# Patient Record
Sex: Female | Born: 1955
Health system: Southern US, Community
[De-identification: ages and names within clinical notes are randomized; demographics above are authoritative.]

## PROBLEM LIST (undated history)

## (undated) DIAGNOSIS — J45909 Unspecified asthma, uncomplicated: Secondary | ICD-10-CM

## (undated) DIAGNOSIS — F419 Anxiety disorder, unspecified: Secondary | ICD-10-CM

## (undated) DIAGNOSIS — T7840XA Allergy, unspecified, initial encounter: Secondary | ICD-10-CM

## (undated) DIAGNOSIS — Z6740 Type O blood, Rh positive: Secondary | ICD-10-CM

## (undated) DIAGNOSIS — I10 Essential (primary) hypertension: Secondary | ICD-10-CM

## (undated) DIAGNOSIS — U071 COVID-19: Secondary | ICD-10-CM

## (undated) DIAGNOSIS — Z8619 Personal history of other infectious and parasitic diseases: Secondary | ICD-10-CM

## (undated) DIAGNOSIS — K449 Diaphragmatic hernia without obstruction or gangrene: Secondary | ICD-10-CM

## (undated) DIAGNOSIS — Z8719 Personal history of other diseases of the digestive system: Secondary | ICD-10-CM

## (undated) DIAGNOSIS — D62 Acute posthemorrhagic anemia: Secondary | ICD-10-CM

## (undated) DIAGNOSIS — K219 Gastro-esophageal reflux disease without esophagitis: Secondary | ICD-10-CM

## (undated) DIAGNOSIS — E785 Hyperlipidemia, unspecified: Secondary | ICD-10-CM

## (undated) DIAGNOSIS — D689 Coagulation defect, unspecified: Secondary | ICD-10-CM

## (undated) DIAGNOSIS — M1990 Unspecified osteoarthritis, unspecified site: Secondary | ICD-10-CM

## (undated) DIAGNOSIS — Z9289 Personal history of other medical treatment: Secondary | ICD-10-CM

## (undated) DIAGNOSIS — K5731 Diverticulosis of large intestine without perforation or abscess with bleeding: Secondary | ICD-10-CM

## (undated) DIAGNOSIS — K5792 Diverticulitis of intestine, part unspecified, without perforation or abscess without bleeding: Secondary | ICD-10-CM

## (undated) DIAGNOSIS — D72829 Elevated white blood cell count, unspecified: Secondary | ICD-10-CM

## (undated) DIAGNOSIS — K922 Gastrointestinal hemorrhage, unspecified: Secondary | ICD-10-CM

## (undated) HISTORY — PX: BREAST BIOPSY: SHX20

## (undated) HISTORY — DX: Unspecified asthma, uncomplicated: J45.909

## (undated) HISTORY — DX: Allergy, unspecified, initial encounter: T78.40XA

## (undated) HISTORY — DX: COVID-19: U07.1

## (undated) HISTORY — DX: Diverticulitis of intestine, part unspecified, without perforation or abscess without bleeding: K57.92

## (undated) HISTORY — DX: Diverticulosis of large intestine without perforation or abscess with bleeding: K57.31

## (undated) HISTORY — DX: Anxiety disorder, unspecified: F41.9

## (undated) HISTORY — DX: Gastro-esophageal reflux disease without esophagitis: K21.9

## (undated) HISTORY — DX: Personal history of other diseases of the digestive system: Z87.19

## (undated) HISTORY — DX: Hyperlipidemia, unspecified: E78.5

## (undated) HISTORY — DX: Diaphragmatic hernia without obstruction or gangrene: K44.9

## (undated) HISTORY — PX: BREAST CYST ASPIRATION: SHX578

## (undated) HISTORY — DX: Essential (primary) hypertension: I10

## (undated) HISTORY — DX: Type O blood, Rh positive: Z67.40

## (undated) HISTORY — DX: Unspecified osteoarthritis, unspecified site: M19.90

## (undated) HISTORY — DX: Coagulation defect, unspecified: D68.9

## (undated) HISTORY — PX: BREAST SURGERY: SHX581

## (undated) HISTORY — DX: Personal history of other medical treatment: Z92.89

## (undated) HISTORY — DX: Elevated white blood cell count, unspecified: D72.829

## (undated) HISTORY — DX: Personal history of other infectious and parasitic diseases: Z86.19

## (undated) HISTORY — DX: Gastrointestinal hemorrhage, unspecified: K92.2

## (undated) HISTORY — DX: Acute posthemorrhagic anemia: D62

## (undated) HISTORY — PX: ORIF ACETABULAR FRACTURE: SHX5029

## (undated) HISTORY — PX: COLONOSCOPY: SHX174

---

## 2014-05-11 DIAGNOSIS — J301 Allergic rhinitis due to pollen: Secondary | ICD-10-CM | POA: Insufficient documentation

## 2018-02-01 ENCOUNTER — Encounter: Payer: Self-pay | Admitting: Internal Medicine

## 2018-07-07 ENCOUNTER — Ambulatory Visit: Payer: Self-pay | Admitting: Internal Medicine

## 2018-07-07 ENCOUNTER — Encounter

## 2018-08-18 ENCOUNTER — Encounter: Payer: Self-pay | Admitting: Internal Medicine

## 2018-08-18 ENCOUNTER — Ambulatory Visit (INDEPENDENT_AMBULATORY_CARE_PROVIDER_SITE_OTHER): Payer: 59 | Admitting: Internal Medicine

## 2018-08-18 VITALS — BP 128/80 | HR 98 | Temp 98.1°F | Ht 64.0 in | Wt 229.8 lb

## 2018-08-18 DIAGNOSIS — E559 Vitamin D deficiency, unspecified: Secondary | ICD-10-CM

## 2018-08-18 DIAGNOSIS — Z1329 Encounter for screening for other suspected endocrine disorder: Secondary | ICD-10-CM

## 2018-08-18 DIAGNOSIS — I10 Essential (primary) hypertension: Secondary | ICD-10-CM | POA: Insufficient documentation

## 2018-08-18 DIAGNOSIS — R739 Hyperglycemia, unspecified: Secondary | ICD-10-CM | POA: Diagnosis not present

## 2018-08-18 DIAGNOSIS — K219 Gastro-esophageal reflux disease without esophagitis: Secondary | ICD-10-CM | POA: Insufficient documentation

## 2018-08-18 DIAGNOSIS — E785 Hyperlipidemia, unspecified: Secondary | ICD-10-CM | POA: Diagnosis not present

## 2018-08-18 DIAGNOSIS — Z0184 Encounter for antibody response examination: Secondary | ICD-10-CM

## 2018-08-18 DIAGNOSIS — J4520 Mild intermittent asthma, uncomplicated: Secondary | ICD-10-CM | POA: Diagnosis not present

## 2018-08-18 DIAGNOSIS — E611 Iron deficiency: Secondary | ICD-10-CM

## 2018-08-18 DIAGNOSIS — R6889 Other general symptoms and signs: Secondary | ICD-10-CM

## 2018-08-18 DIAGNOSIS — R600 Localized edema: Secondary | ICD-10-CM

## 2018-08-18 DIAGNOSIS — Z13818 Encounter for screening for other digestive system disorders: Secondary | ICD-10-CM

## 2018-08-18 DIAGNOSIS — B370 Candidal stomatitis: Secondary | ICD-10-CM

## 2018-08-18 DIAGNOSIS — Z1231 Encounter for screening mammogram for malignant neoplasm of breast: Secondary | ICD-10-CM | POA: Diagnosis not present

## 2018-08-18 DIAGNOSIS — J029 Acute pharyngitis, unspecified: Secondary | ICD-10-CM | POA: Diagnosis not present

## 2018-08-18 LAB — POC INFLUENZA A&B (BINAX/QUICKVUE)
Influenza A, POC: NEGATIVE
Influenza B, POC: NEGATIVE

## 2018-08-18 MED ORDER — DILTIAZEM HCL ER COATED BEADS 120 MG PO CP24: 120.0000 mg | ORAL_CAPSULE | Freq: Every day | ORAL | 3 refills | Status: DC

## 2018-08-18 MED ORDER — WIXELA INHUB 100-50 MCG/DOSE IN AEPB: 1.0000 | INHALATION_SPRAY | Freq: Two times a day (BID) | RESPIRATORY_TRACT | 12 refills | Status: DC

## 2018-08-18 MED ORDER — FUROSEMIDE 20 MG PO TABS: 10.0000 mg | ORAL_TABLET | Freq: Every day | ORAL | 1 refills | Status: DC | PRN

## 2018-08-18 MED ORDER — NYSTATIN 100000 UNIT/ML MT SUSP: 5.0000 mL | Freq: Four times a day (QID) | OROMUCOSAL | 0 refills | Status: DC

## 2018-08-18 MED ORDER — OMEPRAZOLE 40 MG PO CPDR: 40.0000 mg | DELAYED_RELEASE_CAPSULE | Freq: Every day | ORAL | 3 refills | Status: DC

## 2018-08-18 MED ORDER — FLUTICASONE-SALMETEROL 100-50 MCG/DOSE IN AEPB: 1.0000 | INHALATION_SPRAY | Freq: Two times a day (BID) | RESPIRATORY_TRACT | 12 refills | Status: DC

## 2018-08-18 MED ORDER — MONTELUKAST SODIUM 10 MG PO TABS: 10.0000 mg | ORAL_TABLET | Freq: Every day | ORAL | 3 refills | Status: DC

## 2018-08-18 MED ORDER — AZITHROMYCIN 250 MG PO TABS: ORAL_TABLET | ORAL | 0 refills | Status: DC

## 2018-08-18 MED ORDER — ALBUTEROL SULFATE HFA 108 (90 BASE) MCG/ACT IN AERS: 1.0000 | INHALATION_SPRAY | Freq: Four times a day (QID) | RESPIRATORY_TRACT | 11 refills | Status: DC | PRN

## 2018-08-18 NOTE — Progress Notes (Addendum)
Chief Complaint  Patient presents with  . Establish Care   New pt with husband 1. Sore throat, ear pain since Tuesday called out of work Weds feels achy, chills no fever, dry cough coworker sick at work tried [employer] blue label otc  2. Thrush painful itching tongue forgot to rinse mouth after steroid inhalers  3. HTN/HLD on Dilt 120 mg qd controlled today. C/w leg swelling in legs at times  4. GERD/hiatal hernia, h/o GIB on ppi needs refill  5. Asthma controlled needs refill of inhaler    Review of Systems  Constitutional: Positive for chills. Negative for fever and weight loss.  HENT: Positive for ear pain and sore throat.   Eyes: Negative for blurred vision.  Respiratory: Positive for cough. Negative for sputum production.   Cardiovascular: Negative for chest pain.  Gastrointestinal: Negative for abdominal pain.  Musculoskeletal: Negative for falls.  Skin: Negative for rash.  Neurological: Negative for headaches.  Psychiatric/Behavioral: Negative for depression.   Past Medical History:  Diagnosis Date  . Arthritis   . Asthma   . Diverticulitis   . GERD (gastroesophageal reflux disease)   . History of blood transfusion   . History of chicken pox   . History of GI bleed    2013/14  . Hyperlipidemia   . Hypertension    Past Surgical History:  Procedure Laterality Date  . BREAST SURGERY     bx dense breast nl per pt   . CESAREAN SECTION     04/27/84   Family History  Problem Relation Age of Onset  . Hypertension Maternal Aunt   . Diabetes Maternal Aunt    Social History   Socioeconomic History  . Marital status: Married    Spouse name: Not on file  . Number of children: Not on file  . Years of education: Not on file  . Highest education level: Not on file  Occupational History  . Not on file  Social Needs  . Financial resource strain: Not on file  . Food insecurity:    Worry: Not on file    Inability: Not on file  . Transportation needs:    Medical: Not  on file    Non-medical: Not on file  Tobacco Use  . Smoking status: Never Smoker  . Smokeless tobacco: Never Used  Substance and Sexual Activity  . Alcohol use: Not Currently  . Drug use: Not Currently  . Sexual activity: Yes    Comment: husband   Lifestyle  . Physical activity:    Days per week: Not on file    Minutes per session: Not on file  . Stress: Not on file  Relationships  . Social connections:    Talks on phone: Not on file    Gets together: Not on file    Attends religious service: Not on file    Active member of club or organization: Not on file    Attends meetings of clubs or organizations: Not on file    Relationship status: Not on file  . Intimate partner violence:    Fear of current or ex partner: Not on file    Emotionally abused: Not on file    Physically abused: Not on file    Forced sexual activity: Not on file  Other Topics Concern  . 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 albuterol (PROVENTIL HFA;VENTOLIN HFA) 108 (90 Base) MCG/ACT inhaler Inhale 1-2 puffs into the lungs every 6 (six) hours as needed.  . diltiazem (CARDIZEM CD) 120 MG 24 hr capsule Take 1 capsule (120 mg total) by mouth daily.  . Fluticasone-Salmeterol (ADVAIR) 100-50 MCG/DOSE AEPB Inhale 1 puff into the lungs 2 (two) times daily. Rinse mouth  . montelukast (SINGULAIR) 10 MG tablet Take 1 tablet (10 mg total) by mouth at bedtime.  Marland Kitchen. omeprazole (PRILOSEC) 40 MG capsule Take 1 capsule (40 mg total) by mouth daily. 30 min before food  . WIXELA INHUB 100-50 MCG/DOSE AEPB Inhale 1 puff into the lungs 2 (two) times daily. Rinse mouth  . [DISCONTINUED] albuterol (PROVENTIL HFA;VENTOLIN HFA) 108 (90 Base) MCG/ACT inhaler 2 puffs as needed.   . [DISCONTINUED] diltiazem (CARDIZEM CD) 120 MG 24 hr capsule daily.   . [DISCONTINUED]  Fluticasone-Salmeterol (ADVAIR) 100-50 MCG/DOSE AEPB Inhale 1 puff into the lungs 2 (two) times daily.  . [DISCONTINUED] montelukast (SINGULAIR) 10 MG tablet Take 10 mg by mouth at bedtime.   . [DISCONTINUED] omeprazole (PRILOSEC) 40 MG capsule   . [DISCONTINUED] WIXELA INHUB 100-50 MCG/DOSE AEPB Inhale 1 puff into the lungs 2 (two) times daily.    Allergies  Allergen Reactions  . Oxycodone Other (See Comments)   Recent Results (from the past 2160 hour(s))  POC Influenza A&B(BINAX/QUICKVUE)     Status: None   Collection Time: 08/18/18 11:27 AM  Result Value Ref Range   Influenza A, POC Negative Negative   Influenza B, POC Negative Negative   Objective  Body mass index is 39.45 kg/m. Wt Readings from Last 3 Encounters:  08/18/18 229 lb 12.8 oz (104.2 kg)   Temp Readings from Last 3 Encounters:  08/18/18 98.1 F (36.7 C) (Oral)   BP Readings from Last 3 Encounters:  08/18/18 128/80   Pulse Readings from Last 3 Encounters:  08/18/18 98    Physical Exam Vitals signs and nursing note reviewed.  Constitutional:      Appearance: Normal appearance. She is well-developed and well-groomed.  HENT:     Head: Normocephalic and atraumatic.     Nose: Nose normal.     Mouth/Throat:     Mouth: Mucous membranes are moist.     Pharynx: Oropharynx is clear.  Eyes:     Conjunctiva/sclera: Conjunctivae normal.     Pupils: Pupils are equal, round, and reactive to light.  Cardiovascular:     Rate and Rhythm: Normal rate and regular rhythm.     Heart sounds: Normal heart sounds.  Pulmonary:     Effort: Pulmonary effort is normal.     Breath sounds: Normal breath sounds.  Skin:    General: Skin is warm and dry.  Neurological:     General: No focal deficit present.     Mental Status: She is alert and oriented to person, place, and time. Mental status is at baseline.     Gait: Gait normal.  Psychiatric:        Attention and Perception: Attention and perception normal.        Mood and  Affect: Mood and affect normal.        Speech: Speech normal.        Behavior: Behavior normal. Behavior is cooperative.        Thought Content: Thought content normal.        Cognition and Memory: Cognition  and memory normal.        Judgment: Judgment normal.     Assessment   1. Sore throat, ear pain ? Viral vs bacterial flu negative today  2. Thrush with steroid inhaler use  3. HTN/HLD  4. GERD/hiatal hernia, h/o GIB 5. HM 6. Asthma controlled  Plan   1. Supportive care  Trial of Zpack  Warm salt gargles  rec claritin for pnd otc, mucinex dm  Note for work x 3 days.  2. Nystatin mouthwash  3. Cont meds  Add lasix 10 mg qd prn trace edema legs  4. Refilled meds  Get copy of GI records  5.  Flu shot had 2019 [place] in [place] Dr. Nedra HaiLee  [place]  [place]  Consider shingrix in future   [place] [facility] [place] but ordered mammo 02/2019  Colonoscopy get [place] obtained:  colonoscopy 10/15/06 mod to severe diverticulosis [facility] f/u in 5 years  EGD 11/29/07 candida, mild gastritis  EGD 01/03/13/colonoscopy diverticulosis ext and int hemorrhoids TI with ulceration neg H pylori UGD 02/01/18 gastritis   Pap get records [place] DEXA consider age 63  6. Refilled meds rec add claritin qhs prn   [facility] H/o TI ulceration on colonoscopy  H/o gastritis/duodenitis  GERD Diverticulosis  CAD-she was on lipitor 10 mg at 1 time HTN  Anxiety was on xanax 0.25 qid prn  H/o gallstones  Fibroids  Asthma H/o D&C, vaginal myomectomy  46 Endometrial bx, bening 05/21/2009 Knee tib/fib srews 1999  C section 04/25/1984  H/o left breat bx aspiration of cyst   Provider: Dr. French Anaracy [name]

## 2018-08-18 NOTE — Progress Notes (Signed)
Pre visit review using our clinic review tool, if applicable. No additional management support is needed unless otherwise documented below in the visit note. 

## 2018-08-18 NOTE — Patient Instructions (Signed)
Sore Throat  A sore throat is pain, burning, irritation, or scratchiness in the throat. When you have a sore throat, you may feel pain or tenderness in your throat when you swallow or talk.  Many things can cause a sore throat, including:   An infection.   Seasonal allergies.   Dryness in the air.   Irritants, such as smoke or pollution.   Radiation treatment to the area.   Gastroesophageal reflux disease (GERD).   A tumor.  A sore throat is often the first sign of another sickness. It may happen with other symptoms, such as coughing, sneezing, fever, and swollen neck glands. Most sore throats go away without medical treatment.  Follow these instructions at home:          Take over-the-counter medicines only as told by your health care provider.  ? If your child has a sore throat, do not give your child aspirin because of the association with Reye syndrome.   Drink enough fluids to keep your urine pale yellow.   Rest as needed.   To help with pain, try:  ? Sipping warm liquids, such as broth, herbal tea, or warm water.  ? Eating or drinking cold or frozen liquids, such as frozen ice pops.  ? Gargling with a salt-water mixture 3-4 times a day or as needed. To make a salt-water mixture, completely dissolve -1 tsp (3-6 g) of salt in 1 cup (237 mL) of warm water.  ? Sucking on hard candy or throat lozenges.  ? Putting a cool-mist humidifier in your bedroom at night to moisten the air.  ? Sitting in the bathroom with the door closed for 5-10 minutes while you run hot water in the shower.   Do not use any products that contain nicotine or tobacco, such as cigarettes, e-cigarettes, and chewing tobacco. If you need help quitting, ask your health care provider.   Wash your hands well and often with soap and water. If soap and water are not available, use hand sanitizer.  Contact a health care provider if:   You have a fever for more than 2-3 days.   You have symptoms that last (are persistent) for more than  2-3 days.   Your throat does not get better within 7 days.   You have a fever and your symptoms suddenly get worse.   Your child who is 3 months to 3 years old has a temperature of 102.2F (39C) or higher.  Get help right away if:   You have difficulty breathing.   You cannot swallow fluids, soft foods, or your saliva.   You have increased swelling in your throat or neck.   You have persistent nausea and vomiting.  Summary   A sore throat is pain, burning, irritation, or scratchiness in the throat. Many things can cause a sore throat.   Take over-the-counter medicines only as told by your health care provider. Do not give your child aspirin.   Drink plenty of fluids, and rest as needed.   Contact a health care provider if your symptoms worsen or your sore throat does not get better within 7 days.  This information is not intended to replace advice given to you by your health care provider. Make sure you discuss any questions you have with your health care provider.  Document Released: 08/13/2004 Document Revised: 12/06/2017 Document Reviewed: 12/06/2017  Elsevier Interactive Patient Education  2019 Elsevier Inc.

## 2018-08-23 ENCOUNTER — Encounter: Payer: Self-pay | Admitting: Internal Medicine

## 2018-08-29 ENCOUNTER — Other Ambulatory Visit (INDEPENDENT_AMBULATORY_CARE_PROVIDER_SITE_OTHER): Payer: 59

## 2018-08-29 DIAGNOSIS — R739 Hyperglycemia, unspecified: Secondary | ICD-10-CM

## 2018-08-29 DIAGNOSIS — E559 Vitamin D deficiency, unspecified: Secondary | ICD-10-CM

## 2018-08-29 DIAGNOSIS — Z1329 Encounter for screening for other suspected endocrine disorder: Secondary | ICD-10-CM | POA: Diagnosis not present

## 2018-08-29 DIAGNOSIS — E785 Hyperlipidemia, unspecified: Secondary | ICD-10-CM

## 2018-08-29 DIAGNOSIS — Z13818 Encounter for screening for other digestive system disorders: Secondary | ICD-10-CM

## 2018-08-29 DIAGNOSIS — I10 Essential (primary) hypertension: Secondary | ICD-10-CM

## 2018-08-29 DIAGNOSIS — E611 Iron deficiency: Secondary | ICD-10-CM

## 2018-08-29 DIAGNOSIS — Z0184 Encounter for antibody response examination: Secondary | ICD-10-CM

## 2018-08-29 LAB — COMPREHENSIVE METABOLIC PANEL WITH GFR
ALT: 13 U/L (ref 0–35)
AST: 13 U/L (ref 0–37)
Albumin: 4.1 g/dL (ref 3.5–5.2)
Alkaline Phosphatase: 63 U/L (ref 39–117)
BUN: 12 mg/dL (ref 6–23)
CO2: 27 meq/L (ref 19–32)
Calcium: 9.5 mg/dL (ref 8.4–10.5)
Chloride: 105 meq/L (ref 96–112)
Creatinine, Ser: 0.85 mg/dL (ref 0.40–1.20)
GFR: 67.31 mL/min
Glucose, Bld: 90 mg/dL (ref 70–99)
Potassium: 3.6 meq/L (ref 3.5–5.1)
Sodium: 140 meq/L (ref 135–145)
Total Bilirubin: 0.4 mg/dL (ref 0.2–1.2)
Total Protein: 8 g/dL (ref 6.0–8.3)

## 2018-08-29 LAB — CBC WITH DIFFERENTIAL/PLATELET
Basophils Absolute: 0.1 K/uL (ref 0.0–0.1)
Basophils Relative: 0.6 % (ref 0.0–3.0)
Eosinophils Absolute: 0.3 K/uL (ref 0.0–0.7)
Eosinophils Relative: 2.6 % (ref 0.0–5.0)
HCT: 41.4 % (ref 36.0–46.0)
Hemoglobin: 13.8 g/dL (ref 12.0–15.0)
Lymphocytes Relative: 40.7 % (ref 12.0–46.0)
Lymphs Abs: 4 K/uL (ref 0.7–4.0)
MCHC: 33.3 g/dL (ref 30.0–36.0)
MCV: 78.8 fl (ref 78.0–100.0)
Monocytes Absolute: 0.7 K/uL (ref 0.1–1.0)
Monocytes Relative: 7.3 % (ref 3.0–12.0)
Neutro Abs: 4.8 K/uL (ref 1.4–7.7)
Neutrophils Relative %: 48.8 % (ref 43.0–77.0)
Platelets: 252 K/uL (ref 150.0–400.0)
RBC: 5.26 Mil/uL — ABNORMAL HIGH (ref 3.87–5.11)
RDW: 16.8 % — ABNORMAL HIGH (ref 11.5–15.5)
WBC: 9.8 K/uL (ref 4.0–10.5)

## 2018-08-29 LAB — LIPID PANEL
Cholesterol: 207 mg/dL — ABNORMAL HIGH (ref 0–200)
HDL: 55.8 mg/dL
LDL Cholesterol: 134 mg/dL — ABNORMAL HIGH (ref 0–99)
NonHDL: 150.76
Total CHOL/HDL Ratio: 4
Triglycerides: 82 mg/dL (ref 0.0–149.0)
VLDL: 16.4 mg/dL (ref 0.0–40.0)

## 2018-08-29 LAB — VITAMIN D 25 HYDROXY (VIT D DEFICIENCY, FRACTURES): VITD: 29.96 ng/mL — ABNORMAL LOW (ref 30.00–100.00)

## 2018-08-29 LAB — HEMOGLOBIN A1C: Hgb A1c MFr Bld: 5.7 % (ref 4.6–6.5)

## 2018-08-29 LAB — TSH: TSH: 1.92 u[IU]/mL (ref 0.35–4.50)

## 2018-08-29 NOTE — Addendum Note (Signed)
Addended by: Penne Lash on: 08/29/2018 08:48 AM   Modules accepted: Orders

## 2018-08-30 ENCOUNTER — Other Ambulatory Visit: Payer: 59

## 2018-08-30 LAB — URINALYSIS, ROUTINE W REFLEX MICROSCOPIC
Bilirubin, UA: NEGATIVE
Glucose, UA: NEGATIVE
Ketones, UA: NEGATIVE
Leukocytes, UA: NEGATIVE
Nitrite, UA: NEGATIVE
RBC, UA: NEGATIVE
Specific Gravity, UA: 1.015 (ref 1.005–1.030)
Urobilinogen, Ur: 0.2 mg/dL (ref 0.2–1.0)
pH, UA: 6.5 (ref 5.0–7.5)

## 2018-08-30 LAB — MEASLES/MUMPS/RUBELLA IMMUNITY
Mumps IgG: 59.4 [AU]/ml
Rubella: 10 {index}
Rubeola IgG: >300 [AU]/ml

## 2018-08-30 LAB — MICROSCOPIC EXAMINATION
Bacteria, UA: NONE SEEN
Casts: NONE SEEN /LPF

## 2018-08-30 LAB — HEPATITIS C ANTIBODY
Hepatitis C Ab: NONREACTIVE
SIGNAL TO CUT-OFF: 0.07

## 2018-08-30 LAB — HEPATITIS B SURFACE ANTIBODY, QUANTITATIVE: Hep B S AB Quant (Post): >1000 m[IU]/mL

## 2018-08-30 LAB — IRON,TIBC AND FERRITIN PANEL
%SAT: 17 % (ref 16–45)
Ferritin: 66 ng/mL (ref 16–288)
Iron: 56 ug/dL (ref 45–160)
TIBC: 330 ug/dL (ref 250–450)

## 2018-08-31 ENCOUNTER — Other Ambulatory Visit: Payer: 59

## 2018-09-26 ENCOUNTER — Ambulatory Visit: Payer: 59 | Admitting: Family Medicine

## 2018-10-06 ENCOUNTER — Encounter: Payer: Self-pay | Admitting: Internal Medicine

## 2018-10-06 ENCOUNTER — Ambulatory Visit (INDEPENDENT_AMBULATORY_CARE_PROVIDER_SITE_OTHER): Payer: 59 | Admitting: Internal Medicine

## 2018-10-06 ENCOUNTER — Ambulatory Visit (INDEPENDENT_AMBULATORY_CARE_PROVIDER_SITE_OTHER): Payer: 59

## 2018-10-06 ENCOUNTER — Other Ambulatory Visit: Payer: Self-pay

## 2018-10-06 VITALS — BP 134/66 | HR 95 | Temp 98.4°F | Ht 64.0 in | Wt 230.2 lb

## 2018-10-06 DIAGNOSIS — R062 Wheezing: Secondary | ICD-10-CM | POA: Diagnosis not present

## 2018-10-06 DIAGNOSIS — E559 Vitamin D deficiency, unspecified: Secondary | ICD-10-CM | POA: Diagnosis not present

## 2018-10-06 DIAGNOSIS — R05 Cough: Secondary | ICD-10-CM

## 2018-10-06 DIAGNOSIS — E785 Hyperlipidemia, unspecified: Secondary | ICD-10-CM

## 2018-10-06 DIAGNOSIS — R7303 Prediabetes: Secondary | ICD-10-CM | POA: Diagnosis not present

## 2018-10-06 DIAGNOSIS — R059 Cough, unspecified: Secondary | ICD-10-CM

## 2018-10-06 DIAGNOSIS — J45998 Other asthma: Secondary | ICD-10-CM | POA: Diagnosis not present

## 2018-10-06 NOTE — Patient Instructions (Addendum)
Mucinex DM green label  Sugar free cough drops  Try Breo and for now Stop Wixela 100/50  If above does not help we can try to increase Advair to 250/50 and add spiriva to control your symptoms   Asthma, Adult  Asthma is a long-term (chronic) condition that causes recurrent episodes in which the airways become tight and narrow. The airways are the passages that lead from the nose and mouth down into the lungs. Asthma episodes, also called asthma attacks, can cause coughing, wheezing, shortness of breath, and chest pain. The airways can also fill with mucus. During an attack, it can be difficult to breathe. Asthma attacks can range from minor to life threatening. Asthma cannot be cured, but medicines and lifestyle changes can help control it and treat acute attacks. What are the causes? This condition is believed to be caused by inherited (genetic) and environmental factors, but its exact cause is not known. There are many things that can bring on an asthma attack or make asthma symptoms worse (triggers). Asthma triggers are different for each person. Common triggers include:  Mold.  Dust.  Cigarette smoke.  Cockroaches.  Things that can cause allergy symptoms (allergens), such as animal dander or pollen from trees or grass.  Air pollutants such as household cleaners, wood smoke, smog, or Therapist, occupational.  Cold air, weather changes, and winds (which increase molds and pollen in the air).  Strong emotional expressions such as crying or laughing hard.  Stress.  Certain medicines (such as aspirin) or types of medicines (such as beta-blockers).  Sulfites in foods and drinks. Foods and drinks that may contain sulfites include dried fruit, potato chips, and sparkling grape juice.  Infections or inflammatory conditions such as the flu, a cold, or inflammation of the nasal membranes (rhinitis).  Gastroesophageal reflux disease (GERD).  Exercise or strenuous activity. What are the signs  or symptoms? Symptoms of this condition may occur right after asthma is triggered or many hours later. Symptoms include:  Wheezing. This can sound like whistling when you breathe.  Excessive nighttime or early morning coughing.  Frequent or severe coughing with a common cold.  Chest tightness.  Shortness of breath.  Tiredness (fatigue) with minimal activity. How is this diagnosed? This condition is diagnosed based on:  Your medical history.  A physical exam.  Tests, which may include: ? Lung function studies and pulmonary studies (spirometry). These tests can evaluate the flow of air in your lungs. ? Allergy tests. ? Imaging tests, such as X-rays. How is this treated? There is no cure for this condition, but treatment can help control your symptoms. Treatment for asthma usually involves:  Identifying and avoiding your asthma triggers.  Using medicines to control your symptoms. Generally, two types of medicines are used to treat asthma: ? Controller medicines. These help prevent asthma symptoms from occurring. They are usually taken every day. ? Fast-acting reliever or rescue medicines. These quickly relieve asthma symptoms by widening the narrow and tight airways. They are used as needed and provide short-term relief.  Using supplemental oxygen. This may be needed during a severe episode.  Using other medicines, such as: ? Allergy medicines, such as antihistamines, if your asthma attacks are triggered by allergens. ? Immune medicines (immunomodulators). These are medicines that help control the immune system.  Creating an asthma action plan. An asthma action plan is a written plan for managing and treating your asthma attacks. This plan includes: ? A list of your asthma triggers and how to  avoid them. ? Information about when medicines should be taken and when their dosage should be changed. ? Instructions about using a device called a peak flow meter. A peak flow meter  measures how well the lungs are working and the severity of your asthma. It helps you monitor your condition. Follow these instructions at home: Controlling your home environment Control your home environment in the following ways to help avoid triggers and prevent asthma attacks:  Change your heating and air conditioning filter regularly.  Limit your use of fireplaces and wood stoves.  Get rid of pests (such as roaches and mice) and their droppings.  Throw away plants if you see mold on them.  Clean floors and dust surfaces regularly. Use unscented cleaning products.  Try to have someone else vacuum for you regularly. Stay out of rooms while they are being vacuumed and for a short while afterward. If you vacuum, use a dust mask from a hardware store, a double-layered or microfilter vacuum cleaner bag, or a vacuum cleaner with a HEPA filter.  Replace carpet with wood, tile, or vinyl flooring. Carpet can trap dander and dust.  Use allergy-proof pillows, mattress covers, and box spring covers.  Keep your bedroom a trigger-free room.  Avoid pets and keep windows closed when allergens are in the air.  Wash beddings every week in hot water and dry them in a dryer.  Use blankets that are made of polyester or cotton.  Clean bathrooms and kitchens with bleach. If possible, have someone repaint the walls in these rooms with mold-resistant paint. Stay out of the rooms that are being cleaned and painted.  Wash your hands often with soap and water. If soap and water are not available, use hand sanitizer.  Do not allow anyone to smoke in your home. General instructions  Take over-the-counter and prescription medicines only as told by your health care provider. ? Speak with your health care provider if you have questions about how or when to take the medicines. ? Make note if you are requiring more frequent dosages.  Do not use any products that contain nicotine or tobacco, such as  cigarettes and e-cigarettes. If you need help quitting, ask your health care provider. Also, avoid being exposed to secondhand smoke.  Use a peak flow meter as told by your health care provider. Record and keep track of the readings.  Understand and use the asthma action plan to help minimize, or stop an asthma attack, without needing to seek medical care.  Make sure you stay up to date on your yearly vaccinations as told by your health care provider. This may include vaccines for the flu and pneumonia.  Avoid outdoor activities when allergen counts are high and when air quality is low.  Wear a ski mask that covers your nose and mouth during outdoor winter activities. Exercise indoors on cold days if you can.  Warm up before exercising, and take time for a cool-down period after exercise.  Keep all follow-up visits as told by your health care provider. This is important. Where to find more information  For information about asthma, turn to the Centers for Disease Control and Prevention at http://www.mills-berg.com/.htm  For air quality information, turn to [employer] at [employer] Contact a health care provider if:  You have wheezing, shortness of breath, or a cough even while you are taking medicine to prevent attacks.  The mucus you cough up (sputum) is thicker than usual.  Your sputum changes from clear or white  to yellow, green, gray, or bloody.  Your medicines are causing side effects, such as a rash, itching, swelling, or trouble breathing.  You need to use a reliever medicine more than 2-3 times a week.  Your peak flow reading is still at 50-79% of your personal best after following your action plan for 1 hour.  You have a fever. Get help right away if:  You are getting worse and do not respond to treatment during an asthma attack.  You are short of breath when at rest or when doing very little physical activity.  You have difficulty eating, drinking, or  talking.  You have chest pain or tightness.  You develop a fast heartbeat or palpitations.  You have a bluish color to your lips or fingernails.  You are light-headed or dizzy, or you faint.  Your peak flow reading is less than 50% of your personal best.  You feel too tired to breathe normally. Summary  Asthma is a long-term (chronic) condition that causes recurrent episodes in which the airways become tight and narrow. These episodes can cause coughing, wheezing, shortness of breath, and chest pain.  Asthma cannot be cured, but medicines and lifestyle changes can help control it and treat acute attacks.  Make sure you understand how to avoid triggers and how and when to use your medicines.  Asthma attacks can range from minor to life threatening. Get help right away if you have an asthma attack and do not respond to treatment with your usual rescue medicines. This information is not intended to replace advice given to you by your health care provider. Make sure you discuss any questions you have with your health care provider. Document Released: 07/06/2005 Document Revised: 08/10/2016 Document Reviewed: 08/10/2016 Elsevier Interactive Patient Education  2019 Elsevier Inc.   Cough, Adult  Coughing is a reflex that clears your throat and your airways. Coughing helps to heal and protect your lungs. It is normal to cough occasionally, but a cough that happens with other symptoms or lasts a long time may be a sign of a condition that needs treatment. A cough may last only 2-3 weeks (acute), or it may last longer than 8 weeks (chronic). What are the causes? Coughing is commonly caused by:  Breathing in substances that irritate your lungs.  A viral or bacterial respiratory infection.  Allergies.  Asthma.  Postnasal drip.  Smoking.  Acid backing up from the stomach into the esophagus (gastroesophageal reflux).  Certain medicines.  Chronic lung problems, including COPD (or  rarely, lung cancer).  Other medical conditions such as heart failure. Follow these instructions at home: Pay attention to any changes in your symptoms. Take these actions to help with your discomfort:  Take medicines only as told by your health care provider. ? If you were prescribed an antibiotic medicine, take it as told by your health care provider. Do not stop taking the antibiotic even if you start to feel better. ? Talk with your health care provider before you take a cough suppressant medicine.  Drink enough fluid to keep your urine clear or pale yellow.  If the air is dry, use a cold steam vaporizer or humidifier in your bedroom or your home to help loosen secretions.  Avoid anything that causes you to cough at work or at home.  If your cough is worse at night, try sleeping in a semi-upright position.  Avoid cigarette smoke. If you smoke, quit smoking. If you need help quitting, ask your health care  provider.  Avoid caffeine.  Avoid alcohol.  Rest as needed. Contact a health care provider if:  You have new symptoms.  You cough up pus.  Your cough does not get better after 2-3 weeks, or your cough gets worse.  You cannot control your cough with suppressant medicines and you are losing sleep.  You develop pain that is getting worse or pain that is not controlled with pain medicines.  You have a fever.  You have unexplained weight loss.  You have night sweats. Get help right away if:  You cough up blood.  You have difficulty breathing.  Your heartbeat is very fast. This information is not intended to replace advice given to you by your health care provider. Make sure you discuss any questions you have with your health care provider. Document Released: 01/02/2011 Document Revised: 12/12/2015 Document Reviewed: 09/12/2014 Elsevier Interactive Patient Education  2019 [employer].

## 2018-10-06 NOTE — Progress Notes (Signed)
Pre visit review using our clinic review tool, if applicable. No additional management support is needed unless otherwise documented below in the visit note. 

## 2018-10-06 NOTE — Progress Notes (Signed)
No chief complaint on file.  Presents for f/u with husband today   1. C/o cough with phelgm clear thick phelgm productive at times c/o chest pressure. Cough is worse at night and wheezing at night and cough makes wheezing go away. She did have aches and saw [facility] walk in on 09/26/2018 flu was negative though she was given Tamiflu body aches better and no fever or sob. She is not on Advair 100/50 she is on Wixela due to insurance will not cover this for asthma and prn Albuterol inhaler. Also on singulair and c/o PND   2. Reviewed labs vitamin D def, HLD, prediabetes A1c 5.7     Review of Systems  Constitutional: Negative for weight loss.  HENT: Negative for hearing loss.   Eyes: Negative for blurred vision.  Respiratory: Negative for shortness of breath.   Cardiovascular: Negative for chest pain.  Gastrointestinal: Negative for abdominal pain.  Skin: Negative for rash.  Neurological: Negative for headaches.   Past Medical History:  Diagnosis Date  . Arthritis   . Asthma   . Diverticulitis   . GERD (gastroesophageal reflux disease)   . Hiatal hernia   . History of blood transfusion   . History of chicken pox   . History of GI bleed    2013/14  . Hyperlipidemia   . Hypertension    Past Surgical History:  Procedure Laterality Date  . BREAST SURGERY     bx dense breast nl per pt   . CESAREAN SECTION     04/27/84   Family History  Problem Relation Age of Onset  . Hypertension Maternal Aunt   . Diabetes Maternal Aunt    Social History   Socioeconomic History  . Marital status: Married    Spouse name: Not on file  . Number of children: Not on file  . Years of education: Not on file  . Highest education level: Not on file  Occupational History  . Not on file  Social Needs  . Financial resource strain: Not on file  . Food insecurity:    Worry: Not on file    Inability: Not on file  . Transportation needs:    Medical: Not on file    Non-medical: Not on file  Tobacco Use   . Smoking status: Never Smoker  . Smokeless tobacco: Never Used  Substance and Sexual Activity  . Alcohol use: Not Currently  . Drug use: Not Currently  . Sexual activity: Yes    Comment: husband   Lifestyle  . Physical activity:    Days per week: Not on file    Minutes per session: Not on file  . Stress: Not on file  Relationships  . Social connections:    Talks on phone: Not on file    Gets together: Not on file    Attends religious service: Not on file    Active member of club or organization: Not on file    Attends meetings of clubs or organizations: Not on file    Relationship status: Not on file  . Intimate partner violence:    Fear of current or ex partner: Not on file    Emotionally abused: Not on file    Physically abused: Not on file    Forced sexual activity: Not on file  Other Topics Concern  . Not on file  Social History Narrative   Lived in [place] from [place]    Married    2 sons  RN   No guns, wears seat belt, safe in relationship    No outpatient medications have been marked as taking for the 10/06/18 encounter (Appointment) with [name], [name], MD.   Allergies  Allergen Reactions  . Oxycodone Other (See Comments)   Recent Results (from the past 2160 hour(s))  POC Influenza A&B(BINAX/QUICKVUE)     Status: None   Collection Time: 08/18/18 11:27 AM  Result Value Ref Range   Influenza A, POC Negative Negative   Influenza B, POC Negative Negative  Urinalysis, Routine w reflex microscopic     Status: Abnormal   Collection Time: 08/29/18  8:48 AM  Result Value Ref Range   Specific Gravity, UA 1.015 1.005 - 1.030   pH, UA 6.5 5.0 - 7.5   Color, UA Yellow Yellow   Appearance Ur Clear Clear   Leukocytes, UA Negative Negative   Protein, UA 1+ (A) Negative/Trace   Glucose, UA Negative Negative   Ketones, UA Negative Negative   RBC, UA Negative Negative   Bilirubin, UA Negative Negative   Urobilinogen, Ur 0.2 0.2 - 1.0 mg/dL   Nitrite,  UA Negative Negative   Microscopic Examination See below:     Comment: Microscopic was indicated and was performed.  Iron, TIBC and Ferritin Panel     Status: None   Collection Time: 08/29/18  8:48 AM  Result Value Ref Range   Iron 56 45 - 160 mcg/dL   TIBC 168 372 - 902 mcg/dL (calc)   %SAT 17 16 - 45 % (calc)   Ferritin 66 16 - 288 ng/mL  Measles/Mumps/Rubella Immunity     Status: None   Collection Time: 08/29/18  8:48 AM  Result Value Ref Range   Rubeola IgG >300.00 AU/mL    Comment: AU/mL            Interpretation -----            -------------- <13.50           Negative 13.50-16.49      Equivocal >16.49           Positive . A positive result indicates that the patient has antibody to measles virus. It does not differentiate  between an active or past infection. The clinical  diagnosis must be interpreted in conjunction with  clinical signs and symptoms of the patient.    Mumps IgG 59.40 AU/mL    Comment:  AU/mL           Interpretation -------         ---------------- <9.00             Negative 9.00-10.99        Equivocal >10.99            Positive A positive result indicates that the patient has  antibody to mumps virus. It does not differentiate between an  active or past infection. The clinical diagnosis must be interpreted in conjunction with clinical signs and symptoms of the patient. .    Rubella 10.00 index    Comment:     Index            Interpretation     -----            --------------       <0.90            Not consistent with Immunity     0.90-0.99        Equivocal     > or =  1.00      Consistent with Immunity  . The presence of rubella IgG antibody suggests  immunization or past or current infection with rubella virus.   Hepatitis B surface antibody,quantitative     Status: None   Collection Time: 08/29/18  8:48 AM  Result Value Ref Range   Hepatitis B-Post >1,000 > OR = 10 mIU/mL    Comment: . Patient has immunity to hepatitis B  virus. . For additional information, please refer to http://education.[facility].com/faq/FAQ105 (This link is being provided for informational/ educational purposes only).   Hepatitis C antibody     Status: None   Collection Time: 08/29/18  8:48 AM  Result Value Ref Range   Hepatitis C Ab NON-REACTIVE NON-REACTI   SIGNAL TO CUT-OFF 0.07 <1.00    Comment: . HCV antibody was non-reactive. There is no laboratory  evidence of HCV infection. . In most cases, no further action is required. However, if recent HCV exposure is suspected, a test for HCV RNA (test code 00762) is suggested. . For additional information please refer to http://education.[facility].com/faq/FAQ22v1 (This link is being provided for informational/ educational purposes only.) .   Vitamin D (25 hydroxy)     Status: Abnormal   Collection Time: 08/29/18  8:48 AM  Result Value Ref Range   VITD 29.96 (L) 30.00 - 100.00 ng/mL  Hemoglobin A1c     Status: None   Collection Time: 08/29/18  8:48 AM  Result Value Ref Range   Hgb A1c MFr Bld 5.7 4.6 - 6.5 %    Comment: Glycemic Control Guidelines for People with Diabetes:Non Diabetic:  <6%Goal of Therapy: <7%Additional Action Suggested:  >8%   TSH     Status: None   Collection Time: 08/29/18  8:48 AM  Result Value Ref Range   TSH 1.92 0.35 - 4.50 uIU/mL  Lipid panel     Status: Abnormal   Collection Time: 08/29/18  8:48 AM  Result Value Ref Range   Cholesterol 207 (H) 0 - 200 mg/dL    Comment: ATP III Classification       Desirable:  < 200 mg/dL               Borderline High:  200 - 239 mg/dL          High:  > = 263 mg/dL   Triglycerides 33.5 0.0 - 149.0 mg/dL    Comment: Normal:  <456 mg/dLBorderline High:  150 - 199 mg/dL   HDL 25.63 >89.37 mg/dL   VLDL 34.2 0.0 - 87.6 mg/dL   LDL Cholesterol 811 (H) 0 - 99 mg/dL   Total CHOL/HDL Ratio 4     Comment:                Men          Women1/2 Average Risk     3.4          3.3Average Risk          5.0           4.42X Average Risk          9.6          7.13X Average Risk          15.0          11.0                       NonHDL 150.76     Comment: NOTE:  Non-HDL goal should be 30  mg/dL higher than patient's LDL goal (i.e. LDL goal of < 70 mg/dL, would have non-HDL goal of < 100 mg/dL)  CBC with Differential/Platelet     Status: Abnormal   Collection Time: 08/29/18  8:48 AM  Result Value Ref Range   WBC 9.8 4.0 - 10.5 K/uL   RBC 5.26 (H) 3.87 - 5.11 Mil/uL   Hemoglobin 13.8 12.0 - 15.0 g/dL   HCT 16.141.4 09.636.0 - 04.546.0 %   MCV 78.8 78.0 - 100.0 fl   MCHC 33.3 30.0 - 36.0 g/dL   RDW 40.916.8 (H) 81.111.5 - 91.415.5 %   Platelets 252.0 150.0 - 400.0 K/uL   Neutrophils Relative % 48.8 43.0 - 77.0 %   Lymphocytes Relative 40.7 12.0 - 46.0 %   Monocytes Relative 7.3 3.0 - 12.0 %   Eosinophils Relative 2.6 0.0 - 5.0 %   Basophils Relative 0.6 0.0 - 3.0 %   Neutro Abs 4.8 1.4 - 7.7 K/uL   Lymphs Abs 4.0 0.7 - 4.0 K/uL   Monocytes Absolute 0.7 0.1 - 1.0 K/uL   Eosinophils Absolute 0.3 0.0 - 0.7 K/uL   Basophils Absolute 0.1 0.0 - 0.1 K/uL  Comprehensive metabolic panel     Status: None   Collection Time: 08/29/18  8:48 AM  Result Value Ref Range   Sodium 140 135 - 145 mEq/L   Potassium 3.6 3.5 - 5.1 mEq/L   Chloride 105 96 - 112 mEq/L   CO2 27 19 - 32 mEq/L   Glucose, Bld 90 70 - 99 mg/dL   BUN 12 6 - 23 mg/dL   Creatinine, Ser 7.820.85 0.40 - 1.20 mg/dL   Total Bilirubin 0.4 0.2 - 1.2 mg/dL   Alkaline Phosphatase 63 39 - 117 U/L   AST 13 0 - 37 U/L   ALT 13 0 - 35 U/L   Total Protein 8.0 6.0 - 8.3 g/dL   Albumin 4.1 3.5 - 5.2 g/dL   Calcium 9.5 8.4 - 95.610.5 mg/dL   GFR 21.3067.31 >86.57>60.00 mL/min  Microscopic Examination     Status: None   Collection Time: 08/29/18  8:48 AM  Result Value Ref Range   WBC, UA 0-5 0 - 5 /hpf   RBC, UA 0-2 0 - 2 /hpf   Epithelial Cells (non renal) 0-10 0 - 10 /hpf   Casts None seen None seen /lpf   Mucus, UA Present Not Estab.   Bacteria, UA None seen None seen/Few   Objective   There is no height or weight on file to calculate BMI. Wt Readings from Last 3 Encounters:  08/18/18 229 lb 12.8 oz (104.2 kg)   Temp Readings from Last 3 Encounters:  08/18/18 98.1 F (36.7 C) (Oral)   BP Readings from Last 3 Encounters:  08/18/18 128/80   Pulse Readings from Last 3 Encounters:  08/18/18 98    Physical Exam Vitals signs and nursing note reviewed.  Constitutional:      Appearance: Normal appearance. She is well-developed and well-groomed.  HENT:     Head: Normocephalic and atraumatic.     Nose: Nose normal.     Mouth/Throat:     Mouth: Mucous membranes are moist.     Pharynx: Oropharynx is clear.  Eyes:     Conjunctiva/sclera: Conjunctivae normal.     Pupils: Pupils are equal, round, and reactive to light.  Cardiovascular:     Rate and Rhythm: Normal rate and regular rhythm.     Heart sounds: Normal heart sounds. No murmur.  Pulmonary:     Effort: Pulmonary effort is normal.     Breath sounds: Normal breath sounds. No wheezing.  Skin:    General: Skin is warm and dry.  Neurological:     General: No focal deficit present.     Mental Status: She is alert and oriented to person, place, and time. Mental status is at baseline.     Gait: Gait normal.  Psychiatric:        Attention and Perception: Attention and perception normal.        Mood and Affect: Mood and affect normal.        Speech: Speech normal.        Behavior: Behavior normal. Behavior is cooperative.        Thought Content: Thought content normal.        Cognition and Memory: Cognition and memory normal.        Judgment: Judgment normal.     Assessment   1. Asthma with PND with persistent sx's cough and wheezing worse at night  2. HLD 3. Vitamin D def 4. prediabetes  5. HM Plan   1. CXR today  Stop Wixela  Given sample of breo 100/25  -if does not help consider increase advair to 250/50 and add spiriva  On claritin  Consider astepro nasal spray as well for allergies  2. Given  cholesterol handout  3. On otc D3 1000-2000 IU qd  4. rec healthy diet and exercise  5.  Flu shot had 2019 PCP in [place] Dr. Nedra Hai  [place]  [place]  Consider shingrix in future   Get records [place] but ordered mammo 02/2019  Colonoscopy get records [place] obtained:  colonoscopy 10/15/06 mod to severe diverticulosis [facility] f/u in 5 years  EGD 11/29/07 candida, mild gastritis  EGD 01/03/13/colonoscopy diverticulosis ext and int hemorrhoids TI with ulceration neg H pylori UGD 02/01/18 gastritis   Pap get records [place] DEXA consider age 48 Provider: Dr. French Ana [name]-Internal Medicine

## 2018-10-07 ENCOUNTER — Other Ambulatory Visit: Payer: Self-pay | Admitting: Internal Medicine

## 2018-10-07 DIAGNOSIS — J4530 Mild persistent asthma, uncomplicated: Secondary | ICD-10-CM

## 2018-10-07 MED ORDER — FLUTICASONE FUROATE-VILANTEROL 100-25 MCG/INH IN AEPB: 1.0000 | INHALATION_SPRAY | Freq: Every day | RESPIRATORY_TRACT | 12 refills | Status: DC

## 2018-10-14 ENCOUNTER — Telehealth: Payer: Self-pay | Admitting: Internal Medicine

## 2018-10-14 NOTE — Telephone Encounter (Signed)
Samples are limited for now  Sent the Rx bre[facility] 10/07/2018  Thanks TMS

## 2018-10-14 NOTE — Telephone Encounter (Signed)
Copied from CRM [id number]. Topic: General - Other >> Oct 14, 2018  9:38 AM Leafy Ro wrote: Reason for CRM: pt was given a sample of breo 100/25 and the med works good. Pt only has 2 day worth of med. Pt would like to come pick another sample  up or md can sent new rx to [facility] [place] on [address]

## 2018-10-19 ENCOUNTER — Other Ambulatory Visit: Payer: Self-pay

## 2018-10-19 ENCOUNTER — Ambulatory Visit: Payer: 59 | Admitting: Internal Medicine

## 2018-10-19 ENCOUNTER — Ambulatory Visit (INDEPENDENT_AMBULATORY_CARE_PROVIDER_SITE_OTHER): Payer: 59 | Admitting: Internal Medicine

## 2018-10-19 DIAGNOSIS — R002 Palpitations: Secondary | ICD-10-CM | POA: Diagnosis not present

## 2018-10-19 DIAGNOSIS — J4520 Mild intermittent asthma, uncomplicated: Secondary | ICD-10-CM | POA: Diagnosis not present

## 2018-10-19 NOTE — Progress Notes (Signed)
Virtual Visit via Video Note  I connected with Cheryl Coleman on 10/19/18 at  8:46-9:02 AM EDT by a video enabled telemedicine application and verified that I am speaking with the correct person using two identifiers.  Location patient: home Location provider:work  Persons participating in the virtual visit: patient, provider  I discussed the limitations of evaluation and management by telemedicine and the availability of in person appointments. The patient expressed understanding and agreed to proceed.   HPI: F/u  Asthma controlled breo 100/25 1 puff 1x per day helping more than Wixela. She is now able to run up the stairs and she is not out of breath where before walking up the steps made her feel sob.   Palpitations cardiologist was in [place] on Diltiazem she feels a times her heart races and does not feel anxious but notices with caffeine   ROS: See pertinent positives and negatives per HPI.  Past Medical History:  Diagnosis Date  . Arthritis   . Asthma   . Diverticulitis   . GERD (gastroesophageal reflux disease)   . Hiatal hernia   . History of blood transfusion   . History of chicken pox   . History of GI bleed    2013/14  . Hyperlipidemia   . Hypertension     Past Surgical History:  Procedure Laterality Date  . BREAST SURGERY     bx dense breast nl per pt   . CESAREAN SECTION     04/27/84    Family History  Problem Relation Age of Onset  . Hypertension Maternal Aunt   . Diabetes Maternal Aunt     SOCIAL HX: married with kids    Current Outpatient Medications:  .  albuterol (PROVENTIL HFA;VENTOLIN HFA) 108 (90 Base) MCG/ACT inhaler, Inhale 1-2 puffs into the lungs every 6 (six) hours as needed., Disp: 1 Inhaler, Rfl: 11 .  azithromycin (ZITHROMAX) 250 MG tablet, 2 pills day 1 and 1 pill day 2-5, Disp: 6 tablet, Rfl: 0 .  diltiazem (CARDIZEM CD) 120 MG 24 hr capsule, Take 1 capsule (120 mg total) by mouth daily., Disp: 90 capsule, Rfl: 3 .  fluticasone  furoate-vilanterol (BREO ELLIPTA) 100-25 MCG/INH AEPB, Inhale 1 puff into the lungs daily. Rinse mouth, Disp: 60 each, Rfl: 12 .  furosemide (LASIX) 20 MG tablet, Take 0.5 tablets (10 mg total) by mouth daily as needed., Disp: 30 tablet, Rfl: 1 .  montelukast (SINGULAIR) 10 MG tablet, Take 1 tablet (10 mg total) by mouth at bedtime., Disp: 90 tablet, Rfl: 3 .  nystatin (MYCOSTATIN) 100000 UNIT/ML suspension, Take 5 mLs (500,000 Units total) by mouth 4 (four) times daily. X 7-10 days, Disp: 120 mL, Rfl: 0 .  omeprazole (PRILOSEC) 40 MG capsule, Take 1 capsule (40 mg total) by mouth daily. 30 min before food, Disp: 90 capsule, Rfl: 3  EXAM:  VITALS per patient if applicable:  GENERAL: alert, oriented, appears well and in no acute distress  HEENT: atraumatic, conjunttiva clear, no obvious abnormalities on inspection of external nose and ears  NECK: normal movements of the head and neck  LUNGS: on inspection no signs of respiratory distress, breathing rate appears normal, no obvious gross SOB, gasping or wheezing  CV: no obvious cyanosis  MS: moves all visible extremities without noticeable abnormality  PSYCH/NEURO: pleasant and cooperative, no obvious depression or anxiety, speech and thought processing grossly intact  ASSESSMENT AND PLAN:  Discussed the following assessment and plan:  Mild intermittent asthma, unspecified whether complicated-cont Breo 100/25 1  puff 1x per day   Palpitations - Plan: Ambulatory referral to Cardiology, continue Diltiazem for now 120 24 hrs qd     I discussed the assessment and treatment plan with the patient. The patient was provided an opportunity to ask questions and all were answered. The patient agreed with the plan and demonstrated an understanding of the instructions.   The patient was advised to call back or seek an in-person evaluation if the symptoms worsen or if the condition fails to improve as anticipated.  I provided 15 minutes of  non-face-to-face time during this encounter.   [name] [name], MD

## 2018-10-26 ENCOUNTER — Telehealth: Payer: Self-pay | Admitting: Internal Medicine

## 2018-10-26 NOTE — Telephone Encounter (Signed)
sch f/u 10 or 05/2019  Thanks TMS 

## 2018-11-03 ENCOUNTER — Encounter: Payer: Self-pay | Admitting: Internal Medicine

## 2018-11-03 ENCOUNTER — Ambulatory Visit (INDEPENDENT_AMBULATORY_CARE_PROVIDER_SITE_OTHER): Payer: 59 | Admitting: Internal Medicine

## 2018-11-03 DIAGNOSIS — B351 Tinea unguium: Secondary | ICD-10-CM

## 2018-11-03 DIAGNOSIS — L03031 Cellulitis of right toe: Secondary | ICD-10-CM

## 2018-11-03 MED ORDER — CICLOPIROX 8 % EX SOLN: Freq: Every day | CUTANEOUS | 5 refills | Status: DC

## 2018-11-03 MED ORDER — MUPIROCIN 2 % EX OINT: 1.0000 "application " | TOPICAL_OINTMENT | Freq: Two times a day (BID) | CUTANEOUS | 0 refills | Status: DC

## 2018-11-03 NOTE — Progress Notes (Signed)
Telephone Note  I connected with Cheryl Coleman  on 11/03/18 at  8:13 AM EDT by telephone and verified that I am speaking with the correct person using two identifiers.  Location patient: home Location provider:work  Persons participating in the virtual visit: patient, provider  I discussed the limitations of evaluation and management by telemedicine and the availability of in person appointments. The patient expressed understanding and agreed to proceed.   HPI: C/o right great toe dark, fragile nail ws a black streak now more spread to the toenail and grey color she has had this since getting a pedicure a while ago tried otc antifungals w/o help surrounding skin itchy and toe was painful and sore to touch. She does not see an ingrown nail or drainage or redness or warmth    ROS: See pertinent positives and negatives per HPI.  Past Medical History:  Diagnosis Date  . Arthritis   . Asthma   . Diverticulitis   . GERD (gastroesophageal reflux disease)   . Hiatal hernia   . History of blood transfusion   . History of chicken pox   . History of GI bleed    2013/14  . Hyperlipidemia   . Hypertension     Past Surgical History:  Procedure Laterality Date  . BREAST SURGERY     bx dense breast nl per pt   . CESAREAN SECTION     04/27/84    Family History  Problem Relation Age of Onset  . Hypertension Maternal Aunt   . Diabetes Maternal Aunt     SOCIAL HX: married    Current Outpatient Medications:  .  albuterol (PROVENTIL HFA;VENTOLIN HFA) 108 (90 Base) MCG/ACT inhaler, Inhale 1-2 puffs into the lungs every 6 (six) hours as needed., Disp: 1 Inhaler, Rfl: 11 .  azithromycin (ZITHROMAX) 250 MG tablet, 2 pills day 1 and 1 pill day 2-5, Disp: 6 tablet, Rfl: 0 .  ciclopirox (PENLAC) 8 % solution, Apply topically at bedtime. Apply over nail and surrounding skin. Apply daily over previous coat. After seven (7) days, may remove with alcohol and continue cycle use up to 48 weeks, Disp:  6.6 mL, Rfl: 5 .  diltiazem (CARDIZEM CD) 120 MG 24 hr capsule, Take 1 capsule (120 mg total) by mouth daily., Disp: 90 capsule, Rfl: 3 .  fluticasone furoate-vilanterol (BREO ELLIPTA) 100-25 MCG/INH AEPB, Inhale 1 puff into the lungs daily. Rinse mouth, Disp: 60 each, Rfl: 12 .  furosemide (LASIX) 20 MG tablet, Take 0.5 tablets (10 mg total) by mouth daily as needed., Disp: 30 tablet, Rfl: 1 .  montelukast (SINGULAIR) 10 MG tablet, Take 1 tablet (10 mg total) by mouth at bedtime., Disp: 90 tablet, Rfl: 3 .  mupirocin ointment (BACTROBAN) 2 %, Place 1 application into the nose 2 (two) times daily. Skin around toe, Disp: 30 g, Rfl: 0 .  nystatin (MYCOSTATIN) 100000 UNIT/ML suspension, Take 5 mLs (500,000 Units total) by mouth 4 (four) times daily. X 7-10 days, Disp: 120 mL, Rfl: 0 .  omeprazole (PRILOSEC) 40 MG capsule, Take 1 capsule (40 mg total) by mouth daily. 30 min before food, Disp: 90 capsule, Rfl: 3  EXAM:  VITALS per patient if applicable:  GENERAL: alert, oriented, appears well and in no acute distress  PSYCH/NEURO: pleasant and cooperative, no obvious depression or anxiety, speech and thought processing grossly intact  SKIN: per pt right great toenail discolored   ASSESSMENT AND PLAN:  Discussed the following assessment and plan:  Paronychia of great  toe, right - Plan: mupirocin ointment (BACTROBAN) 2 %, ciclopirox (PENLAC) 8 % solution  Onychomycosis - Plan: mupirocin ointment (BACTROBAN) 2 %, ciclopirox (PENLAC) 8 % solution q7 days x 48 weeks  Reviewed oral lamisil but she declines  Call back in 1 month to see how doing  Warm soaks foot with antibacterial soap  My chart photo of toe since camera not working    I discussed the assessment and treatment plan with the patient. The patient was provided an opportunity to ask questions and all were answered. The patient agreed with the plan and demonstrated an understanding of the instructions.   The patient was advised to  call back or seek an in-person evaluation if the symptoms worsen or if the condition fails to improve as anticipated.  Time spent 12 minutes  Bevelyn Bucklesracy N [name], MD

## 2018-11-03 NOTE — Patient Instructions (Signed)
Fungal Nail Infection A fungal nail infection is a common infection of the toenails or fingernails. This condition affects toenails more often than fingernails. It often affects the great, or big, toes. More than one nail may be infected. The condition can be passed from person to person (is contagious). What are the causes? This condition is caused by a fungus. Several types of fungi can cause the infection. These fungi are common in moist and warm areas. If your hands or feet come into contact with the fungus, it may get into a crack in your fingernail or toenail and cause the infection. What increases the risk? The following factors may make you more likely to develop this condition:  Being female.  Being of older age.  Living with someone who has the fungus.  Walking barefoot in areas where the fungus thrives, such as showers or locker rooms.  Wearing shoes and socks that cause your feet to sweat.  Having a nail injury or a recent nail surgery.  Having certain medical conditions, such as: ? Athlete's foot. ? Diabetes. ? Psoriasis. ? Poor circulation. ? A weak body defense system (immune system). What are the signs or symptoms? Symptoms of this condition include:  A pale spot on the nail.  Thickening of the nail.  A nail that becomes yellow or brown.  A brittle or ragged nail edge.  A crumbling nail.  A nail that has lifted away from the nail bed. How is this diagnosed? This condition is diagnosed with a physical exam. Your health care provider may take a scraping or clipping from your nail to test for the fungus. How is this treated? Treatment is not needed for mild infections. If you have significant nail changes, treatment may include:  Antifungal medicines taken by mouth (orally). You may need to take the medicine for several weeks or several months, and you may not see the results for a long time. These medicines can cause side effects. Ask your health care provider  what problems to watch for.  Antifungal nail polish or nail cream. These may be used along with oral antifungal medicines.  Laser treatment of the nail.  Surgery to remove the nail. This may be needed for the most severe infections. It can take a long time, usually up to a year, for the infection to go away. The infection may also come back. Follow these instructions at home: Medicines  Take or apply over-the-counter and prescription medicines only as told by your health care provider.  Ask your health care provider about using over-the-counter mentholated ointment on your nails. Nail care  Trim your nails often.  Wash and dry your hands and feet every day.  Keep your feet dry: ? Wear absorbent socks, and change your socks frequently. ? Wear shoes that allow air to circulate, such as sandals or canvas tennis shoes. Throw out old shoes.  Do not use artificial nails.  If you go to a nail salon, make sure you choose one that uses clean instruments.  Use antifungal foot powder on your feet and in your shoes. General instructions  Do not share personal items, such as towels or nail clippers.  Do not walk barefoot in shower rooms or locker rooms.  Wear rubber gloves if you are working with your hands in wet areas.  Keep all follow-up visits as told by your health care provider. This is important. Contact a health care provider if: Your infection is not getting better or it is getting worse   after several months. Summary  A fungal nail infection is a common infection of the toenails or fingernails.  Treatment is not needed for mild infections. If you have significant nail changes, treatment may include taking medicine orally and applying medicine to your nails.  It can take a long time, usually up to a year, for the infection to go away. The infection may also come back.  Take or apply over-the-counter and prescription medicines only as told by your health care provider.   Follow instructions for taking care of your nails to help prevent infection from coming back or spreading. This information is not intended to replace advice given to you by your health care provider. Make sure you discuss any questions you have with your health care provider. Document Released: 07/03/2000 Document Revised: 12/10/2017 Document Reviewed: 12/10/2017 Elsevier Interactive Patient Education  2019 [employer]  Paronychia Paronychia is an infection of the skin that surrounds a nail. It usually affects the skin around a fingernail, but it may also occur near a toenail. It often causes pain and swelling around the nail. In some cases, a collection of pus (abscess) can form near or under the nail.  This condition may develop suddenly, or it may develop gradually over a longer period. In most cases, paronychia is not serious, and it will clear up with treatment. What are the causes? This condition may be caused by bacteria or a fungus. These germs can enter the body through an opening in the skin, such as a cut or a hangnail. What increases the risk? This condition is more likely to develop in people who:  Get their hands wet often, such as those who work as Fish farm manager, bartenders, or nurses.  Bite their fingernails or suck their thumbs.  Trim their nails very short.  Have hangnails or injured fingertips.  Get manicures.  Have diabetes. What are the signs or symptoms? Symptoms of this condition include:  Redness and swelling of the skin near the nail.  Tenderness around the nail when you touch the area.  Pus-filled bumps under the skin at the base and sides of the nail (cuticle).  Fluid or pus under the nail.  Throbbing pain in the area. How is this diagnosed? This condition is diagnosed with a physical exam. In some cases, a sample of pus may be tested to determine what type of bacteria or fungus is causing the condition. How is this treated? Treatment depends on the  cause and severity of your condition. If your condition is mild, it may clear up on its own in a few days or after soaking in warm water. If needed, treatment may include:  Antibiotic medicine, if your infection is caused by bacteria.  Antifungal medicine, if your infection is caused by a fungus.  A procedure to drain pus from an abscess.  Anti-inflammatory medicine (corticosteroids). Follow these instructions at home: Wound care  Keep the affected area clean.  Soak the affected area in warm water, if told to do so by your health care provider. You may be told to do this for 20 minutes, 2-3 times a day.  Keep the area dry when you are not soaking it.  Do not try to drain an abscess yourself.  Follow instructions from your health care provider about how to take care of the affected area. Make sure you: ? Wash your hands with soap and water before you change your bandage (dressing). If soap and water are not available, use hand sanitizer. ? Change  your dressing as told by your health care provider.  If you had an abscess drained, check the area every day for signs of infection. Check for: ? Redness, swelling, or pain. ? Fluid or blood. ? Warmth. ? Pus or a bad smell. Medicines   Take over-the-counter and prescription medicines only as told by your health care provider.  If you were prescribed an antibiotic medicine, take it as told by your health care provider. Do not stop taking the antibiotic even if you start to feel better. General instructions  Avoid contact with harsh chemicals.  Do not pick at the affected area. Prevention  To prevent this condition from happening again: ? Wear rubber gloves when washing dishes or doing other tasks that require your hands to get wet. ? Wear gloves if your hands might come in contact with cleaners or other chemicals. ? Avoid injuring your nails or fingertips. ? Do not bite your nails or tear hangnails. ? Do not cut your nails very  short. ? Do not cut your cuticles. ? Use clean nail clippers or scissors when trimming nails. Contact a health care provider if:  Your symptoms get worse or do not improve with treatment.  You have continued or increased fluid, blood, or pus coming from the affected area.  Your finger or knuckle becomes swollen or difficult to move. Get help right away if you have:  A fever or chills.  Redness spreading away from the affected area.  Joint or muscle pain. Summary  Paronychia is an infection of the skin that surrounds a nail. It often causes pain and swelling around the nail. In some cases, a collection of pus (abscess) can form near or under the nail.  This condition may be caused by bacteria or a fungus. These germs can enter the body through an opening in the skin, such as a cut or a hangnail.  If your condition is mild, it may clear up on its own in a few days. If needed, treatment may include medicine or a procedure to drain pus from an abscess.  To prevent this condition from happening again, wear gloves if doing tasks that require your hands to get wet or to come in contact with chemicals. Also avoid injuring your nails or fingertips. This information is not intended to replace advice given to you by your health care provider. Make sure you discuss any questions you have with your health care provider. Document Released: 12/30/2000 Document Revised: 07/19/2017 Document Reviewed: 07/19/2017 Elsevier Interactive Patient Education  2019 [employer]

## 2018-11-08 ENCOUNTER — Other Ambulatory Visit: Payer: Self-pay | Admitting: Internal Medicine

## 2018-11-08 DIAGNOSIS — I10 Essential (primary) hypertension: Secondary | ICD-10-CM

## 2018-11-08 DIAGNOSIS — R600 Localized edema: Secondary | ICD-10-CM

## 2018-11-08 MED ORDER — FUROSEMIDE 20 MG PO TABS: 10.0000 mg | ORAL_TABLET | Freq: Every day | ORAL | 5 refills | Status: DC | PRN

## 2018-11-25 ENCOUNTER — Encounter: Payer: Self-pay | Admitting: Internal Medicine

## 2018-11-25 ENCOUNTER — Ambulatory Visit (INDEPENDENT_AMBULATORY_CARE_PROVIDER_SITE_OTHER): Payer: 59 | Admitting: Internal Medicine

## 2018-11-25 ENCOUNTER — Other Ambulatory Visit: Payer: Self-pay

## 2018-11-25 DIAGNOSIS — F419 Anxiety disorder, unspecified: Secondary | ICD-10-CM | POA: Diagnosis not present

## 2018-11-25 DIAGNOSIS — F439 Reaction to severe stress, unspecified: Secondary | ICD-10-CM

## 2018-11-25 DIAGNOSIS — J4520 Mild intermittent asthma, uncomplicated: Secondary | ICD-10-CM | POA: Diagnosis not present

## 2018-11-25 DIAGNOSIS — G4700 Insomnia, unspecified: Secondary | ICD-10-CM | POA: Diagnosis not present

## 2018-11-25 DIAGNOSIS — J029 Acute pharyngitis, unspecified: Secondary | ICD-10-CM

## 2018-11-25 MED ORDER — ALPRAZOLAM 0.25 MG PO TABS: 0.1250 mg | ORAL_TABLET | Freq: Every evening | ORAL | 1 refills | Status: DC | PRN

## 2018-11-25 NOTE — Progress Notes (Signed)
Telephone Note  I connected with Cheryl Coleman  on 11/25/18 at  2:40 PM EDT by telephone and verified that I am speaking with the correct person using two identifiers.  Location patient: home Location provider:work  Persons participating in the virtual visit: patient, provider  I discussed the limitations of evaluation and management by telemedicine and the availability of in person appointments. The patient expressed understanding and agreed to proceed.   HPI: Anxiety/stress also due to work/insomnia - due to social issues lives near mother in law and husband and she have tension due to disagreements with her and mother in law   Sore throat see below after breo use so stopped nothing tried   Mild intermittent asthma,controlled triggers cold, smoke, chimney, exercise  -stopped breo due to h/a, dizziness, palpitations on 11/22/2018 only having to use albuterol 1x for now if asthma not controlled in future consider incruse/spiriva   Toenail doing well with penlac     ROS: See pertinent positives and negatives per HPI.  Past Medical History:  Diagnosis Date  . Arthritis   . Asthma   . Diverticulitis   . GERD (gastroesophageal reflux disease)   . Hiatal hernia   . History of blood transfusion   . History of chicken pox   . History of GI bleed    2013/14  . Hyperlipidemia   . Hypertension     Past Surgical History:  Procedure Laterality Date  . BREAST SURGERY     bx dense breast nl per pt   . CESAREAN SECTION     04/27/84    Family History  Problem Relation Age of Onset  . Hypertension Maternal Aunt   . Diabetes Maternal Aunt     SOCIAL HX: married lives husband    Current Outpatient Medications:  .  albuterol (PROVENTIL HFA;VENTOLIN HFA) 108 (90 Base) MCG/ACT inhaler, Inhale 1-2 puffs into the lungs every 6 (six) hours as needed., Disp: 1 Inhaler, Rfl: 11 .  ALPRAZolam (XANAX) 0.25 MG tablet, Take 0.5-1 tablets (0.125-0.25 mg total) by mouth at bedtime as needed for  anxiety., Disp: 30 tablet, Rfl: 1 .  ciclopirox (PENLAC) 8 % solution, Apply topically at bedtime. Apply over nail and surrounding skin. Apply daily over previous coat. After seven (7) days, may remove with alcohol and continue cycle use up to 48 weeks, Disp: 6.6 mL, Rfl: 5 .  diltiazem (CARDIZEM CD) 120 MG 24 hr capsule, Take 1 capsule (120 mg total) by mouth daily., Disp: 90 capsule, Rfl: 3 .  furosemide (LASIX) 20 MG tablet, Take 0.5 tablets (10 mg total) by mouth daily as needed., Disp: 30 tablet, Rfl: 5 .  montelukast (SINGULAIR) 10 MG tablet, Take 1 tablet (10 mg total) by mouth at bedtime., Disp: 90 tablet, Rfl: 3 .  mupirocin ointment (BACTROBAN) 2 %, Place 1 application into the nose 2 (two) times daily. Skin around toe, Disp: 30 g, Rfl: 0 .  nystatin (MYCOSTATIN) 100000 UNIT/ML suspension, Take 5 mLs (500,000 Units total) by mouth 4 (four) times daily. X 7-10 days, Disp: 120 mL, Rfl: 0 .  omeprazole (PRILOSEC) 40 MG capsule, Take 1 capsule (40 mg total) by mouth daily. 30 min before food, Disp: 90 capsule, Rfl: 3  EXAM:  VITALS per patient if applicable:  GENERAL: alert, oriented, appears well and in no acute distress  PSYCH/NEURO: pleasant and cooperative, no obvious depression or anxiety, speech and thought processing grossly intact  ASSESSMENT AND PLAN:  Discussed the following assessment and plan:  Anxiety/stress/insomnia -  Plan: ALPRAZolam (XANAX) 0.25 MG tablet 1/2-1 pill prn qhs   Sore throat c/w thrust rec use nystatin and swallow mouthwash  Mild intermittent asthma, unspecified whether complicated trigger cold, smoke, chimney, exercise  -stopped breo due to h/a, dizziness, palpitations on 11/22/2018 only having to use albuterol 1x for now if asthma not controlled in future consider incruse/spiriva   Toenail fungus continue penlac  HM Flu shot [place] Ned[place]  [place]  [place]  Consider shingrix in future   [facility] [place]  [place]  [place]  colonoscopy 10/15/06 mod to severe diverticulosis IH f/u in 5 years  EGD 11/29/07 candida, mild gastritis  EGD 01/03/13/colonoscopy diverticulosis ext and int hemorrhoids TI with ulceration neg H pylori UGD 02/01/18 gastritis  [place]ap [place] DEXA consider age 63     I discussed the assessment and treatment plan with the patient. The patient was provided an opportunity to ask questions and all were answered. The patient agreed with the plan and demonstrated an understanding of the instructions.   The patient was advised to call back or seek an in-person evaluation if the symptoms worsen or if the condition fails to improve as anticipated.  Time spent 15 minutes  Bevelyn Bucklesracy N [name], MD

## 2018-12-08 ENCOUNTER — Telehealth: Payer: Self-pay

## 2018-12-08 ENCOUNTER — Other Ambulatory Visit: Payer: Self-pay | Admitting: Internal Medicine

## 2018-12-08 DIAGNOSIS — J4520 Mild intermittent asthma, uncomplicated: Secondary | ICD-10-CM

## 2018-12-08 MED ORDER — FLUTICASONE-SALMETEROL 100-50 MCG/DOSE IN AEPB: 1.0000 | INHALATION_SPRAY | Freq: Two times a day (BID) | RESPIRATORY_TRACT | 12 refills | Status: DC

## 2018-12-08 NOTE — Telephone Encounter (Signed)
Copied from CRM [id number]. Topic: General - Other >> Dec 08, 2018  1:33 PM Jaquita Rector A wrote: Reason for CRM: Patient called to ask if Dr French Ana nurse can give her a call she would like to discuss a possible change of medication from [facility] to something else. Please call patient at Ph# [phone]

## 2018-12-23 ENCOUNTER — Ambulatory Visit: Payer: 59 | Admitting: Internal Medicine

## 2019-01-10 ENCOUNTER — Telehealth: Payer: Self-pay

## 2019-01-10 ENCOUNTER — Ambulatory Visit: Payer: 59

## 2019-01-10 ENCOUNTER — Other Ambulatory Visit: Payer: 59

## 2019-01-10 ENCOUNTER — Telehealth: Payer: Self-pay | Admitting: *Deleted

## 2019-01-10 DIAGNOSIS — Z20822 Contact with and (suspected) exposure to covid-19: Secondary | ICD-10-CM

## 2019-01-10 NOTE — Telephone Encounter (Signed)
-----   Message from Delorise Jackson, MD sent at 01/10/2019 12:40 PM EDT ----- Primary Coverage  Payer Plan Sponsor Code Group Number Group Name Albina Billet [facility]  [id number] [address] Primary Subscriber  ID Name [facility] Address [id number]    [employer], [place] [address]  08/19/1955   COVID testing

## 2019-01-10 NOTE — Telephone Encounter (Signed)
Copied from [place] [id number]. Topic: General - Other >> Jan 10, 2019  7:52 AM Leward Quan A wrote: Reason for CRM: Patient called to say  that she was having some abdominal discomfort, scratchy throat and sore throat, cough and would like to be tested for the coronavirus symptoms started on 01/09/2019. Ph# [phone]

## 2019-01-10 NOTE — Telephone Encounter (Signed)
Called and spoke to patient.  Patient said that on yesterday she started getting chills, sore and scratchy throat, cough, congestion, a little shortness of breath, some muscle aches.  No fever, no vomiting, no diarrhea but had several bowel movements (soft stools).  Attributed to possibly something she ate on Sunday.  She said that she went to a funeral on Sunday but maintained social distance and said attendees had masks on.    No exposure to anyone that has Corona Virus that she knows. Patient said she traveled to [place],  [place] last Thursday and stayed for 2 days. 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  Has been using Mucinex for congestion last night.  Patient said she is concerned because she is asthmatic.  Patient wants to be tested for COVID.  Patient will wait to be advised by PCP.    Recommended that patient and household member self-quarantine given symptoms.  Advised patient to go to ED if symptoms worsen especially shortness of breath and high fever.

## 2019-01-10 NOTE — Telephone Encounter (Signed)
Pt called and scheduled for COVID-19 testing at [facility] location on 01/10/19 at 3:45 pm. Pt advised to wear a mask and remain in car at the time of appt. Pt verbalized understanding.

## 2019-01-13 LAB — NOVEL CORONAVIRUS, NAA: SARS-CoV-2, NAA: NOT DETECTED

## 2019-01-27 ENCOUNTER — Other Ambulatory Visit: Payer: Self-pay | Admitting: Internal Medicine

## 2019-01-27 DIAGNOSIS — B370 Candidal stomatitis: Secondary | ICD-10-CM

## 2019-01-27 MED ORDER — NYSTATIN 100000 UNIT/ML MT SUSP: 5.0000 mL | Freq: Four times a day (QID) | OROMUCOSAL | 0 refills | Status: DC

## 2019-03-09 ENCOUNTER — Telehealth: Payer: Self-pay | Admitting: Internal Medicine

## 2019-03-09 DIAGNOSIS — J4520 Mild intermittent asthma, uncomplicated: Secondary | ICD-10-CM

## 2019-03-09 MED ORDER — MONTELUKAST SODIUM 10 MG PO TABS: 10.0000 mg | ORAL_TABLET | Freq: Every day | ORAL | 3 refills | Status: DC

## 2019-03-09 NOTE — Telephone Encounter (Signed)
Refill sent.

## 2019-03-09 NOTE — Telephone Encounter (Signed)
montelukast (SINGULAIR) 10 MG tablet [[id number]]     Called pharmacy and pharmacy states that they never received RX in January. Can we please resend.     [id number] [address], [place] [phone] (Phone) [phone] (Fax)

## 2019-03-22 ENCOUNTER — Ambulatory Visit
Admission: RE | Admit: 2019-03-22 | Discharge: 2019-03-22 | Disposition: A | Discharge status: Home / Self Care | Payer: 59 | Source: Ambulatory Visit | Attending: Internal Medicine | Admitting: Internal Medicine

## 2019-03-22 ENCOUNTER — Encounter: Payer: Self-pay | Admitting: Radiology

## 2019-03-22 DIAGNOSIS — Z1231 Encounter for screening mammogram for malignant neoplasm of breast: Secondary | ICD-10-CM

## 2019-05-25 ENCOUNTER — Ambulatory Visit: Payer: 59 | Admitting: Internal Medicine

## 2019-05-31 ENCOUNTER — Other Ambulatory Visit: Payer: Self-pay

## 2019-06-01 ENCOUNTER — Ambulatory Visit: Payer: 59 | Admitting: Internal Medicine

## 2019-06-01 ENCOUNTER — Telehealth: Payer: Self-pay | Admitting: Internal Medicine

## 2019-06-01 NOTE — Telephone Encounter (Signed)
PT was late for her 11/12, 8am appt. She would like to reschedule, Dr. Aundra Dubin only has sameday appts. Where should we schedule her?

## 2019-06-01 NOTE — Telephone Encounter (Signed)
Pt has been rescheduled to 06/06/2019 @ 1pm Doxy.

## 2019-06-01 NOTE — Telephone Encounter (Signed)
Soonest available

## 2019-06-06 ENCOUNTER — Other Ambulatory Visit: Payer: Self-pay

## 2019-06-06 ENCOUNTER — Ambulatory Visit (INDEPENDENT_AMBULATORY_CARE_PROVIDER_SITE_OTHER): Payer: 59 | Admitting: Internal Medicine

## 2019-06-06 VITALS — Ht 64.0 in | Wt 210.0 lb

## 2019-06-06 DIAGNOSIS — E559 Vitamin D deficiency, unspecified: Secondary | ICD-10-CM | POA: Diagnosis not present

## 2019-06-06 DIAGNOSIS — E785 Hyperlipidemia, unspecified: Secondary | ICD-10-CM | POA: Diagnosis not present

## 2019-06-06 DIAGNOSIS — K2970 Gastritis, unspecified, without bleeding: Secondary | ICD-10-CM | POA: Diagnosis not present

## 2019-06-06 DIAGNOSIS — K219 Gastro-esophageal reflux disease without esophagitis: Secondary | ICD-10-CM

## 2019-06-06 DIAGNOSIS — J029 Acute pharyngitis, unspecified: Secondary | ICD-10-CM

## 2019-06-06 DIAGNOSIS — R5383 Other fatigue: Secondary | ICD-10-CM

## 2019-06-06 DIAGNOSIS — R7303 Prediabetes: Secondary | ICD-10-CM | POA: Diagnosis not present

## 2019-06-06 DIAGNOSIS — I10 Essential (primary) hypertension: Secondary | ICD-10-CM

## 2019-06-06 DIAGNOSIS — K449 Diaphragmatic hernia without obstruction or gangrene: Secondary | ICD-10-CM | POA: Diagnosis not present

## 2019-06-06 DIAGNOSIS — J4520 Mild intermittent asthma, uncomplicated: Secondary | ICD-10-CM

## 2019-06-06 DIAGNOSIS — R519 Headache, unspecified: Secondary | ICD-10-CM | POA: Diagnosis not present

## 2019-06-06 NOTE — Progress Notes (Signed)
telephone Note  I connected with Cheryl Coleman   on 06/06/19 at  1:10 PM EST by a telephone no video only sound and verified that I am speaking with the correct person using two identifiers.  Location patient: home Location provider:work or home office Persons participating in the virtual visit: patient, provider  I discussed the limitations of evaluation and management by telemedicine and the availability of in person appointments. The patient expressed understanding and agreed to proceed.   HPI: 1. C/o fatigue 1+ week ago and immune system low  2. HTN on dilt 120 mg qd  3/ c/o sore throat esp with wearing a mask after wearing mask x 2 days afterward with h/o gastritis, candidate esophagus sore throat goes down to upper chest with h/o hiatal hernia wants tos see GI and ENT. She tried to gargle with nystatin and salt water which helped. She only notices this with wearing surgical mask and has h/o asthma   4. H/o last week not feeling well with fatigue. Sleeping 5-6 hrs 1 cup of decaff coffee and at times reg coffee [employer] and ocassional green tea appt w/ eye md 06/10/19 wears distance glasses and readers   ROS: See pertinent positives and negatives per HPI.  Past Medical History:  Diagnosis Date  . Arthritis   . Asthma   . Diverticulitis   . GERD (gastroesophageal reflux disease)   . Hiatal hernia   . History of blood transfusion   . History of chicken pox   . History of GI bleed    2013/14  . Hyperlipidemia   . Hypertension     Past Surgical History:  Procedure Laterality Date  . BREAST SURGERY     bx dense breast nl per pt   . CESAREAN SECTION     04/27/84    Family History  Problem Relation Age of Onset  . Hypertension Maternal Aunt   . Diabetes Maternal Aunt   . Breast cancer Neg Hx     SOCIAL HX: married    Current Outpatient Medications:  .  albuterol (PROVENTIL HFA;VENTOLIN HFA) 108 (90 Base) MCG/ACT inhaler, Inhale 1-2 puffs into the lungs every 6 (six)  hours as needed., Disp: 1 Inhaler, Rfl: 11 .  ALPRAZolam (XANAX) 0.25 MG tablet, Take 0.5-1 tablets (0.125-0.25 mg total) by mouth at bedtime as needed for anxiety., Disp: 30 tablet, Rfl: 1 .  ciclopirox (PENLAC) 8 % solution, Apply topically at bedtime. Apply over nail and surrounding skin. Apply daily over previous coat. After seven (7) days, may remove with alcohol and continue cycle use up to 48 weeks, Disp: 6.6 mL, Rfl: 5 .  diltiazem (CARDIZEM CD) 120 MG 24 hr capsule, Take 1 capsule (120 mg total) by mouth daily., Disp: 90 capsule, Rfl: 3 .  Fluticasone-Salmeterol (ADVAIR DISKUS) 100-50 MCG/DOSE AEPB, Inhale 1 puff into the lungs 2 (two) times a day. RINSE MOUTH.NOT GENERIC, Disp: 60 each, Rfl: 12 .  furosemide (LASIX) 20 MG tablet, Take 0.5 tablets (10 mg total) by mouth daily as needed., Disp: 30 tablet, Rfl: 5 .  montelukast (SINGULAIR) 10 MG tablet, Take 1 tablet (10 mg total) by mouth at bedtime., Disp: 90 tablet, Rfl: 3 .  mupirocin ointment (BACTROBAN) 2 %, Place 1 application into the nose 2 (two) times daily. Skin around toe, Disp: 30 g, Rfl: 0 .  nystatin (MYCOSTATIN) 100000 UNIT/ML suspension, Take 5 mLs (500,000 Units total) by mouth 4 (four) times daily. X 7-10 days, Disp: 473 mL, Rfl: 0 .  omeprazole (PRILOSEC) 40 MG capsule, Take 1 capsule (40 mg total) by mouth daily. 30 min before food, Disp: 90 capsule, Rfl: 3  EXAM:  VITALS per patient if applicable:  GENERAL: alert, oriented, appears well and in no acute distress  PSYCH/NEURO: pleasant and cooperative, no obvious depression or anxiety, speech and thought processing grossly intact  ASSESSMENT AND PLAN:  Discussed the following assessment and plan:  Fatigue, unspecified type - Plan: Comprehensive metabolic panel, CBC with Differential/Platelet, TSH  Hyperlipidemia, unspecified hyperlipidemia type - Plan: Lipid panel  Prediabetes - Plan: HgB A1c  Vitamin D deficiency - Plan: Vitamin D (25 hydroxy)  Sore throat -  Plan: Ambulatory referral to Gastroenterology, Ambulatory referral to ENT further w/u if negative consider allergy with h/o asthma  Hiatal hernia - Plan: Ambulatory referral to Gastroenterology  Gastritis without bleeding, unspecified chronicity, unspecified gastritis type - Plan: Ambulatory referral to Gastroenterology  Essential hypertension -cont meds rech buy BP cuff   Mild intermittent asthma, unspecified whether complicated -cont inhalers for now and consider allergy vs pulm in future   H/a -rec prn Tylenol increase rest  Reduce caffeine  Eye exam 06/10/19   HM Flu shot utd  [place]  [place]  Consider shingrix in future   03/22/19 mammo normal  [place]  colonoscopy 10/15/06 mod to severe diverticulosis IH f/u in 5 years  EGD 11/29/07 candida, mild gastritis  EGD 01/03/13/colonoscopy diverticulosis ext and int hemorrhoids TI with ulceration neg H pylori UGD 02/01/18 gastritis? If had colonoscopy   [place]ap get [place]-will do at f/u  DEXA consider age 21  -we discussed possible serious and likely etiologies, options for evaluation and workup, limitations of telemedicine visit vs in person visit, treatment, treatment risks and precautions. Pt prefers to treat via telemedicine empirically rather then risking or undertaking an in person visit at this moment. Patient agrees to seek prompt in person care if worsening, new symptoms arise, or if is not improving with treatment.   I discussed the assessment and treatment plan with the patient. The patient was provided an opportunity to ask questions and all were answered. The patient agreed with the plan and demonstrated an understanding of the instructions.   The patient was advised to call back or seek an in-person evaluation if the symptoms worsen or if the condition fails to improve as anticipated.  Time spent 20 minutes  Bevelyn Buckles, MD

## 2019-06-13 ENCOUNTER — Other Ambulatory Visit: Payer: Self-pay

## 2019-06-13 ENCOUNTER — Other Ambulatory Visit (INDEPENDENT_AMBULATORY_CARE_PROVIDER_SITE_OTHER): Payer: 59

## 2019-06-13 ENCOUNTER — Other Ambulatory Visit: Payer: Self-pay | Admitting: Internal Medicine

## 2019-06-13 DIAGNOSIS — R5383 Other fatigue: Secondary | ICD-10-CM

## 2019-06-13 DIAGNOSIS — E559 Vitamin D deficiency, unspecified: Secondary | ICD-10-CM | POA: Diagnosis not present

## 2019-06-13 DIAGNOSIS — E785 Hyperlipidemia, unspecified: Secondary | ICD-10-CM

## 2019-06-13 DIAGNOSIS — B370 Candidal stomatitis: Secondary | ICD-10-CM

## 2019-06-13 DIAGNOSIS — R7303 Prediabetes: Secondary | ICD-10-CM | POA: Diagnosis not present

## 2019-06-13 LAB — COMPREHENSIVE METABOLIC PANEL WITH GFR
ALT: 12 U/L (ref 0–35)
AST: 14 U/L (ref 0–37)
Albumin: 4.1 g/dL (ref 3.5–5.2)
Alkaline Phosphatase: 65 U/L (ref 39–117)
BUN: 10 mg/dL (ref 6–23)
CO2: 30 meq/L (ref 19–32)
Calcium: 9.5 mg/dL (ref 8.4–10.5)
Chloride: 103 meq/L (ref 96–112)
Creatinine, Ser: 0.89 mg/dL (ref 0.40–1.20)
GFR: 63.68 mL/min
Glucose, Bld: 97 mg/dL (ref 70–99)
Potassium: 3.9 meq/L (ref 3.5–5.1)
Sodium: 139 meq/L (ref 135–145)
Total Bilirubin: 0.5 mg/dL (ref 0.2–1.2)
Total Protein: 8.3 g/dL (ref 6.0–8.3)

## 2019-06-13 LAB — LIPID PANEL
Cholesterol: 224 mg/dL — ABNORMAL HIGH (ref 0–200)
HDL: 57.2 mg/dL
LDL Cholesterol: 149 mg/dL — ABNORMAL HIGH (ref 0–99)
NonHDL: 166.88
Total CHOL/HDL Ratio: 4
Triglycerides: 90 mg/dL (ref 0.0–149.0)
VLDL: 18 mg/dL (ref 0.0–40.0)

## 2019-06-13 LAB — CBC WITH DIFFERENTIAL/PLATELET
Basophils Absolute: 0.1 K/uL (ref 0.0–0.1)
Basophils Relative: 0.8 % (ref 0.0–3.0)
Eosinophils Absolute: 0.2 K/uL (ref 0.0–0.7)
Eosinophils Relative: 2.3 % (ref 0.0–5.0)
HCT: 39.8 % (ref 36.0–46.0)
Hemoglobin: 13.6 g/dL (ref 12.0–15.0)
Lymphocytes Relative: 39.1 % (ref 12.0–46.0)
Lymphs Abs: 4.1 K/uL — ABNORMAL HIGH (ref 0.7–4.0)
MCHC: 34.2 g/dL (ref 30.0–36.0)
MCV: 78.3 fl (ref 78.0–100.0)
Monocytes Absolute: 0.8 K/uL (ref 0.1–1.0)
Monocytes Relative: 7.5 % (ref 3.0–12.0)
Neutro Abs: 5.2 K/uL (ref 1.4–7.7)
Neutrophils Relative %: 50.3 % (ref 43.0–77.0)
Platelets: 261 K/uL (ref 150.0–400.0)
RBC: 5.09 Mil/uL (ref 3.87–5.11)
RDW: 16.6 % — ABNORMAL HIGH (ref 11.5–15.5)
WBC: 10.4 K/uL (ref 4.0–10.5)

## 2019-06-13 LAB — TSH: TSH: 2.08 u[IU]/mL (ref 0.35–4.50)

## 2019-06-13 LAB — VITAMIN D 25 HYDROXY (VIT D DEFICIENCY, FRACTURES): VITD: 30.61 ng/mL (ref 30.00–100.00)

## 2019-06-13 LAB — HEMOGLOBIN A1C: Hgb A1c MFr Bld: 5.6 % (ref 4.6–6.5)

## 2019-06-13 MED ORDER — NYSTATIN 100000 UNIT/ML MT SUSP: 5.0000 mL | Freq: Four times a day (QID) | OROMUCOSAL | 0 refills | Status: DC

## 2019-06-27 ENCOUNTER — Telehealth: Payer: Self-pay | Admitting: *Deleted

## 2019-06-27 ENCOUNTER — Other Ambulatory Visit: Payer: Self-pay | Admitting: Internal Medicine

## 2019-06-27 DIAGNOSIS — E785 Hyperlipidemia, unspecified: Secondary | ICD-10-CM

## 2019-06-27 MED ORDER — ATORVASTATIN CALCIUM 10 MG PO TABS: 10.0000 mg | ORAL_TABLET | Freq: Every day | ORAL | 3 refills | Status: DC

## 2019-06-27 NOTE — Telephone Encounter (Signed)
Sent lipitor 10 mg daily at night

## 2019-06-27 NOTE — Telephone Encounter (Signed)
Sent lipitor 10 mg daily at night 

## 2019-06-27 NOTE — Telephone Encounter (Signed)
Copied from [place] [id number]. Topic: General - Inquiry >> Jun 27, 2019  8:45 AM Richardo Priest, NT wrote: Reason for CRM: Pt called in in regards to lab results. Pt read on letter that they recommend medication for her labs and pt is agreeable to start the medication. Please advise and call back.

## 2019-06-27 NOTE — Telephone Encounter (Signed)
Patient has been informed.

## 2019-06-30 ENCOUNTER — Telehealth: Payer: Self-pay | Admitting: Internal Medicine

## 2019-06-30 NOTE — Telephone Encounter (Signed)
Pt called in stated she is congested, itchy throat, and overall just don't feel good. She said she took Tylenol so she thinks this is why she don't have a fever. She wanted a virtual appointment today but there are no openings.

## 2019-06-30 NOTE — Telephone Encounter (Signed)
I called and poke with patient & advised we had no appointments today in our office. She stated that yesterday she was out & was not dressed warmly enough for the weather & now she has a scratchy throat. She said she had hx of bronchitis & wanted to possibly be COVID tested just to make sure. I advised that COVID testing was now done by appointment only & she stated that she may go to [facility] to be tested. I gave her locations of UC & she said that she may got to [facility] walk-in because she was more familiar with that clinic.

## 2019-07-11 ENCOUNTER — Other Ambulatory Visit: Payer: Self-pay | Admitting: Internal Medicine

## 2019-07-11 DIAGNOSIS — R600 Localized edema: Secondary | ICD-10-CM

## 2019-07-11 DIAGNOSIS — I10 Essential (primary) hypertension: Secondary | ICD-10-CM

## 2019-07-11 MED ORDER — FUROSEMIDE 20 MG PO TABS: 10.0000 mg | ORAL_TABLET | Freq: Every day | ORAL | 5 refills | Status: DC | PRN

## 2019-08-17 ENCOUNTER — Other Ambulatory Visit: Payer: Self-pay

## 2019-08-24 ENCOUNTER — Encounter: Payer: 59 | Admitting: Internal Medicine

## 2019-08-28 ENCOUNTER — Ambulatory Visit: Payer: 59 | Admitting: Gastroenterology

## 2019-08-29 ENCOUNTER — Other Ambulatory Visit: Payer: Self-pay

## 2019-08-29 ENCOUNTER — Ambulatory Visit (INDEPENDENT_AMBULATORY_CARE_PROVIDER_SITE_OTHER): Payer: 59 | Admitting: Gastroenterology

## 2019-08-29 ENCOUNTER — Encounter: Payer: Self-pay | Admitting: Gastroenterology

## 2019-08-29 VITALS — BP 128/76 | HR 93 | Temp 97.8°F | Wt 236.0 lb

## 2019-08-29 DIAGNOSIS — J45909 Unspecified asthma, uncomplicated: Secondary | ICD-10-CM | POA: Insufficient documentation

## 2019-08-29 DIAGNOSIS — R1013 Epigastric pain: Secondary | ICD-10-CM

## 2019-08-29 DIAGNOSIS — K2100 Gastro-esophageal reflux disease with esophagitis, without bleeding: Secondary | ICD-10-CM | POA: Diagnosis not present

## 2019-08-29 DIAGNOSIS — R000 Tachycardia, unspecified: Secondary | ICD-10-CM | POA: Insufficient documentation

## 2019-08-29 DIAGNOSIS — K5900 Constipation, unspecified: Secondary | ICD-10-CM

## 2019-08-29 MED ORDER — OMEPRAZOLE 40 MG PO CPDR: 40.0000 mg | DELAYED_RELEASE_CAPSULE | Freq: Two times a day (BID) | ORAL | 3 refills | Status: DC

## 2019-08-29 NOTE — Progress Notes (Signed)
Cheryl Repress, MD [address]  [address]  [place], [place] [address]  Main: [phone]  Fax: [phone]    Gastroenterology Consultation  Referring Provider:     [name], French Ana * Primary Care Physician:  [name], Cheryl Spillers, MD Primary Gastroenterologist:  Dr. Arlyss Coleman Reason for Consultation: GERD, chronic constipation        HPI:   Cheryl Coleman is a 64 y.o. female referred by Dr. Judie Coleman, Cheryl Spillers, MD  for consultation & management of GERD, chronic constipation  GERD: Patient has history of chronic GERD, underwent several EGDs in the past which did not reveal erosive esophagitis, showed intraepithelial lymphocytes on biopsies. Patient reports having heartburn associated with regurgitation for several months, including nocturnal symptoms.  Currently taking omeprazole 40 mg once a day which provides partial relief only.  She also reports localized pain in her epigastric area.  Patient has gained about 25 pounds within last 1 year and patient does not feel her normal self because she was not this heavy before.  She is planning to go on a [employer] diet. She consumes rice and steak regularly She moved in later part of 2019 from [place] to [place].  Since then with the onset of pandemic, she has not been able to go out nor visit her children. Has been working from home.  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  She has been going back and forth between [place] and [place] due to some personal issues.  She found her life to be very stressful staying home, and discontinue place within last 1 year.  Patient is originally from [place]  Chronic constipation: This has also started within last 1 year, has gotten worse due to decreased physical activity and lack of fiber in her diet.  She takes MiraLAX as needed only. She underwent colonoscopy in 2014 which revealed superficial  ulceration in the terminal ileum, biopsies revealed chronic inflammation.  Patient reported that she had a repeat colonoscopy in 2019, report is not available with me today.  Patient denies abdominal pain, rectal bleeding, nausea or vomiting  She does not smoke or drink alcohol She denies family history of GI malignancy  NSAIDs: None  Antiplts/Anticoagulants/Anti thrombotics: None  GI Procedures:   EGD 02/01/2018 at [facility]    EGD and colonoscopy 01/03/2013 at [facility]      EGD 11/29/2007 at [facility]  No evidence of H. Pylori  Past Medical History:  Diagnosis Date  . Arthritis   . Asthma   . Diverticulitis   . GERD (gastroesophageal reflux disease)   . Hiatal hernia   . History of blood transfusion   . History of chicken pox   . History of GI bleed    2013/14  . Hyperlipidemia   . Hypertension     Past Surgical History:  Procedure Laterality Date  . BREAST SURGERY     bx dense breast nl per pt   . CESAREAN SECTION     04/27/84    Current Outpatient Medications:  .  albuterol (PROVENTIL HFA;VENTOLIN HFA) 108 (90 Base) MCG/ACT inhaler, Inhale 1-2 puffs into the lungs every 6 (six) hours as needed., Disp: 1 Inhaler, Rfl: 11 .  ALPRAZolam (XANAX) 0.25 MG tablet, Take 0.5-1 tablets (0.125-0.25 mg total) by mouth at bedtime as needed for anxiety., Disp: 30 tablet, Rfl: 1 .  atorvastatin (LIPITOR) 10 MG tablet,  Take 1 tablet (10 mg total) by mouth daily at 6 PM., Disp: 90 tablet, Rfl: 3 .  ciclopirox (PENLAC) 8 % solution, Apply topically at bedtime. Apply over nail and surrounding skin. Apply daily over previous coat. After seven (7) days, may remove with alcohol and continue cycle use up to 48 weeks, Disp: 6.6 mL, Rfl: 5 .  diltiazem (CARDIZEM CD) 120 MG 24 hr capsule, Take 1 capsule (120 mg total) by mouth daily., Disp: 90 capsule, Rfl: 3 .  diltiazem (TIAZAC) 120 MG 24 hr capsule, Take by mouth., Disp: , Rfl:  .   Fluticasone-Salmeterol (ADVAIR DISKUS) 100-50 MCG/DOSE AEPB, Inhale 1 puff into the lungs 2 (two) times a day. RINSE MOUTH.NOT GENERIC, Disp: 60 each, Rfl: 12 .  furosemide (LASIX) 20 MG tablet, Take 0.5 tablets (10 mg total) by mouth daily as needed., Disp: 30 tablet, Rfl: 5 .  montelukast (SINGULAIR) 10 MG tablet, Take 1 tablet (10 mg total) by mouth at bedtime., Disp: 90 tablet, Rfl: 3 .  mupirocin ointment (BACTROBAN) 2 %, Place 1 application into the nose 2 (two) times daily. Skin around toe, Disp: 30 g, Rfl: 0 .  nystatin (MYCOSTATIN) 100000 UNIT/ML suspension, Take 5 mLs (500,000 Units total) by mouth 4 (four) times daily. X 7-10 days, Disp: 473 mL, Rfl: 0 .  omeprazole (PRILOSEC) 40 MG capsule, Take 1 capsule (40 mg total) by mouth 2 (two) times daily., Disp: 60 capsule, Rfl: 3   Family History  Problem Relation Age of Onset  . Hypertension Maternal Aunt   . Diabetes Maternal Aunt   . Breast cancer Neg Hx      Social History   Tobacco Use  . Smoking status: Never Smoker  . Smokeless tobacco: Never Used  Substance Use Topics  . Alcohol use: Not Currently  . Drug use: Not Currently    Allergies as of 08/29/2019 - Review Complete 08/29/2019  Allergen Reaction Noted  . Oxycodone Other (See Comments) 08/18/2018    Review of Systems:    All systems reviewed and negative except where noted in HPI.   Physical Exam:  BP 128/76 (BP Location: Left Arm, Patient Position: Sitting, Cuff Size: Normal)   Pulse 93   Temp 97.8 F (36.6 C) (Oral)   Wt 236 lb (107 kg)   BMI 40.51 kg/m  No LMP recorded. Patient is postmenopausal.  General:   Alert,  Well-developed, well-nourished, pleasant and cooperative in NAD Head:  Normocephalic and atraumatic. Eyes:  Sclera clear, no icterus.   Conjunctiva pink. Ears:  Normal auditory acuity. Nose:  No deformity, discharge, or lesions. Mouth:  No deformity or lesions,oropharynx pink & moist. Neck:  Supple; no masses or thyromegaly. Lungs:   Respirations even and unlabored.  Clear throughout to auscultation.   No wheezes, crackles, or rhonchi. No acute distress. Heart:  Regular rate and rhythm; no murmurs, clicks, rubs, or gallops. Abdomen:  Normal bowel sounds. Soft, non-tender and non-distended without masses, hepatosplenomegaly or hernias noted.  No guarding or rebound tenderness.   Rectal: Not performed Msk:  Symmetrical without gross deformities. Good, equal movement & strength bilaterally. Pulses:  Normal pulses noted. Extremities:  No clubbing or edema.  No cyanosis. Neurologic:  Alert and oriented x3;  grossly normal neurologically. Skin:  Intact without significant lesions or rashes. No jaundice. Lymph Nodes:  No significant cervical adenopathy. Psych:  Alert and cooperative. Normal mood and affect.  Imaging Studies: No abdominal imaging  Assessment and Plan:   Kynadee Dam is a 64  y.o. female with morbid obesity, hypertension, hyperlipidemia, chronic GERD is seen in consultation for chronic GERD and chronic constipation  Chronic GERD Reiterated on healthy lifestyle Antireflux lifestyle, information provided Increase omeprazole to 40 mg 2 times a day before meals Recommend EGD for further evaluation, particularly to rule out erosive esophagitis which can be stress-induced  Chronic constipation High-fiber diet and fiber supplements, information provided Samples for fiber supplements given Discussed with patient about chronic inflammation in the TI from colonoscopy in 2014.  She says she will obtain the results of most recent colonoscopy in 2019 before proceeding with repeat colonoscopy for further evaluation   Follow up in 3 months   Cheryl Repress, MD

## 2019-08-29 NOTE — Patient Instructions (Signed)
Gastroesophageal Reflux Disease, Adult Gastroesophageal reflux (GER) happens when acid from the stomach flows up into the tube that connects the mouth and the stomach (esophagus). Normally, food travels down the esophagus and stays in the stomach to be digested. With GER, food and stomach acid sometimes move back up into the esophagus. You may have a disease called gastroesophageal reflux disease (GERD) if the reflux:  Happens often.  Causes frequent or very bad symptoms.  Causes problems such as damage to the esophagus. When this happens, the esophagus becomes sore and swollen (inflamed). Over time, GERD can make small holes (ulcers) in the lining of the esophagus. What are the causes? This condition is caused by a problem with the muscle between the esophagus and the stomach. When this muscle is weak or not normal, it does not close properly to keep food and acid from coming back up from the stomach. The muscle can be weak because of:  Tobacco use.  Pregnancy.  Having a certain type of hernia (hiatal hernia).  Alcohol use.  Certain foods and drinks, such as coffee, chocolate, onions, and peppermint. What increases the risk? You are more likely to develop this condition if you:  Are overweight.  Have a disease that affects your connective tissue.  Use NSAID medicines. What are the signs or symptoms? Symptoms of this condition include:  Heartburn.  Difficult or painful swallowing.  The feeling of having a lump in the throat.  A bitter taste in the mouth.  Bad breath.  Having a lot of saliva.  Having an upset or bloated stomach.  Belching.  Chest pain. Different conditions can cause chest pain. Make sure you see your doctor if you have chest pain.  Shortness of breath or noisy breathing (wheezing).  Ongoing (chronic) cough or a cough at night.  Wearing away of the surface of teeth (tooth enamel).  Weight loss. How is this treated? Treatment will depend on how  bad your symptoms are. Your doctor may suggest:  Changes to your diet.  Medicine.  Surgery. Follow these instructions at home: Eating and drinking   Follow a diet as told by your doctor. You may need to avoid foods and drinks such as: ? Coffee and tea (with or without caffeine). ? Drinks that contain alcohol. ? Energy drinks and sports drinks. ? Bubbly (carbonated) drinks or sodas. ? Chocolate and cocoa. ? Peppermint and mint flavorings. ? Garlic and onions. ? Horseradish. ? Spicy and acidic foods. These include peppers, chili powder, curry powder, vinegar, hot sauces, and BBQ sauce. ? Citrus fruit juices and citrus fruits, such as oranges, lemons, and limes. ? Tomato-based foods. These include red sauce, chili, salsa, and pizza with red sauce. ? Fried and fatty foods. These include donuts, french fries, potato chips, and high-fat dressings. ? High-fat meats. These include hot dogs, rib eye steak, sausage, ham, and bacon. ? High-fat dairy items, such as whole milk, butter, and cream cheese.  Eat small meals often. Avoid eating large meals.  Avoid drinking large amounts of liquid with your meals.  Avoid eating meals during the 2-3 hours before bedtime.  Avoid lying down right after you eat.  Do not exercise right after you eat. Lifestyle   Do not use any products that contain nicotine or tobacco. These include cigarettes, e-cigarettes, and chewing tobacco. If you need help quitting, ask your doctor.  Try to lower your stress. If you need help doing this, ask your doctor.  If you are overweight, lose an amount   of weight that is healthy for you. Ask your doctor about a safe weight loss goal. General instructions  Pay attention to any changes in your symptoms.  Take over-the-counter and prescription medicines only as told by your doctor. Do not take aspirin, ibuprofen, or other NSAIDs unless your doctor says it is okay.  Wear loose clothes. Do not wear anything tight  around your waist.  Raise (elevate) the head of your bed about 6 inches (15 cm).  Avoid bending over if this makes your symptoms worse.  Keep all follow-up visits as told by your doctor. This is important. Contact a doctor if:  You have new symptoms.  You lose weight and you do not know why.  You have trouble swallowing or it hurts to swallow.  You have wheezing or a cough that keeps happening.  Your symptoms do not get better with treatment.  You have a hoarse voice. Get help right away if:  You have pain in your arms, neck, jaw, teeth, or back.  You feel sweaty, dizzy, or light-headed.  You have chest pain or shortness of breath.  You throw up (vomit) and your throw-up looks like blood or coffee grounds.  You pass out (faint).  Your poop (stool) is bloody or black.  You cannot swallow, drink, or eat. Summary  If a person has gastroesophageal reflux disease (GERD), food and stomach acid move back up into the esophagus and cause symptoms or problems such as damage to the esophagus.  Treatment will depend on how bad your symptoms are.  Follow a diet as told by your doctor.  Take all medicines only as told by your doctor. This information is not intended to replace advice given to you by your health care provider. Make sure you discuss any questions you have with your health care provider. Document Revised: 01/12/2018 Document Reviewed: 01/12/2018 Elsevier Patient Education  [place]. High-Fiber Diet Fiber, also called dietary fiber, is a type of carbohydrate that is found in fruits, vegetables, whole grains, and beans. A high-fiber diet can have many health benefits. Your health care provider may recommend a high-fiber diet to help:  Prevent constipation. Fiber can make your bowel movements more regular.  Lower your cholesterol.  Relieve the following conditions: ? Swelling of veins in the anus (hemorrhoids). ? Swelling and irritation (inflammation) of  specific areas of the digestive tract (uncomplicated diverticulosis). ? A problem of the large intestine (colon) that sometimes causes pain and diarrhea (irritable bowel syndrome, IBS).  Prevent overeating as part of a weight-loss plan.  Prevent heart disease, type 2 diabetes, and certain cancers. What is my plan? The recommended daily fiber intake in grams (g) includes:  38 g for men age 50 or younger.  30 g for men over age 66.  46 g for women age 79 or younger.  21 g for women over age 38. You can get the recommended daily intake of dietary fiber by:  Eating a variety of fruits, vegetables, grains, and beans.  Taking a fiber supplement, if it is not possible to get enough fiber through your diet. What do I need to know about a high-fiber diet?  It is better to get fiber through food sources rather than from fiber supplements. There is not a lot of research about how effective supplements are.  Always check the fiber content on the nutrition facts label of any prepackaged food. Look for foods that contain 5 g of fiber or more per serving.  Talk with  a diet and nutrition specialist (dietitian) if you have questions about specific foods that are recommended or not recommended for your medical condition, especially if those foods are not listed below.  Gradually increase how much fiber you consume. If you increase your intake of dietary fiber too quickly, you may have bloating, cramping, or gas.  Drink plenty of water. Water helps you to digest fiber. What are tips for following this plan?  Eat a wide variety of high-fiber foods.  Make sure that half of the grains that you eat each day are whole grains.  Eat breads and cereals that are made with whole-grain flour instead of refined flour or white flour.  Eat brown rice, bulgur wheat, or millet instead of white rice.  Start the day with a breakfast that is high in fiber, such as a cereal that contains 5 g of fiber or more per  serving.  Use beans in place of meat in soups, salads, and pasta dishes.  Eat high-fiber snacks, such as berries, raw vegetables, nuts, and popcorn.  Choose whole fruits and vegetables instead of processed forms like juice or sauce. What foods can I eat?  Fruits Berries. Pears. Apples. Oranges. Avocado. Prunes and raisins. Dried figs. Vegetables Sweet potatoes. Spinach. Kale. Artichokes. Cabbage. Broccoli. Cauliflower. Green peas. Carrots. Squash. Grains Whole-grain breads. Multigrain cereal. Oats and oatmeal. Brown rice. Barley. Bulgur wheat. Millet. Quinoa. Bran muffins. Popcorn. Rye wafer crackers. Meats and other proteins Navy, kidney, and pinto beans. Soybeans. Split peas. Lentils. Nuts and seeds. Dairy Fiber-fortified yogurt. Beverages Fiber-fortified soy milk. Fiber-fortified orange juice. Other foods Fiber bars. The items listed above may not be a complete list of recommended foods and beverages. Contact a dietitian for more options. What foods are not recommended? Fruits Fruit juice. Cooked, strained fruit. Vegetables Fried potatoes. Canned vegetables. Well-cooked vegetables. Grains White bread. Pasta made with refined flour. White rice. Meats and other proteins Fatty cuts of meat. Fried chicken or fried fish. Dairy Milk. Yogurt. Cream cheese. Sour cream. Fats and oils Butters. Beverages Soft drinks. Other foods Cakes and pastries. The items listed above may not be a complete list of foods and beverages to avoid. Contact a dietitian for more information. Summary  Fiber is a type of carbohydrate. It is found in fruits, vegetables, whole grains, and beans.  There are many health benefits of eating a high-fiber diet, such as preventing constipation, lowering blood cholesterol, helping with weight loss, and reducing your risk of heart disease, diabetes, and certain cancers.  Gradually increase your intake of fiber. Increasing too fast can result in cramping,  bloating, and gas. Drink plenty of water while you increase your fiber.  The best sources of fiber include whole fruits and vegetables, whole grains, nuts, seeds, and beans. This information is not intended to replace advice given to you by your health care provider. Make sure you discuss any questions you have with your health care provider. Document Revised: 05/10/2017 Document Reviewed: 05/10/2017 Elsevier Patient Education  2020 [employer].

## 2019-09-14 ENCOUNTER — Ambulatory Visit: Payer: 59 | Admitting: Internal Medicine

## 2019-10-04 ENCOUNTER — Other Ambulatory Visit: Payer: Self-pay | Admitting: Internal Medicine

## 2019-10-04 ENCOUNTER — Telehealth: Payer: Self-pay | Admitting: Internal Medicine

## 2019-10-04 DIAGNOSIS — F419 Anxiety disorder, unspecified: Secondary | ICD-10-CM

## 2019-10-04 DIAGNOSIS — J4520 Mild intermittent asthma, uncomplicated: Secondary | ICD-10-CM

## 2019-10-04 MED ORDER — ALBUTEROL SULFATE HFA 108 (90 BASE) MCG/ACT IN AERS: 1.0000 | INHALATION_SPRAY | Freq: Four times a day (QID) | RESPIRATORY_TRACT | 11 refills | Status: DC | PRN

## 2019-10-04 MED ORDER — ALPRAZOLAM 0.25 MG PO TABS: 0.1250 mg | ORAL_TABLET | Freq: Every evening | ORAL | 1 refills | Status: DC | PRN

## 2019-10-04 NOTE — Telephone Encounter (Signed)
Okay for refills?   Spoke with patient about GI concern. Patient thought she had a colonoscopy in 2019 and wanted those records sent to her GI provider. Patient had a colonoscopy in 2014 and an endoscopy in 2019. Her Gi specialist already have these records. Nothing further needed on this issue.

## 2019-10-04 NOTE — Telephone Encounter (Signed)
Pt needs a refill on albuterol (PROVENTIL HFA;VENTOLIN HFA) 108 (90 Base) MCG/ACT inhaler  And ALPRAZolam (XANAX) 0.25 MG tablet. Pt also has a question about GI

## 2019-10-04 NOTE — Telephone Encounter (Signed)
Pt can discuss this with GI and have GI get the records from [place] again if there is a ? About records or when she had the procedure done   She can call clinic in [place] again and have all records GI sent to new GI doctor   TMS

## 2019-10-04 NOTE — Telephone Encounter (Signed)
Left message informing the patient that medication has been sent in.

## 2019-10-05 ENCOUNTER — Encounter: Payer: 59 | Admitting: Internal Medicine

## 2019-10-10 ENCOUNTER — Other Ambulatory Visit: Payer: Self-pay

## 2019-10-12 ENCOUNTER — Telehealth: Payer: Self-pay | Admitting: Gastroenterology

## 2019-10-12 ENCOUNTER — Encounter: Payer: 59 | Admitting: Internal Medicine

## 2019-10-12 ENCOUNTER — Encounter: Payer: Self-pay | Admitting: Gastroenterology

## 2019-10-12 ENCOUNTER — Other Ambulatory Visit: Payer: Self-pay

## 2019-10-12 NOTE — Telephone Encounter (Signed)
Called patient and patient would like to be moved to 11/02/2019 in [place]. Called Kim and got patient moved

## 2019-10-12 NOTE — Telephone Encounter (Signed)
Pt left vm she is not feeling well and would like to r/s her procedure for the 30th

## 2019-10-12 NOTE — Telephone Encounter (Signed)
pATIENT CALLED & NEEDS TO R/S HER EGD FROM 10-17-19 SHE IS NOT FEELING WELL.

## 2019-10-13 ENCOUNTER — Other Ambulatory Visit: Admission: RE | Admit: 2019-10-13 | Payer: 59 | Source: Ambulatory Visit

## 2019-10-13 ENCOUNTER — Telehealth: Payer: Self-pay

## 2019-10-13 NOTE — Telephone Encounter (Signed)
Returned patients call.  LVM for her to call office back if she would still like to reschedule her procedure.  Thanks,  [place], [place]

## 2019-10-19 ENCOUNTER — Encounter: Payer: Self-pay | Admitting: Anesthesiology

## 2019-10-23 ENCOUNTER — Ambulatory Visit: Payer: Self-pay

## 2019-10-27 ENCOUNTER — Ambulatory Visit: Payer: 59

## 2019-10-30 ENCOUNTER — Telehealth: Payer: Self-pay

## 2019-10-30 NOTE — Telephone Encounter (Signed)
Jeanna in ENDO states Just spoke to this patient. She said her symptoms have improved with the meds Dr Allegra Lai gave her and her dietary changes. She is going out of town until September. She wants to cancel for now. She will call when she returns in September. ( I gave her the [facility] phone number.)  They will canceled appointment

## 2019-10-31 ENCOUNTER — Other Ambulatory Visit: Admission: RE | Admit: 2019-10-31 | Payer: 59 | Source: Ambulatory Visit

## 2019-11-02 ENCOUNTER — Ambulatory Visit: Admission: RE | Admit: 2019-11-02 | Payer: 59 | Source: Home / Self Care | Admitting: Gastroenterology

## 2019-11-02 SURGERY — ESOPHAGOGASTRODUODENOSCOPY (EGD) WITH PROPOFOL
Anesthesia: Choice

## 2019-11-03 ENCOUNTER — Encounter: Payer: Self-pay | Admitting: Internal Medicine

## 2019-11-03 ENCOUNTER — Other Ambulatory Visit (HOSPITAL_COMMUNITY)
Admission: RE | Admit: 2019-11-03 | Discharge: 2019-11-03 | Disposition: A | Discharge status: Home / Self Care | Payer: 59 | Source: Ambulatory Visit | Attending: Internal Medicine | Admitting: Internal Medicine

## 2019-11-03 ENCOUNTER — Ambulatory Visit (INDEPENDENT_AMBULATORY_CARE_PROVIDER_SITE_OTHER): Payer: No Typology Code available for payment source | Admitting: Internal Medicine

## 2019-11-03 ENCOUNTER — Other Ambulatory Visit: Payer: Self-pay

## 2019-11-03 VITALS — BP 118/82 | HR 91 | Temp 96.8°F | Ht 64.0 in | Wt 233.2 lb

## 2019-11-03 DIAGNOSIS — B351 Tinea unguium: Secondary | ICD-10-CM

## 2019-11-03 DIAGNOSIS — Z0000 Encounter for general adult medical examination without abnormal findings: Secondary | ICD-10-CM | POA: Diagnosis not present

## 2019-11-03 DIAGNOSIS — J4520 Mild intermittent asthma, uncomplicated: Secondary | ICD-10-CM

## 2019-11-03 DIAGNOSIS — R7303 Prediabetes: Secondary | ICD-10-CM | POA: Diagnosis not present

## 2019-11-03 DIAGNOSIS — I10 Essential (primary) hypertension: Secondary | ICD-10-CM | POA: Diagnosis not present

## 2019-11-03 DIAGNOSIS — D72829 Elevated white blood cell count, unspecified: Secondary | ICD-10-CM

## 2019-11-03 DIAGNOSIS — Z124 Encounter for screening for malignant neoplasm of cervix: Secondary | ICD-10-CM | POA: Insufficient documentation

## 2019-11-03 DIAGNOSIS — E559 Vitamin D deficiency, unspecified: Secondary | ICD-10-CM | POA: Diagnosis not present

## 2019-11-03 DIAGNOSIS — F419 Anxiety disorder, unspecified: Secondary | ICD-10-CM

## 2019-11-03 DIAGNOSIS — K219 Gastro-esophageal reflux disease without esophagitis: Secondary | ICD-10-CM

## 2019-11-03 DIAGNOSIS — Z1231 Encounter for screening mammogram for malignant neoplasm of breast: Secondary | ICD-10-CM

## 2019-11-03 DIAGNOSIS — J309 Allergic rhinitis, unspecified: Secondary | ICD-10-CM

## 2019-11-03 DIAGNOSIS — E2839 Other primary ovarian failure: Secondary | ICD-10-CM

## 2019-11-03 DIAGNOSIS — R600 Localized edema: Secondary | ICD-10-CM

## 2019-11-03 DIAGNOSIS — Z1389 Encounter for screening for other disorder: Secondary | ICD-10-CM

## 2019-11-03 DIAGNOSIS — J329 Chronic sinusitis, unspecified: Secondary | ICD-10-CM

## 2019-11-03 HISTORY — DX: Tinea unguium: B35.1

## 2019-11-03 LAB — CBC WITH DIFFERENTIAL/PLATELET
Basophils Absolute: 0.1 K/uL (ref 0.0–0.1)
Basophils Relative: 0.6 % (ref 0.0–3.0)
Eosinophils Absolute: 0.2 K/uL (ref 0.0–0.7)
Eosinophils Relative: 1.7 % (ref 0.0–5.0)
HCT: 40.3 % (ref 36.0–46.0)
Hemoglobin: 13.7 g/dL (ref 12.0–15.0)
Lymphocytes Relative: 42.2 % (ref 12.0–46.0)
Lymphs Abs: 4.8 K/uL — ABNORMAL HIGH (ref 0.7–4.0)
MCHC: 34 g/dL (ref 30.0–36.0)
MCV: 79.2 fl (ref 78.0–100.0)
Monocytes Absolute: 0.7 K/uL (ref 0.1–1.0)
Monocytes Relative: 6.4 % (ref 3.0–12.0)
Neutro Abs: 5.6 K/uL (ref 1.4–7.7)
Neutrophils Relative %: 49.1 % (ref 43.0–77.0)
Platelets: 261 K/uL (ref 150.0–400.0)
RBC: 5.08 Mil/uL (ref 3.87–5.11)
RDW: 16.3 % — ABNORMAL HIGH (ref 11.5–15.5)
WBC: 11.4 K/uL — ABNORMAL HIGH (ref 4.0–10.5)

## 2019-11-03 LAB — COMPREHENSIVE METABOLIC PANEL WITH GFR
ALT: 17 U/L (ref 0–35)
AST: 18 U/L (ref 0–37)
Albumin: 4.6 g/dL (ref 3.5–5.2)
Alkaline Phosphatase: 64 U/L (ref 39–117)
BUN: 11 mg/dL (ref 6–23)
CO2: 26 meq/L (ref 19–32)
Calcium: 9.7 mg/dL (ref 8.4–10.5)
Chloride: 102 meq/L (ref 96–112)
Creatinine, Ser: 0.88 mg/dL (ref 0.40–1.20)
GFR: 64.43 mL/min
Glucose, Bld: 103 mg/dL — ABNORMAL HIGH (ref 70–99)
Potassium: 3.5 meq/L (ref 3.5–5.1)
Sodium: 139 meq/L (ref 135–145)
Total Bilirubin: 0.4 mg/dL (ref 0.2–1.2)
Total Protein: 8.6 g/dL — ABNORMAL HIGH (ref 6.0–8.3)

## 2019-11-03 LAB — URINALYSIS, ROUTINE W REFLEX MICROSCOPIC
Bacteria, UA: NONE SEEN /HPF
Bilirubin Urine: NEGATIVE
Glucose, UA: NEGATIVE
Hgb urine dipstick: NEGATIVE
Hyaline Cast: NONE SEEN /LPF
Ketones, ur: NEGATIVE
Leukocytes,Ua: NEGATIVE
Nitrite: NEGATIVE
Protein, ur: NEGATIVE
RBC / HPF: NONE SEEN /HPF (ref 0–2)
Specific Gravity, Urine: 1.01 (ref 1.001–1.03)
Squamous Epithelial / HPF: NONE SEEN /HPF
WBC, UA: NONE SEEN /HPF (ref 0–5)
pH: 7 (ref 5.0–8.0)

## 2019-11-03 LAB — LIPID PANEL
Cholesterol: 174 mg/dL (ref 0–200)
HDL: 65.6 mg/dL
LDL Cholesterol: 92 mg/dL (ref 0–99)
NonHDL: 108.39
Total CHOL/HDL Ratio: 3
Triglycerides: 84 mg/dL (ref 0.0–149.0)
VLDL: 16.8 mg/dL (ref 0.0–40.0)

## 2019-11-03 LAB — VITAMIN D 25 HYDROXY (VIT D DEFICIENCY, FRACTURES): VITD: 51.64 ng/mL (ref 30.00–100.00)

## 2019-11-03 LAB — HEMOGLOBIN A1C: Hgb A1c MFr Bld: 5.6 % (ref 4.6–6.5)

## 2019-11-03 MED ORDER — DILTIAZEM HCL ER COATED BEADS 120 MG PO CP24: 120.0000 mg | ORAL_CAPSULE | Freq: Every day | ORAL | 3 refills | Status: DC

## 2019-11-03 MED ORDER — ALPRAZOLAM 0.25 MG PO TABS: 0.1250 mg | ORAL_TABLET | Freq: Every evening | ORAL | 2 refills | Status: DC | PRN

## 2019-11-03 MED ORDER — SALINE SPRAY 0.65 % NA SOLN: 1.0000 | NASAL | 11 refills | Status: DC | PRN

## 2019-11-03 MED ORDER — FUROSEMIDE 20 MG PO TABS: 10.0000 mg | ORAL_TABLET | Freq: Every day | ORAL | 5 refills | Status: DC | PRN

## 2019-11-03 MED ORDER — FLUTICASONE-SALMETEROL 100-50 MCG/DOSE IN AEPB: 1.0000 | INHALATION_SPRAY | Freq: Two times a day (BID) | RESPIRATORY_TRACT | 12 refills | Status: DC

## 2019-11-03 NOTE — Progress Notes (Signed)
Chief Complaint  Patient presents with  . Annual Exam  . Gynecologic Exam   Annual doing well  1. Pap today  2. HTN controlled on dilt 120 lasix 20 mg  3. gerd on prilosec 40 mg qd 4. Sinus issues saw ENT flonase helped and zpak helped and could not tolerate prednisone    Review of Systems  Constitutional: Negative for weight loss.  HENT: Negative for hearing loss.   Eyes: Negative for blurred vision.  Respiratory: Negative for shortness of breath.   Cardiovascular: Negative for chest pain.  Gastrointestinal: Negative for abdominal pain.  Musculoskeletal: Negative for falls.  Skin: Negative for rash.  Neurological: Negative for headaches.  Psychiatric/Behavioral: Negative for depression.   Past Medical History:  Diagnosis Date  . Arthritis   . Asthma   . Diverticulitis   . GERD (gastroesophageal reflux disease)   . Hiatal hernia   . History of blood transfusion   . History of chicken pox   . History of GI bleed    2013/14  . Hyperlipidemia   . Hypertension    Past Surgical History:  Procedure Laterality Date  . BREAST SURGERY     bx dense breast nl per pt   . CESAREAN SECTION     04/27/84   Family History  Problem Relation Age of Onset  . Hypertension Maternal Aunt   . Diabetes Maternal Aunt   . Breast cancer Neg Hx    Social History   Socioeconomic History  . Marital status: Married    Spouse name: Not on file  . Number of children: Not on file  . Years of education: Not on file  . Highest education level: Not on file  Occupational History  . Not on file  Tobacco Use  . Smoking status: Never Smoker  . Smokeless tobacco: Never Used  Substance and Sexual Activity  . Alcohol use: Not Currently  . Drug use: Not Currently  . Sexual activity: Yes    Comment: husband   Other Topics Concern  . Not on file  Social History Narrative   Lived in [place] from [place]    Married 1st husband died in late 84s/2000s   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, wears seat  belt, safe in relationship    Social Determinants of Health   Financial Resource Strain:   . Difficulty of Paying Living Expenses:   Food Insecurity:   . Worried About Charity fundraiser in the Last Year:   . Arboriculturist in the Last Year:   Transportation Needs:   . Film/video editor (Medical):   Marland Kitchen Lack of Transportation (Non-Medical):   Physical Activity:   . Days of Exercise per Week:   . Minutes of Exercise per Session:   Stress:   . Feeling of Stress :   Social Connections:   . Frequency of Communication with Friends and Family:   . Frequency of Social Gatherings with Friends and Family:   . Attends Religious Services:   . Active Member of Clubs or Organizations:   . Attends Archivist Meetings:   Marland Kitchen Marital Status:   Intimate Partner Violence:   . Fear of Current or Ex-Partner:   . Emotionally Abused:   Marland Kitchen Physically Abused:   . Sexually Abused:    Current Meds  Medication Sig  . albuterol (VENTOLIN HFA) 108 (90 Base) MCG/ACT inhaler Inhale 1-2 puffs into the lungs every 6 (six) hours  as needed.  . ALPRAZolam (XANAX) 0.25 MG tablet Take 0.5-1 tablets (0.125-0.25 mg total) by mouth at bedtime as needed for anxiety.  Marland Kitchen atorvastatin (LIPITOR) 10 MG tablet Take 1 tablet (10 mg total) by mouth daily at 6 PM.  . ciclopirox (PENLAC) 8 % solution Apply topically at bedtime. Apply over nail and surrounding skin. Apply daily over previous coat. After seven (7) days, may remove with alcohol and continue cycle use up to 48 weeks  . diltiazem (CARDIZEM CD) 120 MG 24 hr capsule Take 1 capsule (120 mg total) by mouth daily.  . fluticasone (FLONASE) 50 MCG/ACT nasal spray Place into both nostrils daily.  . Fluticasone-Salmeterol (ADVAIR DISKUS) 100-50 MCG/DOSE AEPB Inhale 1 puff into the lungs in the morning and at bedtime. RINSE MOUTH.NOT GENERIC  . furosemide (LASIX) 20 MG tablet Take 0.5 tablets (10 mg total) by mouth daily as needed.  . montelukast (SINGULAIR) 10 MG  tablet Take 1 tablet (10 mg total) by mouth at bedtime.  Marland Kitchen nystatin (MYCOSTATIN) 100000 UNIT/ML suspension Take 5 mLs (500,000 Units total) by mouth 4 (four) times daily. X 7-10 days  . omeprazole (PRILOSEC) 40 MG capsule Take 1 capsule (40 mg total) by mouth 2 (two) times daily.  Marland Kitchen Specialty Vitamins Products (WOMENS VITA PAK PO) Take by mouth daily.   Allergies  Allergen Reactions  . Oxycodone Other (See Comments)    Feels bad   No results found for this or any previous visit (from the past 2160 hour(s)). Objective  Body mass index is 40.03 kg/m. Wt Readings from Last 3 Encounters:  11/03/19 233 lb 3.2 oz (105.8 kg)  08/29/19 236 lb (107 kg)  06/06/19 210 lb (95.3 kg)   Temp Readings from Last 3 Encounters:  11/03/19 (!) 96.8 F (36 C) (Temporal)  08/29/19 97.8 F (36.6 C) (Oral)  10/06/18 98.4 F (36.9 C) (Oral)   BP Readings from Last 3 Encounters:  11/03/19 118/82  08/29/19 128/76  10/06/18 134/66   Pulse Readings from Last 3 Encounters:  11/03/19 91  08/29/19 93  10/06/18 95    Physical Exam Vitals and nursing note reviewed. Exam conducted with a chaperone present.  Constitutional:      Appearance: Normal appearance. She is well-developed and well-groomed. She is morbidly obese.  HENT:     Head: Normocephalic and atraumatic.  Eyes:     Conjunctiva/sclera: Conjunctivae normal.     Pupils: Pupils are equal, round, and reactive to light.  Cardiovascular:     Rate and Rhythm: Normal rate and regular rhythm.     Heart sounds: Normal heart sounds.  Pulmonary:     Effort: Pulmonary effort is normal.     Breath sounds: Normal breath sounds.  Chest:     Breasts: Breasts are symmetrical.        Right: Normal.        Left: Normal.  Abdominal:     General: Abdomen is flat. Bowel sounds are normal.     Tenderness: There is no abdominal tenderness.  Genitourinary:    Exam position: Supine.     Pubic Area: No rash.      Labia:        Right: No rash.         Left: No rash.      Vagina: Normal.     Cervix: Normal.     Uterus: Normal.      Adnexa: Right adnexa normal and left adnexa normal.  Lymphadenopathy:     Upper  Body:     Right upper body: No axillary adenopathy.     Left upper body: No axillary adenopathy.  Skin:    General: Skin is warm and dry.  Neurological:     General: No focal deficit present.     Mental Status: She is alert and oriented to person, place, and time. Mental status is at baseline.     Gait: Gait normal.  Psychiatric:        Attention and Perception: Attention and perception normal.        Mood and Affect: Mood and affect normal.        Speech: Speech normal.        Behavior: Behavior normal. Behavior is cooperative.        Thought Content: Thought content normal.        Cognition and Memory: Cognition and memory normal.        Judgment: Judgment normal.     Assessment  Plan  Annual physical exam Flu shot utd  Tdap check records ny  pna had 2019 ny  Consider shingrix in future  Considering covid 2/2   03/22/19 mammo normal   [place]  colonoscopy 10/15/06 mod to severe diverticulosis [facility] f/u in 5 years  EGD 11/29/07 candida, mild gastritis  EGD 01/03/13/colonoscopy diverticulosis ext and int hemorrhoids TI with ulceration neg H pylori UGD 02/01/18 gastritis? If had colonoscopy  GI established [facility]   Pap today   DEXA consider age 15 referred   Essential hypertension - Plan: Comprehensive metabolic panel, CBC w/Diff, Lipid panel, diltiazem (CARDIZEM CD) 120 MG 24 hr capsule, furosemide (LASIX) 20 MG tablet  Prediabetes - Plan: Hemoglobin A1c  Vitamin D deficiency - Plan: Vitamin D (25 hydroxy)  Gastroesophageal reflux disease without esophagitis  Anxiety - Plan: ALPRAZolam (XANAX) 0.25 MG tablet  Mild intermittent asthma without complication - Plan: Fluticasone-Salmeterol (ADVAIR DISKUS) 100-50 MCG/DOSE AEPB  Leg edema - Plan: furosemide (LASIX) 20 MG  tablet  Allergic rhinitis, unspecified seasonality, unspecified trigger - Plan: sodium chloride (OCEAN) 0.65 % SOLN nasal spray  Sinusitis, unspecified chronicity, unspecified location   Onychomycosis  Call back when ready for lamisil orally in 02/2020 will send in    Provider: Dr. French Ana [name]

## 2019-11-03 NOTE — Patient Instructions (Addendum)
COVID-19 Vaccine Information can be found at: [email or web address] For questions related to vaccine distribution or appointments, please email [email or web address] or call [phone].    Call [facility] 03/21/20 to schedule mammogram and bone density  Take care

## 2019-11-06 NOTE — Addendum Note (Signed)
Addended by: Quentin Ore on: 11/06/2019 05:30 PM   Modules accepted: Orders

## 2019-11-07 LAB — CYTOLOGY - PAP
Comment: NEGATIVE
Diagnosis: NEGATIVE
High risk HPV: NEGATIVE

## 2019-11-08 ENCOUNTER — Telehealth: Payer: Self-pay | Admitting: Internal Medicine

## 2019-11-08 NOTE — Telephone Encounter (Signed)
Please advise 

## 2019-11-08 NOTE — Telephone Encounter (Signed)
We can Rx antibiotic if she is agreeable to a telephone visit/charge for now?   She can try warm salt water gargles with dilute hydrogen peroxide  Will need to schedule with dental as I cant help otherwise  Can cancel appt if agreeable telephone charge  TMS

## 2019-11-08 NOTE — Telephone Encounter (Signed)
Left detailed message of the below and that pt does not need that appointment. Will cancel appointment and await return call on if patient is agreeable to telephone consult fee.   Appointment canceled.

## 2019-11-08 NOTE — Telephone Encounter (Signed)
Pt called in and said she saw Dr. French Ana on 11/03/19 and when she woke up Saturday her gums seem infected and hurting. She tried to get in with her dentist but they don't have any availability until next week. I went ahead and scheduled her an appt for tomorrow 11/09/19 @ 3:30. She still wants someone to call her today b/c she possibly might not need an appt just medication. I let her know she probably would still need an appointment but I will have someone call her back.

## 2019-11-09 ENCOUNTER — Telehealth (INDEPENDENT_AMBULATORY_CARE_PROVIDER_SITE_OTHER): Payer: No Typology Code available for payment source | Admitting: Internal Medicine

## 2019-11-09 ENCOUNTER — Other Ambulatory Visit: Payer: Self-pay | Admitting: Internal Medicine

## 2019-11-09 ENCOUNTER — Ambulatory Visit: Payer: 59 | Admitting: Internal Medicine

## 2019-11-09 DIAGNOSIS — K047 Periapical abscess without sinus: Secondary | ICD-10-CM

## 2019-11-09 DIAGNOSIS — B3731 Acute candidiasis of vulva and vagina: Secondary | ICD-10-CM

## 2019-11-09 DIAGNOSIS — B373 Candidiasis of vulva and vagina: Secondary | ICD-10-CM

## 2019-11-09 MED ORDER — FLUCONAZOLE 150 MG PO TABS: 150.0000 mg | ORAL_TABLET | Freq: Once | ORAL | 0 refills | Status: AC

## 2019-11-09 MED ORDER — AMOXICILLIN-POT CLAVULANATE 875-125 MG PO TABS: 1.0000 | ORAL_TABLET | Freq: Two times a day (BID) | ORAL | 0 refills | Status: DC

## 2019-11-09 NOTE — Telephone Encounter (Signed)
Pt is agreeable to telephone consult fee and would like antibiotic sent in to [facility] in [place]. She is not allergic to amoxicillin.

## 2019-11-09 NOTE — Telephone Encounter (Signed)
Pt is agreeable to telephone consult fee and would like antibiotic sent in to [facility] in [place]. She is not allergic to amoxicillin.       Documentation    Allie Bossier L routed conversation to Tilford Pillar, CMA 2 hours ago (3:31 PM)  Allie Bossier L 2 hours ago (3:31 PM)  MH   Please call pt back asap      Documentation   [place], [place] [phone]  Allie Bossier L 2 hours ago (3:30 PM)  Tilford Pillar, CMA Yesterday (4:57 PM)     Left detailed message of the below and that pt does not need that appointment. Will cancel appointment and await return call on if patient is agreeable to telephone consult fee.   Appointment canceled.       Documentation    You  Tilford Pillar, CMA Yesterday (4:49 PM)  TM   We can Rx antibiotic if she is agreeable to a telephone visit/charge for now?   She can try warm salt water gargles with dilute hydrogen peroxide  Will need to schedule with dental as I cant help otherwise  Can cancel appt if agreeable telephone charge  TMS      Documentation    Tilford Pillar, CMA  You Yesterday (4:36 PM)     Please advise        Documentation    Ninfa Meeker H routed conversation to Tilford Pillar, CMA Yesterday (9:51 AM)  Horris Latino Yesterday (9:51 AM)  BP   Pt called in and said she saw Dr. French Ana on 11/03/19 and when she woke up Saturday her gums seem infected and hurting. She tried to get in with her dentist but they don't have any availability until next week. I went ahead and scheduled her an appt for tomorrow 11/09/19 @ 3:30. She still wants someone to call her today b/c she possibly might not need an appt just medication. I let her know she probably would still need an appointment but I will have someone call her back.       A/p Dental abscess  Rx Augmentin bid x 7 days  See above rec f/u dental  Diflucan after abx complete   Agreeable to telephone consult  TMS Time 5-10 min

## 2019-11-09 NOTE — Telephone Encounter (Signed)
Please call pt back asap

## 2019-11-23 ENCOUNTER — Other Ambulatory Visit: Payer: Self-pay | Admitting: Gastroenterology

## 2019-12-07 ENCOUNTER — Other Ambulatory Visit: Payer: Self-pay | Admitting: Gastroenterology

## 2019-12-14 ENCOUNTER — Other Ambulatory Visit: Payer: Self-pay | Admitting: Internal Medicine

## 2019-12-14 DIAGNOSIS — J4520 Mild intermittent asthma, uncomplicated: Secondary | ICD-10-CM

## 2019-12-14 MED ORDER — MONTELUKAST SODIUM 10 MG PO TABS: 10.0000 mg | ORAL_TABLET | Freq: Every day | ORAL | 0 refills | Status: DC

## 2020-01-10 ENCOUNTER — Encounter: Payer: Self-pay | Admitting: Internal Medicine

## 2020-03-08 ENCOUNTER — Other Ambulatory Visit: Payer: Self-pay | Admitting: Internal Medicine

## 2020-03-08 DIAGNOSIS — E785 Hyperlipidemia, unspecified: Secondary | ICD-10-CM

## 2020-03-08 MED ORDER — ATORVASTATIN CALCIUM 10 MG PO TABS: 10.0000 mg | ORAL_TABLET | Freq: Every day | ORAL | 3 refills | Status: DC

## 2020-03-13 ENCOUNTER — Other Ambulatory Visit: Payer: Self-pay | Admitting: Gastroenterology

## 2020-03-13 NOTE — Telephone Encounter (Signed)
Last office visit 08/29/2019 GERD  Last refill 12/07/2019 1 refills  No appointment is scheduled

## 2020-03-15 ENCOUNTER — Other Ambulatory Visit: Payer: Self-pay | Admitting: Internal Medicine

## 2020-03-15 DIAGNOSIS — J4520 Mild intermittent asthma, uncomplicated: Secondary | ICD-10-CM

## 2020-03-15 MED ORDER — MONTELUKAST SODIUM 10 MG PO TABS: 10.0000 mg | ORAL_TABLET | Freq: Every day | ORAL | 1 refills | Status: DC

## 2020-04-02 ENCOUNTER — Ambulatory Visit
Admission: RE | Admit: 2020-04-02 | Discharge: 2020-04-02 | Disposition: A | Discharge status: Home / Self Care | Payer: No Typology Code available for payment source | Source: Ambulatory Visit | Attending: Internal Medicine | Admitting: Internal Medicine

## 2020-04-02 ENCOUNTER — Other Ambulatory Visit: Payer: Self-pay

## 2020-04-02 ENCOUNTER — Other Ambulatory Visit: Payer: Self-pay | Admitting: Gastroenterology

## 2020-04-02 DIAGNOSIS — Z1231 Encounter for screening mammogram for malignant neoplasm of breast: Secondary | ICD-10-CM

## 2020-04-02 DIAGNOSIS — E2839 Other primary ovarian failure: Secondary | ICD-10-CM | POA: Insufficient documentation

## 2020-04-24 ENCOUNTER — Other Ambulatory Visit: Payer: Self-pay

## 2020-04-24 DIAGNOSIS — I10 Essential (primary) hypertension: Secondary | ICD-10-CM

## 2020-04-24 MED ORDER — DILTIAZEM HCL ER COATED BEADS 120 MG PO CP24: 120.0000 mg | ORAL_CAPSULE | Freq: Every day | ORAL | 3 refills | Status: DC

## 2020-05-09 ENCOUNTER — Ambulatory Visit (INDEPENDENT_AMBULATORY_CARE_PROVIDER_SITE_OTHER): Payer: No Typology Code available for payment source | Admitting: Internal Medicine

## 2020-05-09 ENCOUNTER — Encounter: Payer: Self-pay | Admitting: Internal Medicine

## 2020-05-09 ENCOUNTER — Other Ambulatory Visit: Payer: Self-pay

## 2020-05-09 VITALS — BP 108/64 | HR 94 | Temp 98.3°F | Ht 64.0 in | Wt 230.2 lb

## 2020-05-09 DIAGNOSIS — R600 Localized edema: Secondary | ICD-10-CM

## 2020-05-09 DIAGNOSIS — J4520 Mild intermittent asthma, uncomplicated: Secondary | ICD-10-CM

## 2020-05-09 DIAGNOSIS — F419 Anxiety disorder, unspecified: Secondary | ICD-10-CM

## 2020-05-09 DIAGNOSIS — R300 Dysuria: Secondary | ICD-10-CM | POA: Diagnosis not present

## 2020-05-09 DIAGNOSIS — M8580 Other specified disorders of bone density and structure, unspecified site: Secondary | ICD-10-CM | POA: Diagnosis not present

## 2020-05-09 DIAGNOSIS — L304 Erythema intertrigo: Secondary | ICD-10-CM

## 2020-05-09 DIAGNOSIS — I10 Essential (primary) hypertension: Secondary | ICD-10-CM | POA: Diagnosis not present

## 2020-05-09 LAB — COMPREHENSIVE METABOLIC PANEL WITH GFR
ALT: 14 U/L (ref 0–35)
AST: 16 U/L (ref 0–37)
Albumin: 4.4 g/dL (ref 3.5–5.2)
Alkaline Phosphatase: 61 U/L (ref 39–117)
BUN: 8 mg/dL (ref 6–23)
CO2: 31 meq/L (ref 19–32)
Calcium: 9.6 mg/dL (ref 8.4–10.5)
Chloride: 103 meq/L (ref 96–112)
Creatinine, Ser: 0.88 mg/dL (ref 0.40–1.20)
GFR: 68.79 mL/min
Glucose, Bld: 93 mg/dL (ref 70–99)
Potassium: 4.1 meq/L (ref 3.5–5.1)
Sodium: 142 meq/L (ref 135–145)
Total Bilirubin: 0.5 mg/dL (ref 0.2–1.2)
Total Protein: 7.9 g/dL (ref 6.0–8.3)

## 2020-05-09 LAB — LIPID PANEL
Cholesterol: 157 mg/dL (ref 0–200)
HDL: 56.5 mg/dL
LDL Cholesterol: 82 mg/dL (ref 0–99)
NonHDL: 100.39
Total CHOL/HDL Ratio: 3
Triglycerides: 91 mg/dL (ref 0.0–149.0)
VLDL: 18.2 mg/dL (ref 0.0–40.0)

## 2020-05-09 LAB — CBC WITH DIFFERENTIAL/PLATELET
Basophils Absolute: 0.1 K/uL (ref 0.0–0.1)
Basophils Relative: 0.6 % (ref 0.0–3.0)
Eosinophils Absolute: 0.2 K/uL (ref 0.0–0.7)
Eosinophils Relative: 2 % (ref 0.0–5.0)
HCT: 39.6 % (ref 36.0–46.0)
Hemoglobin: 13.5 g/dL (ref 12.0–15.0)
Lymphocytes Relative: 43.9 % (ref 12.0–46.0)
Lymphs Abs: 4.9 K/uL — ABNORMAL HIGH (ref 0.7–4.0)
MCHC: 34.1 g/dL (ref 30.0–36.0)
MCV: 78.5 fl (ref 78.0–100.0)
Monocytes Absolute: 0.8 K/uL (ref 0.1–1.0)
Monocytes Relative: 7.4 % (ref 3.0–12.0)
Neutro Abs: 5.1 K/uL (ref 1.4–7.7)
Neutrophils Relative %: 46.1 % (ref 43.0–77.0)
Platelets: 246 K/uL (ref 150.0–400.0)
RBC: 5.04 Mil/uL (ref 3.87–5.11)
RDW: 15.4 % (ref 11.5–15.5)
WBC: 11.2 K/uL — ABNORMAL HIGH (ref 4.0–10.5)

## 2020-05-09 MED ORDER — MONTELUKAST SODIUM 10 MG PO TABS: 10.0000 mg | ORAL_TABLET | Freq: Every day | ORAL | 3 refills | Status: DC

## 2020-05-09 MED ORDER — ALPRAZOLAM 0.25 MG PO TABS: 0.1250 mg | ORAL_TABLET | Freq: Every evening | ORAL | 2 refills | Status: DC | PRN

## 2020-05-09 MED ORDER — FUROSEMIDE 20 MG PO TABS: 10.0000 mg | ORAL_TABLET | Freq: Every day | ORAL | 5 refills | Status: DC | PRN

## 2020-05-09 NOTE — Patient Instructions (Addendum)
Calcium 600 mg 1-2 x per day  clotrimazole 2x per day with hydrocortisone 1% 2x per day  Gold bond talc free powder antifungal powder   Intertrigo Intertrigo is skin irritation or inflammation (dermatitis) that occurs when folds of skin rub together. The irritation can cause a rash and make skin raw and itchy. This condition most commonly occurs in the skin folds of these areas:  Toes.  Armpits.  Groin.  Under the belly.  Under the breasts.  Buttocks. Intertrigo is not passed from person to person (is not contagious). What are the causes? This condition is caused by heat, moisture, rubbing (friction), and not enough air circulation. The condition can be made worse by:  Sweat.  Bacteria.  A fungus, such as yeast. What increases the risk? This condition is more likely to occur if you have moisture in your skin folds. You are more likely to develop this condition if you:  Have diabetes.  Are overweight.  Are not able to move around or are not active.  Live in a warm and moist climate.  Wear splints, braces, or other medical devices.  Are not able to control your bowels or bladder (have incontinence). What are the signs or symptoms? Symptoms of this condition include:  A pink or red skin rash in the skin fold or near the skin fold.  Raw or scaly skin.  Itchiness.  A burning feeling.  Bleeding.  Leaking fluid.  A bad smell. How is this diagnosed? This condition is diagnosed with a medical history and physical exam. You may also have a skin swab to test for bacteria or a fungus. How is this treated? This condition may be treated by:  Cleaning and drying your skin.  Taking an antibiotic medicine or using an antibiotic skin cream for a bacterial infection.  Using an antifungal cream on your skin or taking pills for an infection that was caused by a fungus, such as yeast.  Using a steroid ointment to relieve itchiness and irritation.  Separating the skin  fold with a clean cotton cloth to absorb moisture and allow air to flow into the area. Follow these instructions at home:  Keep the affected area clean and dry.  Do not scratch your skin.  Stay in a cool environment as much as possible. Use an air conditioner or fan, if available.  Apply over-the-counter and prescription medicines only as told by your health care provider.  If you were prescribed an antibiotic medicine, use it as told by your health care provider. Do not stop using the antibiotic even if your condition improves.  Keep all follow-up visits as told by your health care provider. This is important. How is this prevented?   Maintain a healthy weight.  Take care of your feet, especially if you have diabetes. Foot care includes: ? Wearing shoes that fit well. ? Keeping your feet dry. ? Wearing clean, breathable socks.  Protect the skin around your groin and buttocks, especially if you have incontinence. Skin protection includes: ? Following a regular cleaning routine. ? Using skin protectant creams, powders, or ointments. ? Changing protection pads frequently.  Do not wear tight clothes. Wear clothes that are loose, absorbent, and made of cotton.  Wear a bra that gives good support, if needed.  Shower and dry yourself well after activity or exercise. Use a hair dryer on a cool setting to dry between skin folds, especially after you bathe.  If you have diabetes, keep your blood sugar under control.  Contact a health care provider if:  Your symptoms do not improve with treatment.  Your symptoms get worse or they spread.  You notice increased redness and warmth.  You have a fever. Summary  Intertrigo is skin irritation or inflammation (dermatitis) that occurs when folds of skin rub together.  This condition is caused by heat, moisture, rubbing (friction), and not enough air circulation.  This condition may be treated by cleaning and drying your skin and with  medicines.  Apply over-the-counter and prescription medicines only as told by your health care provider.  Keep all follow-up visits as told by your health care provider. This is important. This information is not intended to replace advice given to you by your health care provider. Make sure you discuss any questions you have with your health care provider. Document Revised: 12/06/2017 Document Reviewed: 12/06/2017 Elsevier Patient Education  2020 [employer].

## 2020-05-09 NOTE — Progress Notes (Signed)
Chief Complaint  Patient presents with  . Follow-up  . Immunizations   F/u  1. HTN dilt 120 mg qd lasix 20 mg qd  2. Declines flu shot will get at the pharmacy  3. Intertrigo left breast itching tried otc af cream but did not help but not using consistently  4. Trouble with urination and felt like uti wants urine checked   Review of Systems  Constitutional: Positive for weight loss.  HENT: Negative for hearing loss.   Eyes: Negative for blurred vision.  Respiratory: Negative for shortness of breath.   Cardiovascular: Negative for chest pain.  Gastrointestinal: Negative for abdominal pain.  Musculoskeletal: Negative for falls and joint pain.  Skin: Positive for itching and rash.  Neurological: Negative for headaches.  Psychiatric/Behavioral: Negative for depression and memory loss.   Past Medical History:  Diagnosis Date  . Arthritis   . Asthma   . Diverticulitis   . GERD (gastroesophageal reflux disease)   . Hiatal hernia   . History of blood transfusion   . History of chicken pox   . History of GI bleed    2013/14  . Hyperlipidemia   . Hypertension    Past Surgical History:  Procedure Laterality Date  . BREAST BIOPSY    . BREAST SURGERY     bx dense breast nl per pt   . CESAREAN SECTION     04/27/84   Family History  Problem Relation Age of Onset  . Hypertension Maternal Aunt   . Diabetes Maternal Aunt   . Breast cancer Neg Hx    Social History   Socioeconomic History  . Marital status: Married    Spouse name: Not on file  . Number of children: Not on file  . Years of education: Not on file  . Highest education level: Not on file  Occupational History  . Not on file  Tobacco Use  . Smoking status: Never Smoker  . Smokeless tobacco: Never Used  Vaping Use  . Vaping Use: Never used  Substance and Sexual Activity  . Alcohol use: Not Currently  . Drug use: Not Currently  . Sexual activity: Yes    Comment: husband   Other Topics Concern  . 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 Difficulty of Paying Living Expenses: Not on file  Food Insecurity:   . Worried About Programme researcher, broadcasting/film/video in the Last Year: Not on file  . Ran Out of Food in the Last Year: Not on file  Transportation Needs:   . Lack of Transportation (Medical): Not on file  . Lack of Transportation (Non-Medical): Not on file  Physical Activity:   . Days of Exercise per Week: Not on file  . Minutes of Exercise per Session: Not on file  Stress:   . Feeling of Stress : Not on file  Social Connections:   . Frequency of Communication with Friends and Family: Not on file  . Frequency of Social Gatherings with Friends and Family: Not on file  . Attends Religious Services: Not on file  . Active Member of Clubs or Organizations: Not on file  . Attends Banker Meetings: Not on file  .  Marital Status: Not on file  Intimate Partner Violence:   . Fear of Current or Ex-Partner: Not on file  . Emotionally Abused: Not on file  . Physically Abused: Not on file  . Sexually Abused: Not on file   Current Meds  Medication Sig  . albuterol (VENTOLIN HFA) 108 (90 Base) MCG/ACT inhaler Inhale 1-2 puffs into the lungs every 6 (six) hours as needed.  . ALPRAZolam (XANAX) 0.25 MG tablet Take 0.5-1 tablets (0.125-0.25 mg total) by mouth at bedtime as needed for anxiety.  Marland Kitchen atorvastatin (LIPITOR) 10 MG tablet Take 1 tablet (10 mg total) by mouth daily at 6 PM.  . CALCIUM PO Take by mouth.  . ciclopirox (PENLAC) 8 % solution Apply topically at bedtime. Apply over nail and surrounding skin. Apply daily over previous coat. After seven (7) days, may remove with alcohol and continue cycle use up to 48 weeks  . diltiazem (CARDIZEM CD) 120 MG 24 hr capsule Take 1 capsule  (120 mg total) by mouth daily.  . fluticasone (FLONASE) 50 MCG/ACT nasal spray Place into both nostrils daily.  . Fluticasone-Salmeterol (ADVAIR DISKUS) 100-50 MCG/DOSE AEPB Inhale 1 puff into the lungs in the morning and at bedtime. RINSE MOUTH.NOT GENERIC  . furosemide (LASIX) 20 MG tablet Take 0.5 tablets (10 mg total) by mouth daily as needed.  . montelukast (SINGULAIR) 10 MG tablet Take 1 tablet (10 mg total) by mouth at bedtime.  Marland Kitchen nystatin (MYCOSTATIN) 100000 UNIT/ML suspension Take 5 mLs (500,000 Units total) by mouth 4 (four) times daily. X 7-10 days  . omeprazole (PRILOSEC) 40 MG capsule TAKE 1 CAPSULE BY MOUTH TWICE A DAY  . sodium chloride (OCEAN) 0.65 % SOLN nasal spray Place 1 spray into both nostrils as needed for congestion.  Marland Kitchen Specialty Vitamins Products (WOMENS VITA PAK PO) Take by mouth daily.  Marland Kitchen VITAMIN D PO Take by mouth.  . [DISCONTINUED] ALPRAZolam (XANAX) 0.25 MG tablet Take 0.5-1 tablets (0.125-0.25 mg total) by mouth at bedtime as needed for anxiety.  . [DISCONTINUED] furosemide (LASIX) 20 MG tablet Take 0.5 tablets (10 mg total) by mouth daily as needed.  . [DISCONTINUED] montelukast (SINGULAIR) 10 MG tablet Take 1 tablet (10 mg total) by mouth at bedtime.   Allergies  Allergen Reactions  . Oxycodone Other (See Comments)    Feels bad   No results found for this or any previous visit (from the past 2160 hour(s)). Objective  Body mass index is 39.51 kg/m. Wt Readings from Last 3 Encounters:  05/09/20 230 lb 3.2 oz (104.4 kg)  11/03/19 233 lb 3.2 oz (105.8 kg)  08/29/19 236 lb (107 kg)   Temp Readings from Last 3 Encounters:  05/09/20 98.3 F (36.8 C) (Oral)  11/03/19 (!) 96.8 F (36 C) (Temporal)  08/29/19 97.8 F (36.6 C) (Oral)   BP Readings from Last 3 Encounters:  05/09/20 108/64  11/03/19 118/82  08/29/19 128/76   Pulse Readings from Last 3 Encounters:  05/09/20 94  11/03/19 91  08/29/19 93    Physical Exam Vitals and nursing note  reviewed.  Constitutional:      Appearance: Normal appearance. She is well-developed and well-groomed. She is obese.  HENT:     Head: Normocephalic and atraumatic.  Eyes:     Conjunctiva/sclera: Conjunctivae normal.     Pupils: Pupils are equal, round, and reactive to light.  Cardiovascular:     Rate and Rhythm: Normal rate and regular rhythm.     Heart sounds: Normal heart sounds.  No murmur heard.   Pulmonary:     Effort: Pulmonary effort is normal.     Breath sounds: Normal breath sounds.  Chest:    Abdominal:     General: Abdomen is flat. Bowel sounds are normal.  Skin:    General: Skin is warm and dry.  Neurological:     General: No focal deficit present.     Mental Status: She is alert and oriented to person, place, and time. Mental status is at baseline.     Gait: Gait normal.  Psychiatric:        Attention and Perception: Attention and perception normal.        Mood and Affect: Mood and affect normal.        Speech: Speech normal.        Behavior: Behavior normal. Behavior is cooperative.        Thought Content: Thought content normal.        Cognition and Memory: Cognition and memory normal.        Judgment: Judgment normal.     Assessment  Plan  Hypertension, unspecified type - Plan: Comprehensive metabolic panel, Lipid panel, CBC with Differential/Platelet Cont dilt 120 m qd, lasix 20 mg prn  BP controlled   Dysuria - Plan: Urinalysis, Routine w reflex microscopic, Urine Culture  Mild intermittent asthma, unspecified whether complicated - Plan: montelukast (SINGULAIR) 10 MG tablet  Anxiety - Plan: ALPRAZolam (XANAX) 0.25 MG tablet  Intertrigo  Clotrimazole, hc otc and gold bond talc free powder   HM Flu shotwill wait  [place]ap [place]  pna had 2019 [place]  -consider prevnar and pna 23  Consider shingrix in future  Pfizer covid 2/2 booster 06/2020  04/02/20 mammo normal  [place]  [place]oscopy 10/15/06  mod to severe diverticulosis I[facility] f/u in 5 years  EGD 11/29/07 candida, mild gastritis  EGD 01/03/13/colonoscopy diverticulosis ext and int hemorrhoids TI with ulceration neg H pylori UGD 02/01/18 gastritis? If had colonoscopy GI established [facility]   Pap 10/2019 normal neg neg hpv  DEXAage 64 osteopenia calcium + vit D repeat 3-5 years had 04/02/20  Provider: Dr. French Ana [name]

## 2020-05-10 LAB — URINALYSIS, ROUTINE W REFLEX MICROSCOPIC
Bilirubin Urine: NEGATIVE
Glucose, UA: NEGATIVE
Hgb urine dipstick: NEGATIVE
Ketones, ur: NEGATIVE
Leukocytes,Ua: NEGATIVE
Nitrite: NEGATIVE
Protein, ur: NEGATIVE
Specific Gravity, Urine: 1.011 (ref 1.001–1.03)
pH: >=8.5 — AB (ref 5.0–8.0)

## 2020-05-10 LAB — URINE CULTURE
MICRO NUMBER:: 11101432
SPECIMEN QUALITY:: ADEQUATE

## 2020-05-31 ENCOUNTER — Other Ambulatory Visit: Payer: Self-pay

## 2020-05-31 ENCOUNTER — Telehealth (INDEPENDENT_AMBULATORY_CARE_PROVIDER_SITE_OTHER): Payer: No Typology Code available for payment source | Admitting: Nurse Practitioner

## 2020-05-31 ENCOUNTER — Encounter: Payer: Self-pay | Admitting: Nurse Practitioner

## 2020-05-31 VITALS — Temp 98.0°F | Ht 64.0 in | Wt 230.0 lb

## 2020-05-31 DIAGNOSIS — Z20822 Contact with and (suspected) exposure to covid-19: Secondary | ICD-10-CM | POA: Diagnosis not present

## 2020-05-31 DIAGNOSIS — J019 Acute sinusitis, unspecified: Secondary | ICD-10-CM | POA: Insufficient documentation

## 2020-05-31 DIAGNOSIS — J069 Acute upper respiratory infection, unspecified: Secondary | ICD-10-CM | POA: Diagnosis not present

## 2020-05-31 MED ORDER — SACCHAROMYCES BOULARDII 250 MG PO CAPS: 250.0000 mg | ORAL_CAPSULE | Freq: Two times a day (BID) | ORAL | 0 refills | Status: AC

## 2020-05-31 MED ORDER — GUAIFENESIN ER 600 MG PO TB12: 1200.0000 mg | ORAL_TABLET | Freq: Two times a day (BID) | ORAL | 0 refills | Status: AC | PRN

## 2020-05-31 MED ORDER — AMOXICILLIN-POT CLAVULANATE 875-125 MG PO TABS: 1.0000 | ORAL_TABLET | Freq: Two times a day (BID) | ORAL | 0 refills | Status: AC

## 2020-05-31 NOTE — Progress Notes (Addendum)
Virtual Visit via telephone Note  This visit type was conducted due to national recommendations for restrictions regarding the COVID-19 pandemic (e.g. social distancing).  This format is felt to be most appropriate for this patient at this time.  All issues noted in this document were discussed and addressed.  No physical exam was performed (except for noted visual exam findings with Video Visits).   I connected with@ on 05/31/20 at  4:00 PM EST by a video enabled telemedicine application or telephone and verified that I am speaking with the correct person using two identifiers. Location patient: home Location provider: work or home office Persons participating in the virtual visit: patient, provider  I discussed the limitations, risks, security and privacy concerns of performing an evaluation and management service by telephone and the availability of in person appointments. I also discussed with the patient that there may be a patient responsible charge related to this service. The patient expressed understanding and agreed to proceed.  Interactive audio and video telecommunications were attempted between this provider and patient, however failed, due to patient did not have access to video capability.  We continued and completed visit with audio only.   Reason for visit: Patient reports that she has nasal congestion, sore throat starting last week, she has been using Flonase, Mucinex Tylenol, no improvement.     HPI: This 64 year old patient with history of mild intermittent asthma, hypertension, GERD, reports she gets seasonal sinus infections.  She had onset 1 week ago of nasal congestion, intermittent headache, sore throat, and facial pressure.  She started using her Flonase, Mucinex for couple days, and still notes cough in the morning with postnasal drainage coming up.  She is coughing up a greenish-yellow phlegm.  She feels congested.  There is no chest pain or shortness of breath, tightness  or wheezing.  She does wake up coughing in the middle of the night, but that may be related to GERD.  She routinely uses Advair, and started using albuterol every 6 hours which helps.  Patient believes she has a sinus infection is requesting antibiotic.  She had Pfizer vaccination in May.  She has not had Covid testing.  She denies any ill contacts.   ROS: See pertinent positives and negatives per HPI.  Past Medical History:  Diagnosis Date  . Arthritis   . Asthma   . Diverticulitis   . GERD (gastroesophageal reflux disease)   . Hiatal hernia   . History of blood transfusion   . History of chicken pox   . History of GI bleed    2013/14  . Hyperlipidemia   . Hypertension     Past Surgical History:  Procedure Laterality Date  . BREAST BIOPSY    . BREAST SURGERY     bx dense breast nl per pt   . CESAREAN SECTION     04/27/84    Family History  Problem Relation Age of Onset  . Hypertension Maternal Aunt   . Diabetes Maternal Aunt   . Breast cancer Neg Hx     SOCIAL HX: None tobacco user.   Current Outpatient Medications:  .  albuterol (VENTOLIN HFA) 108 (90 Base) MCG/ACT inhaler, Inhale 1-2 puffs into the lungs every 6 (six) hours as needed., Disp: 18 g, Rfl: 11 .  ALPRAZolam (XANAX) 0.25 MG tablet, Take 0.5-1 tablets (0.125-0.25 mg total) by mouth at bedtime as needed for anxiety., Disp: 30 tablet, Rfl: 2 .  atorvastatin (LIPITOR) 10 MG tablet, Take 1 tablet (10 mg  total) by mouth daily at 6 PM., Disp: 90 tablet, Rfl: 3 .  CALCIUM PO, Take by mouth., Disp: , Rfl:  .  diltiazem (CARDIZEM CD) 120 MG 24 hr capsule, Take 1 capsule (120 mg total) by mouth daily., Disp: 90 capsule, Rfl: 3 .  fluticasone (FLONASE) 50 MCG/ACT nasal spray, Place into both nostrils daily., Disp: , Rfl:  .  Fluticasone-Salmeterol (ADVAIR DISKUS) 100-50 MCG/DOSE AEPB, Inhale 1 puff into the lungs in the morning and at bedtime. RINSE MOUTH.NOT GENERIC, Disp: 60 each, Rfl: 12 .  furosemide (LASIX) 20 MG  tablet, Take 0.5 tablets (10 mg total) by mouth daily as needed., Disp: 30 tablet, Rfl: 5 .  montelukast (SINGULAIR) 10 MG tablet, Take 1 tablet (10 mg total) by mouth at bedtime., Disp: 90 tablet, Rfl: 3 .  omeprazole (PRILOSEC) 40 MG capsule, TAKE 1 CAPSULE BY MOUTH TWICE A DAY, Disp: 180 capsule, Rfl: 0 .  Specialty Vitamins Products (WOMENS VITA PAK PO), Take by mouth daily., Disp: , Rfl:  .  VITAMIN D PO, Take by mouth., Disp: , Rfl:  .  amoxicillin-clavulanate (AUGMENTIN) 875-125 MG tablet, Take 1 tablet by mouth 2 (two) times daily for 7 days., Disp: 14 tablet, Rfl: 0 .  guaiFENesin (MUCINEX) 600 MG 12 hr tablet, Take 2 tablets (1,200 mg total) by mouth 2 (two) times daily as needed for up to 7 days for cough or to loosen phlegm., Disp: 28 tablet, Rfl: 0 .  saccharomyces boulardii (FLORASTOR) 250 MG capsule, Take 1 capsule (250 mg total) by mouth 2 (two) times daily for 14 days., Disp: 28 capsule, Rfl: 0 .  sodium chloride (OCEAN) 0.65 % SOLN nasal spray, Place 1 spray into both nostrils as needed for congestion. (Patient not taking: Reported on 05/31/2020), Disp: 30 mL, Rfl: 11  EXAM:  VITALS per patient if applicable: Weight is 230 pounds.  Temp 98.0  GENERAL: Sounds alert, oriented, and in no acute distress, mild nasal congested tone to voice.   LUNGS: No sounds of respiratory distress, breathing rate appears normal, no obvious gross SOB, gasping, or wheezing.    PSYCH/NEURO: pleasant and cooperative, no obvious depression or anxiety, speech and thought processing grossly intact  ASSESSMENT AND PLAN:  Discussed the following assessment and plan:  URI with cough and congestion  Suspected COVID-19 virus infection  Acute non-recurrent sinusitis, unspecified location  Patient advised to get a Covid test.  She can contact her local pharmacy for drive-through Covid test.    Advised to rest , drink plenty of fluids, and use tylenol or ibuprofen as needed for pain  Please use  normal saline nasal spray  such as simply saline as directed to help rinse, decongest the nasal passages.   Use your Flonase twice daily- nasal  spray for local steroid application to decrease swelling and inflammation.  If no improvement in your symptoms 3 prescriptions were called in:   1.  Augmentin 875 mg 1 twice daily for 7 days for sinus infection.  2. Mucinex 1200 mg twice daily for 7 days as needed to thin secretions.  Drink plenty of fluid.  3.  A probiotic called Florastor to take twice a day to help restore normal flora in the colon so you did not develop diarrhea from the Augmentin .  You may also eat yogurt such as Activia that has active cultures.  If you do need to cough medicine, you may pick up over-the-counter Robitussin and use as directed.  Patient advised to quarantine until  she finds out that  her Covid test is negative. Only leave home to seek medical care and must wear a mask in public. Limit contact with family members or caregivers in the home. Practice social distancing and to continue to use good preventative care measures such has frequent hand washing.   Contact a health care provider if:  You have a fever.  Your symptoms get worse.  Your symptoms do not improve within 3 days. Get help right away if:  You have a severe headache.  You have persistent vomiting.  You have severe pain or swelling around your face or eyes.  You have vision problems.  You develop confusion.  Your neck is stiff.  You have trouble breathing.  I discussed the assessment and treatment plan with the patient. The patient was provided an opportunity to ask questions and all were answered. The patient agreed with the plan and demonstrated an understanding of the instructions.   The patient was advised to call back or seek an in-person evaluation if the symptoms worsen or if the condition fails to improve as anticipated.  I provided 18 minutes of non-face-to-face time during  this encounter. Amedeo Kinsman, NP Adult Nurse Practitioner [facility] [employer] [phone]

## 2020-05-31 NOTE — Patient Instructions (Addendum)
Patient advised to get a Covid test.  She can contact her local pharmacy for drive-through Covid test.    Advised to rest , drink plenty of fluids, and use tylenol or ibuprofen as needed for pain  Please use normal saline nasal spray  such as simply saline as directed to help rinse, decongest the nasal passages.   Use your Flonase twice daily- nasal  spray for local steroid application to decrease swelling and inflammation.  If no improvement in your symptoms 3 prescriptions were called in:   1.  Augmentin 875 mg 1 twice daily for 7 days for sinus infection.  2. Mucinex 1200 mg twice daily for 7 days as needed to thin secretions.  Drink plenty of fluid.  3.  A probiotic called Florastor to take twice a day to help restore normal flora in the colon so you did not develop diarrhea from the Augmentin .  You may also eat yogurt such as Activia that has active cultures.  If you do need to cough medicine, you may pick up over-the-counter Robitussin and use as directed.  Patient advised to quarantine until she finds out that  her Covid test is negative. Only leave home to seek medical care and must wear a mask in public. Limit contact with family members or caregivers in the home. Practice social distancing and to continue to use good preventative care measures such has frequent hand washing.   Contact a health care provider if:  You have a fever.  Your symptoms get worse.  Your symptoms do not improve within 3 days. Get help right away if:  You have a severe headache.  You have persistent vomiting.  You have severe pain or swelling around your face or eyes.  You have vision problems.  You develop confusion.  Your neck is stiff.  You have trouble breathing.  Upper Respiratory Infection, Adult An upper respiratory infection (URI) is a common viral infection of the nose, throat, and upper air passages that lead to the lungs. The most common type of URI is the common cold. URIs  usually get better on their own, without medical treatment. What are the causes? A URI is caused by a virus. You may catch a virus by:  Breathing in droplets from an infected person's cough or sneeze.  Touching something that has been exposed to the virus (contaminated) and then touching your mouth, nose, or eyes. What increases the risk? You are more likely to get a URI if:  You are very young or very old.  It is autumn or winter.  You have close contact with others, such as at a daycare, school, or health care facility.  You smoke.  You have long-term (chronic) heart or lung disease.  You have a weakened disease-fighting (immune) system.  You have nasal allergies or asthma.  You are experiencing a lot of stress.  You work in an area that has poor air circulation.  You have poor nutrition. What are the signs or symptoms? A URI usually involves some of the following symptoms:  Runny or stuffy (congested) nose.  Sneezing.  Cough.  Sore throat.  Headache.  Fatigue.  Fever.  Loss of appetite.  Pain in your forehead, behind your eyes, and over your cheekbones (sinus pain).  Muscle aches.  Redness or irritation of the eyes.  Pressure in the ears or face. How is this diagnosed? This condition may be diagnosed based on your medical history and symptoms, and a physical exam. Your health  care provider may use a cotton swab to take a mucus sample from your nose (nasal swab). This sample can be tested to determine what virus is causing the illness. How is this treated? URIs usually get better on their own within 7-10 days. You can take steps at home to relieve your symptoms. Medicines cannot cure URIs, but your health care provider may recommend certain medicines to help relieve symptoms, such as:  Over-the-counter cold medicines.  Cough suppressants. Coughing is a type of defense against infection that helps to clear the respiratory system, so take these medicines  only as recommended by your health care provider.  Fever-reducing medicines. Follow these instructions at home: Activity  Rest as needed.  If you have a fever, stay home from work or school until your fever is gone or until your health care provider says you are no longer contagious. Your health care provider may have you wear a face mask to prevent your infection from spreading. Relieving symptoms  Gargle with a salt-water mixture 3-4 times a day or as needed. To make a salt-water mixture, completely dissolve -1 tsp of salt in 1 cup of warm water.  Use a cool-mist humidifier to add moisture to the air. This can help you breathe more easily. Eating and drinking   Drink enough fluid to keep your urine pale yellow.  Eat soups and other clear broths. General instructions   Take over-the-counter and prescription medicines only as told by your health care provider. These include cold medicines, fever reducers, and cough suppressants.  Do not use any products that contain nicotine or tobacco, such as cigarettes and e-cigarettes. If you need help quitting, ask your health care provider.  Stay away from secondhand smoke.  Stay up to date on all immunizations, including the yearly (annual) flu vaccine.  Keep all follow-up visits as told by your health care provider. This is important. How to prevent the spread of infection to others   URIs can be passed from person to person (are contagious). To prevent the infection from spreading: ? Wash your hands often with soap and water. If soap and water are not available, use hand sanitizer. ? Avoid touching your mouth, face, eyes, or nose. ? Cough or sneeze into a tissue or your sleeve or elbow instead of into your hand or into the air. Contact a health care provider if:  You are getting worse instead of better.  You have a fever or chills.  Your mucus is brown or red.  You have yellow or brown discharge coming from your nose.  You  have pain in your face, especially when you bend forward.  You have swollen neck glands.  You have pain while swallowing.  You have white areas in the back of your throat. Get help right away if:  You have shortness of breath that gets worse.  You have severe or persistent: ? Headache. ? Ear pain. ? Sinus pain. ? Chest pain.  You have chronic lung disease along with any of the following: ? Wheezing. ? Prolonged cough. ? Coughing up blood. ? A change in your usual mucus.  You have a stiff neck.  You have changes in your: ? Vision. ? Hearing. ? Thinking. ? Mood. Summary  An upper respiratory infection (URI) is a common infection of the nose, throat, and upper air passages that lead to the lungs.  A URI is caused by a virus.  URIs usually get better on their own within 7-10 days.  Medicines  cannot cure URIs, but your health care provider may recommend certain medicines to help relieve symptoms. This information is not intended to replace advice given to you by your health care provider. Make sure you discuss any questions you have with your health care provider. Document Revised: 07/14/2018 Document Reviewed: 02/19/2017 Elsevier Patient Education  2020 [employer]  Sinusitis, Adult Sinusitis is inflammation of your sinuses. Sinuses are hollow spaces in the bones around your face. Your sinuses are located:  Around your eyes.  In the middle of your forehead.  Behind your nose.  In your cheekbones. Mucus normally drains out of your sinuses. When your nasal tissues become inflamed or swollen, mucus can become trapped or blocked. This allows bacteria, viruses, and fungi to grow, which leads to infection. Most infections of the sinuses are caused by a virus. Sinusitis can develop quickly. It can last for up to 4 weeks (acute) or for more than 12 weeks (chronic). Sinusitis often develops after a cold. What are the causes? This condition is caused by anything that  creates swelling in the sinuses or stops mucus from draining. This includes:  Allergies.  Asthma.  Infection from bacteria or viruses.  Deformities or blockages in your nose or sinuses.  Abnormal growths in the nose (nasal polyps).  Pollutants, such as chemicals or irritants in the air.  Infection from fungi (rare). What increases the risk? You are more likely to develop this condition if you:  Have a weak body defense system (immune system).  Do a lot of swimming or diving.  Overuse nasal sprays.  Smoke. What are the signs or symptoms? The main symptoms of this condition are pain and a feeling of pressure around the affected sinuses. Other symptoms include:  Stuffy nose or congestion.  Thick drainage from your nose.  Swelling and warmth over the affected sinuses.  Headache.  Upper toothache.  A cough that may get worse at night.  Extra mucus that collects in the throat or the back of the nose (postnasal drip).  Decreased sense of smell and taste.  Fatigue.  A fever.  Sore throat.  Bad breath. How is this diagnosed? This condition is diagnosed based on:  Your symptoms.  Your medical history.  A physical exam.  Tests to find out if your condition is acute or chronic. This may include: ? Checking your nose for nasal polyps. ? Viewing your sinuses using a device that has a light (endoscope). ? Testing for allergies or bacteria. ? Imaging tests, such as an MRI or CT scan. In rare cases, a bone biopsy may be done to rule out more serious types of fungal sinus disease. How is this treated? Treatment for sinusitis depends on the cause and whether your condition is chronic or acute.  If caused by a virus, your symptoms should go away on their own within 10 days. You may be given medicines to relieve symptoms. They include: ? Medicines that shrink swollen nasal passages (topical intranasal decongestants). ? Medicines that treat allergies  (antihistamines). ? A spray that eases inflammation of the nostrils (topical intranasal corticosteroids). ? Rinses that help get rid of thick mucus in your nose (nasal saline washes).  If caused by bacteria, your health care provider may recommend waiting to see if your symptoms improve. Most bacterial infections will get better without antibiotic medicine. You may be given antibiotics if you have: ? A severe infection. ? A weak immune system.  If caused by narrow nasal passages or nasal polyps, you  may need to have surgery. Follow these instructions at home: Medicines  Take, use, or apply over-the-counter and prescription medicines only as told by your health care provider. These may include nasal sprays.  If you were prescribed an antibiotic medicine, take it as told by your health care provider. Do not stop taking the antibiotic even if you start to feel better. Hydrate and humidify   Drink enough fluid to keep your urine pale yellow. Staying hydrated will help to thin your mucus.  Use a cool mist humidifier to keep the humidity level in your home above 50%.  Inhale steam for 10-15 minutes, 3-4 times a day, or as told by your health care provider. You can do this in the bathroom while a hot shower is running.  Limit your exposure to cool or dry air. Rest  Rest as much as possible.  Sleep with your head raised (elevated).  Make sure you get enough sleep each night. General instructions   Apply a warm, moist washcloth to your face 3-4 times a day or as told by your health care provider. This will help with discomfort.  Wash your hands often with soap and water to reduce your exposure to germs. If soap and water are not available, use hand sanitizer.  Do not smoke. Avoid being around people who are smoking (secondhand smoke).  Keep all follow-up visits as told by your health care provider. This is important. Contact a health care provider if:  You have a fever.  Your  symptoms get worse.  Your symptoms do not improve within 10 days. Get help right away if:  You have a severe headache.  You have persistent vomiting.  You have severe pain or swelling around your face or eyes.  You have vision problems.  You develop confusion.  Your neck is stiff.  You have trouble breathing. Summary  Sinusitis is soreness and inflammation of your sinuses. Sinuses are hollow spaces in the bones around your face.  This condition is caused by nasal tissues that become inflamed or swollen. The swelling traps or blocks the flow of mucus. This allows bacteria, viruses, and fungi to grow, which leads to infection.  If you were prescribed an antibiotic medicine, take it as told by your health care provider. Do not stop taking the antibiotic even if you start to feel better.  Keep all follow-up visits as told by your health care provider. This is important. This information is not intended to replace advice given to you by your health care provider. Make sure you discuss any questions you have with your health care provider. Document Revised: 12/06/2017 Document Reviewed: 12/06/2017 Elsevier Patient Education  2020 [employer].

## 2020-06-28 ENCOUNTER — Telehealth: Payer: Self-pay | Admitting: Internal Medicine

## 2020-06-28 NOTE — Telephone Encounter (Signed)
Faxed to [facility] for prescriber response form for continue current therapy faxed on 06-28-20

## 2020-07-02 ENCOUNTER — Telehealth: Payer: Self-pay | Admitting: Gastroenterology

## 2020-07-02 NOTE — Telephone Encounter (Signed)
Pt called to resch procedure, dows she need another office visit?  Please call to schedule

## 2020-07-02 NOTE — Telephone Encounter (Signed)
Yes patient needs appointment she has not been seen since February 2021. She canceled her procedure because she states she was feeling better and did not need procedure. Patient states now she is having acid reflex and bloating. Scheduled patient appointment for 08/20/2020.

## 2020-07-04 ENCOUNTER — Encounter: Payer: Self-pay | Admitting: Intensive Care

## 2020-07-04 ENCOUNTER — Emergency Department
Admission: EM | Admit: 2020-07-04 | Discharge: 2020-07-05 | Disposition: A | Discharge status: Home / Self Care | Payer: No Typology Code available for payment source | Attending: Emergency Medicine | Admitting: Emergency Medicine

## 2020-07-04 ENCOUNTER — Telehealth: Payer: Self-pay

## 2020-07-04 ENCOUNTER — Other Ambulatory Visit: Payer: Self-pay

## 2020-07-04 DIAGNOSIS — I10 Essential (primary) hypertension: Secondary | ICD-10-CM | POA: Insufficient documentation

## 2020-07-04 DIAGNOSIS — Z79899 Other long term (current) drug therapy: Secondary | ICD-10-CM | POA: Diagnosis not present

## 2020-07-04 DIAGNOSIS — Z7951 Long term (current) use of inhaled steroids: Secondary | ICD-10-CM | POA: Insufficient documentation

## 2020-07-04 DIAGNOSIS — J4520 Mild intermittent asthma, uncomplicated: Secondary | ICD-10-CM | POA: Insufficient documentation

## 2020-07-04 DIAGNOSIS — R55 Syncope and collapse: Secondary | ICD-10-CM | POA: Insufficient documentation

## 2020-07-04 LAB — BASIC METABOLIC PANEL WITH GFR
Anion gap: 11 (ref 5–15)
BUN: 13 mg/dL (ref 8–23)
CO2: 25 mmol/L (ref 22–32)
Calcium: 9.4 mg/dL (ref 8.9–10.3)
Chloride: 104 mmol/L (ref 98–111)
Creatinine, Ser: 1.08 mg/dL — ABNORMAL HIGH (ref 0.44–1.00)
GFR, Estimated: 57 mL/min — ABNORMAL LOW
Glucose, Bld: 101 mg/dL — ABNORMAL HIGH (ref 70–99)
Potassium: 3.7 mmol/L (ref 3.5–5.1)
Sodium: 140 mmol/L (ref 135–145)

## 2020-07-04 LAB — URINALYSIS, COMPLETE (UACMP) WITH MICROSCOPIC
Bacteria, UA: NONE SEEN
Bilirubin Urine: NEGATIVE
Glucose, UA: NEGATIVE mg/dL
Hgb urine dipstick: NEGATIVE
Ketones, ur: NEGATIVE mg/dL
Leukocytes,Ua: NEGATIVE
Nitrite: NEGATIVE
Protein, ur: NEGATIVE mg/dL
Specific Gravity, Urine: 1.008 (ref 1.005–1.030)
pH: 5 (ref 5.0–8.0)

## 2020-07-04 LAB — CBC
HCT: 37.5 % (ref 36.0–46.0)
Hemoglobin: 13.3 g/dL (ref 12.0–15.0)
MCH: 27.2 pg (ref 26.0–34.0)
MCHC: 35.5 g/dL (ref 30.0–36.0)
MCV: 76.7 fL — ABNORMAL LOW (ref 80.0–100.0)
Platelets: 265 K/uL (ref 150–400)
RBC: 4.89 MIL/uL (ref 3.87–5.11)
RDW: 15.8 % — ABNORMAL HIGH (ref 11.5–15.5)
WBC: 11.6 K/uL — ABNORMAL HIGH (ref 4.0–10.5)
nRBC: 0 % (ref 0.0–0.2)

## 2020-07-04 MED ORDER — LACTATED RINGERS IV BOLUS: 1000.0000 mL | Freq: Once | INTRAVENOUS | Status: DC

## 2020-07-04 NOTE — Telephone Encounter (Signed)
Please advise 

## 2020-07-04 NOTE — ED Provider Notes (Signed)
[facility] Provider Note   ____________________________________________   Event Date/Time   First MD Initiated Contact with Patient 07/04/20 2314     (approximate)  I have reviewed the triage vital signs and the nursing notes.   HISTORY  Chief Complaint Loss of Consciousness    HPI Cheryl Coleman is a 64 y.o. female with past medical history of asthma, hypertension, hyperlipidemia, and GERD who presents to the ED complaining of syncope.  Patient reports that 2 nights ago she was having dinner when she suddenly felt a fullness in her upper abdomen.  She started feeling hot, clammy, and lightheaded.  She eventually lost consciousness for a few seconds, but did not fall or hit her head and simply leaned forward on the kitchen table.  She did not have any chest pain or shortness of breath with this episode.  She states she has been feeling well since then, but spoke with her PCP today, who recommended she be evaluated in the ED.  She denies any cardiac history or history of similar episodes in the past.        Past Medical History:  Diagnosis Date  . Arthritis   . Asthma   . Diverticulitis   . GERD (gastroesophageal reflux disease)   . Hiatal hernia   . History of blood transfusion   . History of chicken pox   . History of GI bleed    2013/14  . Hyperlipidemia   . Hypertension     Patient Active Problem List   Diagnosis Date Noted  . URI with cough and congestion 05/31/2020  . Suspected COVID-19 virus infection 05/31/2020  . Acute non-recurrent sinusitis 05/31/2020  . Osteopenia 05/09/2020  . Annual physical exam 11/03/2019  . Onychomycosis 11/03/2019  . Asthma 08/29/2019  . Tachycardia 08/29/2019  . Hiatal hernia 06/06/2019  . Anxiety 11/25/2018  . Palpitations 10/19/2018  . Vitamin D deficiency 10/06/2018  . Prediabetes 10/06/2018  . Essential hypertension 08/18/2018  . Leg edema 08/18/2018  . Gastroesophageal reflux  disease 08/18/2018  . Hyperlipidemia 08/18/2018  . Mild intermittent asthma 08/18/2018    Past Surgical History:  Procedure Laterality Date  . BREAST BIOPSY    . BREAST SURGERY     bx dense breast nl per pt   . CESAREAN SECTION     04/27/84    Prior to Admission medications   Medication Sig Start Date End Date Taking? Authorizing Provider  albuterol (VENTOLIN HFA) 108 (90 Base) MCG/ACT inhaler Inhale 1-2 puffs into the lungs every 6 (six) hours as needed. 10/04/19   [name], [name], MD  ALPRAZolam Prudy Feeler) 0.25 MG tablet Take 0.5-1 tablets (0.125-0.25 mg total) by mouth at bedtime as needed for anxiety. 05/09/20   [name], [name], MD  atorvastatin (LIPITOR) 10 MG tablet Take 1 tablet (10 mg total) by mouth daily at 6 PM. 03/08/20   [name], [name], MD  CALCIUM PO Take by mouth.    [provider]  diltiazem (CARDIZEM CD) 120 MG 24 hr capsule Take 1 capsule (120 mg total) by mouth daily. 04/24/20   [name], [name], MD  fluticasone (FLONASE) 50 MCG/ACT nasal spray Place into both nostrils daily.    [provider]  Fluticasone-Salmeterol (ADVAIR DISKUS) 100-50 MCG/DOSE AEPB Inhale 1 puff into the lungs in the morning and at bedtime. RINSE MOUTH.NOT GENERIC 11/03/19   [name], [name], MD  furosemide (LASIX) 20 MG tablet Take 0.5 tablets (10 mg total) by mouth daily as  needed. 05/09/20   [name], [name], MD  montelukast (SINGULAIR) 10 MG tablet Take 1 tablet (10 mg total) by mouth at bedtime. 05/09/20   [name], [name], MD  omeprazole (PRILOSEC) 40 MG capsule TAKE 1 CAPSULE BY MOUTH TWICE A DAY 04/02/20   Vanga, Loel Dubonnet, MD  sodium chloride (OCEAN) 0.65 % SOLN nasal spray Place 1 spray into both nostrils as needed for congestion. Patient not taking: Reported on 05/31/2020 11/03/19   [name], [name], MD  Specialty Vitamins Products (WOMENS VITA PAK PO) Take by mouth daily.    [provider]   VITAMIN D PO Take by mouth.    [provider]    Allergies Oxycodone  Family History  Problem Relation Age of Onset  . Hypertension Maternal Aunt   . Diabetes Maternal Aunt   . Breast cancer Neg Hx     Social History Social History   Tobacco Use  . Smoking status: Never Smoker  . Smokeless tobacco: Never Used  Vaping Use  . Vaping Use: Never used  Substance Use Topics  . Alcohol use: Not Currently  . Drug use: Not Currently    Review of Systems  Constitutional: No fever/chills Eyes: No visual changes. ENT: No sore throat. Cardiovascular: Denies chest pain.  Positive for lightheadedness and syncope. Respiratory: Denies shortness of breath. Gastrointestinal: No abdominal pain.  No nausea, no vomiting.  No diarrhea.  No constipation. Genitourinary: Negative for dysuria. Musculoskeletal: Negative for back pain. Skin: Negative for rash. Neurological: Negative for headaches, focal weakness or numbness.  ____________________________________________   PHYSICAL EXAM:  VITAL SIGNS: ED Triage Vitals  Enc Vitals Group     BP 07/04/20 1740 (!) 148/93     Pulse Rate 07/04/20 1740 88     Resp 07/04/20 1740 16     Temp 07/04/20 1740 98.6 F (37 C)     Temp Source 07/04/20 1740 Oral     SpO2 07/04/20 1740 99 %     Weight 07/04/20 1741 230 lb (104.3 kg)     Height 07/04/20 1741 5\' 4"  (1.626 m)     Head Circumference --      Peak Flow --      Pain Score 07/04/20 1740 0     Pain Loc --      Pain Edu? --      Excl. in GC? --     Constitutional: Alert and oriented. Eyes: Conjunctivae are normal. Head: Atraumatic. Nose: No congestion/rhinnorhea. Mouth/Throat: Mucous membranes are moist. Neck: Normal ROM Cardiovascular: Normal rate, regular rhythm. Grossly normal heart sounds.  2+ radial pulses bilaterally. Respiratory: Normal respiratory effort.  No retractions. Lungs CTAB. Gastrointestinal: Soft and nontender. No distention. Genitourinary:  deferred Musculoskeletal: No lower extremity tenderness nor edema. Neurologic:  Normal speech and language. No gross focal neurologic deficits are appreciated. Skin:  Skin is warm, dry and intact. No rash noted. Psychiatric: Mood and affect are normal. Speech and behavior are normal.  ____________________________________________   LABS (all labs ordered are listed, but only abnormal results are displayed)  Labs Reviewed  BASIC METABOLIC PANEL - Abnormal; Notable for the following components:      Result Value   Glucose, Bld 101 (*)    Creatinine, Ser 1.08 (*)    GFR, Estimated 57 (*)    All other components within normal limits  CBC - Abnormal; Notable for the following components:   WBC 11.6 (*)    MCV 76.7 (*)    RDW 15.8 (*)  All other components within normal limits  URINALYSIS, COMPLETE (UACMP) WITH MICROSCOPIC - Abnormal; Notable for the following components:   Color, Urine STRAW (*)    APPearance CLEAR (*)    All other components within normal limits  TROPONIN I (HIGH SENSITIVITY)  TROPONIN I (HIGH SENSITIVITY)   ____________________________________________  EKG  ED ECG REPORT I, Chesley Noon, the attending physician, personally viewed and interpreted this ECG.   Date: 07/04/2020  EKG Time: 17:42  Rate: 88  Rhythm: normal sinus rhythm  Axis: Normal  Intervals:none  ST&T Change: None   PROCEDURES  Procedure(s) performed (including Critical Care):  Procedures   ____________________________________________   INITIAL IMPRESSION / ASSESSMENT AND PLAN / ED COURSE       64 year old female with past medical history of asthma, hypertension, hyperlipidemia, and GERD who presents to the ED complaining of syncopal episode 2 days ago where she suddenly felt lightheaded and hot while eating dinner.  Episode sounds most consistent with a vasovagal episode, EKG shows no evidence of arrhythmia or ischemia and review of cardiac monitor shows normal sinus rhythm.   Lab work is unremarkable thus far, we will add on troponin.  UA without evidence of infection.  If troponin within normal limits, patient will be appropriate for discharge home with PCP follow-up.  Troponin within normal limits and patient remains asymptomatic here in the ED.  She is appropriate for discharge home with PCP follow-up, was counseled to return to the ED for new worsening symptoms.  Patient agrees with plan.      ____________________________________________   FINAL CLINICAL IMPRESSION(S) / ED DIAGNOSES  Final diagnoses:  Syncope and collapse     ED Discharge Orders    None       Note:  This document was prepared using Dragon voice recognition software and may include unintentional dictation errors.   Chesley Noon, MD 07/05/20 [phone]

## 2020-07-04 NOTE — ED Notes (Signed)
Patient provided with water and ginger ale per request.

## 2020-07-04 NOTE — Telephone Encounter (Signed)
Pt states that two nights ago while she was eating dinner (broiled fish) got stuck and she blacked out for about a minute. She came to and her husband took her blood pressure 158/92. After she calmed down within the hour, it went down to 130/65. She did not want to go to ED although she felt as though she should. Pt took it easy yesterday and ate soup. She does not state of any other symptoms. She states that she has a GI appt in February. She will call them and let them know about it as well. Please advise.

## 2020-07-04 NOTE — Telephone Encounter (Signed)
I rec [facility] and call for sooner GI appt if bone is still stuck

## 2020-07-04 NOTE — ED Triage Notes (Signed)
Patient reports while sitting at her table eating dinner Tuesday night 05/02/20 she had a episode of LOC. Her PCP sent her here after speaking with her about episode. Denies pain. Denies LOC since Tuesday. Denies hitting head

## 2020-07-04 NOTE — Telephone Encounter (Signed)
Called the Patient and she states that she was eating broiled fish and mashed potatoes. She was not chocking or having trouble swallowing. States it felt like the food stopped in her stomach and stomach was in knots. States she felt all the blood rush to her head and she loss consciousness and slumped over the table. States she then felt shaky, dizzy, and her stomach in knots.   Informed the Patient that since GI will not see her until February and she passed out she will need to be evaluated. Informed the Patient that Dr French Ana [name] recommends [facility] since it is attached to the ED should she need to go there or have imaging.  Patient verbalized understanding

## 2020-07-04 NOTE — ED Notes (Signed)
Lab called to add on Troponin. Per lab staff, will be put into process at this time.   Patient declines IV start or IVF. Requesting to do oral hydration. MD made aware and provides verbal approval for oral hydration vs IVF.

## 2020-07-05 ENCOUNTER — Other Ambulatory Visit: Payer: Self-pay

## 2020-07-05 DIAGNOSIS — J4520 Mild intermittent asthma, uncomplicated: Secondary | ICD-10-CM

## 2020-07-05 LAB — TROPONIN I (HIGH SENSITIVITY): Troponin I (High Sensitivity): 6 ng/L

## 2020-07-05 MED ORDER — MONTELUKAST SODIUM 10 MG PO TABS: 10.0000 mg | ORAL_TABLET | Freq: Every day | ORAL | 3 refills | Status: DC

## 2020-07-23 ENCOUNTER — Other Ambulatory Visit: Payer: Self-pay | Admitting: Internal Medicine

## 2020-07-23 ENCOUNTER — Other Ambulatory Visit: Payer: No Typology Code available for payment source

## 2020-07-23 ENCOUNTER — Encounter: Payer: Self-pay | Admitting: Internal Medicine

## 2020-07-23 ENCOUNTER — Telehealth (INDEPENDENT_AMBULATORY_CARE_PROVIDER_SITE_OTHER): Payer: No Typology Code available for payment source | Admitting: Internal Medicine

## 2020-07-23 ENCOUNTER — Other Ambulatory Visit: Payer: Self-pay

## 2020-07-23 VITALS — Ht 64.0 in | Wt 220.0 lb

## 2020-07-23 DIAGNOSIS — R55 Syncope and collapse: Secondary | ICD-10-CM | POA: Insufficient documentation

## 2020-07-23 DIAGNOSIS — R002 Palpitations: Secondary | ICD-10-CM | POA: Diagnosis not present

## 2020-07-23 DIAGNOSIS — J069 Acute upper respiratory infection, unspecified: Secondary | ICD-10-CM

## 2020-07-23 DIAGNOSIS — K8020 Calculus of gallbladder without cholecystitis without obstruction: Secondary | ICD-10-CM

## 2020-07-23 DIAGNOSIS — H04203 Unspecified epiphora, bilateral lacrimal glands: Secondary | ICD-10-CM

## 2020-07-23 DIAGNOSIS — M791 Myalgia, unspecified site: Secondary | ICD-10-CM

## 2020-07-23 DIAGNOSIS — H9209 Otalgia, unspecified ear: Secondary | ICD-10-CM

## 2020-07-23 DIAGNOSIS — J329 Chronic sinusitis, unspecified: Secondary | ICD-10-CM | POA: Diagnosis not present

## 2020-07-23 DIAGNOSIS — R1310 Dysphagia, unspecified: Secondary | ICD-10-CM | POA: Insufficient documentation

## 2020-07-23 DIAGNOSIS — I10 Essential (primary) hypertension: Secondary | ICD-10-CM

## 2020-07-23 DIAGNOSIS — Z20822 Contact with and (suspected) exposure to covid-19: Secondary | ICD-10-CM | POA: Diagnosis not present

## 2020-07-23 MED ORDER — OLOPATADINE HCL 0.2 % OP SOLN: 1.0000 [drp] | Freq: Every day | OPHTHALMIC | 2 refills | Status: DC

## 2020-07-23 MED ORDER — AZITHROMYCIN 250 MG PO TABS: ORAL_TABLET | ORAL | 0 refills | Status: DC

## 2020-07-23 NOTE — Progress Notes (Signed)
Telephone Note  I connected with Cheryl Coleman   on 07/23/20 at  2:00 PM EST telephone and verified that I am speaking with the correct person using two identifiers.  Location patient: home,  Location provider:work or home office Persons participating in the virtual visit: patient, provider  I discussed the limitations of evaluation and management by telemedicine and the availability of in person appointments. The patient expressed understanding and agreed to proceed.   HPI: covid exposure over xmas with a family member in [place] who had 3 coworkers +covid 19, she had 1 day of diarrhea, earache, brain fog, itchy eyes, h/a and nasal congestion with sneezing, runny nose, sinus pressure, watery eyes, nasal congestion and sinus pressure improved as well as hoarseness and sore throat. She did have fever 101/102, increased blood pressure now normal sbp in 130s, fever, chills, body aches, brain fog, ear pain, diarrhea x 1 day, cough with yellow phelgm her mother in law had the sniffles  She tried [place] otc cold and  Flu using albuterol and advair inhalers denies sob, wheezing   Syncope 07/04/20 felt it coming on with h/o palpitations on dilt 120 CD qd  Requests cards referral prev saw cards in [place]  She felt like she was choking or having trouble swallowing food when this episode happened 07/04/20  Dysphagia appt with Dr. Allegra Lai 08/20/20   ROS: See pertinent positives and negatives per HPI.  Past Medical History:  Diagnosis Date  . Arthritis   . Asthma   . Diverticulitis   . GERD (gastroesophageal reflux disease)   . Hiatal hernia   . History of blood transfusion   . History of chicken pox   . History of GI bleed    2013/14  . Hyperlipidemia   . Hypertension     Past Surgical History:  Procedure Laterality Date  . BREAST BIOPSY    . BREAST SURGERY     bx dense breast nl per pt   . CESAREAN SECTION     04/27/84     Current Outpatient Medications:  .  albuterol (VENTOLIN HFA) 108  (90 Base) MCG/ACT inhaler, Inhale 1-2 puffs into the lungs every 6 (six) hours as needed., Disp: 18 g, Rfl: 11 .  Ascorbic Acid (VITAMIN C PO), Take by mouth., Disp: , Rfl:  .  atorvastatin (LIPITOR) 10 MG tablet, Take 1 tablet (10 mg total) by mouth daily at 6 PM., Disp: 90 tablet, Rfl: 3 .  azithromycin (ZITHROMAX) 250 MG tablet, 2 pills day 1 and 1 pill day 2-5 with food, Disp: 6 tablet, Rfl: 0 .  CALCIUM PO, Take by mouth., Disp: , Rfl:  .  diltiazem (CARDIZEM CD) 120 MG 24 hr capsule, Take 1 capsule (120 mg total) by mouth daily., Disp: 90 capsule, Rfl: 3 .  fluticasone (FLONASE) 50 MCG/ACT nasal spray, Place into both nostrils daily., Disp: , Rfl:  .  Fluticasone-Salmeterol (ADVAIR DISKUS) 100-50 MCG/DOSE AEPB, Inhale 1 puff into the lungs in the morning and at bedtime. RINSE MOUTH.NOT GENERIC, Disp: 60 each, Rfl: 12 .  furosemide (LASIX) 20 MG tablet, Take 0.5 tablets (10 mg total) by mouth daily as needed., Disp: 30 tablet, Rfl: 5 .  montelukast (SINGULAIR) 10 MG tablet, Take 1 tablet (10 mg total) by mouth at bedtime., Disp: 90 tablet, Rfl: 3 .  Olopatadine HCl (PATADAY) 0.2 % SOLN, Apply 1 drop to eye daily., Disp: 2.5 mL, Rfl: 2 .  omeprazole (PRILOSEC) 40 MG capsule, TAKE 1 CAPSULE BY MOUTH TWICE A  DAY, Disp: 180 capsule, Rfl: 0 .  sodium chloride (OCEAN) 0.65 % SOLN nasal spray, Place 1 spray into both nostrils as needed for congestion., Disp: 30 mL, Rfl: 11 .  Specialty Vitamins Products (WOMENS VITA PAK PO), Take by mouth daily., Disp: , Rfl:  .  VITAMIN D PO, Take by mouth., Disp: , Rfl:  .  ALPRAZolam (XANAX) 0.25 MG tablet, Take 0.5-1 tablets (0.125-0.25 mg total) by mouth at bedtime as needed for anxiety. (Patient not taking: Reported on 07/23/2020), Disp: 30 tablet, Rfl: 2  EXAM:  VITALS per patient if applicable:  GENERAL: alert, oriented, appears well and in no acute distress  PSYCH/NEURO: pleasant and cooperative, no obvious depression or anxiety, speech and thought  processing grossly intact  ASSESSMENT AND PLAN:  Discussed the following assessment and plan:  Upper respiratory tract infection, unspecified type - Plan: Novel Coronavirus, NAA ([facility]), POCT Influenza A/B Myalgia - Plan: Novel Coronavirus, NAA ([facility]), POCT Influenza A/B Close exposure to COVID-19 virus - Plan: Novel Coronavirus, NAA ([facility]), POCT Influenza A/B  Sinusitis, unspecified chronicity, unspecified location - Plan: azithromycin (ZITHROMAX) 250 MG tablet  Syncope, unspecified syncope type - Plan: Ambulatory referral to Cardiology Hypertension, unspecified type - Plan: Ambulatory referral to Cardiology Palpitations - Plan: Ambulatory referral to Cardiology  Watery eyes - Plan: Olopatadine HCl (PATADAY) 0.2 % SOLN  Dysphagia  F/u Dr. Allegra Lai 08/20/20  HM Flu shotnot had 2021  [place]  [place]  -consider prevnar and pna 23  Consider shingrix in future Pfizer covid 2/2booster 12/2021due  04/02/20 mammo normal  [place]  colonoscopy 10/15/06 mod to severe diverticulosis I[facility] f/u in 5 years  EGD 11/29/07 candida, mild gastritis  EGD 01/03/13/colonoscopy diverticulosis ext and int hemorrhoids TI with ulceration neg H pylori UGD 02/01/18 gastritis? If had colonoscopy GI established Vangaf/u 08/20/20  Pap4/2021 normal neg neg hpv  DEXA age 64osteopenia calcium + vit D repeat 3-5 years had 04/02/20  -we discussed possible serious and likely etiologies, options for evaluation and workup, limitations of telemedicine visit vs in person visit, treatment, treatment risks and precautions.   I discussed the assessment and treatment plan with the patient. The patient was provided an opportunity to ask questions and all were answered. The patient agreed with the plan and demonstrated an understanding of the instructions.    Time spent 30 min Bevelyn Buckles, MD

## 2020-07-23 NOTE — Progress Notes (Signed)
Symptom onset of Friday. Patient's mother in law had a cold.  Has not been tested.

## 2020-07-24 ENCOUNTER — Other Ambulatory Visit (INDEPENDENT_AMBULATORY_CARE_PROVIDER_SITE_OTHER): Payer: No Typology Code available for payment source

## 2020-07-24 ENCOUNTER — Other Ambulatory Visit: Payer: Self-pay | Admitting: Internal Medicine

## 2020-07-24 DIAGNOSIS — M791 Myalgia, unspecified site: Secondary | ICD-10-CM

## 2020-07-24 DIAGNOSIS — Z20822 Contact with and (suspected) exposure to covid-19: Secondary | ICD-10-CM

## 2020-07-24 DIAGNOSIS — J069 Acute upper respiratory infection, unspecified: Secondary | ICD-10-CM

## 2020-07-24 DIAGNOSIS — H1013 Acute atopic conjunctivitis, bilateral: Secondary | ICD-10-CM

## 2020-07-24 DIAGNOSIS — H04203 Unspecified epiphora, bilateral lacrimal glands: Secondary | ICD-10-CM

## 2020-07-24 LAB — POCT INFLUENZA A/B
Influenza A, POC: NEGATIVE
Influenza B, POC: NEGATIVE

## 2020-07-24 MED ORDER — AZELASTINE HCL 0.05 % OP SOLN: 1.0000 [drp] | Freq: Two times a day (BID) | OPHTHALMIC | 12 refills | Status: DC | PRN

## 2020-07-28 LAB — NOVEL CORONAVIRUS, NAA: SARS-CoV-2, NAA: DETECTED — AB

## 2020-07-29 ENCOUNTER — Encounter: Payer: Self-pay | Admitting: Internal Medicine

## 2020-08-01 ENCOUNTER — Other Ambulatory Visit: Payer: Self-pay | Admitting: Internal Medicine

## 2020-08-01 DIAGNOSIS — R059 Cough, unspecified: Secondary | ICD-10-CM

## 2020-08-02 ENCOUNTER — Telehealth: Payer: Self-pay | Admitting: Internal Medicine

## 2020-08-02 ENCOUNTER — Other Ambulatory Visit: Payer: Self-pay | Admitting: Internal Medicine

## 2020-08-02 DIAGNOSIS — U071 COVID-19: Secondary | ICD-10-CM

## 2020-08-02 DIAGNOSIS — J329 Chronic sinusitis, unspecified: Secondary | ICD-10-CM

## 2020-08-02 MED ORDER — PREDNISONE 20 MG PO TABS: 20.0000 mg | ORAL_TABLET | Freq: Every day | ORAL | 0 refills | Status: DC

## 2020-08-02 NOTE — Telephone Encounter (Signed)
-----   Message from Bevelyn Buckles, MD sent at 08/01/2020  1:06 PM EST ----- She was given zpack on the 07/23/20 prednisone would help if still having symptoms not more of this

## 2020-08-02 NOTE — Telephone Encounter (Signed)
Patient called in stated that she would like to get the prednisone now and would like the low dosage

## 2020-08-12 ENCOUNTER — Ambulatory Visit (INDEPENDENT_AMBULATORY_CARE_PROVIDER_SITE_OTHER): Payer: No Typology Code available for payment source | Admitting: Cardiology

## 2020-08-12 ENCOUNTER — Encounter: Payer: Self-pay | Admitting: Cardiology

## 2020-08-12 ENCOUNTER — Ambulatory Visit (INDEPENDENT_AMBULATORY_CARE_PROVIDER_SITE_OTHER): Payer: No Typology Code available for payment source

## 2020-08-12 ENCOUNTER — Other Ambulatory Visit: Payer: Self-pay

## 2020-08-12 VITALS — BP 138/83 | HR 91 | Ht 63.5 in | Wt 229.0 lb

## 2020-08-12 DIAGNOSIS — I10 Essential (primary) hypertension: Secondary | ICD-10-CM

## 2020-08-12 DIAGNOSIS — R002 Palpitations: Secondary | ICD-10-CM

## 2020-08-12 DIAGNOSIS — E7800 Pure hypercholesterolemia, unspecified: Secondary | ICD-10-CM

## 2020-08-12 DIAGNOSIS — R55 Syncope and collapse: Secondary | ICD-10-CM | POA: Diagnosis not present

## 2020-08-12 NOTE — Patient Instructions (Signed)
Medication Instructions:   Your physician recommends that you continue on your current medications as directed. Please refer to the Current Medication list given to you today.  *If you need a refill on your cardiac medications before your next appointment, please call your pharmacy*   Lab Work: None Ordered If you have labs (blood work) drawn today and your tests are completely normal, you will receive your results only by: Marland Kitchen MyChart Message (if you have MyChart) OR . A paper copy in the mail If you have any lab test that is abnormal or we need to change your treatment, we will call you to review the results.   Testing/Procedures:  1.  Your physician has requested that you have an echocardiogram. Echocardiography is a painless test that uses sound waves to create images of your heart. It provides your doctor with information about the size and shape of your heart and how well your heart's chambers and valves are working. This procedure takes approximately one hour. There are no restrictions for this procedure.  2.  Your physician has recommended that you wear a Zio monitor for 2 weeks. This monitor is a medical device that records the heart's electrical activity. Doctors most often use these monitors to diagnose arrhythmias. Arrhythmias are problems with the speed or rhythm of the heartbeat. The monitor is a small device applied to your chest. You can wear one while you do your normal daily activities. While wearing this monitor if you have any symptoms to push the button and record what you felt. Once you have worn this monitor for the period of time provider prescribed (Usually 14 days), you will return the monitor device in the postage paid box. Once it is returned they will download the data collected and provide [place] with a report which the provider will then review and we will call you with those results. Important tips:  1. Avoid showering during the first 24 hours of wearing the  monitor. 2. Avoid excessive sweating to help maximize wear time. 3. Do not submerge the device, no hot tubs, and no swimming pools. 4. Keep any lotions or oils away from the patch. 5. After 24 hours you may shower with the patch on. Take brief showers with your back facing the shower head.  6. Do not remove patch once it has been placed because that will interrupt data and decrease adhesive wear time. 7. Push the button when you have any symptoms and write down what you were feeling. 8. Once you have completed wearing your monitor, remove and place into box which has postage paid and place in your outgoing mailbox.  9. If for some reason you have misplaced your box then call our office and we can provide another box and/or mail it off for you.         Follow-Up: At [facility], you and your health needs are our priority.  As part of our continuing mission to provide you with exceptional heart care, we have created designated Provider Care Teams.  These Care Teams include your primary Cardiologist (physician) and Advanced Practice Providers (APPs -  Physician Assistants and Nurse Practitioners) who all work together to provide you with the care you need, when you need it.  We recommend signing up for the patient portal called "MyChart".  Sign up information is provided on this After Visit Summary.  MyChart is used to connect with patients for Virtual Visits (Telemedicine).  Patients are able to view lab/test results, encounter notes,  upcoming appointments, etc.  Non-urgent messages can be sent to your provider as well.   To learn more about what you can do with MyChart, go to [email or web address].    Your next appointment:   6 week(s)  The format for your next appointment:   In Person  Provider:   Brian Agbor-Etang, MD   Other Instructions   

## 2020-08-12 NOTE — Progress Notes (Signed)
Cardiology Office Note:    Date:  08/12/2020   ID:  Cheryl Coleman, DOB [birth year], MRN [id number]  PCP:  [name], [name], MD  [facility] Cardiologist:  Debbe Odea, MD  [facility] Electrophysiologist:  None   Referring MD: [name], French Ana *   Chief Complaint  Patient presents with  . NEW patient-referred by PCP for eval of palpitations/syncope   Cheryl Coleman is a 65 y.o. female who is being seen today for the evaluation of palpitations and syncope at the request of [name], French Ana *.   History of Present Illness:    Cheryl Coleman is a 64 y.o. female with a hx of hypertension, hyperlipidemia who presents due to palpitations and syncope.  She states passing out while eating dinner a month ago.  She was at home with family, was very hungry, and started eating very fast.  She felt like the food got stuck on his way to her stomach, she felt blood rushing to her head and she leaned over on the dinner table.  Few moments later, she could feel symptoms gradually go away.  patient states having symptoms of palpitations typically associated with food for years, worsening over the past several weeks.  She also endorses having palpitations/increased heart rates whenever she eats.  Symptoms occur daily, lasting few seconds.  She has had occasional episodes of presyncope, but not as bad as the event a month ago.  Denies any history of heart disease.  She has an appointment with GI next week.  Past Medical History:  Diagnosis Date  . Arthritis   . Asthma   . COVID-19    07/24/20  . Diverticulitis   . GERD (gastroesophageal reflux disease)   . Hiatal hernia   . History of blood transfusion   . History of chicken pox   . History of GI bleed    2013/14  . Hyperlipidemia   . Hypertension     Past Surgical History:  Procedure Laterality Date  . BREAST BIOPSY    . BREAST SURGERY     bx dense breast nl per pt   . CESAREAN SECTION     04/27/84    Current  Medications: Current Meds  Medication Sig  . albuterol (VENTOLIN HFA) 108 (90 Base) MCG/ACT inhaler Inhale 1-2 puffs into the lungs every 6 (six) hours as needed.  . ALPRAZolam (XANAX) 0.25 MG tablet Take 0.5-1 tablets (0.125-0.25 mg total) by mouth at bedtime as needed for anxiety.  . Ascorbic Acid (VITAMIN C PO) Take by mouth daily.  Marland Kitchen atorvastatin (LIPITOR) 10 MG tablet Take 1 tablet (10 mg total) by mouth daily at 6 PM.  . azelastine (OPTIVAR) 0.05 % ophthalmic solution Place 1 drop into both eyes 2 (two) times daily as needed.  Marland Kitchen CALCIUM PO Take by mouth daily.  Marland Kitchen diltiazem (CARDIZEM CD) 120 MG 24 hr capsule Take 1 capsule (120 mg total) by mouth daily.  . fluticasone (FLONASE) 50 MCG/ACT nasal spray Place into both nostrils daily.  . Fluticasone-Salmeterol (ADVAIR DISKUS) 100-50 MCG/DOSE AEPB Inhale 1 puff into the lungs in the morning and at bedtime. RINSE MOUTH.NOT GENERIC  . furosemide (LASIX) 20 MG tablet Take 0.5 tablets (10 mg total) by mouth daily as needed.  . montelukast (SINGULAIR) 10 MG tablet Take 1 tablet (10 mg total) by mouth at bedtime.  Marland Kitchen omeprazole (PRILOSEC) 40 MG capsule TAKE 1 CAPSULE BY MOUTH TWICE A DAY  . sodium chloride (OCEAN) 0.65 % SOLN nasal spray Place 1 spray into  both nostrils as needed for congestion.  Marland Kitchen. Specialty Vitamins Products (WOMENS VITA PAK PO) Take by mouth daily.  Marland Kitchen. VITAMIN D PO Take by mouth daily.     Allergies:   Oxycodone   Social History   Socioeconomic History  . Marital status: Married    Spouse name: Not on file  . Number of children: Not on file  . Years of education: Not on file  . Highest education level: Not on file  Occupational History  . Not on file  Tobacco Use  . Smoking status: Never Smoker  . Smokeless tobacco: Never Used  Vaping Use  . Vaping Use: Never used  Substance and Sexual Activity  . Alcohol use: Not Currently  . Drug use: Not Currently  . Sexual activity: Yes    Comment: husband   Other Topics  Concern  . 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 There is no history of Breast cancer.  ROS:   Please see the history of present illness.     All other systems reviewed and are negative.  EKGs/Labs/Other Studies Reviewed:    The following studies were reviewed today:   EKG:  EKG is  ordered today.  The ekg ordered today demonstrates sinus rhythm  Recent Labs: 05/09/2020: ALT 14 07/04/2020: BUN 13; Creatinine, Ser 1.08; Hemoglobin 13.3; Platelets 265; Potassium 3.7; Sodium 140  Recent Lipid Panel    Component Value Date/Time   CHOL 157 05/09/2020 0938   TRIG 91.0 05/09/2020 0938   HDL 56.50 05/09/2020 0938   CHOLHDL 3 05/09/2020 0938   VLDL 18.2 05/09/2020 0938   LDLCALC 82 05/09/2020 0938     Risk Assessment/Calculations:      Physical Exam:    VS:  BP 138/83 (BP Location: Right Arm, Patient Position: Sitting, Cuff Size: Large)   Pulse 91   Ht 5' 3.5" (1.613 m)   Wt 229 lb (103.9 kg)   SpO2 96%   BMI 39.93 kg/m     Wt Readings from Last 3 Encounters:  08/12/20 229 lb (103.9 kg)  07/23/20 220 lb (99.8 kg)  07/04/20 230 lb (104.3 kg)     GEN:  Well nourished, well developed in no acute distress HEENT: Normal NECK: No JVD; No carotid bruits LYMPHATICS: No lymphadenopathy CARDIAC: RRR, no murmurs, rubs, gallops RESPIRATORY:  Clear to auscultation without rales, wheezing or rhonchi  ABDOMEN: Soft, non-tender, non-distended MUSCULOSKELETAL:  No edema; No deformity   SKIN: Warm and dry NEUROLOGIC:  Alert and oriented x 3 PSYCHIATRIC:  Normal affect   ASSESSMENT:    1. Palpitations   2. Syncope, unspecified syncope type   3. Primary hypertension   4. Pure hypercholesterolemia    PLAN:    In order of problems listed above:  1. Patient with daily episodes of palpitations associated with food intake.  Unsure this is secondary to anxiety or arrhythmias.  Placed 2-week cardiac  monitor to evaluate any significant arrhythmias. 2. History of syncope, appears vasovagal.  Orthostatic vitals today with no evidence of orthostasis.  Get echocardiogram to complete syncopal work-up.  Cardiac monitor as above. 3. Hypertension, BP controlled.  Continue current BP meds. 4. Hyperlipidemia, continue statin.  Follow-up after echo and cardiac monitor.    Medication Adjustments/Labs and Tests Ordered: Current medicines are reviewed at length with the patient today.  Concerns regarding medicines are outlined above.  Orders Placed This Encounter  Procedures  . LONG TERM MONITOR (3-14 DAYS)  . EKG 12-Lead  . ECHOCARDIOGRAM COMPLETE   No orders of the defined types were placed in this encounter.   Patient Instructions  Medication Instructions:   Your physician recommends that you continue on your current medications as directed. Please refer to the Current Medication list given to you today.  *If you need a refill on your cardiac medications before your next appointment, please call your pharmacy*   Lab Work: None Ordered If you have labs (blood work) drawn today and your tests are completely normal, you will receive your results only by: Marland Kitchen MyChart Message (if you have MyChart) OR . A paper copy in the mail If you have any lab test that is abnormal or we need to change your treatment, we will call you to review the results.   Testing/Procedures:  1.  Your physician has requested that you have an echocardiogram. Echocardiography is a painless test that  uses sound waves to create images of your heart. It provides your doctor with information about the size and shape of your heart and how well your heart's chambers and valves are working. This procedure takes approximately one hour. There are no restrictions for this procedure.  2.  Your physician has recommended that you wear a Zio monitor for 2 weeks. This monitor is a medical device that records the heart's electrical activity. Doctors most often use these monitors to diagnose arrhythmias. Arrhythmias are problems with the speed or rhythm of the heartbeat. The monitor is a small device applied to your chest. You can wear one while you do your normal daily activities. While wearing this monitor if you have any symptoms to push the button and record what you felt. Once you have worn this monitor for the period of time provider prescribed (Usually 14 days), you will return the monitor device in the postage paid box. Once it is returned they will download the data collected and provide [place] with a report which the provider will then review and we will call you with those results. Important tips:  1. Avoid showering during the first 24 hours of wearing the monitor. 2. Avoid excessive sweating to help maximize wear time. 3. Do not submerge the device, no hot tubs, and no swimming pools. 4. Keep any lotions or oils away from the patch. 5. After 24 hours you may shower with the patch on. Take brief showers with your back facing the shower head.  6. Do not remove patch once it has been placed because that will interrupt data and decrease adhesive wear time. 7. Push the button when you have any symptoms and write down what you were feeling. 8. Once you have completed wearing your monitor, remove and place into box which has postage paid and place in your outgoing mailbox.  9. If for some reason you have misplaced your box then call our office and we can provide another box and/or mail it off for  you.  Follow-Up: At [facility], you and your health needs are our priority.  As part of our continuing mission to provide you with exceptional heart care, we have created designated Provider Care Teams.  These Care Teams include your primary Cardiologist (physician) and Advanced Practice Providers (APPs -  Physician Assistants and Nurse Practitioners) who all work together to provide you with the care you need, when you need it.  We recommend signing up for the patient portal called "MyChart".  Sign up information is provided on this After Visit Summary.  MyChart is used to connect with patients for Virtual Visits (Telemedicine).  Patients are able to view lab/test results, encounter notes, upcoming appointments, etc.  Non-urgent messages can be sent to your provider as well.   To learn more about what you can do with MyChart, go to [email or web address].    Your next appointment:   6 week(s)  The format for your next appointment:   In Person  Provider:   Debbe Odea, MD   Other Instructions      Signed, Debbe Odea, MD  08/12/2020 1:12 PM    [facility]

## 2020-08-20 ENCOUNTER — Encounter: Payer: Self-pay | Admitting: Gastroenterology

## 2020-08-20 ENCOUNTER — Ambulatory Visit (INDEPENDENT_AMBULATORY_CARE_PROVIDER_SITE_OTHER): Payer: No Typology Code available for payment source | Admitting: Gastroenterology

## 2020-08-20 ENCOUNTER — Other Ambulatory Visit: Payer: Self-pay

## 2020-08-20 VITALS — BP 143/85 | HR 85 | Temp 97.6°F | Ht 64.0 in | Wt 232.0 lb

## 2020-08-20 DIAGNOSIS — R1319 Other dysphagia: Secondary | ICD-10-CM | POA: Diagnosis not present

## 2020-08-20 DIAGNOSIS — K2100 Gastro-esophageal reflux disease with esophagitis, without bleeding: Secondary | ICD-10-CM | POA: Diagnosis not present

## 2020-08-20 DIAGNOSIS — R1310 Dysphagia, unspecified: Secondary | ICD-10-CM

## 2020-08-20 NOTE — Progress Notes (Signed)
[name], MD [address]  [address]  [place], [place] [address]  Main: [phone]  Fax: [phone]    Gastroenterology Consultation  Referring Provider:     [name], French Ana * Primary Care Physician:  [name], [name], MD Primary Gastroenterologist:  Dr. [name] Reason for Consultation: GERD, chronic constipation        HPI:   Cheryl Coleman is a 65 y.o. female referred by Dr. Judie Grieve, [name], MD  for consultation & management of GERD, chronic constipation  GERD: Patient has history of chronic GERD, underwent several EGDs in the past which did not reveal erosive esophagitis, showed intraepithelial lymphocytes on biopsies. Patient reports having heartburn associated with regurgitation for several months, including nocturnal symptoms.  Currently taking omeprazole 40 mg once a day which provides partial relief only.  She also reports localized pain in her epigastric area.  Patient has gained about 25 pounds within last 1 year and patient does not feel her normal self because she was not this heavy before.  She is planning to go on a [employer] diet. She consumes rice and steak regularly She moved in later part of 2019 from [place] to [place].  Since then with the onset of pandemic, she has not been able to go out nor visit her children. Has been working from home.  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  She has been going back and forth between [place] and [place] due to some personal issues.  She found her life to be very stressful staying home, and discontinue place within last 1 year.  Patient is originally from [place]  Chronic constipation: This has also started within last 1 year, has gotten worse due to decreased physical activity and lack of fiber in her diet.  She takes MiraLAX as needed only. She underwent colonoscopy in 2014 which revealed superficial  ulceration in the terminal ileum, biopsies revealed chronic inflammation.  Patient reported that she had a repeat colonoscopy in 2019, report is not available with me today.  Patient denies abdominal pain, rectal bleeding, nausea or vomiting  She does not smoke or drink alcohol She denies family history of GI malignancy  Follow-up visit 08/20/2020 Patient is here to discuss about her upper GI symptoms.  She reports that about a month ago, she did not eat all day and was having dinner, and she was hungry, trying to eat fast, felt like the food got stuck in the substernal area, followed by syncope.  She went to ER, she was told that she had vasovagal attack.  Since then, patient reports that every time she has dinner, she has been extra careful and little anxious, chews thoroughly.  She has been on omeprazole 40 mg twice daily for about a year and she thinks it is not helping.  She does report feeling gassy.  She reports that her bowel movements are fairly regular.  She has been in [place] most of the year in 2021, taking care of her granddaughter and therefore canceled her upper endoscopy.  She had history of Candida esophagitis  NSAIDs: None  Antiplts/Anticoagulants/Anti thrombotics: None  GI Procedures:   EGD 02/01/2018 at [facility]    EGD and colonoscopy 01/03/2013 at [facility]      [facility]/29/[facility] at [facility]  No evidence of H. Pylori  Past Medical History:  Diagnosis Date  . Arthritis   .  Asthma   . COVID-19    07/24/20  . Diverticulitis   . GERD (gastroesophageal reflux disease)   . Hiatal hernia   . History of blood transfusion   . History of chicken pox   . History of GI bleed    2013/14  . Hyperlipidemia   . Hypertension     Past Surgical History:  Procedure Laterality Date  . BREAST BIOPSY    . BREAST SURGERY     bx dense breast nl per pt   . CESAREAN SECTION     04/27/84    Current Outpatient Medications:  .  albuterol  (VENTOLIN HFA) 108 (90 Base) MCG/ACT inhaler, Inhale 1-2 puffs into the lungs every 6 (six) hours as needed., Disp: 18 g, Rfl: 11 .  Ascorbic Acid (VITAMIN C PO), Take by mouth daily., Disp: , Rfl:  .  atorvastatin (LIPITOR) 10 MG tablet, Take 1 tablet (10 mg total) by mouth daily at 6 PM., Disp: 90 tablet, Rfl: 3 .  CALCIUM PO, Take by mouth daily., Disp: , Rfl:  .  diltiazem (CARDIZEM CD) 120 MG 24 hr capsule, Take 1 capsule (120 mg total) by mouth daily., Disp: 90 capsule, Rfl: 3 .  fluticasone (FLONASE) 50 MCG/ACT nasal spray, Place into both nostrils daily., Disp: , Rfl:  .  Fluticasone-Salmeterol (ADVAIR DISKUS) 100-50 MCG/DOSE AEPB, Inhale 1 puff into the lungs in the morning and at bedtime. RINSE MOUTH.NOT GENERIC, Disp: 60 each, Rfl: 12 .  montelukast (SINGULAIR) 10 MG tablet, Take 1 tablet (10 mg total) by mouth at bedtime., Disp: 90 tablet, Rfl: 3 .  omeprazole (PRILOSEC) 40 MG capsule, TAKE 1 CAPSULE BY MOUTH TWICE A DAY, Disp: 180 capsule, Rfl: 0 .  sodium chloride (OCEAN) 0.65 % SOLN nasal spray, Place 1 spray into both nostrils as needed for congestion., Disp: 30 mL, Rfl: 11 .  Specialty Vitamins Products (WOMENS VITA PAK PO), Take by mouth daily., Disp: , Rfl:  .  VITAMIN D PO, Take by mouth daily., Disp: , Rfl:  .  ALPRAZolam (XANAX) 0.25 MG tablet, Take 0.5-1 tablets (0.125-0.25 mg total) by mouth at bedtime as needed for anxiety. (Patient not taking: Reported on 08/20/2020), Disp: 30 tablet, Rfl: 2 .  azelastine (OPTIVAR) 0.05 % ophthalmic solution, Place 1 drop into both eyes 2 (two) times daily as needed. (Patient not taking: Reported on 08/20/2020), Disp: 6 mL, Rfl: 12 .  furosemide (LASIX) 20 MG tablet, Take 0.5 tablets (10 mg total) by mouth daily as needed. (Patient not taking: Reported on 08/20/2020), Disp: 30 tablet, Rfl: 5   Family History  Problem Relation Age of Onset  . Hypertension Maternal Aunt   . Diabetes Maternal Aunt   . Breast cancer Neg Hx      Social History    Tobacco Use  . Smoking status: Never Smoker  . Smokeless tobacco: Never Used  Vaping Use  . Vaping Use: Never used  Substance Use Topics  . Alcohol use: Not Currently  . Drug use: Not Currently    Allergies as of 08/20/2020 - Review Complete 08/20/2020  Allergen Reaction Noted  . Oxycodone Other (See Comments) 08/18/2018    Review of Systems:    All systems reviewed and negative except where noted in HPI.   Physical Exam:  BP (!) 143/85   Pulse 85   Temp 97.6 F (36.4 C) (Oral)   Ht 5\' 4"  (1.626 m)   Wt 232 lb (105.2 kg)   BMI 39.82 kg/m  No  LMP recorded. Patient is postmenopausal.  General:   Alert,  Well-developed, well-nourished, pleasant and cooperative in NAD Head:  Normocephalic and atraumatic. Eyes:  Sclera clear, no icterus.   Conjunctiva pink. Ears:  Normal auditory acuity. Nose:  No deformity, discharge, or lesions. Mouth:  No deformity or lesions,oropharynx pink & moist. Neck:  Supple; no masses or thyromegaly. Lungs:  Respirations even and unlabored.  Clear throughout to auscultation.   No wheezes, crackles, or rhonchi. No acute distress. Heart:  Regular rate and rhythm; no murmurs, clicks, rubs, or gallops. Abdomen:  Normal bowel sounds. Soft, non-tender and mildly distended, tympanic without masses, hepatosplenomegaly or hernias noted.  No guarding or rebound tenderness.   Rectal: Not performed Msk:  Symmetrical without gross deformities. Good, equal movement & strength bilaterally. Pulses:  Normal pulses noted. Extremities:  No clubbing or edema.  No cyanosis. Neurologic:  Alert and oriented x3;  grossly normal neurologically. Skin:  Intact without significant lesions or rashes. No jaundice. Psych:  Alert and cooperative. Normal mood and affect.  Imaging Studies: No abdominal imaging  Assessment and Plan:   Cheryl Coleman is a 65 y.o. female with morbid obesity, hypertension, hyperlipidemia, chronic GERD is seen in consultation for chronic GERD and  dysphagia   Chronic GERD and dysphagia Recommend EGD with esophageal biopsies Continue omeprazole 40 mg 2 times a day before meals Discussed about cutting back on carbonated beverages  Small bowel villous blunting based on the colonoscopy in 2014 Recommend duodenal biopsies during EGD Reminded patient again to obtain copy of colonoscopy report from 2019   Follow up in 3 months   [name], MD

## 2020-08-21 ENCOUNTER — Other Ambulatory Visit: Payer: Self-pay | Admitting: Cardiology

## 2020-08-21 DIAGNOSIS — R55 Syncope and collapse: Secondary | ICD-10-CM

## 2020-08-27 ENCOUNTER — Other Ambulatory Visit
Admission: RE | Admit: 2020-08-27 | Discharge: 2020-08-27 | Disposition: A | Discharge status: Home / Self Care | Payer: No Typology Code available for payment source | Source: Ambulatory Visit | Attending: Gastroenterology | Admitting: Gastroenterology

## 2020-08-27 ENCOUNTER — Other Ambulatory Visit: Payer: Self-pay

## 2020-08-27 ENCOUNTER — Other Ambulatory Visit: Payer: No Typology Code available for payment source

## 2020-08-27 DIAGNOSIS — Z01812 Encounter for preprocedural laboratory examination: Secondary | ICD-10-CM | POA: Insufficient documentation

## 2020-08-27 DIAGNOSIS — Z20822 Contact with and (suspected) exposure to covid-19: Secondary | ICD-10-CM | POA: Insufficient documentation

## 2020-08-27 LAB — SARS CORONAVIRUS 2 (TAT 6-24 HRS): SARS Coronavirus 2: NEGATIVE

## 2020-08-29 ENCOUNTER — Other Ambulatory Visit: Payer: Self-pay

## 2020-08-29 ENCOUNTER — Ambulatory Visit: Payer: No Typology Code available for payment source | Admitting: Certified Registered Nurse Anesthetist

## 2020-08-29 ENCOUNTER — Encounter: Payer: Self-pay | Admitting: Gastroenterology

## 2020-08-29 ENCOUNTER — Other Ambulatory Visit: Payer: No Typology Code available for payment source

## 2020-08-29 ENCOUNTER — Encounter
Admission: RE | Disposition: A | Discharge status: Home / Self Care | Payer: Self-pay | Source: Home / Self Care | Attending: Gastroenterology

## 2020-08-29 ENCOUNTER — Ambulatory Visit
Admission: RE | Admit: 2020-08-29 | Discharge: 2020-08-29 | Disposition: A | Discharge status: Home / Self Care | Payer: No Typology Code available for payment source | Attending: Gastroenterology | Admitting: Gastroenterology

## 2020-08-29 DIAGNOSIS — Z8616 Personal history of COVID-19: Secondary | ICD-10-CM | POA: Diagnosis not present

## 2020-08-29 DIAGNOSIS — R1310 Dysphagia, unspecified: Secondary | ICD-10-CM | POA: Diagnosis not present

## 2020-08-29 DIAGNOSIS — K2950 Unspecified chronic gastritis without bleeding: Secondary | ICD-10-CM | POA: Insufficient documentation

## 2020-08-29 DIAGNOSIS — R1314 Dysphagia, pharyngoesophageal phase: Secondary | ICD-10-CM | POA: Diagnosis not present

## 2020-08-29 DIAGNOSIS — K219 Gastro-esophageal reflux disease without esophagitis: Secondary | ICD-10-CM | POA: Diagnosis not present

## 2020-08-29 DIAGNOSIS — Z7951 Long term (current) use of inhaled steroids: Secondary | ICD-10-CM | POA: Insufficient documentation

## 2020-08-29 DIAGNOSIS — Z79899 Other long term (current) drug therapy: Secondary | ICD-10-CM | POA: Insufficient documentation

## 2020-08-29 DIAGNOSIS — K449 Diaphragmatic hernia without obstruction or gangrene: Secondary | ICD-10-CM | POA: Insufficient documentation

## 2020-08-29 HISTORY — PX: ESOPHAGOGASTRODUODENOSCOPY (EGD) WITH PROPOFOL: SHX5813

## 2020-08-29 HISTORY — DX: COVID-19: U07.1

## 2020-08-29 SURGERY — ESOPHAGOGASTRODUODENOSCOPY (EGD) WITH PROPOFOL
Anesthesia: General

## 2020-08-29 MED ORDER — LIDOCAINE HCL (CARDIAC) PF 100 MG/5ML IV SOSY
PREFILLED_SYRINGE | INTRAVENOUS | Status: DC | PRN
  Administered 2020-08-29: 50 mg via INTRAVENOUS

## 2020-08-29 MED ORDER — PROPOFOL 500 MG/50ML IV EMUL
INTRAVENOUS | Status: AC
  Filled 2020-08-29: qty 50

## 2020-08-29 MED ORDER — SODIUM CHLORIDE 0.9 % IV SOLN: INTRAVENOUS | Status: DC

## 2020-08-29 MED ORDER — PROPOFOL 500 MG/50ML IV EMUL
INTRAVENOUS | Status: DC | PRN
  Administered 2020-08-29: 150 ug/kg/min via INTRAVENOUS

## 2020-08-29 MED ORDER — LIDOCAINE HCL (PF) 2 % IJ SOLN
INTRAMUSCULAR | Status: AC
  Filled 2020-08-29: qty 5

## 2020-08-29 MED ORDER — PROPOFOL 10 MG/ML IV BOLUS
INTRAVENOUS | Status: DC | PRN
  Administered 2020-08-29: 60 mg via INTRAVENOUS

## 2020-08-29 NOTE — Transfer of Care (Signed)
Immediate Anesthesia Transfer of Care Note  Patient: Cheryl Coleman  Procedure(s) Performed: ESOPHAGOGASTRODUODENOSCOPY (EGD) WITH PROPOFOL (N/A )  Patient Location: PACU  Anesthesia Type:General  Level of Consciousness: drowsy  Airway & Oxygen Therapy: Patient Spontanous Breathing  Post-op Assessment: Report given to RN and Post -op Vital signs reviewed and stable  Post vital signs: Reviewed and stable  Last Vitals:  Vitals Value Taken Time  BP 136/88 08/29/20 0927  Temp 35.7 C 08/29/20 0927  Pulse 95 08/29/20 0928  Resp 24 08/29/20 0928  SpO2 94 % 08/29/20 0928  Vitals shown include unvalidated device data.  Last Pain:  Vitals:   08/29/20 0927  TempSrc: Temporal  PainSc: Asleep         Complications: No complications documented.

## 2020-08-29 NOTE — H&P (Signed)
[name], MD [address]  [address]  [place], [place] [address]  Main: [phone]  Fax: [phone] Pager: [phone]  Primary Care Physician:  [name], [name], MD Primary Gastroenterologist:  Dr. [name]  Pre-Procedure History & Physical: HPI:  Cheryl Coleman is a 65 y.o. female is here for an endoscopy.   Past Medical History:  Diagnosis Date  . Arthritis   . Asthma   . COVID   . COVID-19    07/24/20  . Diverticulitis   . GERD (gastroesophageal reflux disease)   . Hiatal hernia   . History of blood transfusion   . History of chicken pox   . History of GI bleed    2013/14  . Hyperlipidemia   . Hypertension     Past Surgical History:  Procedure Laterality Date  . BREAST BIOPSY    . BREAST SURGERY     bx dense breast nl per pt   . CESAREAN SECTION     04/27/84  . COLONOSCOPY    . ORIF ACETABULAR FRACTURE      Prior to Admission medications   Medication Sig Start Date End Date Taking? Authorizing Provider  albuterol (VENTOLIN HFA) 108 (90 Base) MCG/ACT inhaler Inhale 1-2 puffs into the lungs every 6 (six) hours as needed. 10/04/19  Yes [name], [name], MD  diltiazem (CARDIZEM CD) 120 MG 24 hr capsule Take 1 capsule (120 mg total) by mouth daily. 04/24/20  Yes [name], [name], MD  fluticasone (FLONASE) 50 MCG/ACT nasal spray Place into both nostrils daily.   Yes [provider]  Fluticasone-Salmeterol (ADVAIR DISKUS) 100-50 MCG/DOSE AEPB Inhale 1 puff into the lungs in the morning and at bedtime. RINSE MOUTH.NOT GENERIC 11/03/19  Yes [name], [name], MD  furosemide (LASIX) 20 MG tablet Take 0.5 tablets (10 mg total) by mouth daily as needed. 05/09/20  Yes [name], [name], MD  montelukast (SINGULAIR) 10 MG tablet Take 1 tablet (10 mg total) by mouth at bedtime. 07/05/20  Yes [name], [name], MD  omeprazole (PRILOSEC) 40 MG capsule TAKE 1 CAPSULE BY MOUTH TWICE A DAY 04/02/20  Yes Tyshana Nishida, Loel Dubonnet, MD  sodium chloride (OCEAN) 0.65 % SOLN nasal spray Place 1 spray into both nostrils as needed for congestion. 11/03/19  Yes [name], [name], MD  ALPRAZolam Prudy Feeler) 0.25 MG tablet Take 0.5-1 tablets (0.125-0.25 mg total) by mouth at bedtime as needed for anxiety. Patient not taking: No sig reported 05/09/20   [name], [name], MD  Ascorbic Acid (VITAMIN C PO) Take by mouth daily.    [provider]  atorvastatin (LIPITOR) 10 MG tablet Take 1 tablet (10 mg total) by mouth daily at 6 PM. 03/08/20   [name], [name], MD  azelastine (OPTIVAR) 0.05 % ophthalmic solution Place 1 drop into both eyes 2 (two) times daily as needed. Patient not taking: No sig reported 07/24/20   [name], [name], MD  CALCIUM PO Take by mouth daily.    [provider]  Specialty Vitamins Products (WOMENS VITA PAK PO) Take by mouth daily.    [provider]  VITAMIN D PO Take by mouth daily.    [provider]    Allergies as of 08/20/2020 - Review Complete 08/20/2020  Allergen Reaction Noted  . Oxycodone Other (See Comments) 08/18/2018    Family History  Problem Relation Age of Onset  . Hypertension Maternal Aunt   . Diabetes Maternal Aunt   . Breast cancer Neg Hx  Social History   Socioeconomic History  . Marital status: Married    Spouse name: Not on file  . Number of children: Not on file  . Years of education: Not on file  . Highest education level: Not on file  Occupational History  . Not on file  Tobacco Use  . Smoking status: Never Smoker  . Smokeless tobacco: Never Used  Vaping Use  . Vaping Use: Never used  Substance and Sexual Activity  . Alcohol use: Not Currently  . Drug use: Not Currently  . Sexual activity: Yes    Comment: husband   Other Topics Concern  . 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 Neck:  Supple; no masses or thyromegaly. Lungs:  Clear throughout to auscultation.    Heart:  Regular rate and rhythm. Abdomen:  Soft, nontender and nondistended. Normal bowel sounds, without guarding, and without rebound.   Neurologic:  Alert and  oriented x4;  grossly normal neurologically.  Impression/Plan: Cheryl Coleman is here for an endoscopy to be performed for dysphagia  Risks, benefits, limitations, and alternatives regarding  endoscopy have been reviewed with the patient.  Questions have been answered.  All parties agreeable.   Laurina Bustle, MD  08/29/2020, 8:24 AM

## 2020-08-29 NOTE — Op Note (Signed)
[facility] Gastroenterology Patient Name: Cheryl Coleman Procedure Date: 08/29/2020 8:54 AM MRN: [id number] Account #: [id number] Date of Birth: November 11, 1955 Admit Type: Outpatient Age: 65 Room: [facility] ENDO ROOM 4 Gender: Female Note Status: Finalized Procedure:             Upper GI endoscopy Indications:           Esophageal dysphagia Providers:             [name] MD, MD Referring MD:          [name] [name] MD, MD (Referring MD) Medicines:             General Anesthesia Complications:         No immediate complications. Estimated blood loss: None. Procedure:             Pre-Anesthesia Assessment:                        - Prior to the procedure, a History and Physical was                         performed, and patient medications and allergies were                         reviewed. The patient is competent. The risks and                         benefits of the procedure and the sedation options and                         risks were discussed with the patient. All questions                         were answered and informed consent was obtained.                         Patient identification and proposed procedure were                         verified by the physician, the nurse, the                         anesthesiologist, the anesthetist and the technician                         in the pre-procedure area in the procedure room in the                         endoscopy suite. Mental Status Examination: alert and                         oriented. Airway Examination: normal oropharyngeal                         airway and neck mobility. Respiratory Examination:                         clear to auscultation. CV Examination: normal.  Prophylactic Antibiotics: The patient does not require                         prophylactic antibiotics. Prior Anticoagulants: The                         patient has taken no previous anticoagulant  or                         antiplatelet agents. ASA Grade Assessment: III - A                         patient with severe systemic disease. After reviewing                         the risks and benefits, the patient was deemed in                         satisfactory condition to undergo the procedure. The                         anesthesia plan was to use general anesthesia.                         Immediately prior to administration of medications,                         the patient was re-assessed for adequacy to receive                         sedatives. The heart rate, respiratory rate, oxygen                         saturations, blood pressure, adequacy of pulmonary                         ventilation, and response to care were monitored                         throughout the procedure. The physical status of the                         patient was re-assessed after the procedure.                        After obtaining informed consent, the endoscope was                         passed under direct vision. Throughout the procedure,                         the patient's blood pressure, pulse, and oxygen                         saturations were monitored continuously. The Endoscope                         was introduced through the mouth, and advanced to the  second part of duodenum. The upper GI endoscopy was                         accomplished without difficulty. The patient tolerated                         the procedure well. Findings:      The examined duodenum was normal. Biopsies were taken with a cold       forceps for histology.      Diffuse mildly erythematous mucosa without bleeding was found in the       gastric body. Biopsies were taken with a cold forceps for Helicobacter       pylori testing.      The incisura and gastric antrum were normal. Biopsies were taken with a       cold forceps for Helicobacter pylori testing.      The cardia and gastric  fundus were normal on retroflexion.      A small hiatal hernia was present.      Esophagogastric landmarks were identified: the gastroesophageal junction       was found at 38 cm from the incisors.      The gastroesophageal junction and examined esophagus were normal.       Biopsies were taken with a cold forceps for histology. Impression:            - Normal examined duodenum. Biopsied.                        - Erythematous mucosa in the gastric body. Biopsied.                        - Normal incisura and antrum. Biopsied.                        - Small hiatal hernia.                        - Esophagogastric landmarks identified.                        - Normal gastroesophageal junction and esophagus.                         Biopsied. Recommendation:        - Await pathology results.                        - Discharge patient to home (with escort).                        - Resume previous diet today.                        - Continue present medications.                        - Follow an antireflux regimen. Procedure Code(s):     --- Professional ---                        [phone], Esophagogastroduodenoscopy, flexible,  transoral; with biopsy, single or multiple Diagnosis Code(s):     --- Professional ---                        K31.89, Other diseases of stomach and duodenum                        K44.9, Diaphragmatic hernia without obstruction or                         gangrene                        R13.14, Dysphagia, pharyngoesophageal phase CPT copyright 2019 American Medical Association. All rights reserved. The codes documented in this report are preliminary and upon coder review may  be revised to meet current compliance requirements. Dr. Libby Maw [name] MD, MD 08/29/2020 9:25:42 AM This report has been signed electronically. Number of Addenda: 0 Note Initiated On: 08/29/2020 8:54 AM Estimated Blood Loss:  Estimated blood loss: none.       [facility]

## 2020-08-29 NOTE — Anesthesia Postprocedure Evaluation (Signed)
Anesthesia Post Note  Patient: Careers adviser  Procedure(s) Performed: ESOPHAGOGASTRODUODENOSCOPY (EGD) WITH PROPOFOL (N/A )  Patient location during evaluation: PACU Anesthesia Type: General Level of consciousness: awake and alert Pain management: pain level controlled Vital Signs Assessment: post-procedure vital signs reviewed and stable Respiratory status: spontaneous breathing, nonlabored ventilation, respiratory function stable and patient connected to nasal cannula oxygen Cardiovascular status: blood pressure returned to baseline and stable Postop Assessment: no apparent nausea or vomiting Anesthetic complications: no   No complications documented.   Last Vitals:  Vitals:   08/29/20 0819 08/29/20 0927  BP: (!) 159/98 136/88  Pulse: 100   Resp: 20   Temp: 36.9 C (!) 35.7 C  SpO2: 99%     Last Pain:  Vitals:   08/29/20 0927  TempSrc: Temporal  PainSc: Asleep                 Yevette Edwards

## 2020-08-29 NOTE — Anesthesia Preprocedure Evaluation (Signed)
Anesthesia Evaluation  Patient identified by MRN, date of birth, ID band Patient awake    Reviewed: Allergy & Precautions, H&P , NPO status , Patient's Chart, lab work & pertinent test results, reviewed documented beta blocker date and time   Airway Mallampati: II   Neck ROM: full    Dental  (+) Poor Dentition   Pulmonary asthma ,    Pulmonary exam normal        Cardiovascular Exercise Tolerance: Good hypertension, On Medications negative cardio ROS Normal cardiovascular exam Rhythm:regular Rate:Normal     Neuro/Psych Anxiety negative neurological ROS  negative psych ROS   GI/Hepatic Neg liver ROS, hiatal hernia, GERD  Medicated,  Endo/Other  negative endocrine ROS  Renal/GU negative Renal ROS  negative genitourinary   Musculoskeletal   Abdominal   Peds  Hematology negative hematology ROS (+)   Anesthesia Other Findings Past Medical History: No date: Arthritis No date: Asthma No date: COVID No date: COVID-19     Comment:  07/24/20 No date: Diverticulitis No date: GERD (gastroesophageal reflux disease) No date: Hiatal hernia No date: History of blood transfusion No date: History of chicken pox No date: History of GI bleed     Comment:  2013/14 No date: Hyperlipidemia No date: Hypertension Past Surgical History: No date: BREAST BIOPSY No date: BREAST SURGERY     Comment:  bx dense breast nl per pt  No date: CESAREAN SECTION     Comment:  04/27/84 No date: COLONOSCOPY No date: ORIF ACETABULAR FRACTURE BMI    Body Mass Index: 39.48 kg/m     Reproductive/Obstetrics negative OB ROS                             Anesthesia Physical Anesthesia Plan  ASA: III  Anesthesia Plan: General   Post-op Pain Management:    Induction:   PONV Risk Score and Plan:   Airway Management Planned:   Additional Equipment:   Intra-op Plan:   Post-operative Plan:   Informed Consent: I  have reviewed the patients History and Physical, chart, labs and discussed the procedure including the risks, benefits and alternatives for the proposed anesthesia with the patient or authorized representative who has indicated his/her understanding and acceptance.     Dental Advisory Given  Plan Discussed with: CRNA  Anesthesia Plan Comments:         Anesthesia Quick Evaluation

## 2020-08-30 ENCOUNTER — Encounter: Payer: Self-pay | Admitting: Gastroenterology

## 2020-09-02 ENCOUNTER — Telehealth: Payer: Self-pay

## 2020-09-02 LAB — SURGICAL PATHOLOGY

## 2020-09-02 NOTE — Telephone Encounter (Signed)
Patient verbalized understanding of results. States she has enough of the medication

## 2020-09-02 NOTE — Telephone Encounter (Signed)
-----   Message from [name], MD sent at 09/02/2020  1:09 PM EST ----- Please inform patient that the biopsy results from recent upper endoscopy shows reflux changes in her esophagus. Continue omeprazole 40 mg twice daily for 1 more month  RV

## 2020-09-03 ENCOUNTER — Telehealth: Payer: Self-pay

## 2020-09-03 NOTE — Telephone Encounter (Signed)
Called patient and [facility] for a call back for  Dr. Rudean Curt result note as copied and pasted below.  Paroxysmal SVTs noted which might explain symptoms of palpitations. No significant arrhythmias to explain syncope/passing out. Keep follow-up appointment, may consider beta-blocker to help with symptoms of palpitations.

## 2020-09-09 ENCOUNTER — Telehealth: Payer: Self-pay

## 2020-09-09 NOTE — Telephone Encounter (Signed)
Mailed  Patient informed and verified address on file

## 2020-09-09 NOTE — Telephone Encounter (Signed)
Pt called and states that she needs a copy of her positive covid test performed on 07/24/20. Not the one in feb. Please send by mail so she can receive pay from her job. Thanks

## 2020-09-12 ENCOUNTER — Other Ambulatory Visit: Payer: Self-pay

## 2020-09-12 ENCOUNTER — Ambulatory Visit (INDEPENDENT_AMBULATORY_CARE_PROVIDER_SITE_OTHER): Payer: No Typology Code available for payment source

## 2020-09-12 DIAGNOSIS — R55 Syncope and collapse: Secondary | ICD-10-CM

## 2020-09-12 LAB — ECHOCARDIOGRAM COMPLETE
Area-P 1/2: 4.68 cm2
S' Lateral: 2.6 cm

## 2020-09-13 ENCOUNTER — Telehealth: Payer: Self-pay | Admitting: Internal Medicine

## 2020-09-13 NOTE — Telephone Encounter (Signed)
Patient called to let Dr. French Ana know that when she had her cardiogram it showed she had gallstones. She was advised to call her primary care provider and her GI.

## 2020-09-13 NOTE — Telephone Encounter (Signed)
Spoke with patient and gave her result notes.

## 2020-09-13 NOTE — Telephone Encounter (Signed)
I dont see mention of this in the echo report  To truly diagnose does she want [place] to order [place] of abdomen?

## 2020-09-16 ENCOUNTER — Telehealth: Payer: Self-pay | Admitting: Gastroenterology

## 2020-09-16 NOTE — Telephone Encounter (Signed)
She had an echo which is an ultrasound of her heart, not her gallbladder.  Therefore, we do not know if she has gallstones  RV

## 2020-09-16 NOTE — Telephone Encounter (Signed)
Patient states that she had an  Echocardioqram and that she. has GallStones.  Patient wants Dr Allegra Lai to know this.

## 2020-09-16 NOTE — Telephone Encounter (Signed)
Left message to return call 

## 2020-09-17 NOTE — Telephone Encounter (Signed)
Called and left a message for call back  

## 2020-09-17 NOTE — Telephone Encounter (Signed)
Patient verbalized understanding. She states the ultrasound tech told her off the record and showed her she had gallstones. She states she will call her pcp

## 2020-09-18 ENCOUNTER — Other Ambulatory Visit: Payer: Self-pay | Admitting: Internal Medicine

## 2020-09-18 DIAGNOSIS — J309 Allergic rhinitis, unspecified: Secondary | ICD-10-CM

## 2020-09-19 ENCOUNTER — Telehealth: Payer: Self-pay | Admitting: Internal Medicine

## 2020-09-19 DIAGNOSIS — R1084 Generalized abdominal pain: Secondary | ICD-10-CM

## 2020-09-19 NOTE — Telephone Encounter (Signed)
Patient states that while getting the Echo done the tech did see something on the screen   Patient is agreeable to the ultrasound.

## 2020-09-19 NOTE — Telephone Encounter (Addendum)
Any test ordered out side a visit requires a telephone encounter billing to insurance will order US abdomen please inform will bill insurance for telephone encounter for work up outside of a visit        Documentation     Tilford Pillar, CMA  You 10 minutes ago (3:50 PM)         Patient states that while getting the Echo done the tech did see something on the screen   Patient is agreeable to the ultrasound.       Documentation    Tilford Pillar, CMA 3 days ago       Left message to return call.      Documentation     You  Tilford Pillar, CMA 6 days ago   TM    I dont see mention of this in the echo report  To truly diagnose does she want [place] to order [place] of abdomen?        Documentation    Elise Benne T, CMA routed conversation to You 6 days ago   Sheaffer-Poudrier, Colin Broach routed conversation to Tilford Pillar, CMA 6 days ago   Sheaffer-Poudrier, Colin Broach 6 days ago   [place]    Patient called to let Dr. French Ana know that when she had her cardiogram it showed she had gallstones. She was advised to call her primary care provider and her GI.     A/p -telephone note  R/o GB issue with [place]  Ed my my chart fee for w/u outside of visit   Dr. French Ana [name] Time spent 5-10 min

## 2020-09-19 NOTE — Telephone Encounter (Signed)
Any test ordered out side a visit requires a telephone encounter billing to insurance will order US abdomen please inform will bill insurance for telephone encounter for work up outside of a visit

## 2020-09-20 NOTE — Telephone Encounter (Signed)
Noted  

## 2020-09-20 NOTE — Telephone Encounter (Signed)
Good morning!  Pt has been scheduled on 03/15. Pt has been notified.

## 2020-09-20 NOTE — Telephone Encounter (Signed)
See telephone encounter from 09/19/20.

## 2020-09-24 ENCOUNTER — Ambulatory Visit (INDEPENDENT_AMBULATORY_CARE_PROVIDER_SITE_OTHER): Payer: No Typology Code available for payment source | Admitting: Cardiology

## 2020-09-24 ENCOUNTER — Other Ambulatory Visit: Payer: Self-pay

## 2020-09-24 ENCOUNTER — Encounter: Payer: Self-pay | Admitting: Cardiology

## 2020-09-24 VITALS — BP 120/70 | HR 89 | Ht 64.0 in | Wt 227.2 lb

## 2020-09-24 DIAGNOSIS — R002 Palpitations: Secondary | ICD-10-CM

## 2020-09-24 DIAGNOSIS — E7800 Pure hypercholesterolemia, unspecified: Secondary | ICD-10-CM

## 2020-09-24 DIAGNOSIS — R55 Syncope and collapse: Secondary | ICD-10-CM | POA: Diagnosis not present

## 2020-09-24 NOTE — Progress Notes (Signed)
Cardiology Office Note:    Date:  09/24/2020   ID:  Cheryl Coleman, DOB Oct 22, 1955, MRN [id number]  PCP:  [name], [name], MD  [facility] Cardiologist:  Debbe Odea, MD  [facility] Electrophysiologist:  None   Referring MD: [name], French Ana *   Chief Complaint  Patient presents with  . Other    6 wk f/u echo and zio. Meds reviewed verbally with pt.     History of Present Illness:    Cheryl Coleman is a 65 y.o. female with a hx of hypertension, hyperlipidemia who presents for follow-up.  She was last seen due to palpitations and syncope.    She had one episode of passing out while eating dinner.  She felt like food getting stuck in her stomach.  Also describes palpitations which she has had for years but worsened of late.  Cardiac monitor was placed to evaluate any significant arrhythmias, echocardiogram ordered to evaluate any structural heart abnormalities.  She has been taking Cardizem for at least 8 years due to symptoms of tachycardia, baseline heart rate in the 100s.  She feels well, denies any other episode of syncope, palpitations/heart flutters overall have improved since last visit.    Past Medical History:  Diagnosis Date  . Arthritis   . Asthma   . COVID   . COVID-19    07/24/20  . Diverticulitis   . GERD (gastroesophageal reflux disease)   . Hiatal hernia   . History of blood transfusion   . History of chicken pox   . History of GI bleed    2013/14  . Hyperlipidemia   . Hypertension     Past Surgical History:  Procedure Laterality Date  . BREAST BIOPSY    . BREAST SURGERY     bx dense breast nl per pt   . CESAREAN SECTION     04/27/84  . COLONOSCOPY    . ESOPHAGOGASTRODUODENOSCOPY (EGD) WITH PROPOFOL N/A 08/29/2020   Procedure: ESOPHAGOGASTRODUODENOSCOPY (EGD) WITH PROPOFOL;  Surgeon: [name], MD;  Location: [facility] ENDOSCOPY;  Service: Gastroenterology;  Laterality: N/A;  . ORIF ACETABULAR FRACTURE      Current  Medications: Current Meds  Medication Sig  . albuterol (VENTOLIN HFA) 108 (90 Base) MCG/ACT inhaler Inhale 1-2 puffs into the lungs every 6 (six) hours as needed.  . Ascorbic Acid (VITAMIN C PO) Take by mouth daily.  Marland Kitchen atorvastatin (LIPITOR) 10 MG tablet Take 1 tablet (10 mg total) by mouth daily at 6 PM.  . CALCIUM PO Take by mouth daily.  . [employer] SALINE NASAL SPRAY 0.65 % nasal spray PLACE 1 SPRAY INTO BOTH NOSTRILS AS NEEDED FOR CONGESTION.  Marland Kitchen diltiazem (CARDIZEM CD) 120 MG 24 hr capsule Take 1 capsule (120 mg total) by mouth daily.  . fluticasone (FLONASE) 50 MCG/ACT nasal spray Place into both nostrils daily.  . Fluticasone-Salmeterol (ADVAIR DISKUS) 100-50 MCG/DOSE AEPB Inhale 1 puff into the lungs in the morning and at bedtime. RINSE MOUTH.NOT GENERIC  . furosemide (LASIX) 20 MG tablet Take 0.5 tablets (10 mg total) by mouth daily as needed.  . montelukast (SINGULAIR) 10 MG tablet Take 1 tablet (10 mg total) by mouth at bedtime.  Marland Kitchen omeprazole (PRILOSEC) 40 MG capsule TAKE 1 CAPSULE BY MOUTH TWICE A DAY  . Specialty Vitamins Products (WOMENS VITA PAK PO) Take by mouth daily.  Marland Kitchen VITAMIN D PO Take by mouth daily.     Allergies:   Oxycodone   Social History   Socioeconomic History  .  Marital status: Married    Spouse name: Not on file  . Number of children: Not on file  . Years of education: Not on file  . Highest education level: Not on file  Occupational History  . Not on file  Tobacco Use  . Smoking status: Never Smoker  . Smokeless tobacco: Never Used  Vaping Use  . Vaping Use: Never used  Substance and Sexual Activity  . Alcohol use: Not Currently  . Drug use: Not Currently  . Sexual activity: Yes    Comment: husband   Other Topics Concern  . 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 There is no history of Breast cancer.  ROS:   Please see the history of present illness.     All other systems reviewed and are negative.  EKGs/Labs/Other Studies Reviewed:    The following studies were reviewed today:   EKG:  EKG not ordered today.    Recent Labs: 05/09/2020: ALT 14 07/04/2020: BUN 13; Creatinine, Ser 1.08; Hemoglobin 13.3; Platelets 265; Potassium 3.7; Sodium 140  Recent Lipid Panel    Component Value Date/Time   CHOL 157 05/09/2020 0938   TRIG 91.0 05/09/2020 0938   HDL 56.50 05/09/2020 0938   CHOLHDL 3 05/09/2020 0938   VLDL 18.2 05/09/2020 0938   LDLCALC 82 05/09/2020 0938     Risk Assessment/Calculations:      Physical Exam:    VS:  BP 120/70 (BP Location: Left Arm, Patient Position: Sitting, Cuff Size: Large)   Pulse 89   Ht 5\' 4"  (1.626 m)   Wt 227 lb 4 oz (103.1 kg)   SpO2 98%   BMI 39.01 kg/m     Wt Readings from Last 3 Encounters:  09/24/20 227 lb 4 oz (103.1 kg)  08/29/20 230 lb (104.3 kg)  08/20/20 232 lb (105.2 kg)     GEN:  Well nourished, well developed in no acute distress HEENT: Normal NECK: No JVD; No carotid bruits LYMPHATICS: No lymphadenopathy CARDIAC: RRR, no murmurs, rubs, gallops RESPIRATORY:  Clear to auscultation without rales, wheezing or rhonchi  ABDOMEN: Soft, non-tender, non-distended MUSCULOSKELETAL:  No edema; No deformity  SKIN: Warm and dry NEUROLOGIC:  Alert and oriented x 3 PSYCHIATRIC:  Normal affect   ASSESSMENT:    1. Palpitations   2. Syncope, unspecified syncope type   3. Pure hypercholesterolemia    PLAN:    In order of problems listed above:  1. Patient with  palpitations. 2 week monitor with occasional svt's. No other significant arrhythmias.  Patient made aware of results, reassured. 2. History of syncope,cardiac monitor with no significant arrhythmias, echo with normal systolic and diastolic function EF 60%. Patient made aware of results and reassured.  Etiology of syncope likely vasovagal. 3. Hyperlipidemia, continue statin.  Follow-up as needed    Medication Adjustments/Labs and Tests Ordered: Current medicines are reviewed at length with the patient today.  Concerns regarding medicines are outlined above.  No orders of the defined types were placed in this encounter.  No orders of the defined types were placed in this encounter.   Patient Instructions  Medication Instruction  Your physician recommends that you continue on your current medications as directed. Please refer to the Current Medication list given to you today.  *If you need a refill on your cardiac medications before your next appointment, please call your pharmacy*   Lab Work: None ordered If you have labs (blood work) drawn today and your tests are completely normal, you will receive your results only by: Marland Kitchen MyChart Message (if you have MyChart) OR . A paper copy in the mail If you have any lab test that is abnormal or we need to change your treatment, we will call you to review the results.   Testing/Procedures: None ordered   Follow-Up: At [facility], you and your health needs are our priority.  As part of our continuing mission to provide you with exceptional heart care, we have created designated Provider Care Teams.  These Care Teams include your primary Cardiologist (physician) and Advanced Practice Providers (APPs -  Physician Assistants and Nurse Practitioners) who all work together to provide you with the care you need, when you need it.  We recommend signing up for the patient portal called "MyChart".  Sign up information is provided on this After  Visit Summary.  MyChart is used to connect with patients for Virtual Visits (Telemedicine).  Patients are able to view lab/test results, encounter notes, upcoming appointments, etc.  Non-urgent messages can be sent to your provider as well.   To learn more about what you can do with MyChart, go to [email or web address].    Your next appointment:   Follow up as needed   The format for your next appointment:   In Person  Provider:   Debbe Odea, MD   Other Instructions      Signed, Debbe Odea, MD  09/24/2020 9:55 AM    [facility]

## 2020-09-24 NOTE — Patient Instructions (Signed)
Medication Instruction  Your physician recommends that you continue on your current medications as directed. Please refer to the Current Medication list given to you today.  *If you need a refill on your cardiac medications before your next appointment, please call your pharmacy*   Lab Work: None ordered If you have labs (blood work) drawn today and your tests are completely normal, you will receive your results only by: Marland Kitchen MyChart Message (if you have MyChart) OR . A paper copy in the mail If you have any lab test that is abnormal or we need to change your treatment, we will call you to review the results.   Testing/Procedures: None ordered   Follow-Up: At [facility], you and your health needs are our priority.  As part of our continuing mission to provide you with exceptional heart care, we have created designated Provider Care Teams.  These Care Teams include your primary Cardiologist (physician) and Advanced Practice Providers (APPs -  Physician Assistants and Nurse Practitioners) who all work together to provide you with the care you need, when you need it.  We recommend signing up for the patient portal called "MyChart".  Sign up information is provided on this After Visit Summary.  MyChart is used to connect with patients for Virtual Visits (Telemedicine).  Patients are able to view lab/test results, encounter notes, upcoming appointments, etc.  Non-urgent messages can be sent to your provider as well.   To learn more about what you can do with MyChart, go to [email or web address].    Your next appointment:   Follow up as needed   The format for your next appointment:   In Person  Provider:   Debbe Odea, MD   Other Instructions

## 2020-10-01 ENCOUNTER — Ambulatory Visit: Payer: No Typology Code available for payment source | Attending: Internal Medicine

## 2020-10-13 ENCOUNTER — Other Ambulatory Visit: Payer: Self-pay | Admitting: Internal Medicine

## 2020-10-13 DIAGNOSIS — J4520 Mild intermittent asthma, uncomplicated: Secondary | ICD-10-CM

## 2020-10-23 ENCOUNTER — Other Ambulatory Visit: Payer: Self-pay

## 2020-10-23 ENCOUNTER — Ambulatory Visit
Admission: RE | Admit: 2020-10-23 | Discharge: 2020-10-23 | Disposition: A | Discharge status: Home / Self Care | Payer: No Typology Code available for payment source | Source: Ambulatory Visit | Attending: Internal Medicine | Admitting: Internal Medicine

## 2020-10-23 DIAGNOSIS — R1084 Generalized abdominal pain: Secondary | ICD-10-CM | POA: Insufficient documentation

## 2020-10-24 DIAGNOSIS — K8020 Calculus of gallbladder without cholecystitis without obstruction: Secondary | ICD-10-CM | POA: Insufficient documentation

## 2020-10-24 NOTE — Addendum Note (Signed)
Addended by: Quentin Ore on: 10/24/2020 02:49 PM   Modules accepted: Orders

## 2020-10-24 NOTE — Addendum Note (Signed)
Addended by: Quentin Ore on: 10/24/2020 02:33 PM   Modules accepted: Orders

## 2020-10-27 ENCOUNTER — Other Ambulatory Visit: Payer: Self-pay | Admitting: Gastroenterology

## 2020-11-05 ENCOUNTER — Ambulatory Visit: Payer: No Typology Code available for payment source

## 2020-11-07 ENCOUNTER — Ambulatory Visit: Payer: No Typology Code available for payment source | Admitting: Internal Medicine

## 2020-11-19 ENCOUNTER — Ambulatory Visit: Payer: No Typology Code available for payment source | Admitting: Gastroenterology

## 2020-11-21 ENCOUNTER — Ambulatory Visit: Payer: No Typology Code available for payment source | Admitting: Internal Medicine

## 2020-11-26 ENCOUNTER — Ambulatory Visit: Payer: No Typology Code available for payment source | Admitting: Internal Medicine

## 2020-11-28 ENCOUNTER — Telehealth: Payer: Self-pay | Admitting: Internal Medicine

## 2020-11-28 ENCOUNTER — Other Ambulatory Visit: Payer: No Typology Code available for payment source

## 2020-11-28 NOTE — Telephone Encounter (Signed)
Rejection Reason - Patient Declined - pt cancelled appt via automated system during reminder call" Cheryl Coleman said on Nov 26, 2020 2:49 PM  msg from [facility] general surgery

## 2020-12-13 ENCOUNTER — Other Ambulatory Visit: Payer: Self-pay

## 2020-12-13 ENCOUNTER — Encounter: Payer: Self-pay | Admitting: Internal Medicine

## 2020-12-13 ENCOUNTER — Ambulatory Visit (INDEPENDENT_AMBULATORY_CARE_PROVIDER_SITE_OTHER): Payer: No Typology Code available for payment source | Admitting: Internal Medicine

## 2020-12-13 VITALS — BP 122/84 | HR 92 | Temp 98.1°F | Ht 64.0 in | Wt 228.2 lb

## 2020-12-13 DIAGNOSIS — K8020 Calculus of gallbladder without cholecystitis without obstruction: Secondary | ICD-10-CM | POA: Diagnosis not present

## 2020-12-13 DIAGNOSIS — I10 Essential (primary) hypertension: Secondary | ICD-10-CM | POA: Diagnosis not present

## 2020-12-13 DIAGNOSIS — R600 Localized edema: Secondary | ICD-10-CM

## 2020-12-13 DIAGNOSIS — R7303 Prediabetes: Secondary | ICD-10-CM

## 2020-12-13 DIAGNOSIS — Z1329 Encounter for screening for other suspected endocrine disorder: Secondary | ICD-10-CM | POA: Diagnosis not present

## 2020-12-13 DIAGNOSIS — J4520 Mild intermittent asthma, uncomplicated: Secondary | ICD-10-CM | POA: Diagnosis not present

## 2020-12-13 DIAGNOSIS — Z0000 Encounter for general adult medical examination without abnormal findings: Secondary | ICD-10-CM | POA: Diagnosis not present

## 2020-12-13 DIAGNOSIS — E559 Vitamin D deficiency, unspecified: Secondary | ICD-10-CM

## 2020-12-13 DIAGNOSIS — F419 Anxiety disorder, unspecified: Secondary | ICD-10-CM

## 2020-12-13 DIAGNOSIS — D72829 Elevated white blood cell count, unspecified: Secondary | ICD-10-CM | POA: Diagnosis not present

## 2020-12-13 DIAGNOSIS — E785 Hyperlipidemia, unspecified: Secondary | ICD-10-CM | POA: Diagnosis not present

## 2020-12-13 DIAGNOSIS — Z1231 Encounter for screening mammogram for malignant neoplasm of breast: Secondary | ICD-10-CM

## 2020-12-13 LAB — CBC WITH DIFFERENTIAL/PLATELET
Basophils Absolute: 0.1 K/uL (ref 0.0–0.1)
Basophils Relative: 0.5 % (ref 0.0–3.0)
Eosinophils Absolute: 0.3 K/uL (ref 0.0–0.7)
Eosinophils Relative: 3.1 % (ref 0.0–5.0)
HCT: 38.9 % (ref 36.0–46.0)
Hemoglobin: 13.4 g/dL (ref 12.0–15.0)
Lymphocytes Relative: 44.9 % (ref 12.0–46.0)
Lymphs Abs: 4.8 K/uL — ABNORMAL HIGH (ref 0.7–4.0)
MCHC: 34.5 g/dL (ref 30.0–36.0)
MCV: 79 fl (ref 78.0–100.0)
Monocytes Absolute: 0.6 K/uL (ref 0.1–1.0)
Monocytes Relative: 6 % (ref 3.0–12.0)
Neutro Abs: 4.9 K/uL (ref 1.4–7.7)
Neutrophils Relative %: 45.5 % (ref 43.0–77.0)
Platelets: 268 K/uL (ref 150.0–400.0)
RBC: 4.92 Mil/uL (ref 3.87–5.11)
RDW: 15.9 % — ABNORMAL HIGH (ref 11.5–15.5)
WBC: 10.7 K/uL — ABNORMAL HIGH (ref 4.0–10.5)

## 2020-12-13 LAB — LIPID PANEL
Cholesterol: 170 mg/dL (ref 0–200)
HDL: 62.2 mg/dL
LDL Cholesterol: 94 mg/dL (ref 0–99)
NonHDL: 107.45
Total CHOL/HDL Ratio: 3
Triglycerides: 69 mg/dL (ref 0.0–149.0)
VLDL: 13.8 mg/dL (ref 0.0–40.0)

## 2020-12-13 LAB — COMPREHENSIVE METABOLIC PANEL WITH GFR
ALT: 16 U/L (ref 0–35)
AST: 17 U/L (ref 0–37)
Albumin: 4.4 g/dL (ref 3.5–5.2)
Alkaline Phosphatase: 62 U/L (ref 39–117)
BUN: 13 mg/dL (ref 6–23)
CO2: 27 meq/L (ref 19–32)
Calcium: 9.6 mg/dL (ref 8.4–10.5)
Chloride: 103 meq/L (ref 96–112)
Creatinine, Ser: 0.94 mg/dL (ref 0.40–1.20)
GFR: 63.29 mL/min
Glucose, Bld: 99 mg/dL (ref 70–99)
Potassium: 3.8 meq/L (ref 3.5–5.1)
Sodium: 138 meq/L (ref 135–145)
Total Bilirubin: 0.4 mg/dL (ref 0.2–1.2)
Total Protein: 8.3 g/dL (ref 6.0–8.3)

## 2020-12-13 LAB — HEMOGLOBIN A1C: Hgb A1c MFr Bld: 5.9 % (ref 4.6–6.5)

## 2020-12-13 LAB — TSH: TSH: 2.29 u[IU]/mL (ref 0.35–4.50)

## 2020-12-13 LAB — VITAMIN D 25 HYDROXY (VIT D DEFICIENCY, FRACTURES): VITD: 50.7 ng/mL (ref 30.00–100.00)

## 2020-12-13 MED ORDER — ALPRAZOLAM 0.25 MG PO TABS: 0.1250 mg | ORAL_TABLET | Freq: Every day | ORAL | 5 refills | Status: DC | PRN

## 2020-12-13 MED ORDER — ATORVASTATIN CALCIUM 10 MG PO TABS: 10.0000 mg | ORAL_TABLET | Freq: Every day | ORAL | 3 refills | Status: DC

## 2020-12-13 MED ORDER — FLUTICASONE-SALMETEROL 100-50 MCG/ACT IN AEPB: 1.0000 | INHALATION_SPRAY | Freq: Two times a day (BID) | RESPIRATORY_TRACT | 11 refills | Status: DC

## 2020-12-13 MED ORDER — MONTELUKAST SODIUM 10 MG PO TABS: 10.0000 mg | ORAL_TABLET | Freq: Every day | ORAL | 3 refills | Status: DC

## 2020-12-13 MED ORDER — FUROSEMIDE 20 MG PO TABS: 10.0000 mg | ORAL_TABLET | Freq: Every day | ORAL | 5 refills | Status: DC | PRN

## 2020-12-13 MED ORDER — DILTIAZEM HCL ER COATED BEADS 120 MG PO CP24: 120.0000 mg | ORAL_CAPSULE | Freq: Every day | ORAL | 3 refills | Status: DC

## 2020-12-13 NOTE — Patient Instructions (Addendum)
Vitamin D 2000-4000 IU daily  Calcium carbonate [phone] mg daily  Consider pfizer booster x 2 doses  Consider prevnar vaccine Consider shingrix shingles vaccine  Consider Tdap vaccine    Cholelithiasis  Cholelithiasis is a disease in which gallstones form in the gallbladder. The gallbladder is an organ that stores bile. Bile is a fluid that helps to digest fats. Gallstones begin as small crystals and can slowly grow into stones. They may cause no symptoms until they block the gallbladder duct, or cystic duct, when the gallbladder tightens (contracts) after food is eaten. This can cause pain and is known as a gallbladder attack, or biliary colic. There are two main types of gallstones:  Cholesterol stones. These are the most common type of gallstone. These stones are made of hardened cholesterol and are usually yellow-green in color. Cholesterol is a fat-like substance that is made in the liver.  Pigment stones. These are dark in color and are made of a red-yellow substance, called bilirubin,that forms when hemoglobin from red blood cells breaks down. What are the causes? This condition may be caused by an imbalance in the different parts that make bile. This can happen if the bile:  Has too much bilirubin. This can happen in certain blood diseases, such as sickle cell anemia.  Has too much cholesterol.  Does not have enough bile salts. These salts help the body absorb and digest fats. In some cases, this condition can also be caused by the gallbladder not emptying completely or often enough. This is common during pregnancy. What increases the risk? The following factors may make you more likely to develop this condition:  Being female.  Having multiple pregnancies. Health care providers sometimes advise removing diseased gallbladders before future pregnancies.  Eating a diet that is heavy in fried foods, fat, and refined carbohydrates, such as white bread and white rice.  Being  obese.  Being older than age 60.  Using medicines that contain female hormones (estrogen) for a long time.  Losing weight quickly.  Having a family history of gallstones.  Having certain medical problems, such as: ? Diabetes mellitus. ? Cystic fibrosis. ? Crohn's disease. ? Cirrhosis or other long-term (chronic) liver disease. ? Certain blood diseases, such as sickle cell anemia or leukemia. What are the signs or symptoms? In many cases, having gallstones causes no symptoms. When you have gallstones but do not have symptoms, you have silent gallstones. If a gallstone blocks your bile duct, it can cause a gallbladder attack. The main symptom of a gallbladder attack is sudden pain in the upper right part of the abdomen. The pain:  Usually comes at night or after eating.  Can last for one hour or more.  Can spread to your right shoulder, back, or chest.  Can feel like indigestion. This is discomfort, burning, or fullness in your upper abdomen. If the bile duct is blocked for more than a few hours, it can cause an infection or inflammation of your gallbladder (cholecystitis), liver, or pancreas. This can cause:  Nausea or vomiting.  Bloating.  Pain in your abdomen that lasts for 5 hours or longer.  Tenderness in your upper abdomen, often in the upper right section and under your rib cage.  Fever or chills.  Skin or the white parts of your eyes turning yellow (jaundice). This usually happens when a stone has blocked bile from passing through the common bile duct.  Dark urine or light-colored stools. How is this diagnosed? This condition may be diagnosed  based on:  A physical exam.  Your medical history.  Ultrasound.  CT scan.  MRI. You may also have other tests, including:  Blood tests to check for signs of an infection or inflammation.  Cholescintigraphy, or HIDA scan. This is a scan of your gallbladder and bile ducts (biliary system) using non-harmful radioactive  material and special cameras that can see the radioactive material.  Endoscopic retrograde cholangiopancreatogram. This involves inserting a small tube with a camera on the end (endoscope) through your mouth to look at bile ducts and check for blockages. How is this treated? Treatment for this condition depends on the severity of the condition. Silent gallstones do not need treatment. Treatment may be needed if a blockage causes a gallbladder attack or other symptoms. Treatment may include:  Home care, if symptoms are not severe. ? During a simple gallbladder attack, stop eating and drinking for 12-24 hours (except for water and clear liquids). This helps to "cool down" your gallbladder. After 1 or 2 days, you can start to eat a diet of simple or clear foods, such as broths and crackers. ? You may also need medicines for pain or nausea or both. ? If you have cholecystitis and an infection, you will need antibiotics.  A hospital stay, if needed for pain control or for cholecystitis with severe infection.  Cholecystectomy, or surgery to remove your gallbladder. This is the most common treatment if all other treatments have not worked.  Medicines to break up gallstones. These are most effective at treating small gallstones. Medicines may be used for up to 6-12 months.  Endoscopic retrograde cholangiopancreatogram. A small basket can be attached to the endoscope and used to capture and remove gallstones, mainly those that are in the common bile duct. Follow these instructions at home: Medicines  Take over-the-counter and prescription medicines only as told by your health care provider.  If you were prescribed an antibiotic medicine, take it as told by your health care provider. Do not stop taking the antibiotic even if you start to feel better.  Ask your health care provider if the medicine prescribed to you requires you to avoid driving or using machinery. Eating and drinking  Drink enough  fluid to keep your urine pale yellow. This is important during a gallbladder attack. Water and clear liquids are preferred.  Follow a healthy diet. This includes: ? Reducing fatty foods, such as fried food and foods high in cholesterol. ? Reducing refined carbohydrates, such as white bread and white rice. ? Eating more fiber. Aim for foods such as almonds, fruit, and beans. Alcohol use  If you drink alcohol: ? Limit how much you use to:  0-1 drink a day for nonpregnant women.  0-2 drinks a day for men. ? Be aware of how much alcohol is in your drink. In the [place] one drink equals one 12 oz bottle of beer (355 mL), one 5 oz glass of wine (148 mL), or one 1 oz glass of hard liquor (44 mL). General instructions  Do not use any products that contain nicotine or tobacco, such as cigarettes, e-cigarettes, and chewing tobacco. If you need help quitting, ask your health care provider.  Maintain a healthy weight.  Keep all follow-up visits as told by your health care provider. These may include consultations with a surgeon or specialist. This is important. Where to find more information  [employer] of Diabetes and Digestive and Kidney Diseases: CarFlippers.tn Contact a health care provider if:  You think you  have had a gallbladder attack.  You have been diagnosed with silent gallstones and you develop pain in your abdomen or indigestion.  You begin to have attacks more often.  You have dark urine or light-colored stools. Get help right away if:  You have pain from a gallbladder attack that lasts for more than 2 hours.  You have pain in your abdomen that lasts for more than 5 hours or is getting worse.  You have a fever or chills.  You have nausea and vomiting that do not go away.  You develop jaundice. Summary  Cholelithiasis is a disease in which gallstones form in the gallbladder.  This condition may be caused by an imbalance in the different parts that make  bile. This can happen if your bile has too much bilirubin or cholesterol, or does not have enough bile salts.  Treatment for gallstones depends on the severity of the condition. Silent gallstones do not need treatment.  If gallstones cause a gallbladder attack or other symptoms, treatment usually involves not eating or drinking anything. Treatment may also include pain medicines and antibiotics, and it sometimes includes a hospital stay.  Surgery to remove the gallbladder is common if all other treatments have not worked. This information is not intended to replace advice given to you by your health care provider. Make sure you discuss any questions you have with your health care provider. Document Revised: 05/29/2019 Document Reviewed: 05/29/2019 Elsevier Patient Education  2021 [employer].
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
## 2020-12-23 NOTE — Addendum Note (Signed)
Addended by: Quentin Ore on: 12/23/2020 11:50 AM   Modules accepted: Orders

## 2021-01-21 ENCOUNTER — Encounter: Payer: Self-pay | Admitting: Family

## 2021-01-21 ENCOUNTER — Other Ambulatory Visit: Payer: Self-pay

## 2021-01-21 ENCOUNTER — Telehealth (INDEPENDENT_AMBULATORY_CARE_PROVIDER_SITE_OTHER): Payer: Medicare Other | Admitting: Family

## 2021-01-21 DIAGNOSIS — J069 Acute upper respiratory infection, unspecified: Secondary | ICD-10-CM | POA: Diagnosis not present

## 2021-01-21 MED ORDER — AZITHROMYCIN 250 MG PO TABS: ORAL_TABLET | ORAL | 0 refills | Status: DC

## 2021-01-22 NOTE — Progress Notes (Signed)
Virtual Visit via Video   I connected with patient on 01/22/21 at  4:00 PM EDT by a video enabled telemedicine application and verified that I am speaking with the correct person using two identifiers.  Location patient: Home Location provider: [facility], [facility] Persons participating in the virtual visit: Patient, Provider, CMA (Katie)  I discussed the limitations of evaluation and management by telemedicine and the availability of in person appointments. The patient expressed understanding and agreed to proceed.  Subjective:   HPI:   65 year old female presents BM video visit with complaints of hoarseness, sore throat, ear ache has been ongoing since Saturday patient reports that she has been taking Tylenol and Mucinex that has helped her symptoms.  However, she is concerned that she may need an antibiotic.  She took a rapid COVID test that was negative.  Denies any known sick contacts.  ROS:   See pertinent positives and negatives per HPI.  Patient Active Problem List   Diagnosis Date Noted   Gallstones 10/24/2020   Syncope 07/23/2020   Dysphagia 07/23/2020   URI with cough and congestion 05/31/2020   Acute non-recurrent sinusitis 05/31/2020   Osteopenia 05/09/2020   Annual physical exam 11/03/2019   Onychomycosis 11/03/2019   Asthma 08/29/2019   Tachycardia 08/29/2019   Hiatal hernia 06/06/2019   Anxiety 11/25/2018   Palpitations 10/19/2018   Vitamin D deficiency 10/06/2018   Prediabetes 10/06/2018   Essential hypertension 08/18/2018   Leg edema 08/18/2018   Gastroesophageal reflux disease 08/18/2018   Hyperlipidemia 08/18/2018   Mild intermittent asthma 08/18/2018    Social History   Tobacco Use   Smoking status: Never   Smokeless tobacco: Never  Substance Use Topics   Alcohol use: Not Currently    Current Outpatient Medications:    Acetaminophen (TYLENOL PO), Take by mouth., Disp: , Rfl:    albuterol (VENTOLIN HFA) 108 (90 Base) MCG/ACT  inhaler, INHALE 1-2 PUFFS BY MOUTH EVERY 6 (SIX) HOURS AS NEEDED, Disp: 18 each, Rfl: 11   ALPRAZolam (XANAX) 0.25 MG tablet, Take 0.5-1 tablets (0.125-0.25 mg total) by mouth daily as needed for anxiety., Disp: 30 tablet, Rfl: 5   Ascorbic Acid (VITAMIN C PO), Take by mouth daily., Disp: , Rfl:    atorvastatin (LIPITOR) 10 MG tablet, Take 1 tablet (10 mg total) by mouth daily at 6 PM., Disp: 90 tablet, Rfl: 3   azithromycin (ZITHROMAX) 250 MG tablet, 2 tabs today then 1 tab daily x 4 more days, Disp: 6 each, Rfl: 0   CALCIUM PO, Take by mouth daily., Disp: , Rfl:    diltiazem (CARDIZEM CD) 120 MG 24 hr capsule, Take 1 capsule (120 mg total) by mouth daily., Disp: 90 capsule, Rfl: 3   fluticasone (FLONASE) 50 MCG/ACT nasal spray, Place into both nostrils daily., Disp: , Rfl:    fluticasone-salmeterol (ADVAIR) 100-50 MCG/ACT AEPB, Inhale 1 puff into the lungs 2 (two) times daily. Rinse mouth, Disp: 60 each, Rfl: 11   furosemide (LASIX) 20 MG tablet, Take 0.5 tablets (10 mg total) by mouth daily as needed., Disp: 30 tablet, Rfl: 5   montelukast (SINGULAIR) 10 MG tablet, Take 1 tablet (10 mg total) by mouth at bedtime., Disp: 90 tablet, Rfl: 3   Specialty Vitamins Products (WOMENS VITA PAK PO), Take by mouth daily., Disp: , Rfl:    VITAMIN D PO, Take by mouth daily., Disp: , Rfl:    omeprazole (PRILOSEC) 40 MG capsule, TAKE 1 CAPSULE BY MOUTH TWICE A DAY (Patient not taking:  Reported on 01/21/2021), Disp: 180 capsule, Rfl: 0  Allergies  Allergen Reactions   Oxycodone Other (See Comments)    Feels bad    Objective:   There were no vitals taken for this visit.  Patient is well-developed, well-nourished in no acute distress.  Resting comfortably at home.  Head is normocephalic, atraumatic.  No labored breathing.  Speech is clear and coherent with logical content.  Patient is alert and oriented at baseline.    Assessment and Plan:  Marnesha was seen today for sore throat.  Diagnoses and all  orders for this visit:  Viral upper respiratory illness -     azithromycin (ZITHROMAX) 250 MG tablet; 2 tabs today then 1 tab daily x 4 more days    Plan: Advised patient that this is not likely to be a bacterial infection.  However, she is pretty adamant that she needs a Z-Pak.  Z-Pak provided today and explained that it is not likely to improve her symptoms.  I am encouraging Flonase 2 sprays in each nostril once a day and continuing the Mucinex.  Call the office with any questions or concerns.  Recheck as scheduled and sooner as needed.  Eulis Foster, FNP 01/22/2021

## 2021-02-06 ENCOUNTER — Telehealth: Payer: Medicare Other

## 2021-02-06 DIAGNOSIS — J4520 Mild intermittent asthma, uncomplicated: Secondary | ICD-10-CM

## 2021-02-06 NOTE — Telephone Encounter (Signed)
Pt states that the pharmacy [facility] in [facility] needs a new rx for the Advair because she has a new insurance. She needs something stating that she can only have the name brand of the medication. Please call pt or pharm

## 2021-02-07 MED ORDER — FLUTICASONE-SALMETEROL 100-50 MCG/ACT IN AEPB: 1.0000 | INHALATION_SPRAY | Freq: Two times a day (BID) | RESPIRATORY_TRACT | 11 refills | Status: DC

## 2021-02-07 NOTE — Telephone Encounter (Signed)
Sent in new RX for brand name only. Spoke with pharmacy and they state Patient's new insurance is not active until 02/17/21. They can fill this then.   Patient informed and verbalized understanding.

## 2021-03-05 DIAGNOSIS — R21 Rash and other nonspecific skin eruption: Secondary | ICD-10-CM | POA: Diagnosis not present

## 2021-03-06 ENCOUNTER — Other Ambulatory Visit: Payer: Self-pay | Admitting: Internal Medicine

## 2021-03-06 ENCOUNTER — Telehealth: Payer: Self-pay | Admitting: Internal Medicine

## 2021-03-06 DIAGNOSIS — J4520 Mild intermittent asthma, uncomplicated: Secondary | ICD-10-CM

## 2021-03-06 MED ORDER — ADVAIR HFA 115-21 MCG/ACT IN AERO: 2.0000 | INHALATION_SPRAY | Freq: Two times a day (BID) | RESPIRATORY_TRACT | 12 refills | Status: DC

## 2021-03-06 NOTE — Telephone Encounter (Signed)
Insurance will no longer cover advair 100/50 had to change to advair hfa 115/21 2 puffs 2x per day rinse mouth   Let us know if this does not work to control asthma can go up to the highest dose this is middle dose or they cover symbicort aer or advair diskus or breo

## 2021-03-11 DIAGNOSIS — K8020 Calculus of gallbladder without cholecystitis without obstruction: Secondary | ICD-10-CM | POA: Diagnosis not present

## 2021-03-11 NOTE — Telephone Encounter (Signed)
Left a message to call back.

## 2021-03-14 NOTE — Telephone Encounter (Signed)
Called and spoke to [facility]. Lashunta states that she has picked up the medication and has started taking it today. She states that she believes it will work better than the advair diskus. Cheryl Coleman had no further questions and states that she will call back if there are any issues.

## 2021-05-19 ENCOUNTER — Encounter: Payer: Self-pay | Admitting: Physician Assistant

## 2021-05-19 ENCOUNTER — Telehealth: Payer: Self-pay | Admitting: Internal Medicine

## 2021-05-19 DIAGNOSIS — U071 COVID-19: Secondary | ICD-10-CM | POA: Diagnosis not present

## 2021-05-19 NOTE — Telephone Encounter (Signed)
Noted  

## 2021-05-19 NOTE — Telephone Encounter (Signed)
Called Patient and she states that she is currently visiting in [place]. States she tried the [facility] visit but was informed that they could not treat her out of state. She was evaluated and recommend a Z-pak and antiviral medication but the orders were cancelled by provider when realized Patient was out if the state,   Patient states she will go to the Urgent Care in [facility] today.

## 2021-05-19 NOTE — Progress Notes (Signed)
Pt is currently in [place]. I advised that I am unable to treat her as she is not in a state that I am licensed in. Advised that she seek care at urgent care today.

## 2021-06-16 ENCOUNTER — Telehealth: Payer: Self-pay | Admitting: Internal Medicine

## 2021-06-16 NOTE — Telephone Encounter (Signed)
Patient is requesting a refill on her omeprazole (PRILOSEC) 40 MG capsule.

## 2021-06-17 MED ORDER — OMEPRAZOLE 40 MG PO CPDR: 40.0000 mg | DELAYED_RELEASE_CAPSULE | Freq: Two times a day (BID) | ORAL | 0 refills | Status: DC

## 2021-06-17 NOTE — Addendum Note (Signed)
Addended by: Elise Benne T on: 06/17/2021 10:17 AM   Modules accepted: Orders

## 2021-06-20 ENCOUNTER — Ambulatory Visit: Payer: No Typology Code available for payment source | Admitting: Internal Medicine

## 2021-07-08 ENCOUNTER — Encounter: Payer: Self-pay | Admitting: Internal Medicine

## 2021-07-08 ENCOUNTER — Other Ambulatory Visit: Payer: Self-pay

## 2021-07-08 ENCOUNTER — Ambulatory Visit (INDEPENDENT_AMBULATORY_CARE_PROVIDER_SITE_OTHER): Payer: Medicare HMO | Admitting: Internal Medicine

## 2021-07-08 VITALS — BP 124/82 | HR 95 | Temp 97.0°F | Ht 64.0 in | Wt 221.6 lb

## 2021-07-08 DIAGNOSIS — I10 Essential (primary) hypertension: Secondary | ICD-10-CM | POA: Diagnosis not present

## 2021-07-08 DIAGNOSIS — D72820 Lymphocytosis (symptomatic): Secondary | ICD-10-CM | POA: Diagnosis not present

## 2021-07-08 DIAGNOSIS — R7303 Prediabetes: Secondary | ICD-10-CM | POA: Diagnosis not present

## 2021-07-08 DIAGNOSIS — K8020 Calculus of gallbladder without cholecystitis without obstruction: Secondary | ICD-10-CM

## 2021-07-08 DIAGNOSIS — F419 Anxiety disorder, unspecified: Secondary | ICD-10-CM | POA: Diagnosis not present

## 2021-07-08 DIAGNOSIS — D72829 Elevated white blood cell count, unspecified: Secondary | ICD-10-CM | POA: Diagnosis not present

## 2021-07-08 DIAGNOSIS — E785 Hyperlipidemia, unspecified: Secondary | ICD-10-CM | POA: Diagnosis not present

## 2021-07-08 DIAGNOSIS — R69 Illness, unspecified: Secondary | ICD-10-CM | POA: Diagnosis not present

## 2021-07-08 DIAGNOSIS — D751 Secondary polycythemia: Secondary | ICD-10-CM | POA: Diagnosis not present

## 2021-07-08 LAB — CBC WITH DIFFERENTIAL/PLATELET
Basophils Absolute: 0 K/uL (ref 0.0–0.1)
Basophils Relative: 0.5 % (ref 0.0–3.0)
Eosinophils Absolute: 0.2 K/uL (ref 0.0–0.7)
Eosinophils Relative: 2.5 % (ref 0.0–5.0)
HCT: 39.2 % (ref 36.0–46.0)
Hemoglobin: 13.1 g/dL (ref 12.0–15.0)
Lymphocytes Relative: 46.8 % — ABNORMAL HIGH (ref 12.0–46.0)
Lymphs Abs: 4.3 K/uL — ABNORMAL HIGH (ref 0.7–4.0)
MCHC: 33.5 g/dL (ref 30.0–36.0)
MCV: 80.5 fl (ref 78.0–100.0)
Monocytes Absolute: 0.6 K/uL (ref 0.1–1.0)
Monocytes Relative: 6.7 % (ref 3.0–12.0)
Neutro Abs: 4 K/uL (ref 1.4–7.7)
Neutrophils Relative %: 43.5 % (ref 43.0–77.0)
Platelets: 247 K/uL (ref 150.0–400.0)
RBC: 4.87 Mil/uL (ref 3.87–5.11)
RDW: 16.5 % — ABNORMAL HIGH (ref 11.5–15.5)
WBC: 9.2 K/uL (ref 4.0–10.5)

## 2021-07-08 LAB — LIPID PANEL
Cholesterol: 173 mg/dL (ref 0–200)
HDL: 63.3 mg/dL
LDL Cholesterol: 98 mg/dL (ref 0–99)
NonHDL: 110.16
Total CHOL/HDL Ratio: 3
Triglycerides: 62 mg/dL (ref 0.0–149.0)
VLDL: 12.4 mg/dL (ref 0.0–40.0)

## 2021-07-08 LAB — COMPREHENSIVE METABOLIC PANEL WITH GFR
ALT: 12 U/L (ref 0–35)
AST: 15 U/L (ref 0–37)
Albumin: 4.1 g/dL (ref 3.5–5.2)
Alkaline Phosphatase: 57 U/L (ref 39–117)
BUN: 13 mg/dL (ref 6–23)
CO2: 30 meq/L (ref 19–32)
Calcium: 9.4 mg/dL (ref 8.4–10.5)
Chloride: 103 meq/L (ref 96–112)
Creatinine, Ser: 0.86 mg/dL (ref 0.40–1.20)
GFR: 70.13 mL/min
Glucose, Bld: 81 mg/dL (ref 70–99)
Potassium: 4 meq/L (ref 3.5–5.1)
Sodium: 138 meq/L (ref 135–145)
Total Bilirubin: 0.5 mg/dL (ref 0.2–1.2)
Total Protein: 7.9 g/dL (ref 6.0–8.3)

## 2021-07-08 LAB — HEMOGLOBIN A1C: Hgb A1c MFr Bld: 6 % (ref 4.6–6.5)

## 2021-07-08 MED ORDER — ALPRAZOLAM 0.25 MG PO TABS: 0.1250 mg | ORAL_TABLET | Freq: Every day | ORAL | 5 refills | Status: DC | PRN

## 2021-07-08 NOTE — Patient Instructions (Addendum)
[place]  [facility]  [facility]  [place]  [place]  [place]     4th shot covid early 08/2021

## 2021-07-08 NOTE — Progress Notes (Signed)
Chief Complaint  Patient presents with   Follow-up   F/u  1. Htn controlled on dilt 120 cd  2. Anxiety needs xanax refill  3. Asthma stopped advair does not like diskus or HFA inhaler uses prn albuterol only    Review of Systems  Constitutional:  Negative for weight loss.  HENT:  Negative for hearing loss.   Eyes:  Negative for blurred vision.  Respiratory:  Negative for shortness of breath.   Cardiovascular:  Negative for chest pain.  Gastrointestinal:  Negative for abdominal pain and blood in stool.  Genitourinary:  Negative for dysuria.  Musculoskeletal:  Negative for falls and joint pain.  Skin:  Negative for rash.  Neurological:  Negative for headaches.  Psychiatric/Behavioral:  Negative for depression.   Past Medical History:  Diagnosis Date   Arthritis    Asthma    COVID    COVID-19    07/24/20, 05/19/21   Diverticulitis    GERD (gastroesophageal reflux disease)    Hiatal hernia    History of blood transfusion    History of chicken pox    History of GI bleed    2013/14   Hyperlipidemia    Hypertension    Past Surgical History:  Procedure Laterality Date   BREAST BIOPSY     BREAST SURGERY     bx dense breast nl per pt    CESAREAN SECTION     04/27/84   COLONOSCOPY     ESOPHAGOGASTRODUODENOSCOPY (EGD) WITH PROPOFOL N/A 08/29/2020   Procedure: ESOPHAGOGASTRODUODENOSCOPY (EGD) WITH PROPOFOL;  Surgeon: [name], MD;  Location: [facility] ENDOSCOPY;  Service: Gastroenterology;  Laterality: N/A;   ORIF ACETABULAR FRACTURE     Family History  Problem Relation Age of Onset   Hypertension Maternal Aunt    Diabetes Maternal Aunt    Breast cancer Neg Hx    Social History   Socioeconomic History   Marital status: Married    Spouse name: Not on file   Number of children: Not on file   Years of education: Not on file   Highest education level: Not on file  Occupational History   Not on file  Tobacco Use   Smoking status: Never   Smokeless tobacco: Never   Vaping Use   Vaping Use: Never used  Substance and Sexual Activity   Alcohol use: Not Currently   Drug use: Not Currently   Sexual activity: Yes    Comment: husband   Other Topics Concern   Not on file  Social History Narrative   Lived in [place] from [place]    Married 1st husband died in late 90s/2000s   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   albuterol (VENTOLIN HFA) 108 (90 Base) MCG/ACT inhaler INHALE 1-2 PUFFS BY MOUTH EVERY 6 (SIX) HOURS AS NEEDED   Ascorbic Acid (VITAMIN C PO) Take by mouth daily.   atorvastatin (LIPITOR) 10 MG tablet Take 1 tablet (10 mg total) by mouth daily at 6 PM.   CALCIUM PO Take by mouth daily.   diltiazem (  CARDIZEM CD) 120 MG 24 hr capsule Take 1 capsule (120 mg total) by mouth daily.   fluticasone (FLONASE) 50 MCG/ACT nasal spray Place into both nostrils daily.   furosemide (LASIX) 20 MG tablet Take 0.5 tablets (10 mg total) by mouth daily as needed.   montelukast (SINGULAIR) 10 MG tablet Take 1 tablet (10 mg total) by mouth at bedtime.   omeprazole (PRILOSEC) 40 MG capsule Take 1 capsule (40 mg total) by mouth 2 (two) times daily.   Specialty Vitamins Products (WOMENS VITA PAK PO) Take by mouth daily.   VITAMIN D PO Take by mouth daily.   [DISCONTINUED] ALPRAZolam (XANAX) 0.25 MG tablet Take 0.5-1 tablets (0.125-0.25 mg total) by mouth daily as needed for anxiety.   Allergies  Allergen Reactions   Oxycodone Other (See Comments)    Feels bad   No results found for this or any previous visit (from the past 2160 hour(s)). Objective  Body mass index is 38.04 kg/m. Wt  Readings from Last 3 Encounters:  07/08/21 221 lb 9.6 oz (100.5 kg)  12/13/20 228 lb 3.2 oz (103.5 kg)  09/24/20 227 lb 4 oz (103.1 kg)   Temp Readings from Last 3 Encounters:  07/08/21 (!) 97 F (36.1 C) (Temporal)  12/13/20 98.1 F (36.7 C) (Oral)  08/29/20 (!) 96.2 F (35.7 C) (Temporal)   BP Readings from Last 3 Encounters:  07/08/21 124/82  12/13/20 122/84  09/24/20 120/70   Pulse Readings from Last 3 Encounters:  07/08/21 95  12/13/20 92  09/24/20 89    Physical Exam Vitals and nursing note reviewed.  Constitutional:      Appearance: Normal appearance. She is well-developed and well-groomed.  HENT:     Head: Normocephalic and atraumatic.  Eyes:     Conjunctiva/sclera: Conjunctivae normal.     Pupils: Pupils are equal, round, and reactive to light.  Cardiovascular:     Rate and Rhythm: Normal rate and regular rhythm.     Heart sounds: Normal heart sounds. No murmur heard. Pulmonary:     Effort: Pulmonary effort is normal.     Breath sounds: Normal breath sounds.  Abdominal:     General: Abdomen is flat. Bowel sounds are normal.     Tenderness: There is no abdominal tenderness.  Musculoskeletal:        General: No tenderness.  Skin:    General: Skin is warm and dry.  Neurological:     General: No focal deficit present.     Mental Status: She is alert and oriented to person, place, and time. Mental status is at baseline.     Cranial Nerves: Cranial nerves 2-12 are intact.     Gait: Gait is intact.  Psychiatric:        Attention and Perception: Attention and perception normal.        Mood and Affect: Mood and affect normal.        Speech: Speech normal.        Behavior: Behavior normal. Behavior is cooperative.        Thought Content: Thought content normal.        Cognition and Memory: Cognition and memory normal.        Judgment: Judgment normal.    Assessment  Plan  Essential hypertension Dilt 120 cd  Contolled   Gallstones - Plan:est surgery  Dr. Tommi Rumps    Leukocytosis, unspecified type - Plan: CBC with Differential/Platelet, Pathologist smear review  Prediabetes - Plan: Hemoglobin A1c  Hyperlipidemia, unspecified hyperlipidemia  type - Plan: Comprehensive metabolic panel, Lipid panel  Anxiety - Plan: ALPRAZolam (XANAX) 0.25  1/2 to 1 MG tablet   HM Flu shot utd [place]  pna had 2019 [place]  -consider prevnar and pna 23 get records D.r Tawny [place] Consider shingrix in future  Pfizer covid 3/3 rec 4th   04/02/20 mammo normal ordered sch 08/20/21    Colonoscopy get records [place] obtained:  colonoscopy 10/15/06 mod to severe diverticulosis IH f/u in 5 years  EGD 11/29/07 candida, mild gastritis  EGD 01/03/13/colonoscopy diverticulosis ext and int hemorrhoids TI with ulceration neg H pylori UGD 02/01/18 gastritis ? If had colonoscopy  GI established [place] f/u 08/20/20   Pap 10/2019 normal neg neg hpv   DEXA age 8 osteopenia calcium + vit D repeat 3-5 years had 04/02/20   Provider: Dr. French Ana [name]-Internal Medicine

## 2021-07-09 LAB — URINALYSIS, ROUTINE W REFLEX MICROSCOPIC
Bacteria, UA: NONE SEEN /HPF
Bilirubin Urine: NEGATIVE
Glucose, UA: NEGATIVE
Hgb urine dipstick: NEGATIVE
Hyaline Cast: NONE SEEN /LPF
Ketones, ur: NEGATIVE
Leukocytes,Ua: NEGATIVE
Nitrite: NEGATIVE
RBC / HPF: NONE SEEN /HPF (ref 0–2)
Specific Gravity, Urine: 1.012 (ref 1.001–1.035)
Squamous Epithelial / HPF: NONE SEEN /HPF
WBC, UA: NONE SEEN /HPF (ref 0–5)
pH: 5.5 (ref 5.0–8.0)

## 2021-07-09 LAB — MICROSCOPIC MESSAGE

## 2021-07-09 LAB — PATHOLOGIST SMEAR REVIEW

## 2021-07-11 ENCOUNTER — Telehealth: Payer: Self-pay | Admitting: Internal Medicine

## 2021-07-11 ENCOUNTER — Other Ambulatory Visit: Payer: Self-pay | Admitting: Internal Medicine

## 2021-07-11 DIAGNOSIS — L03031 Cellulitis of right toe: Secondary | ICD-10-CM

## 2021-07-11 DIAGNOSIS — B351 Tinea unguium: Secondary | ICD-10-CM

## 2021-07-11 MED ORDER — CICLOPIROX 8 % EX SOLN: Freq: Every day | CUTANEOUS | 5 refills | Status: DC

## 2021-07-11 NOTE — Addendum Note (Signed)
Addended by: Quentin Ore on: 07/11/2021 09:51 AM   Modules accepted: Orders

## 2021-07-11 NOTE — Telephone Encounter (Signed)
Patient states Dr French Ana [name] had prescribed an oil for fungus and discoloration in her toe nail. States she is having issues with the big toe on the right foot again.   Patient asking for medication to be sent in again.

## 2021-07-22 ENCOUNTER — Encounter: Payer: Self-pay | Admitting: *Deleted

## 2021-07-25 ENCOUNTER — Encounter: Payer: Self-pay | Admitting: Oncology

## 2021-07-25 ENCOUNTER — Inpatient Hospital Stay: Payer: Medicare HMO | Attending: Oncology | Admitting: Oncology

## 2021-07-25 ENCOUNTER — Inpatient Hospital Stay: Payer: Medicare HMO

## 2021-07-25 ENCOUNTER — Other Ambulatory Visit: Payer: Self-pay

## 2021-07-25 VITALS — BP 120/68 | HR 96 | Temp 98.6°F | Resp 16 | Wt 225.4 lb

## 2021-07-25 DIAGNOSIS — D72820 Lymphocytosis (symptomatic): Secondary | ICD-10-CM

## 2021-07-25 DIAGNOSIS — E785 Hyperlipidemia, unspecified: Secondary | ICD-10-CM | POA: Diagnosis not present

## 2021-07-25 DIAGNOSIS — J45909 Unspecified asthma, uncomplicated: Secondary | ICD-10-CM | POA: Diagnosis not present

## 2021-07-25 DIAGNOSIS — I10 Essential (primary) hypertension: Secondary | ICD-10-CM | POA: Insufficient documentation

## 2021-07-25 DIAGNOSIS — D721 Eosinophilia, unspecified: Secondary | ICD-10-CM | POA: Insufficient documentation

## 2021-07-25 DIAGNOSIS — K219 Gastro-esophageal reflux disease without esophagitis: Secondary | ICD-10-CM | POA: Insufficient documentation

## 2021-07-25 DIAGNOSIS — Z7952 Long term (current) use of systemic steroids: Secondary | ICD-10-CM | POA: Insufficient documentation

## 2021-07-25 DIAGNOSIS — Z79899 Other long term (current) drug therapy: Secondary | ICD-10-CM | POA: Insufficient documentation

## 2021-07-25 LAB — CBC WITH DIFFERENTIAL/PLATELET
Abs Immature Granulocytes: 0.01 K/uL (ref 0.00–0.07)
Basophils Absolute: 0.1 K/uL (ref 0.0–0.1)
Basophils Relative: 1 %
Eosinophils Absolute: 0.5 K/uL (ref 0.0–0.5)
Eosinophils Relative: 5 %
HCT: 36.9 % (ref 36.0–46.0)
Hemoglobin: 13.3 g/dL (ref 12.0–15.0)
Immature Granulocytes: 0 %
Lymphocytes Relative: 41 %
Lymphs Abs: 4.1 K/uL — ABNORMAL HIGH (ref 0.7–4.0)
MCH: 27.9 pg (ref 26.0–34.0)
MCHC: 36 g/dL (ref 30.0–36.0)
MCV: 77.5 fL — ABNORMAL LOW (ref 80.0–100.0)
Monocytes Absolute: 0.8 K/uL (ref 0.1–1.0)
Monocytes Relative: 8 %
Neutro Abs: 4.6 K/uL (ref 1.7–7.7)
Neutrophils Relative %: 45 %
Platelets: 255 K/uL (ref 150–400)
RBC: 4.76 MIL/uL (ref 3.87–5.11)
RDW: 15.6 % — ABNORMAL HIGH (ref 11.5–15.5)
WBC: 10 K/uL (ref 4.0–10.5)
nRBC: 0 % (ref 0.0–0.2)

## 2021-07-25 NOTE — Progress Notes (Signed)
Patient here for initial visit she denies fatigue, dyspnea,

## 2021-07-27 NOTE — Progress Notes (Signed)
Hematology/Oncology Consult note [facility] Telephone:([phone] [phone]  Patient Care Team: [name], Nino Glow, MD as PCP - General (Internal Medicine) Kate Sable, MD as PCP - Cardiology (Cardiology) Sindy Guadeloupe, MD as Consulting Physician (Hematology and Oncology) Lin Landsman, MD as Consulting Physician (Gastroenterology)   Name of the patient: Cheryl Coleman  [id number]  11/26/1955    Reason for referral- leucocytosis   Referring physician-Dr. [name] Date of visit: 07/27/21   History of presenting illness-  Patient is a 66 year old female with a past medical history significant for GERD hiatal hernia hypertension hyperlipidemia who has been referred to [place] for leukocytosis.  Looking back at her prior CBCs her white cell count typically fluctuates between 9.5-11.5.  More recently on 07/08/2021 her white count was 9.2 with an H&H of 13.1/29.2 and a platelet count of 247.  Her hemoglobin and platelet counts have always been normal.  Her differential has shown relative lymphocytosis with an absolute lymphocyte count that ranges between 4000-4800.  Patient overall reports a normal appetite.  Denies any unintentional weight loss or drenching night sweats.  Denies any lumps or bumps anywhere.  ECOG PS- 0  Pain scale- 0   Review of systems- Review of Systems  Constitutional:  Negative for chills, fever, malaise/fatigue and weight loss.  HENT:  Negative for congestion, ear discharge and nosebleeds.   Eyes:  Negative for blurred vision.  Respiratory:  Negative for cough, hemoptysis, sputum production, shortness of breath and wheezing.   Cardiovascular:  Negative for chest pain, palpitations, orthopnea and claudication.  Gastrointestinal:  Negative for abdominal pain, blood in stool, constipation, diarrhea, heartburn, melena, nausea and vomiting.  Genitourinary:  Negative for dysuria, flank pain, frequency, hematuria and  urgency.  Musculoskeletal:  Negative for back pain, joint pain and myalgias.  Skin:  Negative for rash.  Neurological:  Negative for dizziness, tingling, focal weakness, seizures, weakness and headaches.  Endo/Heme/Allergies:  Does not bruise/bleed easily.  Psychiatric/Behavioral:  Negative for depression and suicidal ideas. The patient does not have insomnia.    Allergies  Allergen Reactions   Oxycodone Other (See Comments)    Feels bad    Patient Active Problem List   Diagnosis Date Noted   Gallstones 10/24/2020   Syncope 07/23/2020   Dysphagia 07/23/2020   URI with cough and congestion 05/31/2020   Acute non-recurrent sinusitis 05/31/2020   Osteopenia 05/09/2020   Annual physical exam 11/03/2019   Onychomycosis 11/03/2019   Asthma 08/29/2019   Tachycardia 08/29/2019   Hiatal hernia 06/06/2019   Anxiety 11/25/2018   Palpitations 10/19/2018   Vitamin D deficiency 10/06/2018   Prediabetes 10/06/2018   Essential hypertension 08/18/2018   Leg edema 08/18/2018   Gastroesophageal reflux disease 08/18/2018   Hyperlipidemia 08/18/2018   Mild intermittent asthma 08/18/2018     Past Medical History:  Diagnosis Date   Arthritis    Asthma    COVID    COVID-19    07/24/20, 05/19/21   Diverticulitis    GERD (gastroesophageal reflux disease)    Hiatal hernia    History of blood transfusion    History of chicken pox    History of GI bleed    2013/14   Hyperlipidemia    Hypertension    Leukocytosis      Past Surgical History:  Procedure Laterality Date   BREAST BIOPSY     BREAST SURGERY     bx dense breast nl per pt    CESAREAN SECTION  04/27/84   COLONOSCOPY     ESOPHAGOGASTRODUODENOSCOPY (EGD) WITH PROPOFOL N/A 08/29/2020   Procedure: ESOPHAGOGASTRODUODENOSCOPY (EGD) WITH PROPOFOL;  Surgeon: Lin Landsman, MD;  Location: [place];  Service: Gastroenterology;  Laterality: N/A;   ORIF ACETABULAR FRACTURE      Social History   Socioeconomic History    Marital status: Married    Spouse name: Not on file   Number of children: Not on file   Years of education: Not on file   Highest education level: Not on file  Occupational History   Not on file  Tobacco Use   Smoking status: Never   Smokeless tobacco: Never  Vaping Use   Vaping Use: Never used  Substance and Sexual Activity   Alcohol use: Not Currently   Drug use: Not Currently   Sexual activity: Yes    Comment: husband   Other Topics Concern   Not on file  Social History Narrative   Lived in [place] from [place]    Married 1st husband died in late 62s/2000s   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, wears seat belt, safe in relationship    Social Determinants of Health   Financial Resource Strain: Not on file  Food Insecurity: Not on file  Transportation Needs: Not on file  Physical Activity: Not on file  Stress: Not on file  Social Connections: Not on file  Intimate Partner Violence: Not on file     Family History  Problem Relation Age of Onset   Hypertension Maternal Aunt    Diabetes Maternal Aunt    Breast cancer Neg Hx      Current Outpatient Medications:    Acetaminophen (TYLENOL PO), Take by mouth., Disp: , Rfl:    albuterol (VENTOLIN HFA) 108 (90 Base) MCG/ACT inhaler, INHALE 1-2 PUFFS BY MOUTH EVERY 6 (SIX) HOURS AS NEEDED, Disp: 18 each, Rfl: 11   ALPRAZolam (XANAX) 0.25 MG tablet, Take 0.5-1 tablets (0.125-0.25 mg total) by mouth daily as needed for anxiety., Disp: 30 tablet, Rfl: 5   Ascorbic Acid (VITAMIN C PO), Take by mouth daily., Disp: , Rfl:    atorvastatin (LIPITOR) 10 MG tablet, Take 1 tablet (10 mg total) by mouth daily at 6 PM., Disp: 90 tablet, Rfl: 3   CALCIUM PO, Take by mouth daily., Disp: , Rfl:    diltiazem (CARDIZEM CD) 120 MG 24 hr capsule, Take 1 capsule (120 mg total) by mouth daily., Disp: 90 capsule, Rfl: 3   fluticasone (FLONASE) 50 MCG/ACT nasal spray, Place into both nostrils daily., Disp: , Rfl:    furosemide (LASIX) 20 MG tablet, Take  0.5 tablets (10 mg total) by mouth daily as needed., Disp: 30 tablet, Rfl: 5   montelukast (SINGULAIR) 10 MG tablet, Take 1 tablet (10 mg total) by mouth at bedtime., Disp: 90 tablet, Rfl: 3   omeprazole (PRILOSEC) 40 MG capsule, Take 1 capsule (40 mg total) by mouth 2 (two) times daily., Disp: 180 capsule, Rfl: 0   Specialty Vitamins Products (WOMENS VITA PAK PO), Take by mouth daily., Disp: , Rfl:    VITAMIN D PO, Take by mouth daily., Disp: , Rfl:    ciclopirox (PENLAC) 8 % solution, Apply topically at bedtime. Apply over nail and surrounding skin. Apply daily over previous coat. After seven (7) days, may remove with alcohol and continue cycle use up to 48 weeks (Patient not taking: Reported on 07/25/2021), Disp: 6.6 mL, Rfl: 5   Physical exam:  Vitals:  07/25/21 1046  BP: 120/68  Pulse: 96  Resp: 16  Temp: 98.6 F (37 C)  TempSrc: Tympanic  SpO2: 100%  Weight: 225 lb 6.4 oz (102.2 kg)   Physical Exam Constitutional:      General: She is not in acute distress. Cardiovascular:     Rate and Rhythm: Normal rate and regular rhythm.     Heart sounds: Normal heart sounds.  Pulmonary:     Effort: Pulmonary effort is normal.     Breath sounds: Normal breath sounds.  Abdominal:     General: Bowel sounds are normal.     Palpations: Abdomen is soft.     Comments: No palpable hepatosplenomegaly  Lymphadenopathy:     Comments: No palpable cervical, supraclavicular, axillary or inguinal adenopathy    Skin:    General: Skin is warm and dry.  Neurological:     Mental Status: She is alert and oriented to person, place, and time.       CMP Latest Ref Rng & Units 07/08/2021  Glucose 70 - 99 mg/dL 81  BUN 6 - 23 mg/dL 13  Creatinine 0.40 - 1.20 mg/dL 0.86  Sodium 135 - 145 mEq/L 138  Potassium 3.5 - 5.1 mEq/L 4.0  Chloride 96 - 112 mEq/L 103  CO2 19 - 32 mEq/L 30  Calcium 8.4 - 10.5 mg/dL 9.4  Total Protein 6.0 - 8.3 g/dL 7.9  Total Bilirubin 0.2 - 1.2 mg/dL 0.5  Alkaline Phos  39 - 117 U/L 57  AST 0 - 37 U/L 15  ALT 0 - 35 U/L 12   CBC Latest Ref Rng & Units 07/25/2021  WBC 4.0 - 10.5 K/uL 10.0  Hemoglobin 12.0 - 15.0 g/dL 13.3  Hematocrit 36.0 - 46.0 % 36.9  Platelets 150 - 400 K/uL 255   Assessment and plan- Patient is a 66 y.o. female referred for leukocytosis/lymphocytosis  Patient has had mild leukocytosis/lymphocytosis for the last 3 to 4 years with an absolute lymphocyte count that ranges between 4000-4800.  No associated B symptoms anemia or thrombocytopenia.  I will obtain a repeat CBC with differential along with the flow cytometry at this time to see if there is a clonal lymphocytosis going on that would be suggestive of CLL.  I will see her back in 2 weeks to discuss the results of blood work for a video visit   Thank you for this kind referral and the opportunity to participate in the care of this patient   Visit Diagnosis 1. Lymphocytosis     Dr. Randa Evens, MD, MPH [facility] at [facility] [id number] 07/27/2021

## 2021-07-28 LAB — COMP PANEL: LEUKEMIA/LYMPHOMA

## 2021-07-29 ENCOUNTER — Telehealth (INDEPENDENT_AMBULATORY_CARE_PROVIDER_SITE_OTHER): Payer: Medicare HMO | Admitting: Internal Medicine

## 2021-07-29 ENCOUNTER — Other Ambulatory Visit: Payer: Self-pay

## 2021-07-29 ENCOUNTER — Telehealth: Payer: Medicare HMO | Admitting: Internal Medicine

## 2021-07-29 ENCOUNTER — Encounter: Payer: Self-pay | Admitting: Internal Medicine

## 2021-07-29 ENCOUNTER — Telehealth: Payer: Self-pay | Admitting: Internal Medicine

## 2021-07-29 VITALS — Ht 64.0 in | Wt 225.0 lb

## 2021-07-29 DIAGNOSIS — J0110 Acute frontal sinusitis, unspecified: Secondary | ICD-10-CM | POA: Diagnosis not present

## 2021-07-29 DIAGNOSIS — R519 Headache, unspecified: Secondary | ICD-10-CM | POA: Diagnosis not present

## 2021-07-29 DIAGNOSIS — R0989 Other specified symptoms and signs involving the circulatory and respiratory systems: Secondary | ICD-10-CM | POA: Diagnosis not present

## 2021-07-29 DIAGNOSIS — R0981 Nasal congestion: Secondary | ICD-10-CM | POA: Diagnosis not present

## 2021-07-29 DIAGNOSIS — J029 Acute pharyngitis, unspecified: Secondary | ICD-10-CM | POA: Diagnosis not present

## 2021-07-29 DIAGNOSIS — J4521 Mild intermittent asthma with (acute) exacerbation: Secondary | ICD-10-CM

## 2021-07-29 LAB — POC INFLUENZA A&B (BINAX/QUICKVUE)
Influenza A, POC: NEGATIVE
Influenza B, POC: NEGATIVE

## 2021-07-29 LAB — POCT RAPID STREP A (OFFICE): Rapid Strep A Screen: NEGATIVE

## 2021-07-29 MED ORDER — AZITHROMYCIN 250 MG PO TABS: ORAL_TABLET | ORAL | 0 refills | Status: AC

## 2021-07-29 MED ORDER — PREDNISONE 20 MG PO TABS: 20.0000 mg | ORAL_TABLET | Freq: Every day | ORAL | 0 refills | Status: DC

## 2021-07-29 NOTE — Progress Notes (Signed)
Virtual Visit via Video Note  I connected with Cheryl Coleman  on 07/29/21 at  1:130 PM EST by a video enabled telemedicine application and verified that I am speaking with the correct person using two identifiers.  Location patient: [name] Location provider:work or home office Persons participating in the virtual visit: patient, provider  I discussed the limitations and requested verbal permission for telemedicine visit. The patient expressed understanding and agreed to proceed.   HPI:  Acute telemedicine visit for : Sick visit cough last Tuesday, sore throat like knife cutting, chills, sneezing like water in R>L ear covid home test neg tried mucinex, flonase had sinua pain and pressure and pain and nasal congestion and ear pain and pnd+ and wheezing  Allergies  Allergen Reactions   Oxycodone Other (See Comments)    Feels bad   -COVID-19 vaccine status:  Immunization History  Administered Date(s) Administered   Influenza,inj,Quad PF,6+ Mos 04/23/2018, 06/01/2019, 03/11/2021   PFIZER Comirnaty(Gray Top)Covid-19 Tri-Sucrose Vaccine 03/11/2021   PFIZER(Purple Top)SARS-COV-2 Vaccination 11/22/2019, 12/13/2019     ROS: See pertinent positives and negatives per HPI.  Past Medical History:  Diagnosis Date   Arthritis    Asthma    COVID    COVID-19    07/24/20, 05/19/21   Diverticulitis    GERD (gastroesophageal reflux disease)    Hiatal hernia    History of blood transfusion    History of chicken pox    History of GI bleed    2013/14   Hyperlipidemia    Hypertension    Leukocytosis     Past Surgical History:  Procedure Laterality Date   BREAST BIOPSY     BREAST SURGERY     bx dense breast nl per pt    CESAREAN SECTION     04/27/84   COLONOSCOPY     ESOPHAGOGASTRODUODENOSCOPY (EGD) WITH PROPOFOL N/A 08/29/2020   Procedure: ESOPHAGOGASTRODUODENOSCOPY (EGD) WITH PROPOFOL;  Surgeon: [name], MD;  Location: [facility];  Service: Gastroenterology;  Laterality: N/A;    ORIF ACETABULAR FRACTURE       Current Outpatient Medications:    Acetaminophen (TYLENOL PO), Take by mouth., Disp: , Rfl:    albuterol (VENTOLIN HFA) 108 (90 Base) MCG/ACT inhaler, INHALE 1-2 PUFFS BY MOUTH EVERY 6 (SIX) HOURS AS NEEDED, Disp: 18 each, Rfl: 11   Ascorbic Acid (VITAMIN C PO), Take by mouth daily., Disp: , Rfl:    atorvastatin (LIPITOR) 10 MG tablet, Take 1 tablet (10 mg total) by mouth daily at 6 PM., Disp: 90 tablet, Rfl: 3   azithromycin (ZITHROMAX) 250 MG tablet, With food  Take 2 tablets on day 1, then 1 tablet daily on days 2 through 5, Disp: 6 tablet, Rfl: 0   CALCIUM PO, Take by mouth daily., Disp: , Rfl:    ciclopirox (PENLAC) 8 % solution, Apply topically at bedtime. Apply over nail and surrounding skin. Apply daily over previous coat. After seven (7) days, may remove with alcohol and continue cycle use up to 48 weeks, Disp: 6.6 mL, Rfl: 5   diltiazem (CARDIZEM CD) 120 MG 24 hr capsule, Take 1 capsule (120 mg total) by mouth daily., Disp: 90 capsule, Rfl: 3   fluticasone (FLONASE) 50 MCG/ACT nasal spray, Place into both nostrils daily., Disp: , Rfl:    montelukast (SINGULAIR) 10 MG tablet, Take 1 tablet (10 mg total) by mouth at bedtime., Disp: 90 tablet, Rfl: 3   Multiple Vitamins-Minerals (ZINC PO), Take by mouth daily., Disp: , Rfl:  omeprazole (PRILOSEC) 40 MG capsule, Take 1 capsule (40 mg total) by mouth 2 (two) times daily., Disp: 180 capsule, Rfl: 0   predniSONE (DELTASONE) 20 MG tablet, Take 1 tablet (20 mg total) by mouth daily with breakfast. 5-7 days, Disp: 7 tablet, Rfl: 0   Specialty Vitamins Products (WOMENS VITA PAK PO), Take by mouth daily., Disp: , Rfl:    VITAMIN D PO, Take by mouth daily., Disp: , Rfl:    ALPRAZolam (XANAX) 0.25 MG tablet, Take 0.5-1 tablets (0.125-0.25 mg total) by mouth daily as needed for anxiety. (Patient not taking: Reported on 07/29/2021), Disp: 30 tablet, Rfl: 5   furosemide (LASIX) 20 MG tablet, Take 0.5 tablets (10 mg  total) by mouth daily as needed. (Patient not taking: Reported on 07/29/2021), Disp: 30 tablet, Rfl: 5  EXAM:  VITALS per patient if applicable:  GENERAL: alert, oriented, appears well and in no acute distress  HEENT: atraumatic, conjunttiva clear, no obvious abnormalities on inspection of external nose and ears  NECK: normal movements of the head and neck  LUNGS: on inspection no signs of respiratory distress, breathing rate appears normal, no obvious gross SOB, gasping or wheezing  CV: no obvious cyanosis  MS: moves all visible extremities without noticeable abnormality  PSYCH/NEURO: pleasant and cooperative, no obvious depression or anxiety, speech and thought processing grossly intact  ASSESSMENT AND PLAN:  Discussed the following assessment and plan:  Sore throat - Plan: POCT rapid strep A, POC Influenza A&B ([facility] test), Novel Coronavirus, NAA ([facility]), azithromycin (ZITHROMAX) 250 MG tablet  Chest congestion - Plan: POCT rapid strep A, POC Influenza A&B ([facility]ax test), Novel Coronavirus, NAA ([facility])  Complaint of nasal congestion - Plan: POCT rapid strep A, POC Influenza A&B ([facility]ax test), Novel Coronavirus, NAA ([facility])  Nonintractable headache, unspecified chronicity pattern, unspecified headache type - Plan: POCT rapid strep A, POC Influenza A&B ([facility] test), Novel Coronavirus, NAA ([facility])  Acute frontal sinusitis, recurrence not specified - Plan: azithromycin (ZITHROMAX) 250 MG tablet, predniSONE (DELTASONE) 20 MG tablet  Mild intermittent asthma with exacerbation - Plan: azithromycin (ZITHROMAX) 250 MG tablet, predniSONE (DELTASONE) 20 MG tablet  Rec add otc allergy pill  Call back if atrovent nose spray or strong cough rx desired  Rec prn albuterol inhaler  Avoid steroid inhaler h/o candida  esophagitis  -we discussed possible serious and likely etiologies, options for evaluation and workup, limitations of telemedicine visit vs in person visit, treatment,  treatment risks and precautions. Pt is agreeable to treatment via telemedicine at this moment.    I discussed the assessment and treatment plan with the patient. The patient was provided an opportunity to ask questions and all were answered. The patient agreed with the plan and demonstrated an understanding of the instructions.    Time spent 20 min Bevelyn Buckles, MD

## 2021-07-29 NOTE — Progress Notes (Signed)
Patient having sore throat, drainage, congestion in the nose and chest, headaches. Feels worse at night. Patient is using her albuterol inhaler more as well.   Symptom onset Last Tuesday. Husband had cold symptoms a few days before this. Husband did not get tested for anything.  Negative home test for COVID

## 2021-07-29 NOTE — Telephone Encounter (Signed)
Called to speak with the Patient. Scheduled her to speak with Dr French Ana [name] today, 07/29/21 at 1:15

## 2021-07-29 NOTE — Telephone Encounter (Signed)
Pt is congested, headache, chills, fatigue bad sore throat for a week. Pt has nasal congestion drainage from sinus to her throat and is giving her chest congestion. home covid test negative sent access nurse

## 2021-07-30 LAB — SARS-COV-2, NAA 2 DAY TAT

## 2021-07-30 LAB — NOVEL CORONAVIRUS, NAA: SARS-CoV-2, NAA: NOT DETECTED

## 2021-07-31 ENCOUNTER — Other Ambulatory Visit: Payer: Self-pay | Admitting: Internal Medicine

## 2021-07-31 DIAGNOSIS — B370 Candidal stomatitis: Secondary | ICD-10-CM

## 2021-07-31 MED ORDER — NYSTATIN 100000 UNIT/ML MT SUSP: 5.0000 mL | Freq: Four times a day (QID) | OROMUCOSAL | 0 refills | Status: DC

## 2021-08-11 ENCOUNTER — Inpatient Hospital Stay (HOSPITAL_BASED_OUTPATIENT_CLINIC_OR_DEPARTMENT_OTHER): Payer: Medicare HMO | Admitting: Oncology

## 2021-08-11 ENCOUNTER — Encounter: Payer: Self-pay | Admitting: Oncology

## 2021-08-11 DIAGNOSIS — D72820 Lymphocytosis (symptomatic): Secondary | ICD-10-CM

## 2021-08-11 NOTE — Progress Notes (Signed)
I connected with Cheryl Coleman on 08/11/21 at  2:00 PM EST by video enabled telemedicine visit and verified that I am speaking with the correct person using two identifiers.   I discussed the limitations, risks, security and privacy concerns of performing an evaluation and management service by telemedicine and the availability of in-person appointments. I also discussed with the patient that there may be a patient responsible charge related to this service. The patient expressed understanding and agreed to proceed.  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  Looking back at her prior CBCs her white cell count typically fluctuates between 9.5-11.5.  More recently on 07/08/2021 her white count was 9.2 with an H&H of 13.1/29.2 and a platelet count of 247.  Her hemoglobin and platelet counts have always been normal.  Her differential has shown relative lymphocytosis with an absolute lymphocyte count that ranges between 4000-4800.  Patient denies any B symptoms presently  Results of blood work from 1/6/2023Showed white cell count of 10, H&H of 13.3/36.9 with an MCV of 77 and a platelet count of 255.  Flow cytometry showed polyclonal B-cell lymphocytosis.  Reactive process or biclonal lymphoproliferative disorder with nonspecific phenotype.  Absolute eosinophilia.  Interval history patient reports doing well overall and denies any specific complaints at this time   Review of Systems  Constitutional:  Negative for chills, fever, malaise/fatigue and weight loss.  HENT:  Negative for congestion, ear discharge and nosebleeds.   Eyes:  Negative for blurred vision.   Respiratory:  Negative for cough, hemoptysis, sputum production, shortness of breath and wheezing.   Cardiovascular:  Negative for chest pain, palpitations, orthopnea and claudication.  Gastrointestinal:  Negative for abdominal pain, blood in stool, constipation, diarrhea, heartburn, melena, nausea and vomiting.  Genitourinary:  Negative for dysuria, flank pain, frequency, hematuria and urgency.  Musculoskeletal:  Negative for back pain, joint pain and myalgias.  Skin:  Negative for rash.  Neurological:  Negative for dizziness, tingling, focal weakness, seizures, weakness and headaches.  Endo/Heme/Allergies:  Does not bruise/bleed easily.  Psychiatric/Behavioral:  Negative for depression and suicidal ideas. The patient does not have insomnia.    Allergies  Allergen Reactions   Oxycodone Other (See Comments)    Feels bad    Past Medical History:  Diagnosis Date   Arthritis    Asthma    COVID    COVID-19    07/24/20, 05/19/21   Diverticulitis    GERD (gastroesophageal reflux disease)    Hiatal hernia    History of blood transfusion    History of chicken pox    History of GI bleed    2013/14   Hyperlipidemia    Hypertension    Leukocytosis     Past Surgical History:  Procedure Laterality Date   BREAST BIOPSY     BREAST SURGERY     bx dense breast nl per pt    CESAREAN SECTION     04/27/84   COLONOSCOPY     ESOPHAGOGASTRODUODENOSCOPY (EGD) WITH PROPOFOL N/A 08/29/2020   Procedure: ESOPHAGOGASTRODUODENOSCOPY (EGD) WITH PROPOFOL;  Surgeon: Lin Landsman, MD;  Location: [facility];  Service: Gastroenterology;  Laterality: N/A;   ORIF ACETABULAR FRACTURE  Social History   Socioeconomic History   Marital status: Married    Spouse name: Not on file   Number of children: Not on file   Years of education: Not on file   Highest education level: Not on file  Occupational History   Not on file  Tobacco Use   Smoking status: Never   Smokeless tobacco: Never   Vaping Use   Vaping Use: Never used  Substance and Sexual Activity   Alcohol use: Not Currently   Drug use: Not Currently   Sexual activity: Yes    Comment: husband   Other Topics Concern   Not on file  Social History Narrative   Lived in [place] from [place]    Married 1st husband died in late 62s/2000s   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, wears seat belt, safe in relationship    Social Determinants of Health   Financial Resource Strain: Not on file  Food Insecurity: Not on file  Transportation Needs: Not on file  Physical Activity: Not on file  Stress: Not on file  Social Connections: Not on file  Intimate Partner Violence: Not on file    Family History  Problem Relation Age of Onset   Hypertension Maternal Aunt    Diabetes Maternal Aunt    Breast cancer Neg Hx      Current Outpatient Medications:    Acetaminophen (TYLENOL PO), Take by mouth., Disp: , Rfl:    albuterol (VENTOLIN HFA) 108 (90 Base) MCG/ACT inhaler, INHALE 1-2 PUFFS BY MOUTH EVERY 6 (SIX) HOURS AS NEEDED, Disp: 18 each, Rfl: 11   Ascorbic Acid (VITAMIN C PO), Take by mouth daily., Disp: , Rfl:    atorvastatin (LIPITOR) 10 MG tablet, Take 1 tablet (10 mg total) by mouth daily at 6 PM., Disp: 90 tablet, Rfl: 3   CALCIUM PO, Take by mouth daily., Disp: , Rfl:    ciclopirox (PENLAC) 8 % solution, Apply topically at bedtime. Apply over nail and surrounding skin. Apply daily over previous coat. After seven (7) days, may remove with alcohol and continue cycle use up to 48 weeks, Disp: 6.6 mL, Rfl: 5   diltiazem (CARDIZEM CD) 120 MG 24 hr capsule, Take 1 capsule (120 mg total) by mouth daily., Disp: 90 capsule, Rfl: 3   fluticasone (FLONASE) 50 MCG/ACT nasal spray, Place into both nostrils daily., Disp: , Rfl:    montelukast (SINGULAIR) 10 MG tablet, Take 1 tablet (10 mg total) by mouth at bedtime., Disp: 90 tablet, Rfl: 3   Multiple Vitamins-Minerals (ZINC PO), Take by mouth daily., Disp: , Rfl:    omeprazole  (PRILOSEC) 40 MG capsule, Take 1 capsule (40 mg total) by mouth 2 (two) times daily., Disp: 180 capsule, Rfl: 0   Specialty Vitamins Products (WOMENS VITA PAK PO), Take by mouth daily., Disp: , Rfl:    VITAMIN D PO, Take by mouth daily., Disp: , Rfl:    ALPRAZolam (XANAX) 0.25 MG tablet, Take 0.5-1 tablets (0.125-0.25 mg total) by mouth daily as needed for anxiety. (Patient not taking: Reported on 07/29/2021), Disp: 30 tablet, Rfl: 5   furosemide (LASIX) 20 MG tablet, Take 0.5 tablets (10 mg total) by mouth daily as needed. (Patient not taking: Reported on 07/29/2021), Disp: 30 tablet, Rfl: 5   nystatin (MYCOSTATIN) 100000 UNIT/ML suspension, Take 5 mLs (500,000 Units total) by mouth 4 (four) times daily. X7-10 days (Patient not taking: Reported on 08/11/2021), Disp: 473 mL, Rfl: 0  predniSONE (DELTASONE) 20 MG tablet, Take 1 tablet (20 mg total) by mouth daily with breakfast. 5-7 days (Patient not taking: Reported on 08/11/2021), Disp: 7 tablet, Rfl: 0  No results found.  No images are attached to the encounter.   CMP Latest Ref Rng & Units 07/08/2021  Glucose 70 - 99 mg/dL 81  BUN 6 - 23 mg/dL 13  Creatinine 0.40 - 1.20 mg/dL 0.86  Sodium 135 - 145 mEq/L 138  Potassium 3.5 - 5.1 mEq/L 4.0  Chloride 96 - 112 mEq/L 103  CO2 19 - 32 mEq/L 30  Calcium 8.4 - 10.5 mg/dL 9.4  Total Protein 6.0 - 8.3 g/dL 7.9  Total Bilirubin 0.2 - 1.2 mg/dL 0.5  Alkaline Phos 39 - 117 U/L 57  AST 0 - 37 U/L 15  ALT 0 - 35 U/L 12   CBC Latest Ref Rng & Units 07/25/2021  WBC 4.0 - 10.5 K/uL 10.0  Hemoglobin 12.0 - 15.0 g/dL 13.3  Hematocrit 36.0 - 46.0 % 36.9  Platelets 150 - 400 K/uL 255     Observation/objective: Appears in no acute distress over video visit today.  Breathing is nonlabored  Assessment and plan: Patient is a 66 year old female referred for lymphocytosis likely reactive  Absolute lymphocyte count is mildly elevated between 4000-5000 consistently for the last 2 years.  White cell count  fluctuates between 10-11.  No evidence of anemia or thrombocytopenia.  Flow cytometry shows polyclonal B-cell lymphocytosis which could be reactive versus biclonal with nonspecific phenotype.  Plan is to observe this conservatively without the need for bone marrow biopsy given the absence of any significant cytopenias, worsening lymphocytosis or B symptoms.  Repeat CBC with differential in 6 months and I will see her thereafter  Follow-up instructions: As above  I discussed the assessment and treatment plan with the patient. The patient was provided an opportunity to ask questions and all were answered. The patient agreed with the plan and demonstrated an understanding of the instructions.   The patient was advised to call back or seek an in-person evaluation if the symptoms worsen or if the condition fails to improve as anticipated.   Visit Diagnosis: 1. Lymphocytosis     Dr. Randa Evens, MD, MPH [facility] Tel- [phone] 08/11/2021 3:40 PM

## 2021-08-20 ENCOUNTER — Ambulatory Visit
Admission: RE | Admit: 2021-08-20 | Discharge: 2021-08-20 | Disposition: A | Discharge status: Home / Self Care | Payer: Medicare HMO | Source: Ambulatory Visit | Attending: Internal Medicine | Admitting: Internal Medicine

## 2021-08-20 ENCOUNTER — Other Ambulatory Visit: Payer: Self-pay

## 2021-08-20 DIAGNOSIS — Z1231 Encounter for screening mammogram for malignant neoplasm of breast: Secondary | ICD-10-CM | POA: Diagnosis not present

## 2021-09-04 ENCOUNTER — Other Ambulatory Visit: Payer: Self-pay | Admitting: Internal Medicine

## 2021-09-04 DIAGNOSIS — E785 Hyperlipidemia, unspecified: Secondary | ICD-10-CM

## 2021-09-09 ENCOUNTER — Other Ambulatory Visit: Payer: Self-pay | Admitting: Internal Medicine

## 2021-09-10 ENCOUNTER — Telehealth: Payer: Self-pay | Admitting: Internal Medicine

## 2021-09-10 NOTE — Telephone Encounter (Signed)
Awaiting access nurse doccumentation

## 2021-09-10 NOTE — Telephone Encounter (Signed)
Left message to return call.  Patient informed by access nurse to be seen within 2 weeks as blood pressure is elevated but Patient reported to them not having any symptoms.   Please schedule Patient for an appointment within 2 weeks if calling back in.

## 2021-09-10 NOTE — Telephone Encounter (Signed)
Pt called in stating that her BP has been high recently 170 to 180 in the evening. Pt stated she was dizzy, had a headache and was vision impaired on Monday evening. Pt stated she takes her BP in the morning before she takes her medication and then after she takes it, it comes down to 120. Sent to access nurse.

## 2021-09-11 ENCOUNTER — Telehealth: Payer: Self-pay

## 2021-09-11 NOTE — Telephone Encounter (Signed)
FYI patient scheduled with PCP for 09/12/21

## 2021-09-11 NOTE — Telephone Encounter (Signed)
Pt called in to see if Dr. French Ana can squeeze her in today since she started a new job. Pt requesting callback.

## 2021-09-11 NOTE — Telephone Encounter (Signed)
Attempted to call pt to complete pre-visit planning. °LM for pt to cb. °

## 2021-09-11 NOTE — Telephone Encounter (Signed)
Noted will see 09/12/21 htn

## 2021-09-12 ENCOUNTER — Ambulatory Visit (INDEPENDENT_AMBULATORY_CARE_PROVIDER_SITE_OTHER): Payer: Medicare HMO | Admitting: Internal Medicine

## 2021-09-12 ENCOUNTER — Other Ambulatory Visit: Payer: Self-pay

## 2021-09-12 ENCOUNTER — Encounter: Payer: Self-pay | Admitting: Internal Medicine

## 2021-09-12 VITALS — BP 142/80 | HR 72 | Temp 98.2°F | Ht 64.0 in | Wt 226.0 lb

## 2021-09-12 DIAGNOSIS — I10 Essential (primary) hypertension: Secondary | ICD-10-CM

## 2021-09-12 DIAGNOSIS — R7303 Prediabetes: Secondary | ICD-10-CM

## 2021-09-12 DIAGNOSIS — R002 Palpitations: Secondary | ICD-10-CM | POA: Diagnosis not present

## 2021-09-12 MED ORDER — OLMESARTAN MEDOXOMIL 5 MG PO TABS: 5.0000 mg | ORAL_TABLET | Freq: Every day | ORAL | 3 refills | Status: DC

## 2021-09-12 MED ORDER — DILTIAZEM HCL ER COATED BEADS 240 MG PO CP24: 240.0000 mg | ORAL_CAPSULE | Freq: Every day | ORAL | 3 refills | Status: DC

## 2021-09-12 NOTE — Progress Notes (Signed)
Chief Complaint  Patient presents with   Follow-up    F/u per Access Nurse - Elevated B/P   F/u  1. Htn and elevated HT HR 86 to 90s to 199 on cardizem 120 cd qd and BP 140s-150s-160s-180s-202/80s-90s and Monday had h/a/head pounding and dizziness and head throbbing  She was sitting by fireplace and moved  Review of Systems  Constitutional:  Negative for weight loss.  HENT:  Negative for hearing loss.   Eyes:  Negative for blurred vision.  Respiratory:  Negative for shortness of breath.   Cardiovascular:  Positive for palpitations. Negative for chest pain.  Gastrointestinal:  Negative for abdominal pain and blood in stool.  Genitourinary:  Negative for dysuria.  Musculoskeletal:  Negative for falls and joint pain.  Skin:  Negative for rash.  Neurological:  Positive for dizziness and headaches.  Psychiatric/Behavioral:  Negative for depression.   Past Medical History:  Diagnosis Date   Arthritis    Asthma    COVID    COVID-19    07/24/20, 05/19/21   Diverticulitis    GERD (gastroesophageal reflux disease)    Hiatal hernia    History of blood transfusion    History of chicken pox    History of GI bleed    2013/14   Hyperlipidemia    Hypertension    Leukocytosis    Past Surgical History:  Procedure Laterality Date   BREAST CYST ASPIRATION Right    CESAREAN SECTION     04/27/84   COLONOSCOPY     ESOPHAGOGASTRODUODENOSCOPY (EGD) WITH PROPOFOL N/A 08/29/2020   Procedure: ESOPHAGOGASTRODUODENOSCOPY (EGD) WITH PROPOFOL;  Surgeon: Lin Landsman, MD;  Location: [place];  Service: Gastroenterology;  Laterality: N/A;   ORIF ACETABULAR FRACTURE     Family History  Problem Relation Age of Onset   Hypertension Maternal Aunt    Diabetes Maternal Aunt    Breast cancer Neg Hx    Social History   Socioeconomic History   Marital status: Married    Spouse name: Not on file   Number of children: Not on file   Years of education: Not on file   Highest education level:  Not on file  Occupational History   Not on file  Tobacco Use   Smoking status: Never   Smokeless tobacco: Never  Vaping Use   Vaping Use: Never used  Substance and Sexual Activity   Alcohol use: Not Currently   Drug use: Not Currently   Sexual activity: Yes    Comment: husband   Other Topics Concern   Not on file  Social History Narrative   Lived in [place] from [place]    Married 1st husband died in late 4s/2000s   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   albuterol (VENTOLIN HFA) 108 (90 Base) MCG/ACT inhaler INHALE 1-2 PUFFS BY MOUTH EVERY 6 (SIX) HOURS AS NEEDED   ALPRAZolam (XANAX) 0.25 MG tablet Take 0.5-1 tablets (0.125-0.25 mg total) by mouth daily as needed for anxiety.   Ascorbic Acid (VITAMIN C PO) Take by mouth  daily.   atorvastatin (LIPITOR) 10 MG tablet TAKE 1 TABLET (10 MG TOTAL) BY MOUTH DAILY AT 6 PM.   CALCIUM PO Take by mouth daily.   ciclopirox (PENLAC) 8 % solution Apply topically at bedtime. Apply over nail and surrounding skin. Apply daily over previous coat. After seven (7) days, may remove with alcohol and continue cycle use up to 48 weeks   fluticasone (FLONASE) 50 MCG/ACT nasal spray SPRAY 2 SPRAYS INTO EACH NOSTRIL EVERY DAY   montelukast (SINGULAIR) 10 MG tablet Take 1 tablet (10 mg total) by mouth at bedtime.   Multiple Vitamins-Minerals (ZINC PO) Take by mouth daily.   nystatin (MYCOSTATIN) 100000 UNIT/ML suspension Take 5 mLs (500,000 Units total) by mouth 4 (four) times daily. X7-10 days   olmesartan (BENICAR) 5 MG tablet Take 1 tablet (5 mg total) by mouth daily. At night    omeprazole (PRILOSEC) 40 MG capsule Take 1 capsule (40 mg total) by mouth 2 (two) times daily.   Specialty Vitamins Products (WOMENS VITA PAK PO) Take by mouth daily.   VITAMIN D PO Take by mouth daily.   [DISCONTINUED] diltiazem (CARDIZEM CD) 120 MG 24 hr capsule Take 1 capsule (120 mg total) by mouth daily.   [DISCONTINUED] furosemide (LASIX) 20 MG tablet Take 0.5 tablets (10 mg total) by mouth daily as needed.   [DISCONTINUED] predniSONE (DELTASONE) 20 MG tablet Take 1 tablet (20 mg total) by mouth daily with breakfast. 5-7 days   Allergies  Allergen Reactions   Oxycodone Other (See Comments)    Feels bad   Recent Results (from the past 2160 hour(s))  Comprehensive metabolic panel     Status: None   Collection Time: 07/08/21 11:35 AM  Result Value Ref Range   Sodium 138 135 - 145 mEq/L   Potassium 4.0 3.5 - 5.1 mEq/L   Chloride 103 96 - 112 mEq/L   CO2 30 19 - 32 mEq/L   Glucose, Bld 81 70 - 99 mg/dL   BUN 13 6 - 23 mg/dL   Creatinine, Ser 0.86 0.40 - 1.20 mg/dL   Total Bilirubin 0.5 0.2 - 1.2 mg/dL   Alkaline Phosphatase 57 39 - 117 U/L   AST 15 0 - 37 U/L   ALT 12 0 - 35 U/L   Total Protein 7.9 6.0 - 8.3 g/dL   Albumin 4.1 3.5 - 5.2 g/dL   GFR 70.13 >60.00 mL/min    Comment: Calculated using the CKD-EPI Creatinine Equation (2021)   Calcium 9.4 8.4 - 10.5 mg/dL  Lipid panel     Status: None   Collection Time: 07/08/21 11:35 AM  Result Value Ref Range   Cholesterol 173 0 - 200 mg/dL    Comment: ATP III Classification       Desirable:  < 200 mg/dL               Borderline High:  200 - 239 mg/dL          High:  > = 240 mg/dL   Triglycerides 62.0 0.0 - 149.0 mg/dL    Comment: Normal:  <150 mg/dLBorderline High:  150 - 199 mg/dL   HDL 63.30 >39.00 mg/dL   VLDL 12.4 0.0 - 40.0 mg/dL   LDL Cholesterol 98 0 - 99 mg/dL   Total CHOL/HDL Ratio 3     Comment:                Men          Women1/2 Average Risk  3.4          3.3Average Risk          5.0          4.42X Average Risk           9.6          7.13X Average Risk          15.0          11.0                       NonHDL 110.16     Comment: NOTE:  Non-HDL goal should be 30 mg/dL higher than patient's LDL goal (i.e. LDL goal of < 70 mg/dL, would have non-HDL goal of < 100 mg/dL)  CBC with Differential/Platelet     Status: Abnormal   Collection Time: 07/08/21 11:35 AM  Result Value Ref Range   WBC 9.2 4.0 - 10.5 K/uL   RBC 4.87 3.87 - 5.11 Mil/uL   Hemoglobin 13.1 12.0 - 15.0 g/dL   HCT 39.2 36.0 - 46.0 %   MCV 80.5 78.0 - 100.0 fl   MCHC 33.5 30.0 - 36.0 g/dL   RDW 16.5 (H) 11.5 - 15.5 %   Platelets 247.0 150.0 - 400.0 K/uL   Neutrophils Relative % 43.5 43.0 - 77.0 %   Lymphocytes Relative 46.8 (H) 12.0 - 46.0 %   Monocytes Relative 6.7 3.0 - 12.0 %   Eosinophils Relative 2.5 0.0 - 5.0 %   Basophils Relative 0.5 0.0 - 3.0 %   Neutro Abs 4.0 1.4 - 7.7 K/uL   Lymphs Abs 4.3 (H) 0.7 - 4.0 K/uL   Monocytes Absolute 0.6 0.1 - 1.0 K/uL   Eosinophils Absolute 0.2 0.0 - 0.7 K/uL   Basophils Absolute 0.0 0.0 - 0.1 K/uL  Pathologist smear review     Status: None   Collection Time: 07/08/21 11:35 AM  Result Value Ref Range   Path Review      Comment: Absolute lymphocytosis. Myeloid population consists predominantly of mature segmented neutrophils with mild reactive changes. A few atypical lymphs. No immature cells are identified. However, if absolute lymphocytosis persists, suggest immunophenotyping by flow cytometry for further evaluation, if clinically indicated. Microcytosis and concurrent relative erythrocytosis is suggestive of thalassemia, in the absence of iron deficiency. Review of the peripheral smear reveals adequate numbers of platelets. Reviewed by Francis Gaines Rockne Coons, MD  (Electronic Signature on [id number].   Hemoglobin A1c     Status: None   Collection Time: 07/08/21 11:35 AM  Result Value Ref Range   Hgb A1c MFr Bld 6.0 4.6 - 6.5 %    Comment: Glycemic Control Guidelines for  People with Diabetes:Non Diabetic:  <6%Goal of Therapy: <7%Additional Action Suggested:  >8%   Urinalysis, Routine w reflex microscopic     Status: Abnormal   Collection Time: 07/08/21 11:35 AM  Result Value Ref Range   Color, Urine YELLOW YELLOW   APPearance CLEAR CLEAR   Specific Gravity, Urine 1.012 1.001 - 1.035   pH 5.5 5.0 - 8.0   Glucose, UA NEGATIVE NEGATIVE   Bilirubin Urine NEGATIVE NEGATIVE   Ketones, ur NEGATIVE NEGATIVE   Hgb urine dipstick NEGATIVE NEGATIVE   Protein, ur TRACE (A) NEGATIVE   Nitrite NEGATIVE NEGATIVE   Leukocytes,Ua NEGATIVE NEGATIVE   WBC, UA NONE SEEN 0 - 5 /HPF   RBC / HPF NONE SEEN 0 - 2 /HPF   Squamous Epithelial /  LPF NONE SEEN < OR = 5 /HPF   Bacteria, UA NONE SEEN NONE SEEN /HPF   Hyaline Cast NONE SEEN NONE SEEN /LPF  MICROSCOPIC MESSAGE     Status: None   Collection Time: 07/08/21 11:35 AM  Result Value Ref Range   Note      Comment: This urine was analyzed for the presence of WBC,  RBC, bacteria, casts, and other formed elements.  Only those elements seen were reported. [place] cytometry panel-leukemia/lymphoma work-up     Status: None   Collection Time: 07/25/21 11:45 AM  Result Value Ref Range   PATH INTERP XXX-IMP Comment     Comment: (NOTE) 1)  Absolute increase in B cells detected 2)  Absolute eosinophilia (See Comment.)    ANNOTATION COMMENT IMP Comment     Comment: (NOTE) 1)  The differential diagnosis includes persistent polyclonal B lymphocytosis, a reactive process or biclonal lymphoproliferative disorder with a nonspecific phenotype. Clinical correlation is recommended. If clinically indicated, B cell genotyping by PCR is available to confirm or exclude a clonal B lymphocyte population. 2)  Eosinophilia may be associated with drug reactions, parasitic infections, and neoplastic processes, among others. Recommend clinical correlation and follow up as appropriate.    CLINICAL INFO Comment     Comment:  (NOTE) Accompanying CBC dated 07/25/2021 shows: WBC count 10.0, Neu 4.6, Lym 4.1, Mon 0.8, Eos 0.5.    Specimen Type Comment     Comment: Peripheral blood   ASSESSMENT OF LEUKOCYTES Comment     Comment: (NOTE) No monoclonal B cell population is detected. kappa:lambda ratio 1.8 There is no loss of, or aberrant expression of, the pan T cell antigens to suggest a neoplastic T cell process. An increased CD4/T helper to CD8/T suppressor cell ratio is detected. CD4:CD8 ratio 6.1 No circulating blasts are detected. There is no immunophenotypic evidence of abnormal myeloid maturation. Analysis of the leukocyte population shows: granulocytes 65% (including 5% eosinophils), monocytes 5%, lymphocytes 30%, blasts <0.1%, B cells 8%, T cells 19%, NK cells 3%.    % Viable Cells Comment     Comment: 94%   ANALYSIS AND GATING STRATEGY Comment     Comment: 8 color analysis with CD45/SSC gating   IMMUNOPHENOTYPING STUDY Comment     Comment: (NOTE) CD2       Normal         CD3       Normal CD4       Normal         CD5       Normal CD7       Normal         CD8       Normal CD10      Normal         CD11b     Normal CD13      Normal         CD14      Normal CD16      Normal         CD19      Normal CD20      Normal         CD33      Normal CD34      Normal         CD38      Normal CD45      Normal         CD56      Normal CD57  Normal         CD117     Normal HLA-DR    Normal         KAPPA     Normal LAMBDA    Normal         CD64      Normal    PATHOLOGIST NAME Comment     Comment: Lovett Sox, M.D.   COMMENT: Comment     Comment: (NOTE) Each antibody in this assay was utilized to assess for potential abnormalities of studied cell populations or to characterize identified abnormalities. This test was developed and its performance characteristics determined by [facility].  It has not been cleared or approved by the U.S. Food and Drug Administration. The FDA has determined that such  clearance or approval is not necessary. This test is used for clinical purposes.  It should not be regarded as investigational or for research. Performed At: [facility] [address] [place], [place] [id number] Katina Degree MDPhD [id number] Performed At: [facility] [address] [place], [place] [id number] Katina Degree MDPhD [id number]   CBC with Differential/Platelet     Status: Abnormal   Collection Time: 07/25/21 11:45 AM  Result Value Ref Range   WBC 10.0 4.0 - 10.5 K/uL   RBC 4.76 3.87 - 5.11 MIL/uL   Hemoglobin 13.3 12.0 - 15.0 g/dL   HCT 36.9 36.0 - 46.0 %   MCV 77.5 (L) 80.0 - 100.0 fL   MCH 27.9 26.0 - 34.0 pg   MCHC 36.0 30.0 - 36.0 g/dL   RDW 15.6 (H) 11.5 - 15.5 %   Platelets 255 150 - 400 K/uL   nRBC 0.0 0.0 - 0.2 %   Neutrophils Relative % 45 %   Neutro Abs 4.6 1.7 - 7.7 K/uL   Lymphocytes Relative 41 %   Lymphs Abs 4.1 (H) 0.7 - 4.0 K/uL   Monocytes Relative 8 %   Monocytes Absolute 0.8 0.1 - 1.0 K/uL   Eosinophils Relative 5 %   Eosinophils Absolute 0.5 0.0 - 0.5 K/uL   Basophils Relative 1 %   Basophils Absolute 0.1 0.0 - 0.1 K/uL   Immature Granulocytes 0 %   Abs Immature Granulocytes 0.01 0.00 - 0.07 K/uL    Comment: Performed at [facility], [address] [place],  [id number]  Novel Coronavirus, NAA ([facility])     Status: None   Collection Time: 07/29/21  3:29 PM   Specimen: Nasopharyngeal(NP) swabs in vial transport medium   Nasopharynge  Previous  Result Value Ref Range   SARS-CoV-2, NAA Not Detected Not Detected    Comment: This nucleic acid amplification test was developed and its performance characteristics determined by [employer]. Nucleic acid amplification tests include RT-PCR and TMA. This test has not been FDA cleared or approved. This test has been authorized by FDA under an Emergency Use Authorization (EUA). This test is only authorized for the duration of time the declaration that circumstances  exist justifying the authorization of the emergency use of in vitro diagnostic tests for detection of SARS-CoV-2 virus and/or diagnosis of COVID-19 infection under section 564(b)(1) of the Act, 21 U.S.C. 096GEZ-6(O) (1), unless the authorization is terminated or revoked sooner. When diagnostic testing is negative, the possibility of a false negative result should be considered in the context of a patient's recent exposures and the presence of clinical signs and symptoms consistent with COVID-19. An individual without symptoms of COVID-19 and who is not shedding  SARS-CoV-2 virus wo uld expect to have a negative (not detected) result in this assay.   SARS-COV-2, NAA 2 DAY TAT     Status: None   Collection Time: 07/29/21  3:29 PM   Nasopharynge  Previous  Result Value Ref Range   SARS-CoV-2, NAA 2 DAY TAT Performed   POCT rapid strep A     Status: Normal   Collection Time: 07/29/21  3:38 PM  Result Value Ref Range   Rapid Strep A Screen Negative Negative  POC Influenza A&B (Binax test)     Status: Normal   Collection Time: 07/29/21  3:38 PM  Result Value Ref Range   Influenza A, POC Negative Negative   Influenza B, POC Negative Negative   Objective  Body mass index is 38.79 kg/m. Wt Readings from Last 3 Encounters:  09/12/21 226 lb (102.5 kg)  07/29/21 225 lb (102.1 kg)  07/25/21 225 lb 6.4 oz (102.2 kg)   Temp Readings from Last 3 Encounters:  09/12/21 98.2 F (36.8 C) (Oral)  07/25/21 98.6 F (37 C) (Tympanic)  07/08/21 (!) 97 F (36.1 C) (Temporal)   BP Readings from Last 3 Encounters:  09/12/21 (!) 142/80  07/25/21 120/68  07/08/21 124/82   Pulse Readings from Last 3 Encounters:  09/12/21 72  07/25/21 96  07/08/21 95    Physical Exam Vitals and nursing note reviewed.  Constitutional:      Appearance: Normal appearance. She is well-developed and well-groomed.  HENT:     Head: Normocephalic and atraumatic.  Eyes:     Conjunctiva/sclera: Conjunctivae  normal.     Pupils: Pupils are equal, round, and reactive to light.  Cardiovascular:     Rate and Rhythm: Normal rate and regular rhythm.     Heart sounds: Normal heart sounds. No murmur heard. Pulmonary:     Effort: Pulmonary effort is normal.     Breath sounds: Normal breath sounds.  Abdominal:     General: Abdomen is flat. Bowel sounds are normal.     Tenderness: There is no abdominal tenderness.  Musculoskeletal:        General: No tenderness.  Skin:    General: Skin is warm and dry.  Neurological:     General: No focal deficit present.     Mental Status: She is alert and oriented to person, place, and time. Mental status is at baseline.     Cranial Nerves: Cranial nerves 2-12 are intact.     Motor: Motor function is intact.     Coordination: Coordination is intact.     Gait: Gait is intact.  Psychiatric:        Attention and Perception: Attention and perception normal.        Mood and Affect: Mood and affect normal.        Speech: Speech normal.        Behavior: Behavior normal. Behavior is cooperative.        Thought Content: Thought content normal.        Cognition and Memory: Cognition and memory normal.        Judgment: Judgment normal.    Assessment  Plan  Essential hypertension - Plan: diltiazem (CARDIZEM CD) 240 MG 24 hr capsule inc from 120, olmesartan (BENICAR) 5 MG tablet qhs due to BP elevated at night consider titrate up, Comprehensive metabolic panel, Lipid panel, CBC with Differential/Platelet  Prediabetes - Plan: Hemoglobin A1c  Palpitations  Inc dilt 120 to 240  F/u cards leb   HM  Flu shot utd Tdap check records [place]  pna had 2019 [place]  -consider prevnar and pna 23 get records D.[facility] [place] Consider shingrix in future  [place] covid 3/3 rec 4th   04/02/20 mammo normal ordered sch 08/20/21 negative    Colonoscopy get records [place] obtained:  colonoscopy 10/15/06 mod to severe diverticulosis [facility] f/u in 5 years  EGD 11/29/07 candida, mild  gastritis  EGD 01/03/13/colonoscopy diverticulosis ext and int hemorrhoids TI with ulceration neg H pylori UGD 02/01/18 gastritis ? If had colonoscopy  GI established [place] f/u 08/20/20   Pap 10/2019 normal neg neg hpv   DEXA age 73 osteopenia calcium + vit D repeat 3-5 years had 04/02/20    Provider: Dr. Olivia Mackie [name]-Internal Medicine

## 2021-09-12 NOTE — Patient Instructions (Addendum)
Goal blood pressure <130/<80  Goal heart rate 60 to 100   Vitamin D3 1000 to 2000 IU daily 1 tums daily  Check amount in Multivitamin     Call ENT for appt   [phone] Not available [address]   [place] [id number]      [employer]    Hypertension, Adult High blood pressure (hypertension) is when the force of blood pumping through the arteries is too strong. The arteries are the blood vessels that carry blood from the heart throughout the body. Hypertension forces the heart to work harder to pump blood and may cause arteries to become narrow or stiff. Untreated or uncontrolled hypertension can cause a heart attack, heart failure, a stroke, kidney disease, and other problems. A blood pressure reading consists of a higher number over a lower number. Ideally, your blood pressure should be below 120/80. The first ("top") number is called the systolic pressure. It is a measure of the pressure in your arteries as your heart beats. The second ("bottom") number is called the diastolic pressure. It is a measure of the pressure in your arteries as the heart relaxes. What are the causes? The exact cause of this condition is not known. There are some conditions that result in or are related to high blood pressure. What increases the risk? Some risk factors for high blood pressure are under your control. The following factors may make you more likely to develop this condition: Smoking. Having type 2 diabetes mellitus, high cholesterol, or both. Not getting enough exercise or physical activity. Being overweight. Having too much fat, sugar, calories, or salt (sodium) in your diet. Drinking too much alcohol. Some risk factors for high blood pressure may be difficult or impossible to change. Some of these factors include: Having chronic kidney disease. Having a family history of high blood pressure. Age. Risk increases with age. Race. You may  be at higher risk if you are African American. Gender. Men are at higher risk than women before age 70. After age 86, women are at higher risk than men. Having obstructive sleep apnea. Stress. What are the signs or symptoms? High blood pressure may not cause symptoms. Very high blood pressure (hypertensive crisis) may cause: Headache. Anxiety. Shortness of breath. Nosebleed. Nausea and vomiting. Vision changes. Severe chest pain. Seizures. How is this diagnosed? This condition is diagnosed by measuring your blood pressure while you are seated, with your arm resting on a flat surface, your legs uncrossed, and your feet flat on the floor. The cuff of the blood pressure monitor will be placed directly against the skin of your upper arm at the level of your heart. It should be measured at least twice using the same arm. Certain conditions can cause a difference in blood pressure between your right and left arms. Certain factors can cause blood pressure readings to be lower or higher than normal for a short period of time: When your blood pressure is higher when you are in a health care provider's office than when you are at home, this is called white coat hypertension. Most people with this condition do not need medicines. When your blood pressure is higher at home than when you are in a health care provider's office, this is called masked hypertension. Most people with this condition may need medicines to control blood pressure. If you have a high blood pressure reading during one visit or you have normal blood pressure with other risk  factors, you may be asked to: Return on a different day to have your blood pressure checked again. Monitor your blood pressure at home for 1 week or longer. If you are diagnosed with hypertension, you may have other blood or imaging tests to help your health care provider understand your overall risk for other conditions. How is this treated? This condition is  treated by making healthy lifestyle changes, such as eating healthy foods, exercising more, and reducing your alcohol intake. Your health care provider may prescribe medicine if lifestyle changes are not enough to get your blood pressure under control, and if: Your systolic blood pressure is above 130. Your diastolic blood pressure is above 80. Your personal target blood pressure may vary depending on your medical conditions, your age, and other factors. Follow these instructions at home: Eating and drinking  Eat a diet that is high in fiber and potassium, and low in sodium, added sugar, and fat. An example eating plan is called the DASH (Dietary Approaches to Stop Hypertension) diet. To eat this way: Eat plenty of fresh fruits and vegetables. Try to fill one half of your plate at each meal with fruits and vegetables. Eat whole grains, such as whole-wheat pasta, brown rice, or whole-grain bread. Fill about one fourth of your plate with whole grains. Eat or drink low-fat dairy products, such as skim milk or low-fat yogurt. Avoid fatty cuts of meat, processed or cured meats, and poultry with skin. Fill about one fourth of your plate with lean proteins, such as fish, chicken without skin, beans, eggs, or tofu. Avoid pre-made and processed foods. These tend to be higher in sodium, added sugar, and fat. Reduce your daily sodium intake. Most people with hypertension should eat less than 1,500 mg of sodium a day. Do not drink alcohol if: Your health care provider tells you not to drink. You are pregnant, may be pregnant, or are planning to become pregnant. If you drink alcohol: Limit how much you use to: 0-1 drink a day for women. 0-2 drinks a day for men. Be aware of how much alcohol is in your drink. In the [place] one drink equals one 12 oz bottle of beer (355 mL), one 5 oz glass of wine (148 mL), or one 1 oz glass of hard liquor (44 mL). Lifestyle  Work with your health care provider to maintain  a healthy body weight or to lose weight. Ask what an ideal weight is for you. Get at least 30 minutes of exercise most days of the week. Activities may include walking, swimming, or biking. Include exercise to strengthen your muscles (resistance exercise), such as Pilates or lifting weights, as part of your weekly exercise routine. Try to do these types of exercises for 30 minutes at least 3 days a week. Do not use any products that contain nicotine or tobacco, such as cigarettes, e-cigarettes, and chewing tobacco. If you need help quitting, ask your health care provider. Monitor your blood pressure at home as told by your health care provider. Keep all follow-up visits as told by your health care provider. This is important. Medicines Take over-the-counter and prescription medicines only as told by your health care provider. Follow directions carefully. Blood pressure medicines must be taken as prescribed. Do not skip doses of blood pressure medicine. Doing this puts you at risk for problems and can make the medicine less effective. Ask your health care provider about side effects or reactions to medicines that you should watch for. Contact a health  care provider if you: Think you are having a reaction to a medicine you are taking. Have headaches that keep coming back (recurring). Feel dizzy. Have swelling in your ankles. Have trouble with your vision. Get help right away if you: Develop a severe headache or confusion. Have unusual weakness or numbness. Feel faint. Have severe pain in your chest or abdomen. Vomit repeatedly. Have trouble breathing. Summary Hypertension is when the force of blood pumping through your arteries is too strong. If this condition is not controlled, it may put you at risk for serious complications. Your personal target blood pressure may vary depending on your medical conditions, your age, and other factors. For most people, a normal blood pressure is less than  120/80. Hypertension is treated with lifestyle changes, medicines, or a combination of both. Lifestyle changes include losing weight, eating a healthy, low-sodium diet, exercising more, and limiting alcohol. This information is not intended to replace advice given to you by your health care provider. Make sure you discuss any questions you have with your health care provider. Document Revised: 03/16/2018 Document Reviewed: 03/16/2018 Elsevier Patient Education  2022 [place] Eating Plan DASH stands for Dietary Approaches to Stop Hypertension. The DASH eating plan is a healthy eating plan that has been shown to: Reduce high blood pressure (hypertension). Reduce your risk for type 2 diabetes, heart disease, and stroke. Help with weight loss. What are tips for following this plan? Reading food labels Check food labels for the amount of salt (sodium) per serving. Choose foods with less than 5 percent of the Daily Value of sodium. Generally, foods with less than 300 milligrams (mg) of sodium per serving fit into this eating plan. To find whole grains, look for the word "whole" as the first word in the ingredient list. Shopping Buy products labeled as "low-sodium" or "no salt added." Buy fresh foods. Avoid canned foods and pre-made or frozen meals. Cooking Avoid adding salt when cooking. Use salt-free seasonings or herbs instead of table salt or sea salt. Check with your health care provider or pharmacist before using salt substitutes. Do not fry foods. Cook foods using healthy methods such as baking, boiling, grilling, roasting, and broiling instead. Cook with heart-healthy oils, such as olive, canola, avocado, soybean, or sunflower oil. Meal planning  Eat a balanced diet that includes: 4 or more servings of fruits and 4 or more servings of vegetables each day. Try to fill one-half of your plate with fruits and vegetables. 6-8 servings of whole grains each day. Less than 6 oz (170  g) of lean meat, poultry, or fish each day. A 3-oz (85-g) serving of meat is about the same size as a deck of cards. One egg equals 1 oz (28 g). 2-3 servings of low-fat dairy each day. One serving is 1 cup (237 mL). 1 serving of nuts, seeds, or beans 5 times each week. 2-3 servings of heart-healthy fats. Healthy fats called omega-3 fatty acids are found in foods such as walnuts, flaxseeds, fortified milks, and eggs. These fats are also found in cold-water fish, such as sardines, salmon, and mackerel. Limit how much you eat of: Canned or prepackaged foods. Food that is high in trans fat, such as some fried foods. Food that is high in saturated fat, such as fatty meat. Desserts and other sweets, sugary drinks, and other foods with added sugar. Full-fat dairy products. Do not salt foods before eating. Do not eat more than 4 egg yolks a week. Try to eat  at least 2 vegetarian meals a week. Eat more home-cooked food and less restaurant, buffet, and fast food. Lifestyle When eating at a restaurant, ask that your food be prepared with less salt or no salt, if possible. If you drink alcohol: Limit how much you use to: 0-1 drink a day for women who are not pregnant. 0-2 drinks a day for men. Be aware of how much alcohol is in your drink. In the [place] one drink equals one 12 oz bottle of beer (355 mL), one 5 oz glass of wine (148 mL), or one 1 oz glass of hard liquor (44 mL). General information Avoid eating more than 2,300 mg of salt a day. If you have hypertension, you may need to reduce your sodium intake to 1,500 mg a day. Work with your health care provider to maintain a healthy body weight or to lose weight. Ask what an ideal weight is for you. Get at least 30 minutes of exercise that causes your heart to beat faster (aerobic exercise) most days of the week. Activities may include walking, swimming, or biking. Work with your health care provider or dietitian to adjust your eating plan to your  individual calorie needs. What foods should I eat? Fruits All fresh, dried, or frozen fruit. Canned fruit in natural juice (without added sugar). Vegetables Fresh or frozen vegetables (raw, steamed, roasted, or grilled). Low-sodium or reduced-sodium tomato and vegetable juice. Low-sodium or reduced-sodium tomato sauce and tomato paste. Low-sodium or reduced-sodium canned vegetables. Grains Whole-grain or whole-wheat bread. Whole-grain or whole-wheat pasta. Brown rice. [place]. Bulgur. Whole-grain and low-sodium cereals. Pita bread. Low-fat, low-sodium crackers. Whole-wheat flour tortillas. Meats and other proteins Skinless chicken or [place]. Ground chicken or [place]. Pork with fat trimmed off. Fish and seafood. Egg whites. Dried beans, peas, or lentils. Unsalted nuts, nut butters, and seeds. Unsalted canned beans. Lean cuts of beef with fat trimmed off. Low-sodium, lean precooked or cured meat, such as sausages or meat loaves. Dairy Low-fat (1%) or fat-free (skim) milk. Reduced-fat, low-fat, or fat-free cheeses. Nonfat, low-sodium ricotta or cottage cheese. Low-fat or nonfat yogurt. Low-fat, low-sodium cheese. Fats and oils Soft margarine without trans fats. Vegetable oil. Reduced-fat, low-fat, or light mayonnaise and salad dressings (reduced-sodium). Canola, safflower, olive, avocado, soybean, and sunflower oils. Avocado. Seasonings and condiments Herbs. Spices. Seasoning mixes without salt. Other foods Unsalted popcorn and pretzels. Fat-free sweets. The items listed above may not be a complete list of foods and beverages you can eat. Contact a dietitian for more information. What foods should I avoid? Fruits Canned fruit in a light or heavy syrup. Fried fruit. Fruit in cream or butter sauce. Vegetables Creamed or fried vegetables. Vegetables in a cheese sauce. Regular canned vegetables (not low-sodium or reduced-sodium). Regular canned tomato sauce and paste (not low-sodium or  reduced-sodium). Regular tomato and vegetable juice (not low-sodium or reduced-sodium). Angie Fava. Olives. Grains Baked goods made with fat, such as croissants, muffins, or some breads. Dry pasta or rice meal packs. Meats and other proteins Fatty cuts of meat. Ribs. Fried meat. Berniece Salines. Bologna, salami, and other precooked or cured meats, such as sausages or meat loaves. Fat from the back of a pig (fatback). Bratwurst. Salted nuts and seeds. Canned beans with added salt. Canned or smoked fish. Whole eggs or egg yolks. Chicken or [place] with skin. Dairy Whole or 2% milk, cream, and half-and-half. Whole or full-fat cream cheese. Whole-fat or sweetened yogurt. Full-fat cheese. Nondairy creamers. Whipped toppings. Processed cheese and cheese spreads. Fats and oils  Butter. Stick margarine. Lard. Shortening. Ghee. Bacon fat. Tropical oils, such as coconut, palm kernel, or palm oil. Seasonings and condiments Onion salt, garlic salt, seasoned salt, table salt, and sea salt. Worcestershire sauce. Tartar sauce. Barbecue sauce. Teriyaki sauce. Soy sauce, including reduced-sodium. Steak sauce. Canned and packaged gravies. Fish sauce. Oyster sauce. Cocktail sauce. Store-bought horseradish. Ketchup. Mustard. Meat flavorings and tenderizers. Bouillon cubes. Hot sauces. Pre-made or packaged marinades. Pre-made or packaged taco seasonings. Relishes. Regular salad dressings. Other foods Salted popcorn and pretzels. The items listed above may not be a complete list of foods and beverages you should avoid. Contact a dietitian for more information. Where to find more information National Heart, Lung, and Blood Institute: https://wilson-eaton.com/ American Heart Association: www.heart.org Academy of Nutrition and Dietetics: www.eatright.[place]: www.kidney.org Summary The DASH eating plan is a healthy eating plan that has been shown to reduce high blood pressure (hypertension). It may also reduce your risk  for type 2 diabetes, heart disease, and stroke. When on the DASH eating plan, aim to eat more fresh fruits and vegetables, whole grains, lean proteins, low-fat dairy, and heart-healthy fats. With the DASH eating plan, you should limit salt (sodium) intake to 2,300 mg a day. If you have hypertension, you may need to reduce your sodium intake to 1,500 mg a day. Work with your health care provider or dietitian to adjust your eating plan to your individual calorie needs. This information is not intended to replace advice given to you by your health care provider. Make sure you discuss any questions you have with your health care provider. Document Revised: 06/09/2019 Document Reviewed: 06/09/2019 Elsevier Patient Education  2022 [place].  Olmesartan Tablets What is this medication? OLMESARTAN (all mi SAR tan) is an angiotensin II receptor blocker, also known as an ARB. It treats high blood pressure. This medicine may be used for other purposes; ask your health care provider or pharmacist if you have questions. COMMON BRAND NAME(S): Benicar What should I tell my care team before I take this medication? They need to know if you have any of these conditions: if you are on a special diet, such as a low-salt diet kidney or liver disease an unusual or allergic reaction to olmesartan, other medicines, foods, dyes, or preservatives pregnant or trying to get pregnant breast-feeding How should I use this medication? Take this drug by mouth. Take it as directed on the prescription label at the same time every day. You can take it with or without food. If it upsets your stomach, take it with food. Keep taking it unless your health care provider tells you to stop. Talk to your health care provider about the use of this drug in children. While it may be prescribed for children as young as 6 for selected conditions, precautions do apply. Overdosage: If you think you have taken too much of this medicine  contact a poison control center or emergency room at once. NOTE: This medicine is only for you. Do not share this medicine with others. What if I miss a dose? If you miss a dose, take it as soon as you can. If it is almost time for your next dose, take only that dose. Do not take double or extra doses. What may interact with this medication? blood pressure medicines diuretics, especially triamterene, spironolactone or amiloride potassium salts or potassium supplements This list may not describe all possible interactions. Give your health care provider a list of all the medicines, herbs, non-prescription drugs, or  dietary supplements you use. Also tell them if you smoke, drink alcohol, or use illegal drugs. Some items may interact with your medicine. What should I watch for while using this medication? Visit your doctor or health care professional for regular checks on your progress. Check your blood pressure as directed. Ask your doctor or health care professional what your blood pressure should be and when you should contact him or her. Call your doctor or health care professional if you notice an irregular or fast heart beat. Women should inform their doctor if they wish to become pregnant or think they might be pregnant. There is a potential for serious side effects to an unborn child, particularly in the second or third trimester. Talk to your health care professional or pharmacist for more information. You may get drowsy or dizzy. Do not drive, use machinery, or do anything that needs mental alertness until you know how this drug affects you. Do not stand or sit up quickly, especially if you are an older patient. This reduces the risk of dizzy or fainting spells. Alcohol can make you more drowsy and dizzy. Avoid alcoholic drinks. Avoid salt substitutes unless you are told otherwise by your doctor or health care professional. Do not treat yourself for coughs, colds, or pain while you are taking  this medicine without asking your doctor or health care professional for advice. Some ingredients may increase your blood pressure. What side effects may I notice from receiving this medication? Side effects that you should report to your doctor or health care professional as soon as possible: confusion, dizziness, light headedness or fainting spells decreased amount of urine passed diarrhea difficulty breathing or swallowing, hoarseness, or tightening of the throat fast or irregular heart beat, palpitations, or chest pain skin rash, itching swelling of your face, lips, tongue, hands, or feet vomiting weight loss Side effects that usually do not require medical attention (report to your doctor or health care professional if they continue or are bothersome): cough decreased sexual function or desire headache nasal congestion or stuffiness nausea sore or cramping muscles This list may not describe all possible side effects. Call your doctor for medical advice about side effects. You may report side effects to FDA at 1-800-FDA-1088. Where should I keep my medication? Keep out of the reach of children and pets. Store at room temperature between 20 and 25 degrees C (68 and 77 degrees F). Throw away any unused drug after the expiration date. NOTE: This sheet is a summary. It may not cover all possible information. If you have questions about this medicine, talk to your doctor, pharmacist, or health care provider.  2022 Elsevier/Gold Standard (2021-03-25 00:00:00)

## 2021-09-13 ENCOUNTER — Other Ambulatory Visit: Payer: Self-pay | Admitting: Internal Medicine

## 2021-09-13 DIAGNOSIS — J4520 Mild intermittent asthma, uncomplicated: Secondary | ICD-10-CM

## 2021-09-23 ENCOUNTER — Ambulatory Visit: Payer: Medicare HMO | Admitting: Internal Medicine

## 2021-10-07 ENCOUNTER — Other Ambulatory Visit: Payer: Medicare HMO

## 2021-10-10 ENCOUNTER — Other Ambulatory Visit: Payer: Self-pay

## 2021-10-10 ENCOUNTER — Other Ambulatory Visit (INDEPENDENT_AMBULATORY_CARE_PROVIDER_SITE_OTHER): Payer: Medicare HMO

## 2021-10-10 DIAGNOSIS — R7303 Prediabetes: Secondary | ICD-10-CM | POA: Diagnosis not present

## 2021-10-10 DIAGNOSIS — I10 Essential (primary) hypertension: Secondary | ICD-10-CM | POA: Diagnosis not present

## 2021-10-10 LAB — COMPREHENSIVE METABOLIC PANEL WITH GFR
ALT: 13 U/L (ref 0–35)
AST: 15 U/L (ref 0–37)
Albumin: 4.2 g/dL (ref 3.5–5.2)
Alkaline Phosphatase: 58 U/L (ref 39–117)
BUN: 16 mg/dL (ref 6–23)
CO2: 29 meq/L (ref 19–32)
Calcium: 9.4 mg/dL (ref 8.4–10.5)
Chloride: 105 meq/L (ref 96–112)
Creatinine, Ser: 0.99 mg/dL (ref 0.40–1.20)
GFR: 59.13 mL/min — ABNORMAL LOW
Glucose, Bld: 95 mg/dL (ref 70–99)
Potassium: 4.1 meq/L (ref 3.5–5.1)
Sodium: 140 meq/L (ref 135–145)
Total Bilirubin: 0.4 mg/dL (ref 0.2–1.2)
Total Protein: 8 g/dL (ref 6.0–8.3)

## 2021-10-10 LAB — HEMOGLOBIN A1C: Hgb A1c MFr Bld: 6.4 % (ref 4.6–6.5)

## 2021-10-10 LAB — CBC WITH DIFFERENTIAL/PLATELET
Basophils Absolute: 0.1 K/uL (ref 0.0–0.1)
Basophils Relative: 0.5 % (ref 0.0–3.0)
Eosinophils Absolute: 0.3 K/uL (ref 0.0–0.7)
Eosinophils Relative: 3 % (ref 0.0–5.0)
HCT: 38.3 % (ref 36.0–46.0)
Hemoglobin: 12.8 g/dL (ref 12.0–15.0)
Lymphocytes Relative: 41.1 % (ref 12.0–46.0)
Lymphs Abs: 4.2 K/uL — ABNORMAL HIGH (ref 0.7–4.0)
MCHC: 33.4 g/dL (ref 30.0–36.0)
MCV: 81.1 fl (ref 78.0–100.0)
Monocytes Absolute: 0.7 K/uL (ref 0.1–1.0)
Monocytes Relative: 6.4 % (ref 3.0–12.0)
Neutro Abs: 5 K/uL (ref 1.4–7.7)
Neutrophils Relative %: 49 % (ref 43.0–77.0)
Platelets: 252 K/uL (ref 150.0–400.0)
RBC: 4.72 Mil/uL (ref 3.87–5.11)
RDW: 15.3 % (ref 11.5–15.5)
WBC: 10.2 K/uL (ref 4.0–10.5)

## 2021-10-10 LAB — LIPID PANEL
Cholesterol: 154 mg/dL (ref 0–200)
HDL: 54.7 mg/dL
LDL Cholesterol: 84 mg/dL (ref 0–99)
NonHDL: 99
Total CHOL/HDL Ratio: 3
Triglycerides: 77 mg/dL (ref 0.0–149.0)
VLDL: 15.4 mg/dL (ref 0.0–40.0)

## 2021-10-14 ENCOUNTER — Other Ambulatory Visit: Payer: Self-pay | Admitting: Internal Medicine

## 2021-10-14 ENCOUNTER — Ambulatory Visit (INDEPENDENT_AMBULATORY_CARE_PROVIDER_SITE_OTHER): Payer: Medicare HMO | Admitting: Internal Medicine

## 2021-10-14 ENCOUNTER — Encounter: Payer: Self-pay | Admitting: Internal Medicine

## 2021-10-14 ENCOUNTER — Other Ambulatory Visit: Payer: Self-pay

## 2021-10-14 VITALS — BP 130/70 | HR 101 | Temp 98.5°F | Ht 64.0 in | Wt 226.0 lb

## 2021-10-14 DIAGNOSIS — D72820 Lymphocytosis (symptomatic): Secondary | ICD-10-CM

## 2021-10-14 DIAGNOSIS — F32A Depression, unspecified: Secondary | ICD-10-CM

## 2021-10-14 DIAGNOSIS — H04203 Unspecified epiphora, bilateral lacrimal glands: Secondary | ICD-10-CM

## 2021-10-14 DIAGNOSIS — F419 Anxiety disorder, unspecified: Secondary | ICD-10-CM

## 2021-10-14 DIAGNOSIS — I10 Essential (primary) hypertension: Secondary | ICD-10-CM | POA: Diagnosis not present

## 2021-10-14 DIAGNOSIS — R69 Illness, unspecified: Secondary | ICD-10-CM | POA: Diagnosis not present

## 2021-10-14 MED ORDER — DILTIAZEM HCL ER COATED BEADS 360 MG PO CP24: 360.0000 mg | ORAL_CAPSULE | Freq: Every day | ORAL | 3 refills | Status: DC

## 2021-10-14 MED ORDER — PATADAY 0.7 % OP SOLN: 1.0000 [drp] | Freq: Every day | OPHTHALMIC | 11 refills | Status: DC | PRN

## 2021-10-14 MED ORDER — CITALOPRAM HYDROBROMIDE 10 MG PO TABS: 10.0000 mg | ORAL_TABLET | Freq: Every day | ORAL | 3 refills | Status: DC

## 2021-10-14 MED ORDER — OLMESARTAN MEDOXOMIL 20 MG PO TABS: 10.0000 mg | ORAL_TABLET | Freq: Every day | ORAL | 3 refills | Status: DC

## 2021-10-14 NOTE — Progress Notes (Signed)
 Chief Complaint  Cheryl Coleman presents with   Follow-up   Results   F/u  1. Htn on dilt 240 cd and benicar  5 mg qd bp still 140s-160s at times 120s/70s-90s hr 61 to 90s today 101 and having palpitations rec f/u with cards  2. Palpitations rec f/u cards 3. Anxiety/depression on prn xanax   4. Watery eyes   Review of Systems  Constitutional:  Negative for weight loss.  HENT:  Negative for hearing loss.   Eyes:  Negative for blurred vision.  Respiratory:  Negative for shortness of breath.   Cardiovascular:  Positive for palpitations. Negative for chest pain.  Gastrointestinal:  Negative for abdominal pain and blood in stool.  Genitourinary:  Negative for dysuria.  Musculoskeletal:  Negative for falls and joint pain.  Skin:  Negative for rash.  Neurological:  Negative for headaches.  Psychiatric/Behavioral:  Negative for depression. The Cheryl Coleman is nervous/anxious.   Past Medical History:  Diagnosis Date   Arthritis    Asthma    COVID    COVID-19    07/24/20, 05/19/21   Diverticulitis    GERD (gastroesophageal reflux disease)    Hiatal hernia    History of blood transfusion    History of chicken pox    History of GI bleed    2013/14   Hyperlipidemia    Hypertension    Leukocytosis    Past Surgical History:  Procedure Laterality Date   BREAST CYST ASPIRATION Right    CESAREAN SECTION     04/27/84   COLONOSCOPY     ESOPHAGOGASTRODUODENOSCOPY (EGD) WITH PROPOFOL  N/A 08/29/2020   Procedure: ESOPHAGOGASTRODUODENOSCOPY (EGD) WITH PROPOFOL ;  Surgeon: Unk Corinn Skiff, MD;  Location: [facility];  Service: Gastroenterology;  Laterality: N/A;   ORIF ACETABULAR FRACTURE     Family History  Problem Relation Age of Onset   Hypertension Maternal Aunt    Diabetes Maternal Aunt    Breast cancer Neg Hx    Social History   Socioeconomic History   Marital status: Married    Spouse name: Not on file   Number of children: Not on file   Years of education: Not on file   Highest  education level: Not on file  Occupational History   Not on file  Tobacco Use   Smoking status: Never   Smokeless tobacco: Never  Vaping Use   Vaping Use: Never used  Substance and Sexual Activity   Alcohol use: Not Currently   Drug use: Not Currently   Sexual activity: Yes    Comment: husband   Other Topics Concern   Not on file  Social History Narrative   Lived in [place] from [place]    Married 1st husband died in late 90s/2000s   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, wears seat belt, safe in relationship    Social Determinants of Health   Financial Resource Strain: Not on file  Food Insecurity: Not on file  Transportation Needs: Not on file  Physical Activity: Not on file  Stress: Not on file  Social Connections: Not on file  Intimate Partner Violence: Not on file   Current Meds  Medication Sig   Acetaminophen  (TYLENOL  PO) Take by mouth.   albuterol  (VENTOLIN  HFA) 108 (90 Base) MCG/ACT inhaler INHALE 1-2 PUFFS BY MOUTH EVERY 6 (SIX) HOURS AS NEEDED   ALPRAZolam  (XANAX ) 0.25 MG tablet Take 0.5-1 tablets (0.125-0.25 mg total) by mouth daily as needed for anxiety.   Ascorbic Acid (VITAMIN C  PO) Take by mouth daily.   atorvastatin  (LIPITOR) 10 MG tablet TAKE 1 TABLET (10 MG TOTAL) BY MOUTH DAILY AT 6 PM.   CALCIUM  PO Take by mouth daily.   citalopram  (CELEXA ) 10 MG tablet Take 1 tablet (10 mg total) by mouth daily.   fluticasone  (FLONASE ) 50 MCG/ACT nasal spray SPRAY 2 SPRAYS INTO EACH NOSTRIL EVERY DAY   Loratadine (CLARITIN PO) Take by mouth as needed.   montelukast  (SINGULAIR ) 10 MG tablet TAKE 1 TABLET BY MOUTH EVERYDAY AT BEDTIME   Multiple Vitamins-Minerals (ZINC PO) Take by mouth daily.   Olopatadine  HCl (PATADAY ) 0.7 % SOLN Apply 1 drop to eye daily as needed.   omeprazole  (PRILOSEC) 40 MG capsule Take 1 capsule (40 mg total) by mouth 2 (two) times daily.   Specialty Vitamins Products (WOMENS VITA PAK PO) Take by mouth daily.   VITAMIN D  PO Take by mouth daily.    [DISCONTINUED] diltiazem  (CARDIZEM  CD) 240 MG 24 hr capsule Take 1 capsule (240 mg total) by mouth daily.   [DISCONTINUED] olmesartan  (BENICAR ) 5 MG tablet Take 1 tablet (5 mg total) by mouth daily. At night   Allergies  Allergen Reactions   Oxycodone Other (See Comments)    Feels bad   Recent Results (from the past 2160 hour(s))  Flow cytometry panel-leukemia/lymphoma work-up     Status: Cheryl Coleman   Collection Time: 07/25/21 11:45 AM  Result Value Ref Range   PATH INTERP XXX-IMP Comment     Comment: (NOTE) 1)  Absolute increase in B cells detected 2)  Absolute eosinophilia (See Comment.)    ANNOTATION COMMENT IMP Comment     Comment: (NOTE) 1)  The differential diagnosis includes persistent polyclonal B lymphocytosis, a reactive process or biclonal lymphoproliferative disorder with a nonspecific phenotype. Clinical correlation is recommended. If clinically indicated, B cell genotyping by PCR is available to confirm or exclude a clonal B lymphocyte population. 2)  Eosinophilia may be associated with drug reactions, parasitic infections, and neoplastic processes, among others. Recommend clinical correlation and follow up as appropriate.    CLINICAL INFO Comment     Comment: (NOTE) Accompanying CBC dated 07/25/2021 shows: WBC count 10.0, Neu 4.6, Lym 4.1, Mon 0.8, Eos 0.5.    Specimen Type Comment     Comment: Peripheral blood   ASSESSMENT OF LEUKOCYTES Comment     Comment: (NOTE) No monoclonal B cell population is detected. kappa:lambda ratio 1.8 There is no loss of, or aberrant expression of, the pan T cell antigens to suggest a neoplastic T cell process. An increased CD4/T helper to CD8/T suppressor cell ratio is detected. CD4:CD8 ratio 6.1 No circulating blasts are detected. There is no immunophenotypic evidence of abnormal myeloid maturation. Analysis of the leukocyte population shows: granulocytes 65% (including 5% eosinophils), monocytes 5%, lymphocytes 30%, blasts  <0.1%, B cells 8%, T cells 19%, NK cells 3%.    % Viable Cells Comment     Comment: 94%   ANALYSIS AND GATING STRATEGY Comment     Comment: 8 color analysis with CD45/SSC gating   IMMUNOPHENOTYPING STUDY Comment     Comment: (NOTE) CD2       Normal         CD3       Normal CD4       Normal         CD5       Normal CD7       Normal         CD8  Normal CD10      Normal         CD11b     Normal CD13      Normal         CD14      Normal CD16      Normal         CD19      Normal CD20      Normal         CD33      Normal CD34      Normal         CD38      Normal CD45      Normal         CD56      Normal CD57      Normal         CD117     Normal HLA-DR    Normal         KAPPA     Normal LAMBDA    Normal         CD64      Normal    PATHOLOGIST NAME Comment     Comment: Clotilda Sake, M.D.   COMMENT: Comment     Comment: (NOTE) Each antibody in this assay was utilized to assess for potential abnormalities of studied cell populations or to characterize identified abnormalities. This test was developed and its performance characteristics determined by [facility].  It has not been cleared or approved by the U.S. Food and Drug Administration. The FDA has determined that such clearance or approval is not necessary. This test is used for clinical purposes.  It should not be regarded as investigational or for research. Performed At: [facility] [address] [place], [place] [id number] Loran Gales MDPhD Ey:[phone] Performed At: [facility] [address] [place], [place] [id number] Loran Gales MDPhD Ey:[phone]   CBC with Differential/Platelet     Status: Abnormal   Collection Time: 07/25/21 11:45 AM  Result Value Ref Range   WBC 10.0 4.0 - 10.5 K/uL   RBC 4.76 3.87 - 5.11 MIL/uL   Hemoglobin 13.3 12.0 - 15.0 g/dL   HCT 63.0 63.9 - 53.9 %   MCV 77.5 (L) 80.0 - 100.0 fL   MCH 27.9 26.0 - 34.0 pg   MCHC 36.0 30.0 - 36.0 g/dL   RDW 84.3 (H) 88.4 - 84.4 %    Platelets 255 150 - 400 K/uL   nRBC 0.0 0.0 - 0.2 %   Neutrophils Relative % 45 %   Neutro Abs 4.6 1.7 - 7.7 K/uL   Lymphocytes Relative 41 %   Lymphs Abs 4.1 (H) 0.7 - 4.0 K/uL   Monocytes Relative 8 %   Monocytes Absolute 0.8 0.1 - 1.0 K/uL   Eosinophils Relative 5 %   Eosinophils Absolute 0.5 0.0 - 0.5 K/uL   Basophils Relative 1 %   Basophils Absolute 0.1 0.0 - 0.1 K/uL   Immature Granulocytes 0 %   Abs Immature Granulocytes 0.01 0.00 - 0.07 K/uL    Comment: Performed at [facility], [address] [place], [place] [address]  Novel Coronavirus, NAA ([facility])     Status: Cheryl Coleman   Collection Time: 07/29/21  3:29 PM   Specimen: Nasopharyngeal(NP) swabs in vial transport medium   Nasopharynge  Previous  Result Value Ref Range   SARS-CoV-2, NAA Not Detected Not Detected    Comment: This nucleic acid amplification test was developed and  its performance characteristics determined by [facility]. Nucleic acid amplification tests include RT-PCR and TMA. This test has not been FDA cleared or approved. This test has been authorized by FDA under an Emergency Use Authorization (EUA). This test is only authorized for the duration of time the declaration that circumstances exist justifying the authorization of the emergency use of in vitro diagnostic tests for detection of SARS-CoV-2 virus and/or diagnosis of COVID-19 infection under section 564(b)(1) of the Act, 21 U.S.C. 639aaa-6(a) (1), unless the authorization is terminated or revoked sooner. When diagnostic testing is negative, the possibility of a false negative result should be considered in the context of a Cheryl Coleman's recent exposures and the presence of clinical signs and symptoms consistent with COVID-19. An individual without symptoms of COVID-19 and who is not shedding SARS-CoV-2 virus wo uld expect to have a negative (not detected) result in this assay.   SARS-COV-2, NAA 2 DAY TAT     Status: Cheryl Coleman   Collection  Time: 07/29/21  3:29 PM   Nasopharynge  Previous  Result Value Ref Range   SARS-CoV-2, NAA 2 DAY TAT Performed   POCT rapid strep A     Status: Normal   Collection Time: 07/29/21  3:38 PM  Result Value Ref Range   Rapid Strep A Screen Negative Negative  POC Influenza A&B (Binax test)     Status: Normal   Collection Time: 07/29/21  3:38 PM  Result Value Ref Range   Influenza A, POC Negative Negative   Influenza B, POC Negative Negative  Hemoglobin A1c     Status: Cheryl Coleman   Collection Time: 10/10/21  8:21 AM  Result Value Ref Range   Hgb A1c MFr Bld 6.4 4.6 - 6.5 %    Comment: Glycemic Control Guidelines for People with Diabetes:Non Diabetic:  <6%Goal of Therapy: <7%Additional Action Suggested:  >8%   Lipid panel     Status: Cheryl Coleman   Collection Time: 10/10/21  8:21 AM  Result Value Ref Range   Cholesterol 154 0 - 200 mg/dL    Comment: ATP III Classification       Desirable:  < 200 mg/dL               Borderline High:  200 - 239 mg/dL          High:  > = 759 mg/dL   Triglycerides 22.9 0.0 - 149.0 mg/dL    Comment: Normal:  <849 mg/dLBorderline High:  150 - 199 mg/dL   HDL 45.29 >60.99 mg/dL   VLDL 84.5 0.0 - 59.9 mg/dL   LDL Cholesterol 84 0 - 99 mg/dL   Total CHOL/HDL Ratio 3     Comment:                Men          Women1/2 Average Risk     3.4          3.3Average Risk          5.0          4.42X Average Risk          9.6          7.13X Average Risk          15.0          11.0                       NonHDL 99.00     Comment: NOTE:  Non-HDL goal  should be 30 mg/dL higher than Cheryl Coleman's LDL goal (i.e. LDL goal of < 70 mg/dL, would have non-HDL goal of < 100 mg/dL)  Comprehensive metabolic panel     Status: Abnormal   Collection Time: 10/10/21  8:21 AM  Result Value Ref Range   Sodium 140 135 - 145 mEq/L   Potassium 4.1 3.5 - 5.1 mEq/L   Chloride 105 96 - 112 mEq/L   CO2 29 19 - 32 mEq/L   Glucose, Bld 95 70 - 99 mg/dL   BUN 16 6 - 23 mg/dL   Creatinine, Ser 9.00 0.40 - 1.20 mg/dL    Total Bilirubin 0.4 0.2 - 1.2 mg/dL   Alkaline Phosphatase 58 39 - 117 U/L   AST 15 0 - 37 U/L   ALT 13 0 - 35 U/L   Total Protein 8.0 6.0 - 8.3 g/dL   Albumin 4.2 3.5 - 5.2 g/dL   GFR 40.86 (L) >39.99 mL/min    Comment: Calculated using the CKD-EPI Creatinine Equation (2021)   Calcium  9.4 8.4 - 10.5 mg/dL  CBC with Differential/Platelet     Status: Abnormal   Collection Time: 10/10/21  8:21 AM  Result Value Ref Range   WBC 10.2 4.0 - 10.5 K/uL   RBC 4.72 3.87 - 5.11 Mil/uL   Hemoglobin 12.8 12.0 - 15.0 g/dL   HCT 61.6 63.9 - 53.9 %   MCV 81.1 78.0 - 100.0 fl   MCHC 33.4 30.0 - 36.0 g/dL   RDW 84.6 88.4 - 84.4 %   Platelets 252.0 150.0 - 400.0 K/uL   Neutrophils Relative % 49.0 43.0 - 77.0 %   Lymphocytes Relative 41.1 12.0 - 46.0 %   Monocytes Relative 6.4 3.0 - 12.0 %   Eosinophils Relative 3.0 0.0 - 5.0 %   Basophils Relative 0.5 0.0 - 3.0 %   Neutro Abs 5.0 1.4 - 7.7 K/uL   Lymphs Abs 4.2 (H) 0.7 - 4.0 K/uL   Monocytes Absolute 0.7 0.1 - 1.0 K/uL   Eosinophils Absolute 0.3 0.0 - 0.7 K/uL   Basophils Absolute 0.1 0.0 - 0.1 K/uL   Objective  Body mass index is 38.79 kg/m. Wt Readings from Last 3 Encounters:  10/14/21 226 lb (102.5 kg)  09/12/21 226 lb (102.5 kg)  07/29/21 225 lb (102.1 kg)   Temp Readings from Last 3 Encounters:  10/14/21 98.5 F (36.9 C) (Oral)  09/12/21 98.2 F (36.8 C) (Oral)  07/25/21 98.6 F (37 C) (Tympanic)   BP Readings from Last 3 Encounters:  10/14/21 130/70  09/12/21 (!) 142/80  07/25/21 120/68   Pulse Readings from Last 3 Encounters:  10/14/21 (!) 101  09/12/21 72  07/25/21 96    Physical Exam Vitals and nursing note reviewed.  Constitutional:      Appearance: Normal appearance. She is well-developed and well-groomed.  HENT:     Head: Normocephalic and atraumatic.  Eyes:     Conjunctiva/sclera: Conjunctivae normal.     Pupils: Pupils are equal, round, and reactive to light.  Cardiovascular:     Rate and Rhythm: Regular  rhythm. Tachycardia present.     Heart sounds: Normal heart sounds. No murmur heard. Pulmonary:     Effort: Pulmonary effort is normal.     Breath sounds: Normal breath sounds.  Abdominal:     General: Abdomen is flat. Bowel sounds are normal.     Tenderness: There is no abdominal tenderness.  Musculoskeletal:        General: No tenderness.  Skin:    General: Skin is warm and dry.  Neurological:     General: No focal deficit present.     Mental Status: She is alert and oriented to person, place, and time. Mental status is at baseline.     Cranial Nerves: Cranial nerves 2-12 are intact.     Motor: Motor function is intact.     Coordination: Coordination is intact.     Gait: Gait is intact.  Psychiatric:        Attention and Perception: Attention and perception normal.        Mood and Affect: Mood and affect normal.        Speech: Speech normal.        Behavior: Behavior normal. Behavior is cooperative.        Thought Content: Thought content normal.        Cognition and Memory: Cognition and memory normal.        Judgment: Judgment normal.    Assessment  Plan  Watery eyes - Plan: Olopatadine  HCl (PATADAY ) 0.7 % SOLN  Essential hypertension - Plan: diltiazem  (CARDIZEM  CD) 360 MG 24 hr capsule inc from 240 , olmesartan  (BENICAR ) 10 increase from 5 mg qd  MG tablet, Basic Metabolic Panel (BMET) in 2 weeks Log Bp  Anxiety/depression - Plan: citalopram  (CELEXA ) 10 MG tablet Prn xanax     Lymphocytosis f/u Dr. Melanee Garfinkel See last note   Provider: Dr. Randine [name]-Internal Medicine

## 2021-10-14 NOTE — Patient Instructions (Addendum)
 [facility] as given the info before for both (both have virtual options)   [facility]  [address]  [place] [address] [phone]    [facility] [place]  [address] [place] [address]  [phone]   [facility]  -call and schedule   Address: [address] [place], [place] [address] Hours:  Closed ? Opens 9?AM Phone: [phone]   [facility] call to consider heart monitor for racing heart   MD Physician   Primary Contact Information  Phone Fax E-mail Address  [phone] Not available [address] [place] [address]    Palpitations Palpitations are feelings that your heartbeat is irregular or is faster than normal. It may feel like your heart is fluttering or skipping a beat. Palpitations may be caused by many things, including smoking, caffeine, alcohol, stress, and certain medicines or drugs. Most causes of palpitations are not serious.  However, some palpitations can be a sign of a serious problem. Further tests and a thorough medical history will be done to find the cause of your palpitations. Your provider may order tests such as an ECG, labs, an echocardiogram, or an ambulatory continuous ECG monitor. Follow these instructions at home: Pay attention to any changes in your symptoms. Let your health care provider know about them. Take these actions to help manage your symptoms: Eating and drinking Follow instructions from your health care provider about eating or drinking restrictions. You may need to avoid foods and drinks that may cause palpitations. These may include: Caffeinated coffee, tea, soft drinks, and energy drinks. Chocolate. Alcohol. Diet pills. Lifestyle   Take steps to reduce your stress and anxiety. Things that can help you relax include: Yoga. Mind-body activities, such as deep breathing, meditation, or using words  and images to create positive thoughts (guided imagery). Physical activity, such as swimming, jogging, or walking. Tell your health care provider if your palpitations increase with activity. If you have chest pain or shortness of breath with activity, do not continue the activity until you are seen by your health care provider. Biofeedback. This is a method that helps you learn to use your mind to control things in your body, such as your heartbeat. Get plenty of rest and sleep. Keep a regular bed time. Do not use drugs, including cocaine or ecstasy. Do not use marijuana. Do not use any products that contain nicotine or tobacco. These products include cigarettes, chewing tobacco, and vaping devices, such as e-cigarettes. If you need help quitting, ask your health care provider. General instructions Take over-the-counter and prescription medicines only as told by your health care provider. Keep all follow-up visits. This is important. These may include visits for further testing if palpitations do not go away or get worse. Contact a health care provider if: You continue to have a fast or irregular heartbeat for a long period of time. You notice that your palpitations occur more often. Get help right away if: You have chest pain or shortness of breath. You have a severe headache. You feel dizzy or you faint. These symptoms may represent a serious problem that is an emergency. Do not wait to see if the symptoms will go away. Get medical help right away. Call your local emergency services (911 in the [place] Do not drive yourself to the hospital. Summary Palpitations are feelings that your heartbeat is irregular or is faster than normal. It may feel  like your heart is fluttering or skipping a beat. Palpitations may be caused by many things, including smoking, caffeine, alcohol, stress, certain medicines, and drugs. Further tests and a thorough medical history may be done to find the cause of your  palpitations. Get help right away if you faint or have chest pain, shortness of breath, severe headache, or dizziness. This information is not intended to replace advice given to you by your health care provider. Make sure you discuss any questions you have with your health care provider. Document Revised: 11/27/2020 Document Reviewed: 11/27/2020 Elsevier Patient Education  2022 Elsevier Inc.  Citalopram  Tablets What is this medication? CITALOPRAM  (sye TAL oh pram) treats depression. It increases the amount of serotonin in the brain, a hormone that helps regulate mood. It belongs to a group of medications called SSRIs. This medicine may be used for other purposes; ask your health care provider or pharmacist if you have questions. COMMON BRAND NAME(S): Celexa  What should I tell my care team before I take this medication? They need to know if you have any of these conditions: Bipolar disorder or a family history of bipolar disorder Bleeding disorders Glaucoma Heart disease History of irregular heartbeat Kidney disease Liver disease Low levels of magnesium  or potassium in the blood Receiving electroconvulsive therapy Seizures Suicidal thoughts, plans, or attempt; a previous suicide attempt by you or a family member Take medications that treat or prevent blood clots Thyroid  disease An unusual or allergic reaction to citalopram , escitalopram, other medications, foods, dyes, or preservatives Pregnant or trying to become pregnant Breast-feeding How should I use this medication? Take this medication by mouth with a glass of water. Follow the directions on the prescription label. You can take it with or without food. Take your medication at regular intervals. Do not take your medication more often than directed. Do not stop taking this medication suddenly except upon the advice of your care team. Stopping this medication too quickly may cause serious side effects or your condition may  worsen. A special MedGuide will be given to you by the pharmacist with each prescription and refill. Be sure to read this information carefully each time. Talk to your care team about the use of this medication in children. Special care may be needed. Patients over 66 years old may have a stronger reaction and need a smaller dose. Overdosage: If you think you have taken too much of this medicine contact a poison control center or emergency room at once. NOTE: This medicine is only for you. Do not share this medicine with others. What if I miss a dose? If you miss a dose, take it as soon as you can. If it is almost time for your next dose, take only that dose. Do not take double or extra doses. What may interact with this medication? Do not take this medication with any of the following: Certain medications for fungal infections like fluconazole , itraconazole, ketoconazole, posaconazole, voriconazole Cisapride Dronedarone Escitalopram Linezolid MAOIs like Carbex, Eldepryl, Marplan, Nardil, and Parnate Methylene blue (injected into a vein) Pimozide Thioridazine This medication may also interact with the following: Alcohol Amphetamines Aspirin  and aspirin -like medications Carbamazepine Certain medications for depression, anxiety, or psychotic disturbances Certain medications for infections like chloroquine, clarithromycin, erythromycin, furazolidone, isoniazid , pentamidine Certain medications for migraine headaches like almotriptan, eletriptan, frovatriptan, naratriptan, rizatriptan, sumatriptan, zolmitriptan Certain medications for sleep Certain medications that treat or prevent blood clots like dalteparin, enoxaparin, warfarin Cimetidine Diuretics Dofetilide Fentanyl  Lithium Methadone Metoprolol  NSAIDs, medications for pain and inflammation,  like ibuprofen or naproxen Omeprazole  Other medications that prolong the QT interval (cause an abnormal heart  rhythm) Procarbazine Rasagiline Supplements like St. John's wort, kava kava, valerian Tramadol Tryptophan Ziprasidone This list may not describe all possible interactions. Give your health care provider a list of all the medicines, herbs, non-prescription drugs, or dietary supplements you use. Also tell them if you smoke, drink alcohol, or use illegal drugs. Some items may interact with your medicine. What should I watch for while using this medication? Tell your care team if your symptoms do not get better or if they get worse. Visit your care team for regular checks on your progress. Because it may take several weeks to see the full effects of this medication, it is important to continue your treatment as prescribed. Watch for new or worsening thoughts of suicide or depression. This includes sudden changes in mood, behavior, or thoughts. These changes can happen at any time but are more common in the beginning of treatment or after a change in dose. Call your care team right away if you experience these thoughts or worsening depression. Manic episodes may happen in patients with bipolar disorder who take this medication. Watch for changes in feelings or behaviors such as feeling anxious, nervous, agitated, panicky, irritable, hostile, aggressive, impulsive, severely restless, overly excited and hyperactive, or trouble sleeping. These symptoms can happen at anytime but are more common in the beginning of treatment or after a change in dose. Call you care team right away if you notice any of these symptoms. You may get drowsy or dizzy. Do not drive, use machinery, or do anything that needs mental alertness until you know how this medication affects you. Do not stand or sit up quickly, especially if you are an older patient. This reduces the risk of dizzy or fainting spells. Alcohol may interfere with the effect of this medication. Avoid alcoholic drinks. Your mouth may get dry. Chewing sugarless gum or  sucking hard candy, and drinking plenty of water may help. Contact your care team if the problem does not go away or is severe. What side effects may I notice from receiving this medication? Side effects that you should report to your care team as soon as possible: Allergic reactions--skin rash, itching, hives, swelling of the face, lips, tongue, or throat Bleeding--bloody or black, tar-like stools, red or dark brown urine, vomiting blood or brown material that looks like coffee grounds, small, red or purple spots on skin, unusual bleeding or bruising Heart rhythm changes--fast or irregular heartbeat, dizziness, feeling faint or lightheaded, chest pain, trouble breathing Low sodium level--muscle weakness, fatigue, dizziness, headache, confusion Serotonin syndrome--irritability, confusion, fast or irregular heartbeat, muscle stiffness, twitching muscles, sweating, high fever, seizure, chills, vomiting, diarrhea Sudden eye pain or change in vision such as blurry vision, seeing halos around lights, vision loss Thoughts of suicide or self-harm, worsening mood, feelings of depression Side effects that usually do not require medical attention (report to your care team if they continue or are bothersome): Change in sex drive or performance Diarrhea Dry mouth Excessive sweating Nausea Tremors or shaking Upset stomach This list may not describe all possible side effects. Call your doctor for medical advice about side effects. You may report side effects to FDA at 1-800-FDA-1088. Where should I keep my medication? Keep out of reach of children and pets. Store at room temperature between 15 and 30 degrees C (59 and 86 degrees F). Throw away any unused medication after the expiration date. NOTE: This sheet  is a summary. It may not cover all possible information. If you have questions about this medicine, talk to your doctor, pharmacist, or health care provider.  2022 Elsevier/Gold Standard (2020-07-08  00:00:00)

## 2021-10-15 ENCOUNTER — Other Ambulatory Visit: Payer: Self-pay | Admitting: Internal Medicine

## 2021-10-15 DIAGNOSIS — I10 Essential (primary) hypertension: Secondary | ICD-10-CM

## 2021-10-15 MED ORDER — DILTIAZEM HCL ER COATED BEADS 360 MG PO CP24: ORAL_CAPSULE | ORAL | 3 refills | Status: DC

## 2021-10-16 ENCOUNTER — Ambulatory Visit: Payer: Medicare HMO | Admitting: Internal Medicine

## 2021-10-23 ENCOUNTER — Ambulatory Visit (INDEPENDENT_AMBULATORY_CARE_PROVIDER_SITE_OTHER): Payer: Medicare HMO

## 2021-10-23 ENCOUNTER — Ambulatory Visit: Payer: Medicare HMO | Admitting: Cardiology

## 2021-10-23 ENCOUNTER — Encounter: Payer: Self-pay | Admitting: Cardiology

## 2021-10-23 VITALS — BP 120/66 | HR 84 | Ht 64.0 in | Wt 224.0 lb

## 2021-10-23 DIAGNOSIS — R002 Palpitations: Secondary | ICD-10-CM | POA: Diagnosis not present

## 2021-10-23 DIAGNOSIS — I10 Essential (primary) hypertension: Secondary | ICD-10-CM

## 2021-10-23 DIAGNOSIS — E7800 Pure hypercholesterolemia, unspecified: Secondary | ICD-10-CM

## 2021-10-23 NOTE — Patient Instructions (Signed)
Medication Instructions:  ?No changes at this time.  ? ?*If you need a refill on your cardiac medications before your next appointment, please call your pharmacy* ? ? ?Lab Work: ?None ? ?If you have labs (blood work) drawn today and your tests are completely normal, you will receive your results only by: ?MyChart Message (if you have MyChart) OR ?A paper copy in the mail ?If you have any lab test that is abnormal or we need to change your treatment, we will call you to review the results. ? ? ?Testing/Procedures: ?Your provider has ordered a heart monitor to wear for 7 days. This will be mailed to your home with instructions on placement. Once you have finished the time frame requested, you will return monitor in box provided. ? ? ? ? ? ?Follow-Up: ?At [facility], you and your health needs are our priority.  As part of our continuing mission to provide you with exceptional heart care, we have created designated Provider Care Teams.  These Care Teams include your primary Cardiologist (physician) and Advanced Practice Providers (APPs -  Physician Assistants and Nurse Practitioners) who all work together to provide you with the care you need, when you need it. ? ? ?Your next appointment:   ?6 week(s) ? ?The format for your next appointment:   ?In Person ? ?Provider:   ?Debbe Odea, MD ?

## 2021-10-23 NOTE — Progress Notes (Signed)
 " Cardiology Office Note:    Date:  10/23/2021   ID:  Cheryl Coleman, DOB 07-19-1956, MRN [id number]  PCP:  Cheryl Coleman SAILOR, MD  [facility] Cardiologist:  Redell Cave, MD  [facility] Electrophysiologist:  None   Referring MD: Cheryl Coleman *   Chief Complaint  Patient presents with   OTher    12 month follow up -- Patient c.o fast HR and increased BP. Meds reviewed verbally with patient.      History of Present Illness:    Cheryl Coleman is a 66 y.o. female with a hx of hypertension, hyperlipidemia, anxiety who presents for follow-up.    She was last seen due to symptoms of palpitations.  Cardiac monitor was placed, occasional SVT and no other significant arrhythmias noted.  Has also noticed elevated blood pressures of late, Cardizem  dose was increased to 360mg  daily.  Endorse to having symptoms of palpitations usually at night occurring almost daily.  She is going through some stressors at home, has some financial costs, new job.  Thinks anxiety may be contributing to symptoms.  Prior notes Echocardiogram 08/2020 EF 60 to 65%, diastolic function normal. Cardiac monitor on 09/02/2020 paroxysmal SVT,    Past Medical History:  Diagnosis Date   Arthritis    Asthma    COVID    COVID-19    07/24/20, 05/19/21   Diverticulitis    GERD (gastroesophageal reflux disease)    Hiatal hernia    History of blood transfusion    History of chicken pox    History of GI bleed    2013/14   Hyperlipidemia    Hypertension    Leukocytosis     Past Surgical History:  Procedure Laterality Date   BREAST CYST ASPIRATION Right    CESAREAN SECTION     04/27/84   COLONOSCOPY     ESOPHAGOGASTRODUODENOSCOPY (EGD) WITH PROPOFOL  N/A 08/29/2020   Procedure: ESOPHAGOGASTRODUODENOSCOPY (EGD) WITH PROPOFOL ;  Surgeon: Unk Corinn Skiff, MD;  Location: [facility] ENDOSCOPY;  Service: Gastroenterology;  Laterality: N/A;   ORIF ACETABULAR FRACTURE      Current Medications: Current  Meds  Medication Sig   Acetaminophen  (TYLENOL  PO) Take by mouth.   albuterol  (VENTOLIN  HFA) 108 (90 Base) MCG/ACT inhaler INHALE 1-2 PUFFS BY MOUTH EVERY 6 (SIX) HOURS AS NEEDED   ALPRAZolam  (XANAX ) 0.25 MG tablet Take 0.5-1 tablets (0.125-0.25 mg total) by mouth daily as needed for anxiety.   Ascorbic Acid (VITAMIN C PO) Take by mouth daily.   atorvastatin  (LIPITOR) 10 MG tablet TAKE 1 TABLET (10 MG TOTAL) BY MOUTH DAILY AT 6 PM.   CALCIUM  PO Take by mouth daily.   citalopram  (CELEXA ) 10 MG tablet Take 1 tablet (10 mg total) by mouth daily.   diltiazem  (CARDIZEM  CD) 360 MG 24 hr capsule TAKE 1 CAPSULE (360 MG TOTAL) BY MOUTH DAILY.   fluticasone  (FLONASE ) 50 MCG/ACT nasal spray SPRAY 2 SPRAYS INTO EACH NOSTRIL EVERY DAY   Loratadine (CLARITIN PO) Take by mouth as needed.   montelukast  (SINGULAIR ) 10 MG tablet TAKE 1 TABLET BY MOUTH EVERYDAY AT BEDTIME   Multiple Vitamins-Minerals (ZINC PO) Take by mouth daily.   olmesartan  (BENICAR ) 20 MG tablet Take 0.5 tablets (10 mg total) by mouth daily. At night   Olopatadine  HCl (PATADAY ) 0.7 % SOLN Apply 1 drop to eye daily as needed.   omeprazole  (PRILOSEC) 40 MG capsule Take 1 capsule (40 mg total) by mouth 2 (two) times daily.   Specialty Vitamins Products (WOMENS VITA PAK PO)  Take by mouth daily.   VITAMIN D  PO Take by mouth daily.     Allergies:   Oxycodone   Social History   Socioeconomic History   Marital status: Married    Spouse name: Not on file   Number of children: Not on file   Years of education: Not on file   Highest education level: Not on file  Occupational History   Not on file  Tobacco Use   Smoking status: Never   Smokeless tobacco: Never  Vaping Use   Vaping Use: Never used  Substance and Sexual Activity   Alcohol use: Not Currently   Drug use: Not Currently   Sexual activity: Yes    Comment: husband   Other Topics Concern   Not on file  Social History Narrative   Lived in [place] from [place]    Married  1st husband died in late 90s/2000s   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 There is no history of Breast cancer.  ROS:   Please see the history of present illness.     All other systems reviewed and are negative.  EKGs/Labs/Other Studies Reviewed:    The following studies were reviewed today:   EKG:  EKG is ordered today.  EKG shows normal sinus rhythm.  Recent Labs: 12/13/2020: TSH 2.29 10/10/2021: ALT 13; BUN 16; Creatinine, Ser 0.99; Hemoglobin 12.8; Platelets 252.0; Potassium 4.1; Sodium 140  Recent Lipid Panel    Component Value Date/Time   CHOL 154 10/10/2021 0821   TRIG 77.0 10/10/2021 0821   HDL 54.70 10/10/2021 0821   CHOLHDL 3 10/10/2021 0821   VLDL 15.4 10/10/2021 0821   LDLCALC 84 10/10/2021 0821     Risk Assessment/Calculations:      Physical Exam:    VS:  BP 120/66 (BP Location: Left Arm, Patient Position: Sitting, Cuff Size: Large)   Pulse 84   Ht 5' 4 (1.626 m)   Wt 224 lb (101.6 kg)   SpO2 98%   BMI 38.45 kg/m     Wt Readings from Last 3 Encounters:  10/23/21 224 lb (101.6 kg)  10/14/21 226 lb (102.5 kg)  09/12/21 226 lb (102.5 kg)     GEN:  Well nourished, well developed in no acute distress HEENT: Normal NECK: No JVD; No carotid bruits LYMPHATICS: No lymphadenopathy CARDIAC: RRR, no murmurs, rubs, gallops RESPIRATORY:  Clear to auscultation without rales, wheezing or rhonchi  ABDOMEN: Soft, non-tender, non-distended MUSCULOSKELETAL:  No edema; No deformity  SKIN: Warm and dry NEUROLOGIC:  Alert and oriented x 3 PSYCHIATRIC:  Normal affect   ASSESSMENT:     1. Palpitations   2. Pure hypercholesterolemia   3. Primary hypertension     PLAN:    In order of problems listed above:  Palpitations, prior cardiac monitor with occasional SVTs.  Advised to take Cardizem  360 mg in the evening.  Will place cardiac monitor x1 week.  Complaining of anxiety likely contributing.  Depending on findings on monitor, may consider adding beta-blocker but treatment of underlying anxiety will be helpful.  Hyperlipidemia, continue statin. Hypertension, BP controlled.  Cardizem , Benicar .  Follow-up in 4 to 6 weeks    Medication Adjustments/Labs and Tests Ordered: Current medicines are reviewed at length with the patient today.  Concerns regarding medicines are outlined above.  Orders Placed This Encounter  Procedures   LONG TERM MONITOR (3-14 DAYS)   EKG 12-Lead   No orders of the defined types were placed in this encounter.   Patient Instructions  Medication Instructions:  No changes at this time.   *If you need a refill on your cardiac medications before your next appointment, please call your pharmacy*   Lab Work: None  If you have labs (blood work) drawn today and your tests are completely normal, you will receive your results only by: MyChart Message (if you have MyChart) OR A paper copy in the mail If you have any lab test that is abnormal or we need to change your treatment, we will call you to review the results.   Testing/Procedures: Your provider has ordered a heart monitor to wear for 7 days. This will be mailed to your home with instructions on placement. Once you have finished the time frame requested, you will return monitor in box provided.      Follow-Up: At [facility], you and your health needs are our priority.  As part of our continuing mission to provide you with exceptional heart care, we have created designated Provider Care Teams.  These Care Teams include your primary Cardiologist (physician) and Advanced Practice  Providers (APPs -  Physician Assistants and Nurse Practitioners) who all work together to provide you with the care you need, when you need it.   Your next appointment:   6 week(s)  The format for your next appointment:   In Person  Provider:   Redell Cave, MD   Signed, Redell Cave, MD  10/23/2021 11:59 AM    [facility] "

## 2021-10-27 ENCOUNTER — Other Ambulatory Visit (INDEPENDENT_AMBULATORY_CARE_PROVIDER_SITE_OTHER): Payer: Medicare HMO

## 2021-10-27 DIAGNOSIS — D72820 Lymphocytosis (symptomatic): Secondary | ICD-10-CM

## 2021-10-27 DIAGNOSIS — I10 Essential (primary) hypertension: Secondary | ICD-10-CM

## 2021-10-27 LAB — BASIC METABOLIC PANEL WITH GFR
BUN: 11 mg/dL (ref 6–23)
CO2: 26 meq/L (ref 19–32)
Calcium: 9.8 mg/dL (ref 8.4–10.5)
Chloride: 103 meq/L (ref 96–112)
Creatinine, Ser: 0.9 mg/dL (ref 0.40–1.20)
GFR: 66.27 mL/min
Glucose, Bld: 98 mg/dL (ref 70–99)
Potassium: 3.9 meq/L (ref 3.5–5.1)
Sodium: 138 meq/L (ref 135–145)

## 2021-10-31 ENCOUNTER — Telehealth: Payer: Self-pay | Admitting: *Deleted

## 2021-10-31 NOTE — Telephone Encounter (Signed)
Called patient to see if she has received her Zio monitor. She has not, so advised I would call them with additional address.  ? ?Attempted to call [facility] customer service and it hung up on me. Sending [employer] representatives information to assist with this change.  ?

## 2021-11-05 DIAGNOSIS — R002 Palpitations: Secondary | ICD-10-CM | POA: Diagnosis not present

## 2021-11-07 ENCOUNTER — Telehealth: Payer: Self-pay | Admitting: Internal Medicine

## 2021-11-07 NOTE — Telephone Encounter (Signed)
The patient stated that she takes 10 mg of olmesartan (BENICAR) 20 MG tablet by splitting them in half. At the time the pharmacy did not have 10 mg tablets only 20 mg. Now the pharmacy has the Olmesartan 10mg . . The patient is requesting a new prescription to be sent to the pharmacy for the Olmesartan 10mg  ?

## 2021-11-10 ENCOUNTER — Other Ambulatory Visit: Payer: Self-pay | Admitting: Internal Medicine

## 2021-11-10 DIAGNOSIS — I10 Essential (primary) hypertension: Secondary | ICD-10-CM

## 2021-11-10 DIAGNOSIS — J301 Allergic rhinitis due to pollen: Secondary | ICD-10-CM | POA: Diagnosis not present

## 2021-11-10 DIAGNOSIS — K142 Median rhomboid glossitis: Secondary | ICD-10-CM | POA: Diagnosis not present

## 2021-11-10 DIAGNOSIS — K219 Gastro-esophageal reflux disease without esophagitis: Secondary | ICD-10-CM | POA: Diagnosis not present

## 2021-11-10 MED ORDER — OLMESARTAN MEDOXOMIL 5 MG PO TABS: 10.0000 mg | ORAL_TABLET | Freq: Every day | ORAL | 3 refills | Status: DC

## 2021-11-10 MED ORDER — OLMESARTAN MEDOXOMIL 20 MG PO TABS: 10.0000 mg | ORAL_TABLET | Freq: Every day | ORAL | 3 refills | Status: DC

## 2021-11-10 NOTE — Telephone Encounter (Signed)
I dont see 10 tabs to order she will have to cut 20 in 1/2 or take 2-5 mg tablets  ?Also have her call pharmacy and discuss with them sent either way ?

## 2021-11-10 NOTE — Telephone Encounter (Signed)
Left detailed message to inform pt that per Dr.Tracy she does not see a choice for her to order 10 mg tabs of the olmesartan, but she has sent in olmesartan 20 mg in which she can cut 1/2 for her to take. She also sent in 5 mg of olmesartan for her to 2 5mg  tabs which either way will equal 10 mg in total whichever she decides on. Both have been sent to pharmacy.  ?

## 2021-11-14 DIAGNOSIS — R002 Palpitations: Secondary | ICD-10-CM | POA: Diagnosis not present

## 2021-11-28 ENCOUNTER — Telehealth: Payer: Self-pay

## 2021-11-28 ENCOUNTER — Telehealth: Payer: Self-pay | Admitting: Internal Medicine

## 2021-11-28 NOTE — Telephone Encounter (Signed)
Copied from CRM [id number]. Topic: Medicare AWV ?>> Nov 28, 2021 10:09 AM [place], [place] Gully wrote: ?Reason for CRM: Left message for patient to schedule Annual Wellness Visit.  Please schedule with Nurse Health Advisor Denisa O'Brien-Blaney, LPN at [facility].  Please call [phone] ask for Olegario Messier ?

## 2021-11-28 NOTE — Telephone Encounter (Signed)
Error

## 2021-11-28 NOTE — Telephone Encounter (Signed)
Left a message on the patient's voice mail to contact our office regarding the Zio monitor. The patient should've received the monitor on 10/31/2021. The patient has a follow up on May 18th to discuss the Zio.  ? ?

## 2021-12-03 ENCOUNTER — Telehealth: Payer: Self-pay | Admitting: Cardiology

## 2021-12-03 NOTE — Telephone Encounter (Signed)
Pt is wanting to know if the appt she has tomorrow can be a virtual appt. Please advise ?

## 2021-12-03 NOTE — Telephone Encounter (Signed)
Patient has a follow up on Dec 04 2021 to discuss Zio moniitor.  ? ?

## 2021-12-03 NOTE — Telephone Encounter (Signed)
Called patient and left a detailed VM per DPR on file informing her that Dr. Azucena Cecil did not do virtual appointments. Advised her to please call Cheryl Coleman back and let Cheryl Coleman know if she will need to reschedule her appointment. ?

## 2021-12-04 ENCOUNTER — Encounter: Payer: Self-pay | Admitting: Cardiology

## 2021-12-04 ENCOUNTER — Ambulatory Visit: Payer: Medicare HMO | Admitting: Cardiology

## 2021-12-04 VITALS — BP 132/78 | HR 86 | Ht 64.0 in | Wt 228.0 lb

## 2021-12-04 DIAGNOSIS — I10 Essential (primary) hypertension: Secondary | ICD-10-CM

## 2021-12-04 DIAGNOSIS — I471 Supraventricular tachycardia, unspecified: Secondary | ICD-10-CM

## 2021-12-04 DIAGNOSIS — E7800 Pure hypercholesterolemia, unspecified: Secondary | ICD-10-CM | POA: Diagnosis not present

## 2021-12-04 MED ORDER — DILTIAZEM HCL ER COATED BEADS 120 MG PO CP24: ORAL_CAPSULE | ORAL | 11 refills | Status: DC

## 2021-12-04 NOTE — Telephone Encounter (Signed)
Patient seen in office today at 8:00 am.

## 2021-12-04 NOTE — Progress Notes (Signed)
Cardiology Office Note:    Date:  12/04/2021   ID:  Cheryl Coleman, DOB Jan 07, 1956, MRN [id number]  PCP:  [name], [name], MD  [facility] Cardiologist:  Debbe Odea, MD  [facility] Electrophysiologist:  None   Referring MD: [name], French Ana *   Chief Complaint  Patient presents with   OTher    6 week follow up -- Meds reviewed verbally with patient.      History of Present Illness:    Cheryl Coleman is a 66 y.o. female with a hx of hypertension, hyperlipidemia, paroxysmal SVT, anxiety who presents for follow-up.    Last seen with palpitations, cardiac monitor was placed to evaluate any significant arrhythmias.  Currently takes Cardizem 240 in the a.m., 120 mg in the p.m. due to the previous paroxysmal SVT.  Could not tolerate Cardizem 360 mg when taken at once.  She was going through a lot of stresses and some financial costs at home.  She states her symptoms are much improved with getting a new job, and the situation around her home much better.  States feeling a lot better currently.   Prior notes Echocardiogram 08/2020 EF 60 to 65%, diastolic function normal. Cardiac monitor on 09/02/2020 paroxysmal SVT,   Past Medical History:  Diagnosis Date   Arthritis    Asthma    COVID    COVID-19    07/24/20, 05/19/21   Diverticulitis    GERD (gastroesophageal reflux disease)    Hiatal hernia    History of blood transfusion    History of chicken pox    History of GI bleed    2013/14   Hyperlipidemia    Hypertension    Leukocytosis     Past Surgical History:  Procedure Laterality Date   BREAST CYST ASPIRATION Right    CESAREAN SECTION     04/27/84   COLONOSCOPY     ESOPHAGOGASTRODUODENOSCOPY (EGD) WITH PROPOFOL N/A 08/29/2020   Procedure: ESOPHAGOGASTRODUODENOSCOPY (EGD) WITH PROPOFOL;  Surgeon: [name], MD;  Location: [facility] ENDOSCOPY;  Service: Gastroenterology;  Laterality: N/A;   ORIF ACETABULAR FRACTURE      Current  Medications: Current Meds  Medication Sig   Acetaminophen (TYLENOL PO) Take by mouth.   albuterol (VENTOLIN HFA) 108 (90 Base) MCG/ACT inhaler INHALE 1-2 PUFFS BY MOUTH EVERY 6 (SIX) HOURS AS NEEDED   ALPRAZolam (XANAX) 0.25 MG tablet Take 0.5-1 tablets (0.125-0.25 mg total) by mouth daily as needed for anxiety.   Ascorbic Acid (VITAMIN C PO) Take by mouth daily.   atorvastatin (LIPITOR) 10 MG tablet TAKE 1 TABLET (10 MG TOTAL) BY MOUTH DAILY AT 6 PM.   CALCIUM PO Take by mouth daily.   citalopram (CELEXA) 10 MG tablet Take 1 tablet (10 mg total) by mouth daily.   fluticasone (FLONASE) 50 MCG/ACT nasal spray SPRAY 2 SPRAYS INTO EACH NOSTRIL EVERY DAY   Loratadine (CLARITIN PO) Take by mouth as needed.   montelukast (SINGULAIR) 10 MG tablet TAKE 1 TABLET BY MOUTH EVERYDAY AT BEDTIME   Multiple Vitamins-Minerals (ZINC PO) Take by mouth daily.   olmesartan (BENICAR) 5 MG tablet Take 2 tablets (10 mg total) by mouth daily.   Olopatadine HCl (PATADAY) 0.7 % SOLN Apply 1 drop to eye daily as needed.   omeprazole (PRILOSEC) 40 MG capsule Take 1 capsule (40 mg total) by mouth 2 (two) times daily.   Specialty Vitamins Products (WOMENS VITA PAK PO) Take by mouth daily.   VITAMIN D PO Take by mouth daily.   [  DISCONTINUED] diltiazem (CARDIZEM CD) 360 MG 24 hr capsule TAKE 1 CAPSULE (360 MG TOTAL) BY MOUTH DAILY.     Allergies:   Oxycodone   Social History   Socioeconomic History   Marital status: Married    Spouse name: Not on file   Number of children: Not on file   Years of education: Not on file   Highest education level: Not on file  Occupational History   Not on file  Tobacco Use   Smoking status: Never   Smokeless tobacco: Never  Vaping Use   Vaping Use: Never used  Substance and Sexual Activity   Alcohol use: Not Currently   Drug use: Not Currently   Sexual activity: Yes    Comment: husband   Other Topics Concern   Not on file  Social History Narrative   [place]ived in [place]    Moved from [place]    Married 1st husband died in late 90s/2000s   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 There is no history of Breast cancer.  ROS:   Please see the history of present illness.     All other systems reviewed and are negative.  EKGs/Labs/Other Studies Reviewed:    The following studies were reviewed today:   EKG:  EKG not ordered today.  Recent Labs: 12/13/2020: TSH 2.29 10/10/2021: ALT 13; Hemoglobin 12.8; Platelets 252.0 10/27/2021: BUN 11; Creatinine, Ser 0.90; Potassium 3.9; Sodium 138  Recent Lipid Panel    Component Value Date/Time   CHOL 154 10/10/2021 0821   TRIG 77.0 10/10/2021 0821   HDL 54.70 10/10/2021 0821   CHOLHDL 3 10/10/2021 0821   VLDL 15.4 10/10/2021 0821   LDLCALC 84 10/10/2021 0821     Risk Assessment/Calculations:      Physical Exam:    VS:  BP 132/78 (BP Location: Left Arm, Patient Position: Sitting, Cuff Size: Large)   Pulse 86   Ht 5\' 4"  (1.626 m)   Wt 228 lb (103.4 kg)   SpO2 99%   BMI 39.14 kg/m     Wt Readings from Last 3 Encounters:  12/04/21 228 lb (103.4 kg)  10/23/21 224 lb (101.6 kg)  10/14/21 226 lb (102.5 kg)     GEN:  Well nourished, well developed in no acute distress HEENT: Normal NECK: No JVD; No carotid bruits LYMPHATICS: No lymphadenopathy CARDIAC: RRR, no murmurs, rubs, gallops RESPIRATORY:  Clear to auscultation without rales, wheezing or rhonchi  ABDOMEN: Soft, non-tender, non-distended MUSCULOSKELETAL:  No edema; No deformity  SKIN: Warm and dry NEUROLOGIC:  Alert and oriented x 3 PSYCHIATRIC:  Normal affect   ASSESSMENT:     1. Paroxysmal SVT (supraventricular tachycardia) (HCC)   2. Pure hypercholesterolemia   3. Primary hypertension   4. Essential hypertension    PLAN:    In order of problems listed above:  Palpitations, cardiac monitor showed 1 run of SVT lasting 10 beats, overall no other significant arrhythmias.  Continue Cardizem CD 240 mg in a.m., 120 mg in p.m..  Lot of symptoms of palpitations brought about by anxiety.  Which  have improved with patient working around stressors. Hyperlipidemia, cholesterol controlled, continue Lipitor. Hypertension, BP controlled.  Cardizem, Benicar.  Follow-up yearly.   Medication Adjustments/Labs and Tests Ordered: Current medicines are reviewed at length with the patient today.  Concerns regarding medicines are outlined above.  No orders of the defined types were placed in this encounter.  Meds ordered this encounter  Medications   diltiazem (CARDIZEM CD) 120 MG 24 hr capsule    Sig: Take 2 capsules (240 MG) every AM, and take 1 capsule (120 MG) every PM.    Dispense:  90 capsule    Refill:  11    D/c 240 dose    Patient Instructions  Medication Instructions:   Your physician has recommended you make the following change in your medication:   Take your Diltiazem 120 MG capsules as:  Take 2 capsules (240 MG) every AM, and take 1 capsule (120 MG) every PM.  *If you need a refill on your cardiac medications before your next appointment, please call your pharmacy*   Lab Work: None ordered If you have labs (blood work) drawn today and your tests are completely normal, you will receive your results only by: MyChart Message (if you have MyChart) OR A paper copy in the mail If you have any lab test that is abnormal or we need to change your treatment, we will call you to review the results.   Testing/Procedures: None ordered   Follow-Up: At [facility], you and your health needs are our priority.  As part of our continuing mission to  provide you with exceptional heart care, we have created designated Provider Care Teams.  These Care Teams include your primary Cardiologist (physician) and Advanced Practice Providers (APPs -  Physician Assistants and Nurse Practitioners) who all work together to provide you with the care you need, when you need it.  We recommend signing up for the patient portal called "MyChart".  Sign up information is provided on this After Visit Summary.  MyChart is used to connect with patients for Virtual Visits (Telemedicine).  Patients are able to view lab/test results, encounter notes, upcoming appointments, etc.  Non-urgent messages can be sent to your provider as well.   To learn more about what you can do with MyChart, go to [email or web address].    Your next appointment:   1 year(s)  The format for your next appointment:   In Person  Provider:   You may see Debbe Odea, MD or one of the following Advanced Practice Providers on your designated Care Team:   Nicolasa Ducking, NP Eula Listen, PA-C Cadence Fransico Michael, [place]    Other Instructions   Important Information About Sugar          Signed, Debbe Odea, MD  12/04/2021 8:35 AM     [facility]

## 2021-12-04 NOTE — Patient Instructions (Signed)
Medication Instructions:   Your physician has recommended you make the following change in your medication:   Take your Diltiazem 120 MG capsules as:  Take 2 capsules (240 MG) every AM, and take 1 capsule (120 MG) every PM.  *If you need a refill on your cardiac medications before your next appointment, please call your pharmacy*   Lab Work: None ordered If you have labs (blood work) drawn today and your tests are completely normal, you will receive your results only by: MyChart Message (if you have MyChart) OR A paper copy in the mail If you have any lab test that is abnormal or we need to change your treatment, we will call you to review the results.   Testing/Procedures: None ordered   Follow-Up: At [facility], you and your health needs are our priority.  As part of our continuing mission to provide you with exceptional heart care, we have created designated Provider Care Teams.  These Care Teams include your primary Cardiologist (physician) and Advanced Practice Providers (APPs -  Physician Assistants and Nurse Practitioners) who all work together to provide you with the care you need, when you need it.  We recommend signing up for the patient portal called "MyChart".  Sign up information is provided on this After Visit Summary.  MyChart is used to connect with patients for Virtual Visits (Telemedicine).  Patients are able to view lab/test results, encounter notes, upcoming appointments, etc.  Non-urgent messages can be sent to your provider as well.   To learn more about what you can do with MyChart, go to [email or web address].    Your next appointment:   1 year(s)  The format for your next appointment:   In Person  Provider:   You may see Debbe Odea, MD or one of the following Advanced Practice Providers on your designated Care Team:   Nicolasa Ducking, NP Eula Listen, PA-C Cadence Fransico Michael, [place]    Other Instructions   Important Information About  Sugar

## 2021-12-05 ENCOUNTER — Other Ambulatory Visit: Payer: Self-pay | Admitting: Internal Medicine

## 2021-12-06 ENCOUNTER — Other Ambulatory Visit: Payer: Self-pay | Admitting: Internal Medicine

## 2021-12-06 DIAGNOSIS — I10 Essential (primary) hypertension: Secondary | ICD-10-CM

## 2022-01-14 ENCOUNTER — Ambulatory Visit (INDEPENDENT_AMBULATORY_CARE_PROVIDER_SITE_OTHER): Payer: Medicare HMO | Admitting: Internal Medicine

## 2022-01-14 ENCOUNTER — Encounter: Payer: Self-pay | Admitting: Internal Medicine

## 2022-01-14 VITALS — BP 130/80 | HR 90 | Temp 98.1°F | Resp 14 | Ht 64.0 in | Wt 224.8 lb

## 2022-01-14 DIAGNOSIS — Z6838 Body mass index (BMI) 38.0-38.9, adult: Secondary | ICD-10-CM | POA: Diagnosis not present

## 2022-01-14 DIAGNOSIS — I10 Essential (primary) hypertension: Secondary | ICD-10-CM

## 2022-01-14 DIAGNOSIS — K219 Gastro-esophageal reflux disease without esophagitis: Secondary | ICD-10-CM

## 2022-01-14 DIAGNOSIS — B370 Candidal stomatitis: Secondary | ICD-10-CM

## 2022-01-14 DIAGNOSIS — J029 Acute pharyngitis, unspecified: Secondary | ICD-10-CM

## 2022-01-14 DIAGNOSIS — J4520 Mild intermittent asthma, uncomplicated: Secondary | ICD-10-CM | POA: Diagnosis not present

## 2022-01-14 MED ORDER — OMEPRAZOLE 40 MG PO CPDR: 40.0000 mg | DELAYED_RELEASE_CAPSULE | Freq: Every day | ORAL | 3 refills | Status: DC

## 2022-01-14 MED ORDER — ALBUTEROL SULFATE HFA 108 (90 BASE) MCG/ACT IN AERS: INHALATION_SPRAY | RESPIRATORY_TRACT | 11 refills | Status: DC

## 2022-01-14 MED ORDER — SALINE SPRAY 0.65 % NA SOLN: 2.0000 | NASAL | 3 refills | Status: DC | PRN

## 2022-01-14 MED ORDER — DILTIAZEM HCL ER COATED BEADS 240 MG PO CP24: 240.0000 mg | ORAL_CAPSULE | Freq: Every day | ORAL | 3 refills | Status: DC

## 2022-01-14 MED ORDER — NYSTATIN 100000 UNIT/ML MT SUSP: 5.0000 mL | Freq: Four times a day (QID) | OROMUCOSAL | 0 refills | Status: DC

## 2022-01-14 MED ORDER — DILTIAZEM HCL ER COATED BEADS 120 MG PO CP24: 120.0000 mg | ORAL_CAPSULE | Freq: Every day | ORAL | 3 refills | Status: DC

## 2022-01-14 NOTE — Patient Instructions (Addendum)
Try allergy claritin, allegra, zyrtec or xyzal   If needed pepcid 1-2 x per day for heartburn/sore throat  Dr. Allegra Lai call for sore throat   Phone Fax E-mail Address  [phone] Not available [address]   [place] [address]     Specialties     Gastroenterology             [facility] [place] [facility] [place]   [facility]   Sore Throat A sore throat is pain, burning, irritation, or scratchiness in the throat. When you have a sore throat, you may feel pain or tenderness in your throat when you swallow or talk. Many things can cause a sore throat, including: An infection. Seasonal allergies. Dryness in the air. Irritants, such as smoke or pollution. Radiation treatment for cancer. Gastroesophageal reflux disease (GERD). A tumor. A sore throat is often the first sign of another sickness. It may happen with other symptoms, such as coughing, sneezing, fever, and swollen neck glands. Most sore throats go away without medical treatment. Follow these instructions at home:     Medicines Take over-the-counter and prescription medicines only as told by your health care provider. Children often get sore throats. Do not give your child aspirin because of the association with Reye's syndrome. Use throat sprays to soothe your throat as told by your health care provider. Managing pain To help with pain, try: Sipping warm liquids, such as broth, herbal tea, or warm water. Eating or drinking cold or frozen liquids, such as frozen ice pops. Gargling with a mixture of salt and water 3-4 times a day or as needed. To make salt water, completely dissolve -1 tsp (3-6 g) of salt in 1 cup (237 mL) of warm water. Sucking on hard candy or throat lozenges. Putting a cool-mist humidifier in your bedroom at night to moisten the air. Sitting in the bathroom with the door closed for 5-10 minutes while you run hot water in the  shower. General instructions Do not use any products that contain nicotine or tobacco. These products include cigarettes, chewing tobacco, and vaping devices, such as e-cigarettes. If you need help quitting, ask your health care provider. Rest as needed. Drink enough fluid to keep your urine pale yellow. Wash your hands often with soap and water for at least 20 seconds. If soap and water are not available, use hand sanitizer. Contact a health care provider if: You have a fever for more than 2-3 days. You have symptoms that last for more than 2-3 days. Your throat does not get better within 7 days. You have a fever and your symptoms suddenly get worse. Get help right away if: You have difficulty breathing. You cannot swallow fluids, soft foods, or your saliva. You have increased swelling in your throat or neck. You have persistent nausea and vomiting. These symptoms may represent a serious problem that is an emergency. Do not wait to see if the symptoms will go away. Get medical help right away. Call your local emergency services (911 in the [place] Do not drive yourself to the hospital. Summary A sore throat is pain, burning, irritation, or scratchiness in the throat. Many things can cause a sore throat. Take over-the-counter medicines only as told by your health care provider. Rest as needed. Drink enough fluid to keep your urine pale yellow. Contact a health care provider if your throat does not get better within 7 days. This information is not intended  to replace advice given to you by your health care provider. Make sure you discuss any questions you have with your health care provider. Document Revised: 10/02/2020 Document Reviewed: 10/02/2020 Elsevier Patient Education  2023 [employer].

## 2022-01-14 NOTE — Progress Notes (Signed)
Chief Complaint  Patient presents with   Follow-up    6 mon, disc about sore throat ongoing issue since traveling, she c/o feeling congested,phlegm,wheezing at night, saw ENT earlier in the yr and nothing was found   Medication Problem    Disc diltiazem, pt states she is unable to receive 340 mg of diltiazem w/o additional rx   F/u  1. Htn controlled on dilt 240 in am and 120 qhs benicar 10 mg qd  2. C/o sore throat saw ent nothing wrong feeling congested at times with +phelgm, wheezing on singulair, prn albuterol not taking antihistamine on prilosec 40 mg qd has GERD and small hiatal hernia and h/o candida esophagitis f/u Dr. Allegra Lai if continues     Review of Systems  Constitutional:  Negative for weight loss.  HENT:  Positive for sore throat. Negative for hearing loss.   Eyes:  Negative for blurred vision.  Respiratory:  Negative for shortness of breath.   Cardiovascular:  Negative for chest pain.  Gastrointestinal:  Negative for abdominal pain and blood in stool.  Genitourinary:  Negative for dysuria.  Musculoskeletal:  Negative for falls and joint pain.  Skin:  Negative for rash.  Neurological:  Negative for headaches.  Psychiatric/Behavioral:  Negative for depression.    Past Medical History:  Diagnosis Date   Arthritis    Asthma    COVID    COVID-19    07/24/20, 05/19/21   Diverticulitis    GERD (gastroesophageal reflux disease)    Hiatal hernia    History of blood transfusion    History of chicken pox    History of GI bleed    2013/14   Hyperlipidemia    Hypertension    Leukocytosis    Past Surgical History:  Procedure Laterality Date   BREAST CYST ASPIRATION Right    CESAREAN SECTION     04/27/84   COLONOSCOPY     ESOPHAGOGASTRODUODENOSCOPY (EGD) WITH PROPOFOL N/A 08/29/2020   Procedure: ESOPHAGOGASTRODUODENOSCOPY (EGD) WITH PROPOFOL;  Surgeon: [name], MD;  Location: [facility] ENDOSCOPY;  Service: Gastroenterology;  Laterality: N/A;   ORIF ACETABULAR  FRACTURE     Family History  Problem Relation Age of Onset   Hypertension Maternal Aunt    Diabetes Maternal Aunt    Breast cancer Neg Hx    Social History   Socioeconomic History   Marital status: Married    Spouse name: Not on file   Number of children: Not on file   Years of education: Not on file   Highest education level: Not on file  Occupational History   Not on file  Tobacco Use   Smoking status: Never   Smokeless tobacco: Never  Vaping Use   Vaping Use: Never used  Substance and Sexual Activity   Alcohol use: Not Currently   Drug use: Not Currently   Sexual activity: Yes    Comment: husband   Other Topics Concern   Not on file  Social History Narrative   Lived in [place] from [place]    Married 1st husband died in late 90s/2000s   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   Ascorbic Acid (VITAMIN C PO) Take by mouth daily.   atorvastatin (LIPITOR) 10 MG tablet TAKE 1 TABLET (10 MG TOTAL) BY MOUTH DAILY AT 6 PM.   CALCIUM PO Take by mouth daily.   citalopram (CELEXA) 10 MG tablet Take 1 tablet (10 mg total) by mouth daily.   diltiazem (CARDIZEM CD) 120 MG 24 hr capsule Take 1 capsule (120 mg total) by mouth daily. qhs   diltiazem (CARDIZEM CD) 240 MG 24 hr capsule Take 1 capsule (240 mg total) by mouth daily. In am   fluticasone (FLONASE) 50 MCG/ACT nasal spray SPRAY 2 SPRAYS INTO EACH NOSTRIL EVERY DAY   montelukast (SINGULAIR) 10 MG tablet TAKE 1 TABLET BY MOUTH EVERYDAY AT BEDTIME   Multiple Vitamins-Minerals (ZINC PO) Take by mouth daily.   nystatin (MYCOSTATIN) 100000 UNIT/ML suspension  Take 5 mLs (500,000 Units total) by mouth 4 (four) times daily. Swish, gargle and swallow   olmesartan (BENICAR) 5 MG tablet Take 2 tablets (10 mg total) by mouth daily.   sodium chloride (OCEAN) 0.65 % SOLN nasal spray Place 2 sprays into both nostrils as needed for congestion. Before flonase   Specialty Vitamins Products (WOMENS VITA PAK PO) Take by mouth daily.   VITAMIN D PO Take by mouth daily.   [DISCONTINUED] albuterol (VENTOLIN HFA) 108 (90 Base) MCG/ACT inhaler INHALE 1-2 PUFFS BY MOUTH EVERY 6 (SIX) HOURS AS NEEDED   [DISCONTINUED] diltiazem (CARDIZEM CD) 120 MG 24 hr capsule Take 2 capsules (240 MG) every AM, and take 1 capsule (120 MG) every PM.   [DISCONTINUED] omeprazole (PRILOSEC) 40 MG capsule TAKE 1 CAPSULE BY MOUTH TWICE A DAY   Allergies  Allergen Reactions   Oxycodone Other (See Comments)    Feels bad   Recent Results (from the past 2160 hour(s))  Basic Metabolic Panel (BMET)     Status: None   Collection Time: 10/27/21  7:57 AM  Result Value Ref Range   Sodium 138 135 - 145 mEq/L   Potassium 3.9 3.5 - 5.1 mEq/L   Chloride 103 96 - 112 mEq/L   CO2 26 19 - 32 mEq/L   Glucose, Bld 98 70 - 99 mg/dL   BUN 11 6 - 23 mg/dL   Creatinine, Ser 4.96 0.40 - 1.20 mg/dL   GFR 75.91 >63.84 mL/min    Comment: Calculated using the CKD-EPI Creatinine Equation (2021)   Calcium 9.8 8.4 - 10.5 mg/dL   Objective  Body mass index is 38.59 kg/m. Wt Readings from Last 3 Encounters:  01/14/22 224 lb 12.8 oz (102 kg)  12/04/21 228 lb (103.4 kg)  10/23/21 224 lb (101.6 kg)   Temp Readings from Last 3 Encounters:  01/14/22 98.1 F (36.7 C) (Oral)  10/14/21 98.5 F (36.9 C) (Oral)  09/12/21 98.2 F (36.8 C) (Oral)   BP Readings from Last 3 Encounters:  01/14/22 130/80  12/04/21 132/78  10/23/21 120/66   Pulse Readings from Last 3 Encounters:  01/14/22 90  12/04/21 86  10/23/21 84    Physical Exam Vitals and nursing note reviewed.  Constitutional:      Appearance:  Normal appearance. She is well-developed and well-groomed.  HENT:     Head: Normocephalic and atraumatic.     Mouth/Throat:     Comments: Ulcerations upper palate  Eyes:     Conjunctiva/sclera: Conjunctivae normal.     Pupils: Pupils are equal, round, and reactive to light.  Cardiovascular:     Rate and Rhythm: Normal rate and regular rhythm.     Heart sounds: Normal heart sounds. No murmur heard. Pulmonary:     Effort: Pulmonary effort is normal.     Breath sounds: Normal breath sounds.  Abdominal:     General: Abdomen is flat. Bowel sounds are normal.     Tenderness: There is no abdominal tenderness.  Musculoskeletal:        General: No tenderness.  Skin:    General: Skin is warm and dry.  Neurological:     General: No focal deficit present.     Mental Status: She is alert and oriented to person, place, and time. Mental status is at baseline.     Cranial Nerves: Cranial nerves 2-12 are intact.     Motor: Motor function is intact.     Coordination: Coordination is intact.     Gait: Gait is intact.  Psychiatric:        Attention and Perception: Attention and perception normal.        Mood and Affect: Mood and affect normal.        Speech: Speech normal.        Behavior: Behavior normal. Behavior is cooperative.        Thought Content: Thought content normal.        Cognition and Memory: Cognition and memory normal.        Judgment: Judgment normal.     Assessment  Plan  Hypertension, unspecified type - Plan: diltiazem (CARDIZEM CD) 240 MG 24 hr capsule, diltiazem (CARDIZEM CD) 120 MG 24 hr capsule Benicar 10 mg qd   Mild intermittent asthma, unspecified whether complicated - Plan: albuterol (VENTOLIN HFA) 108 (90 Base) MCG/ACT inhaler Add AH otc  Singulair will take qhs  Gastroesophageal reflux disease without esophagitis - Plan: omeprazole (PRILOSEC) 40 MG capsule qd add in pepcid 20 mg 1-2 x per day   Thrush - Plan: nystatin (MYCOSTATIN) 100000 UNIT/ML suspension    Sore throat if continues f/u Dr. Allegra Lai seen ENT nothing wrong   HM Flu shot utd T[place]ap [place]  Consider prevnar 20, shingrix further covid shots pna had 2019 [place]  -consider prevnar and pna 23 get records D.r Tawny [place] Consider shingrix in future  Pfizer covid 3/3 rec 4th   04/02/20 mammo normal ordered sch 08/20/21 negative    Colonoscopy get records [place] obtained:  colonoscopy 10/15/06 mod to severe diverticulosis IH f/u in 5 years  EGD 11/29/07 candida, mild gastritis  EGD 01/03/13/colonoscopy diverticulosis ext and int hemorrhoids TI with ulceration neg H pylori UGD 02/01/18 gastritis ? If had colonoscopy  GI established [place] f/u 08/20/20   Pap 10/2019 normal neg neg hpv   DEXA age 2 osteopenia calcium + vit D repeat 3-5 years had 04/02/20    Provider: Dr. French Ana [name]-Internal Medicine

## 2022-01-23 ENCOUNTER — Other Ambulatory Visit: Payer: Self-pay

## 2022-01-23 ENCOUNTER — Encounter
Admission: EM | Disposition: A | Discharge status: Home / Self Care | Payer: Self-pay | Source: Home / Self Care | Attending: Internal Medicine

## 2022-01-23 ENCOUNTER — Emergency Department: Payer: Medicare HMO

## 2022-01-23 ENCOUNTER — Inpatient Hospital Stay
Admission: EM | Admit: 2022-01-23 | Discharge: 2022-01-24 | DRG: 356 | Disposition: A | Discharge status: Home / Self Care | Payer: Medicare HMO | Attending: Osteopathic Medicine | Admitting: Osteopathic Medicine

## 2022-01-23 ENCOUNTER — Telehealth: Payer: Self-pay | Admitting: Gastroenterology

## 2022-01-23 ENCOUNTER — Inpatient Hospital Stay: Payer: Medicare HMO

## 2022-01-23 DIAGNOSIS — K5731 Diverticulosis of large intestine without perforation or abscess with bleeding: Principal | ICD-10-CM | POA: Diagnosis present

## 2022-01-23 DIAGNOSIS — D72829 Elevated white blood cell count, unspecified: Secondary | ICD-10-CM | POA: Diagnosis not present

## 2022-01-23 DIAGNOSIS — M1990 Unspecified osteoarthritis, unspecified site: Secondary | ICD-10-CM | POA: Diagnosis present

## 2022-01-23 DIAGNOSIS — R578 Other shock: Secondary | ICD-10-CM | POA: Diagnosis present

## 2022-01-23 DIAGNOSIS — D62 Acute posthemorrhagic anemia: Secondary | ICD-10-CM | POA: Diagnosis present

## 2022-01-23 DIAGNOSIS — Z885 Allergy status to narcotic agent status: Secondary | ICD-10-CM

## 2022-01-23 DIAGNOSIS — K922 Gastrointestinal hemorrhage, unspecified: Secondary | ICD-10-CM

## 2022-01-23 DIAGNOSIS — I10 Essential (primary) hypertension: Secondary | ICD-10-CM | POA: Diagnosis present

## 2022-01-23 DIAGNOSIS — Z8616 Personal history of COVID-19: Secondary | ICD-10-CM | POA: Diagnosis not present

## 2022-01-23 DIAGNOSIS — R000 Tachycardia, unspecified: Secondary | ICD-10-CM | POA: Diagnosis present

## 2022-01-23 DIAGNOSIS — Z79899 Other long term (current) drug therapy: Secondary | ICD-10-CM

## 2022-01-23 DIAGNOSIS — Z452 Encounter for adjustment and management of vascular access device: Secondary | ICD-10-CM | POA: Diagnosis not present

## 2022-01-23 DIAGNOSIS — E785 Hyperlipidemia, unspecified: Secondary | ICD-10-CM | POA: Diagnosis not present

## 2022-01-23 DIAGNOSIS — Z8249 Family history of ischemic heart disease and other diseases of the circulatory system: Secondary | ICD-10-CM | POA: Diagnosis not present

## 2022-01-23 DIAGNOSIS — I700 Atherosclerosis of aorta: Secondary | ICD-10-CM | POA: Diagnosis not present

## 2022-01-23 DIAGNOSIS — K625 Hemorrhage of anus and rectum: Secondary | ICD-10-CM

## 2022-01-23 DIAGNOSIS — N323 Diverticulum of bladder: Secondary | ICD-10-CM | POA: Diagnosis not present

## 2022-01-23 DIAGNOSIS — J4520 Mild intermittent asthma, uncomplicated: Secondary | ICD-10-CM | POA: Diagnosis not present

## 2022-01-23 DIAGNOSIS — K219 Gastro-esophageal reflux disease without esophagitis: Secondary | ICD-10-CM | POA: Diagnosis present

## 2022-01-23 DIAGNOSIS — R0989 Other specified symptoms and signs involving the circulatory and respiratory systems: Secondary | ICD-10-CM | POA: Diagnosis not present

## 2022-01-23 DIAGNOSIS — Z8619 Personal history of other infectious and parasitic diseases: Secondary | ICD-10-CM

## 2022-01-23 DIAGNOSIS — K8020 Calculus of gallbladder without cholecystitis without obstruction: Secondary | ICD-10-CM | POA: Diagnosis not present

## 2022-01-23 DIAGNOSIS — Z8719 Personal history of other diseases of the digestive system: Secondary | ICD-10-CM

## 2022-01-23 HISTORY — PX: EMBOLIZATION: CATH118239

## 2022-01-23 LAB — CBC
HCT: 38.2 % (ref 36.0–46.0)
Hemoglobin: 13.3 g/dL (ref 12.0–15.0)
MCH: 26.9 pg (ref 26.0–34.0)
MCHC: 34.8 g/dL (ref 30.0–36.0)
MCV: 77.2 fL — ABNORMAL LOW (ref 80.0–100.0)
Platelets: 270 K/uL (ref 150–400)
RBC: 4.95 MIL/uL (ref 3.87–5.11)
RDW: 15.1 % (ref 11.5–15.5)
WBC: 9.2 K/uL (ref 4.0–10.5)
nRBC: 0 % (ref 0.0–0.2)

## 2022-01-23 LAB — IRON AND TIBC
Iron: 69 ug/dL (ref 28–170)
Saturation Ratios: 22 % (ref 10.4–31.8)
TIBC: 311 ug/dL (ref 250–450)
UIBC: 242 ug/dL

## 2022-01-23 LAB — COMPREHENSIVE METABOLIC PANEL WITH GFR
ALT: 17 U/L (ref 0–44)
AST: 22 U/L (ref 15–41)
Albumin: 4.4 g/dL (ref 3.5–5.0)
Alkaline Phosphatase: 63 U/L (ref 38–126)
Anion gap: 8 (ref 5–15)
BUN: 9 mg/dL (ref 8–23)
CO2: 26 mmol/L (ref 22–32)
Calcium: 9.5 mg/dL (ref 8.9–10.3)
Chloride: 106 mmol/L (ref 98–111)
Creatinine, Ser: 0.85 mg/dL (ref 0.44–1.00)
GFR, Estimated: >60 mL/min
Glucose, Bld: 104 mg/dL — ABNORMAL HIGH (ref 70–99)
Potassium: 3.6 mmol/L (ref 3.5–5.1)
Sodium: 140 mmol/L (ref 135–145)
Total Bilirubin: 0.7 mg/dL (ref 0.3–1.2)
Total Protein: 8.6 g/dL — ABNORMAL HIGH (ref 6.5–8.1)

## 2022-01-23 LAB — GLUCOSE, CAPILLARY: Glucose-Capillary: 114 mg/dL — ABNORMAL HIGH (ref 70–99)

## 2022-01-23 LAB — HEMOGLOBIN AND HEMATOCRIT, BLOOD
HCT: 35.7 % — ABNORMAL LOW (ref 36.0–46.0)
HCT: 35.5 % — ABNORMAL LOW (ref 36.0–46.0)
Hemoglobin: 12.4 g/dL (ref 12.0–15.0)
Hemoglobin: 12.2 g/dL (ref 12.0–15.0)

## 2022-01-23 LAB — ABO/RH: ABO/RH(D): O POS

## 2022-01-23 LAB — HIV ANTIBODY (ROUTINE TESTING W REFLEX): HIV Screen 4th Generation wRfx: NONREACTIVE

## 2022-01-23 SURGERY — EMBOLIZATION
Anesthesia: Moderate Sedation

## 2022-01-23 MED ORDER — CHLORHEXIDINE GLUCONATE CLOTH 2 % EX PADS: 6.0000 | MEDICATED_PAD | Freq: Every day | CUTANEOUS | Status: DC

## 2022-01-23 MED ORDER — ALBUTEROL SULFATE (2.5 MG/3ML) 0.083% IN NEBU: 2.5000 mg | INHALATION_SOLUTION | RESPIRATORY_TRACT | Status: DC | PRN

## 2022-01-23 MED ORDER — SODIUM CHLORIDE 0.9 % IV SOLN: 250.0000 mL | INTRAVENOUS | Status: DC | PRN

## 2022-01-23 MED ORDER — HEPARIN SODIUM (PORCINE) 1000 UNIT/ML IJ SOLN
INTRAMUSCULAR | Status: AC
  Filled 2022-01-23: qty 10

## 2022-01-23 MED ORDER — IOHEXOL 350 MG/ML SOLN
100.0000 mL | Freq: Once | INTRAVENOUS | Status: AC | PRN
  Administered 2022-01-23: 100 mL via INTRAVENOUS

## 2022-01-23 MED ORDER — HYDROMORPHONE HCL 1 MG/ML IJ SOLN
1.0000 mg | Freq: Once | INTRAMUSCULAR | Status: AC | PRN
  Administered 2022-01-23: 1 mg via INTRAVENOUS
  Filled 2022-01-23: qty 1

## 2022-01-23 MED ORDER — MIDAZOLAM HCL 2 MG/ML PO SYRP
8.0000 mg | ORAL_SOLUTION | Freq: Once | ORAL | Status: DC | PRN
  Filled 2022-01-23: qty 4

## 2022-01-23 MED ORDER — ATORVASTATIN CALCIUM 10 MG PO TABS
10.0000 mg | ORAL_TABLET | Freq: Every day | ORAL | Status: DC
  Administered 2022-01-23: 10 mg via ORAL
  Filled 2022-01-23: qty 1

## 2022-01-23 MED ORDER — MIDAZOLAM HCL 2 MG/2ML IJ SOLN
INTRAMUSCULAR | Status: AC
  Filled 2022-01-23: qty 4

## 2022-01-23 MED ORDER — MIDAZOLAM HCL 2 MG/2ML IJ SOLN
INTRAMUSCULAR | Status: DC | PRN
  Administered 2022-01-23: 1 mg via INTRAVENOUS

## 2022-01-23 MED ORDER — SODIUM CHLORIDE 0.9% FLUSH: 3.0000 mL | INTRAVENOUS | Status: DC | PRN

## 2022-01-23 MED ORDER — FENTANYL CITRATE PF 50 MCG/ML IJ SOSY
PREFILLED_SYRINGE | INTRAMUSCULAR | Status: AC
  Filled 2022-01-23: qty 2

## 2022-01-23 MED ORDER — NOREPINEPHRINE 4 MG/250ML-% IV SOLN: 0.0000 ug/min | INTRAVENOUS | Status: DC

## 2022-01-23 MED ORDER — CEFAZOLIN SODIUM-DEXTROSE 2-4 GM/100ML-% IV SOLN: 2.0000 g | INTRAVENOUS | Status: AC

## 2022-01-23 MED ORDER — DIPHENHYDRAMINE HCL 50 MG/ML IJ SOLN: 50.0000 mg | Freq: Once | INTRAMUSCULAR | Status: DC | PRN

## 2022-01-23 MED ORDER — SODIUM CHLORIDE 0.9% FLUSH
3.0000 mL | Freq: Two times a day (BID) | INTRAVENOUS | Status: DC
  Administered 2022-01-23 – 2022-01-24 (×2): 3 mL via INTRAVENOUS

## 2022-01-23 MED ORDER — SIMETHICONE 40 MG/0.6ML PO SUSP
80.0000 mg | Freq: Four times a day (QID) | ORAL | Status: DC | PRN
  Administered 2022-01-24: 80 mg via ORAL
  Filled 2022-01-23 (×2): qty 1.2

## 2022-01-23 MED ORDER — SODIUM CHLORIDE 0.9 % IV SOLN: INTRAVENOUS | Status: DC

## 2022-01-23 MED ORDER — SODIUM CHLORIDE 0.9 % IV SOLN: Freq: Once | INTRAVENOUS | Status: DC

## 2022-01-23 MED ORDER — SODIUM CHLORIDE 0.9 % IV BOLUS: Freq: Once | INTRAVENOUS | Status: DC

## 2022-01-23 MED ORDER — ONDANSETRON HCL 4 MG/2ML IJ SOLN
4.0000 mg | Freq: Four times a day (QID) | INTRAMUSCULAR | Status: DC | PRN
  Administered 2022-01-23 – 2022-01-24 (×2): 4 mg via INTRAVENOUS
  Filled 2022-01-23 (×2): qty 2

## 2022-01-23 MED ORDER — FLUTICASONE PROPIONATE 50 MCG/ACT NA SUSP
1.0000 | Freq: Every day | NASAL | Status: DC
  Filled 2022-01-23: qty 16

## 2022-01-23 MED ORDER — METHYLPREDNISOLONE SODIUM SUCC 125 MG IJ SOLR: 125.0000 mg | Freq: Once | INTRAMUSCULAR | Status: DC | PRN

## 2022-01-23 MED ORDER — FAMOTIDINE 20 MG PO TABS: 40.0000 mg | ORAL_TABLET | Freq: Once | ORAL | Status: DC | PRN

## 2022-01-23 MED ORDER — SODIUM CHLORIDE 0.9 % IV SOLN: INTRAVENOUS | Status: AC

## 2022-01-23 MED ORDER — FENTANYL CITRATE (PF) 100 MCG/2ML IJ SOLN
INTRAMUSCULAR | Status: DC | PRN
  Administered 2022-01-23: 25 ug via INTRAVENOUS

## 2022-01-23 MED ORDER — CEFAZOLIN SODIUM-DEXTROSE 2-4 GM/100ML-% IV SOLN
INTRAVENOUS | Status: AC
  Administered 2022-01-23: 2 g via INTRAVENOUS
  Filled 2022-01-23: qty 100

## 2022-01-23 SURGICAL SUPPLY — 15 items
CANNULA 5F STIFF (CANNULA) ×1 IMPLANT
CATH ANGIO 5F PIGTAIL 65CM (CATHETERS) ×1 IMPLANT
CATH MICROCATH PRGRT 2.8F 110 (CATHETERS) IMPLANT
CATH VS15FR (CATHETERS) ×1 IMPLANT
COVER PROBE U/S 5X48 (MISCELLANEOUS) ×1 IMPLANT
DEVICE STARCLOSE SE CLOSURE (Vascular Products) ×1 IMPLANT
GUIDEWIRE ANGLED .035 180CM (WIRE) ×1 IMPLANT
MICROCATH PROGREAT 2.8F 110 CM (CATHETERS) ×2
PACK ANGIOGRAPHY (CUSTOM PROCEDURE TRAY) ×2 IMPLANT
SHEATH BRITE TIP 5FRX11 (SHEATH) ×1 IMPLANT
SYR EMBOSPHERE 500-700 2ML (Embolic) ×2 IMPLANT
SYR MEDRAD MARK 7 150ML (SYRINGE) ×1 IMPLANT
SYRINGE EMBOSPHERE 500-700 2ML (Embolic) IMPLANT
TUBING CONTRAST HIGH PRESS 72 (TUBING) ×1 IMPLANT
WIRE GUIDERIGHT .035X150 (WIRE) ×1 IMPLANT

## 2022-01-23 NOTE — ED Notes (Signed)
Dr Lucky Rathke made aware of patients bloody bowel movements and hemoglobin levels , no further orders

## 2022-01-23 NOTE — Consult Note (Signed)
[facility] VEIN SPECIALISTS Vascular Consult Note  MRN : [id number]  Cheryl Coleman is a 66 y.o. (May 28, 1956) female who presents with chief complaint of  Chief Complaint  Patient presents with   GI Bleeding  .   Consulting Physician: Lucile Shutters, MD Reason for consult: GI bleed History of Present Illness: Cheryl Coleman is a 67 year old female with past medical history significant for GERD, diverticular disease, hypertension, hiatal hernia who presented to [facility] on 01/23/2022 in regards to a GI bleed.  She notes that she began bleeding bright red blood from her rectum with bowel movements on 01/22/2022.  She denies any abdominal pain currently.  The patient notes that she has previously had a GI bleed and the symptoms are consistent with her previous GI bleed.  Currently she is hemodynamically stable.  She denies any dizziness or lightheadedness.  She has no chest pain, shortness of breath or abdominal pain.  Current Facility-Administered Medications  Medication Dose Route Frequency Provider Last Rate Last Admin   ceFAZolin (ANCEF) 2-4 GM/100ML-% IVPB            0.9 %  sodium chloride infusion   Intravenous Continuous Georgiana Spinner, NP       0.9 %  sodium chloride infusion   Intravenous Continuous Agbata, Tochukwu, MD 100 mL/hr at 01/23/22 1047 New Bag at 01/23/22 1047   albuterol (PROVENTIL) (2.5 MG/3ML) 0.083% nebulizer solution 2.5 mg  2.5 mg Inhalation Q4H PRN Agbata, Tochukwu, MD       ceFAZolin (ANCEF) IVPB 2g/100 mL premix  2 g Intravenous 30 min Pre-Op Georgiana Spinner, NP       diphenhydrAMINE (BENADRYL) injection 50 mg  50 mg Intravenous Once PRN Georgiana Spinner, NP       famotidine (PEPCID) tablet 40 mg  40 mg Oral Once PRN Georgiana Spinner, NP       fluticasone (FLONASE) 50 MCG/ACT nasal spray 1 spray  1 spray Each Nare Daily Agbata, Tochukwu, MD       HYDROmorphone (DILAUDID) injection 1 mg  1 mg Intravenous Once PRN Georgiana Spinner, NP        methylPREDNISolone sodium succinate (SOLU-MEDROL) 125 mg/2 mL injection 125 mg  125 mg Intravenous Once PRN Georgiana Spinner, NP       midazolam (VERSED) 2 MG/ML syrup 8 mg  8 mg Oral Once PRN Georgiana Spinner, NP       ondansetron [facility]) injection 4 mg  4 mg Intravenous Q6H PRN Georgiana Spinner, NP       Current Outpatient Medications  Medication Sig Dispense Refill   Acetaminophen (TYLENOL PO) Take by mouth.     Ascorbic Acid (VITAMIN C PO) Take by mouth daily.     atorvastatin (LIPITOR) 10 MG tablet TAKE 1 TABLET (10 MG TOTAL) BY MOUTH DAILY AT 6 PM. 90 tablet 3   CALCIUM PO Take by mouth daily.     citalopram (CELEXA) 10 MG tablet Take 1 tablet (10 mg total) by mouth daily. 90 tablet 3   diltiazem (CARDIZEM CD) 120 MG 24 hr capsule Take 1 capsule (120 mg total) by mouth daily. qhs 90 capsule 3   diltiazem (CARDIZEM CD) 240 MG 24 hr capsule Take 1 capsule (240 mg total) by mouth daily. In am 90 capsule 3   fluticasone (FLONASE) 50 MCG/ACT nasal spray SPRAY 2 SPRAYS INTO EACH NOSTRIL EVERY DAY 48 mL 3   montelukast (SINGULAIR) 10 MG tablet TAKE 1 TABLET  BY MOUTH EVERYDAY AT BEDTIME 90 tablet 3   Multiple Vitamins-Minerals (ZINC PO) Take by mouth daily.     olmesartan (BENICAR) 5 MG tablet Take 2 tablets (10 mg total) by mouth daily. 180 tablet 3   omeprazole (PRILOSEC) 40 MG capsule Take 1 capsule (40 mg total) by mouth daily. Qhs 30 min before food 90 capsule 3   sodium chloride (OCEAN) 0.65 % SOLN nasal spray Place 2 sprays into both nostrils as needed for congestion. Before flonase 90 mL 3   Specialty Vitamins Products (WOMENS VITA PAK PO) Take by mouth daily.     VITAMIN D PO Take by mouth daily.     albuterol (VENTOLIN HFA) 108 (90 Base) MCG/ACT inhaler INHALE 1-2 PUFFS BY MOUTH EVERY 6 (SIX) HOURS AS NEEDED 18 each 11   ALPRAZolam (XANAX) 0.25 MG tablet Take 0.5-1 tablets (0.125-0.25 mg total) by mouth daily as needed for anxiety. (Patient not taking: Reported on 01/14/2022) 30 tablet 5    Loratadine (CLARITIN PO) Take by mouth as needed. (Patient not taking: Reported on 01/14/2022)     nystatin (MYCOSTATIN) 100000 UNIT/ML suspension Take 5 mLs (500,000 Units total) by mouth 4 (four) times daily. Swish, gargle and swallow 473 mL 0   Olopatadine HCl (PATADAY) 0.7 % SOLN Apply 1 drop to eye daily as needed. (Patient not taking: Reported on 01/14/2022) 2.5 mL 11    Past Medical History:  Diagnosis Date   Arthritis    Asthma    COVID    COVID-19    07/24/20, 05/19/21   Diverticulitis    GERD (gastroesophageal reflux disease)    Hiatal hernia    History of blood transfusion    History of chicken pox    History of GI bleed    2013/14   Hyperlipidemia    Hypertension    Leukocytosis     Past Surgical History:  Procedure Laterality Date   BREAST CYST ASPIRATION Right    CESAREAN SECTION     04/27/84   COLONOSCOPY     ESOPHAGOGASTRODUODENOSCOPY (EGD) WITH PROPOFOL N/A 08/29/2020   Procedure: ESOPHAGOGASTRODUODENOSCOPY (EGD) WITH PROPOFOL;  Surgeon: [name], MD;  Location: [facility] ENDOSCOPY;  Service: Gastroenterology;  Laterality: N/A;   ORIF ACETABULAR FRACTURE      Social History Social History   Tobacco Use   Smoking status: Never   Smokeless tobacco: Never  Vaping Use   Vaping Use: Never used  Substance Use Topics   Alcohol use: Not Currently   Drug use: Not Currently    Family History Family History  Problem Relation Age of Onset   Hypertension Maternal Aunt    Diabetes Maternal Aunt    Breast cancer Neg Hx     Allergies  Allergen Reactions   Oxycodone Other (See Comments)    Feels bad     REVIEW OF SYSTEMS (Negative unless checked)  Constitutional: [] Weight loss  [] Fever  [] Chills Cardiac: [] Chest pain   [] Chest pressure   [] Palpitations   [] Shortness of breath when laying flat   [] Shortness of breath at rest   [] Shortness of breath with exertion. Vascular:  [] Pain in legs with walking   [] Pain in legs at rest   [] Pain in legs when  laying flat   [] Claudication   [] Pain in feet when walking  [] Pain in feet at rest  [] Pain in feet when laying flat   [] History of DVT   [] Phlebitis   [] Swelling in legs   [] Varicose veins   [] Non-healing ulcers  Pulmonary:   [] Uses home oxygen   [] Productive cough   [] Hemoptysis   [] Wheeze  [] COPD   [] Asthma Neurologic:  [] Dizziness  [] Blackouts   [] Seizures   [] History of stroke   [] History of TIA  [] Aphasia   [] Temporary blindness   [] Dysphagia   [] Weakness or numbness in arms   [] Weakness or numbness in legs Musculoskeletal:  [] Arthritis   [] Joint swelling   [] Joint pain   [] Low back pain Hematologic:  [] Easy bruising  [] Easy bleeding   [] Hypercoagulable state   [] Anemic  [] Hepatitis Gastrointestinal:  [x] Blood in stool   [] Vomiting blood  [] Gastroesophageal reflux/heartburn   [] Difficulty swallowing. Genitourinary:  [] Chronic kidney disease   [] Difficult urination  [] Frequent urination  [] Burning with urination   [] Blood in urine Skin:  [] Rashes   [] Ulcers   [] Wounds Psychological:  [] History of anxiety   []  History of major depression.  Physical Examination  Vitals:   01/23/22 0723 01/23/22 0900  BP: (!) 142/89 140/74  Pulse: 83 85  Resp: 18 17  Temp: 98.3 F (36.8 C) 98.5 F (36.9 C)  TempSrc: Oral Oral  SpO2: 99% 99%   There is no height or weight on file to calculate BMI. Gen:  WD/WN, NAD Head: [place]/AT, No temporalis wasting. Prominent temp pulse not noted. Ear/Nose/Throat: Hearing grossly intact, nares w/o erythema or drainage, oropharynx w/o Erythema/Exudate Eyes: Sclera non-icteric, conjunctiva clear Neck: Trachea midline.  No JVD.  Pulmonary:  Good air movement, respirations not labored, equal bilaterally.  Cardiac: RRR, normal S1, S2. Vascular: No edema noted Vessel Right Left  Radial Palpable Palpable   Gastrointestinal: soft, non-tender/non-distended. No guarding/reflex.  Bleeding bright red blood from rectum with bowel movements Musculoskeletal: M/S 5/5 throughout.   Extremities without ischemic changes.  No deformity or atrophy. No edema. Neurologic: Sensation grossly intact in extremities.  Symmetrical.  Speech is fluent. Motor exam as listed above. Psychiatric: Judgment intact, Mood & affect appropriate for pt's clinical situation. Dermatologic: No rashes or ulcers noted.  No cellulitis or open wounds. Lymph : No Cervical, Axillary, or Inguinal lymphadenopathy.    CBC Lab Results  Component Value Date   WBC 9.2 01/23/2022   HGB 12.4 01/23/2022   HCT 35.7 (L) 01/23/2022   MCV 77.2 (L) 01/23/2022   PLT 270 01/23/2022    BMET    Component Value Date/Time   NA 140 01/23/2022 0801   K 3.6 01/23/2022 0801   CL 106 01/23/2022 0801   CO2 26 01/23/2022 0801   GLUCOSE 104 (H) 01/23/2022 0801   BUN 9 01/23/2022 0801   CREATININE 0.85 01/23/2022 0801   CALCIUM 9.5 01/23/2022 0801   GFRNONAA >60 01/23/2022 0801   Estimated Creatinine Clearance: 74.6 mL/min (by C-G formula based on SCr of 0.85 mg/dL).  COAG No results found for: "INR", "PROTIME"  Radiology CT ANGIO GI BLEED  Result Date: 01/23/2022 CLINICAL DATA:  Lower GI bleed acute EXAM: CTA ABDOMEN AND PELVIS WITHOUT AND WITH CONTRAST TECHNIQUE: Multidetector CT imaging of the abdomen and pelvis was performed using the standard protocol during bolus administration of intravenous contrast. Multiplanar reconstructed images and MIPs were obtained and reviewed to evaluate the vascular anatomy. RADIATION DOSE REDUCTION: This exam was performed according to the departmental dose-optimization program which includes automated exposure control, adjustment of the mA and/or kV according to patient size and/or use of iterative reconstruction technique. CONTRAST:  OMNIPAQUE IOHEXOL 350 MG/ML SOLN COMPARISON:  None Available. FINDINGS: VASCULAR Contrast extravasation is seen into the lumen of the colon at  the junction of the distal descending and proximal sigmoid colon on arterial phase imaging,  intraluminal contrast increases on venous phase imaging. Normal caliber abdominal aorta with mild atherosclerotic disease. Major branch vessels are patent with no evidence of significant stenosis. NON-VASCULAR Lower chest: Mild ground-glass opacities of the right lower lobe. Hepatobiliary: No suspicious liver lesions. Cholelithiasis with no gallbladder wall thickening. No biliary ductal dilation. Pancreas: Unremarkable. No pancreatic ductal dilatation or surrounding inflammatory changes. Spleen: Normal in size without focal abnormality. Adrenals/Urinary Tract: Adrenal glands are unremarkable. Kidneys are normal, without renal calculi, focal lesion, or hydronephrosis. Diverticulum of the posterior right wall of the bladder. Stomach/Bowel: Stomach is within normal limits. Appendix appears normal. Diverticulosis. No evidence of bowel wall thickening, distention, or inflammatory changes. Lymphatic: No enlarged lymph nodes seen in the abdomen or pelvis. Reproductive: Uterus and bilateral adnexa are unremarkable. Other: No intra-abdominal free air or free fluid. Musculoskeletal: No acute or significant osseous findings. IMPRESSION: VASCULAR 1. Active GI bleed involving the distal descending/proximal sigmoid colon. 2.  Aortic Atherosclerosis (ICD10-I70.0). NON-VASCULAR 1. Diverticulosis with no evidence of diverticulitis. 2. Gallstones. Critical Value/emergent results were called by telephone at the time of interpretation on 01/23/2022 at 9:17 am to provider [facility] , who verbally acknowledged these results. Electronically Signed   By: Allegra Lai M.D.   On: 01/23/2022 09:25      Assessment/Plan 1. GI Bleed Given the patient's active GI bleed involving the distal descending colon and proximal sigmoid colon we will attempt a embolization in order to gain control of the bleeding.  Currently the patient is hemodynamically stable.  We will have this procedure done today.  Discussed with patient and family  she is agreeable to proceed.  2.  Hypertension Currently the patient is normotensive.  Would hold blood pressure medication currently.   Family Communication: Husband is at bedside  Thank you for allowing [place] to participate in the care of this patient.   Georgiana Spinner, NP [facility] [phone] (Office Phone) [phone] (Office Fax) [phone] (Pager)  01/23/2022 11:57 AM  Staff may message me via secure chat in Epic  but this may not receive immediate response,  please page for urgent matters!  Dictation software was used to generate the above note. Typos may occur and escape review, as with typed/written notes. Any error is purely unintentional.  Please contact me directly for clarity if needed.

## 2022-01-23 NOTE — ED Triage Notes (Signed)
Pt c/o lower abd cramping with red blood in stool that started yesterday and today passing clots. Pt is in NAD on arrival.

## 2022-01-23 NOTE — ED Notes (Signed)
Pt has no co of dizziness , continues to use bathroom , vitals signs WDL.

## 2022-01-23 NOTE — Progress Notes (Signed)
Rapid response was called in the specials unit Patient admitted to the hospital for evaluation of diverticular bleed and had several episodes of bloody stools after admission.  She was being transferred to the specials unit for embolization when she became unresponsive and rapid response was called. Patient noted to be hypotensive with systolic blood pressure in the 90s.  IV fluids administered wide open Had a central line placed by vascular surgeon Patient will be transfused 2 units of packed RBC We will consult intensivist

## 2022-01-23 NOTE — Interval H&P Note (Signed)
History and Physical Interval Note:  01/23/2022 5:21 PM  Cheryl Coleman  has presented today for surgery, with the diagnosis of GI bleed.  The various methods of treatment have been discussed with the patient and family. After consideration of risks, benefits and other options for treatment, the patient has consented to  Procedure(s): EMBOLIZATION (N/A) as a surgical intervention.  The patient's history has been reviewed, patient examined, no change in status, stable for surgery.  I have reviewed the patient's chart and labs.  Questions were answered to the patient's satisfaction.     Levora Dredge

## 2022-01-23 NOTE — Consult Note (Signed)
NAMEBritaney Coleman, MRN:  [id number], DOB:  [birth year], LOS: 0 ADMISSION DATE:  01/23/2022, CONSULTATION DATE: January 23, 2022 REFERRING MD: Hospitalist, CHIEF COMPLAINT: GI bleed  History of Present Illness:  Patient came in with massive lower GI bleed bleeding per rectum multiple episodes of fresh blood stool resulted in dizziness and lightheadedness. She had history of GERD hiatal hernia diverticular disease history of previous bleed she has no abdominal pain nausea vomiting diarrhea.  She decides hematemesis or fall in the ED she had a rapid response when she became dizzy she was talking to the OR and embolus by vascular surgery she came to the ICU she had stable blood pressure and heart rate.  Objective   Blood pressure 125/75, pulse 85, temperature 97.7 F (36.5 C), temperature source Oral, resp. rate 14, SpO2 99 %.        Intake/Output Summary (Last 24 hours) at 01/23/2022 1542 Last data filed at 01/23/2022 1434 Gross per 24 hour  Intake 300 ml  Output --  Net 300 ml   There were no vitals filed for this visit.  Examination: Gen:  WD/WN, NAD Head: [place]/AT, No temporalis wasting. Prominent temp pulse not noted. Ear/Nose/Throat: Hearing grossly intact, nares w/o erythema or drainage, oropharynx w/o Erythema/Exudate Eyes: Sclera non-icteric, conjunctiva clear Neck: Trachea midline.  No JVD.  Pulmonary:  Good air movement, respirations not labored, equal bilaterally.  Cardiac: RRR, normal S1, S2. Gastrointestinal: soft, non-tender/non-distended. No guarding/reflex.  Bleeding bright red blood from rectum with bowel movements Musculoskeletal: M/S 5/5 throughout.  Extremities without ischemic changes.  No deformity or atrophy. No edema. Neurologic: Sensation grossly intact in extremities.  Symmetrical.  Speech is fluent. Motor exam as listed above. Psychiatric: Judgment intact, Mood & affect appropriate for pt's clinical situation. Dermatologic: No rashes or ulcers noted.  No cellulitis or  open wounds. Lymph : No Cervical, Axillary, or Inguinal lymphadenopathy.   Assessment & Plan:    -- Massive lower GI bleed diverticular bleed embolized seem to be stable check H&H transfuse as needed.  --Dizziness and lightheadedness related to postural changes stable at this point.  --Acute loss anemia hemorrhagic shock transfuse and check H&H  --All other issues are managed by the primary team call us with questions thank you for this consultation we will sign off for now patient is in stepdown unit will be followed by the hospitalist team tomorrow.  Best Practice (right click and "Reselect all SmartList Selections" daily)     Labs   CBC: Recent Labs  Lab 01/23/22 0801 01/23/22 0818  WBC 9.2  --   HGB 13.3 12.4  HCT 38.2 35.7*  MCV 77.2*  --   PLT 270  --     Basic Metabolic Panel: Recent Labs  Lab 01/23/22 0801  NA 140  K 3.6  CL 106  CO2 26  GLUCOSE 104*  BUN 9  CREATININE 0.85  CALCIUM 9.5   GFR: Estimated Creatinine Clearance: 74.6 mL/min (by C-G formula based on SCr of 0.85 mg/dL). Recent Labs  Lab 01/23/22 0801  WBC 9.2    Liver Function Tests: Recent Labs  Lab 01/23/22 0801  AST 22  ALT 17  ALKPHOS 63  BILITOT 0.7  PROT 8.6*  ALBUMIN 4.4   No results for input(s): "LIPASE", "AMYLASE" in the last 168 hours. No results for input(s): "AMMONIA" in the last 168 hours.  ABG No results found for: "PHART", "PCO2ART", "PO2ART", "HCO3", "TCO2", "ACIDBASEDEF", "O2SAT"   Coagulation Profile: No results for input(s): "INR", "  PROTIME" in the last 168 hours.  Cardiac Enzymes: No results for input(s): "CKTOTAL", "CKMB", "CKMBINDEX", "TROPONINI" in the last 168 hours.  HbA1C: Hgb A1c MFr Bld  Date/Time Value Ref Range Status  10/10/2021 08:21 AM 6.4 4.6 - 6.5 % Final    Comment:    Glycemic Control Guidelines for People with Diabetes:Non Diabetic:  <6%Goal of Therapy: <7%Additional Action Suggested:  >8%   07/08/2021 11:35 AM 6.0 4.6 - 6.5 %  Final    Comment:    Glycemic Control Guidelines for People with Diabetes:Non Diabetic:  <6%Goal of Therapy: <7%Additional Action Suggested:  >8%     CBG: Recent Labs  Lab 01/23/22 1530  GLUCAP 114*    Review of Systems:   Unable to obtain due to patient condition.  Past Medical History:  She,  has a past medical history of Arthritis, Asthma, COVID, COVID-19, Diverticulitis, GERD (gastroesophageal reflux disease), Hiatal hernia, History of blood transfusion, History of chicken pox, History of GI bleed, Hyperlipidemia, Hypertension, and Leukocytosis.   Surgical History:   Past Surgical History:  Procedure Laterality Date   BREAST CYST ASPIRATION Right    CESAREAN SECTION     04/27/84   COLONOSCOPY     ESOPHAGOGASTRODUODENOSCOPY (EGD) WITH PROPOFOL N/A 08/29/2020   Procedure: ESOPHAGOGASTRODUODENOSCOPY (EGD) WITH PROPOFOL;  Surgeon: Lin Landsman, MD;  Location: [facility] ENDOSCOPY;  Service: Gastroenterology;  Laterality: N/A;   ORIF ACETABULAR FRACTURE       Social History:   reports that she has never smoked. She has never used smokeless tobacco. She reports that she does not currently use alcohol. She reports that she does not currently use drugs.   Family History:  Her family history includes Diabetes in her maternal aunt; Hypertension in her maternal aunt. There is no history of Breast cancer.   Allergies Allergies  Allergen Reactions   Oxycodone Other (See Comments)    Feels bad     Home Medications  Prior to Admission medications   Medication Sig Start Date End Date Taking? Authorizing Provider  Acetaminophen (TYLENOL PO) Take by mouth.   Yes [provider]  Ascorbic Acid (VITAMIN C PO) Take by mouth daily.   Yes [provider]  atorvastatin (LIPITOR) 10 MG tablet TAKE 1 TABLET (10 MG TOTAL) BY MOUTH DAILY AT 6 PM. 09/04/21  Yes [name], Nino Glow, MD  CALCIUM PO Take by mouth daily.   Yes [provider]  citalopram (CELEXA)  10 MG tablet Take 1 tablet (10 mg total) by mouth daily. 10/14/21  Yes [name], Nino Glow, MD  diltiazem (CARDIZEM CD) 120 MG 24 hr capsule Take 1 capsule (120 mg total) by mouth daily. qhs 01/14/22  Yes [name], Nino Glow, MD  diltiazem (CARDIZEM CD) 240 MG 24 hr capsule Take 1 capsule (240 mg total) by mouth daily. In am 01/14/22  Yes [name], Nino Glow, MD  fluticasone (FLONASE) 50 MCG/ACT nasal spray SPRAY 2 SPRAYS INTO EACH NOSTRIL EVERY DAY 09/09/21  Yes [name], Nino Glow, MD  montelukast (SINGULAIR) 10 MG tablet TAKE 1 TABLET BY MOUTH EVERYDAY AT BEDTIME 09/15/21  Yes [name], Nino Glow, MD  Multiple Vitamins-Minerals (ZINC PO) Take by mouth daily.   Yes [provider]  olmesartan (BENICAR) 5 MG tablet Take 2 tablets (10 mg total) by mouth daily. 11/10/21  Yes [name], Nino Glow, MD  omeprazole (PRILOSEC) 40 MG capsule Take 1 capsule (40 mg total) by mouth daily. Qhs 30 min before food 01/14/22  Yes [name], Nino Glow, MD  sodium chloride (OCEAN) 0.65 % SOLN nasal spray Place 2 sprays into both nostrils as needed for congestion. Before flonase 01/14/22  Yes [name], [name], MD  Specialty Vitamins Products (WOMENS VITA PAK PO) Take by mouth daily.   Yes [provider]  VITAMIN D PO Take by mouth daily.   Yes [provider]  albuterol (VENTOLIN HFA) 108 (90 Base) MCG/ACT inhaler INHALE 1-2 PUFFS BY MOUTH EVERY 6 (SIX) HOURS AS NEEDED 01/14/22   [name], [name], MD  ALPRAZolam Prudy Feeler) 0.25 MG tablet Take 0.5-1 tablets (0.125-0.25 mg total) by mouth daily as needed for anxiety. Patient not taking: Reported on 01/14/2022 07/08/21   [name], [name], MD  Loratadine (CLARITIN PO) Take by mouth as needed. Patient not taking: Reported on 01/14/2022    [provider]  nystatin (MYCOSTATIN) 100000 UNIT/ML suspension Take 5 mLs (500,000 Units total) by mouth 4 (four) times daily. Swish, gargle and swallow  01/14/22   [name], [name], MD  Olopatadine HCl (PATADAY) 0.7 % SOLN Apply 1 drop to eye daily as needed. Patient not taking: Reported on 01/14/2022 10/14/21   [name], [name], MD

## 2022-01-23 NOTE — Assessment & Plan Note (Signed)
   Treated for active GI bleed,  acute blood loss anemia resulting in hemorrhagic shock, requiring 2 units packed red blood cells, underwent embolization 01/23/2022 with vascular surgery, H/H has remained stable  Monitor H/H  Advance diet  Resume home medications  Hemoglobin has remained stable greater than 12. Plan recheck H/H this evening and in a.m. and if remains stable can d/c tomorrow - pt agreeable to this plan.

## 2022-01-23 NOTE — Assessment & Plan Note (Addendum)
Patient is normotensive Hold olmesartan and diltiazem for now

## 2022-01-23 NOTE — H&P (Signed)
History and Physical    Patient: Cheryl Coleman BBC:[id number] DOB: 1956/01/13 DOA: 01/23/2022 DOS: the patient was seen and examined on 01/23/2022 PCP: [name], [name], MD  Patient coming from: Home  Chief Complaint:  Chief Complaint  Patient presents with   GI Bleeding   HPI: Cheryl Coleman is a 66 y.o. female with medical history significant for GERD, hiatal hernia, diverticular disease, hypertension who presents to the emergency room for evaluation of rectal bleeding which started 1 day prior to her admission. Patient states that she had an episode of rectal bleeding 1 day prior to her admission.  She denied having any abdominal pain with that episode and denied having any other symptoms. On the day of admission she states that she was getting ready to go to work when she wiped herself and saw some bright red blood.  She decided to come to the ER for evaluation and while in the ED she had an urge to defecate and had a large amount of bright red blood per rectum.  She denies feeling dizzy or lightheaded and denies having any chest pain, no shortness of breath, no falls or loss of consciousness. She denies any NSAID use and denies having any abdominal pain.  Denies having any urinary frequency, no nocturia, no dysuria, no leg swelling, no focal deficit     Review of Systems: As mentioned in the history of present illness. All other systems reviewed and are negative. Past Medical History:  Diagnosis Date   Arthritis    Asthma    COVID    COVID-19    07/24/20, 05/19/21   Diverticulitis    GERD (gastroesophageal reflux disease)    Hiatal hernia    History of blood transfusion    History of chicken pox    History of GI bleed    2013/14   Hyperlipidemia    Hypertension    Leukocytosis    Past Surgical History:  Procedure Laterality Date   BREAST CYST ASPIRATION Right    CESAREAN SECTION     04/27/84   COLONOSCOPY     ESOPHAGOGASTRODUODENOSCOPY (EGD) WITH PROPOFOL N/A 08/29/2020    Procedure: ESOPHAGOGASTRODUODENOSCOPY (EGD) WITH PROPOFOL;  Surgeon: [name], MD;  Location: [facility] ENDOSCOPY;  Service: Gastroenterology;  Laterality: N/A;   ORIF ACETABULAR FRACTURE     Social History:  reports that she has never smoked. She has never used smokeless tobacco. She reports that she does not currently use alcohol. She reports that she does not currently use drugs.  Allergies  Allergen Reactions   Oxycodone Other (See Comments)    Feels bad    Family History  Problem Relation Age of Onset   Hypertension Maternal Aunt    Diabetes Maternal Aunt    Breast cancer Neg Hx     Prior to Admission medications   Medication Sig Start Date End Date Taking? Authorizing Provider  Acetaminophen (TYLENOL PO) Take by mouth.    [provider]  albuterol (VENTOLIN HFA) 108 (90 Base) MCG/ACT inhaler INHALE 1-2 PUFFS BY MOUTH EVERY 6 (SIX) HOURS AS NEEDED 01/14/22   [name], [name], MD  ALPRAZolam Prudy Feeler) 0.25 MG tablet Take 0.5-1 tablets (0.125-0.25 mg total) by mouth daily as needed for anxiety. Patient not taking: Reported on 01/14/2022 07/08/21   [name], [name], MD  Ascorbic Acid (VITAMIN C PO) Take by mouth daily.    [provider]  atorvastatin (LIPITOR) 10 MG tablet TAKE 1 TABLET (10 MG TOTAL) BY MOUTH DAILY AT 6  PM. 09/04/21   [name], [name], MD  CALCIUM PO Take by mouth daily.    [provider]  citalopram (CELEXA) 10 MG tablet Take 1 tablet (10 mg total) by mouth daily. 10/14/21   [name], [name], MD  diltiazem (CARDIZEM CD) 120 MG 24 hr capsule Take 1 capsule (120 mg total) by mouth daily. qhs 01/14/22   [name], [name], MD  diltiazem (CARDIZEM CD) 240 MG 24 hr capsule Take 1 capsule (240 mg total) by mouth daily. In am 01/14/22   [name], [name], MD  fluticasone [facility]) 50 MCG/ACT nasal spray SPRAY 2 SPRAYS INTO EACH NOSTRIL EVERY DAY 09/09/21   [name], [name], MD  Loratadine  (CLARITIN PO) Take by mouth as needed. Patient not taking: Reported on 01/14/2022    [provider]  montelukast (SINGULAIR) 10 MG tablet TAKE 1 TABLET BY MOUTH EVERYDAY AT BEDTIME 09/15/21   [name], [name], MD  Multiple Vitamins-Minerals (ZINC PO) Take by mouth daily.    [provider]  nystatin (MYCOSTATIN) 100000 UNIT/ML suspension Take 5 mLs (500,000 Units total) by mouth 4 (four) times daily. Swish, gargle and swallow 01/14/22   [name], [name], MD  olmesartan (BENICAR) 5 MG tablet Take 2 tablets (10 mg total) by mouth daily. 11/10/21   [name], [name], MD  Olopatadine HCl (PATADAY) 0.7 % SOLN Apply 1 drop to eye daily as needed. Patient not taking: Reported on 01/14/2022 10/14/21   [name], [name], MD  omeprazole (PRILOSEC) 40 MG capsule Take 1 capsule (40 mg total) by mouth daily. Qhs 30 min before food 01/14/22   [name], [name], MD  sodium chloride (OCEAN) 0.65 % SOLN nasal spray Place 2 sprays into both nostrils as needed for congestion. Before flonase 01/14/22   [name], [name], MD  Specialty Vitamins Products (WOMENS VITA PAK PO) Take by mouth daily.    [provider]  VITAMIN D PO Take by mouth daily.    [provider]    Physical Exam: Vitals:   01/23/22 0723 01/23/22 0900  BP: (!) 142/89 140/74  Pulse: 83 85  Resp: 18 17  Temp: 98.3 F (36.8 C) 98.5 F (36.9 C)  TempSrc: Oral Oral  SpO2: 99% 99%   Physical Exam Vitals and nursing note reviewed.  Constitutional:      Appearance: Normal appearance.  HENT:     Head: Normocephalic and atraumatic.     Nose: Nose normal.     Mouth/Throat:     Mouth: Mucous membranes are moist.  Eyes:     Pupils: Pupils are equal, round, and reactive to light.  Cardiovascular:     Rate and Rhythm: Normal rate and regular rhythm.  Pulmonary:     Effort: Pulmonary effort is normal.     Breath sounds: Normal breath sounds.  Abdominal:     General:  Bowel sounds are normal.     Palpations: Abdomen is soft.     Comments: Central adiposity  Musculoskeletal:        General: Normal range of motion.     Cervical back: Normal range of motion and neck supple.  Skin:    General: Skin is warm and dry.  Neurological:     General: No focal deficit present.     Mental Status: She is alert.  Psychiatric:        Mood and Affect: Mood normal.        Behavior: Behavior normal.     Data Reviewed: Relevant  notes from primary care and specialist visits, past discharge summaries as available in EHR, including Care Everywhere. Prior diagnostic testing as pertinent to current admission diagnoses Updated medications and problem lists for reconciliation ED course, including vitals, labs, imaging, treatment and response to treatment Triage notes, nursing and pharmacy notes and ED provider's notes Notable results as noted in HPI Labs reviewed.  Sodium 140, potassium 3.6, chloride 106, bicarb 26, glucose 104, BUN 9, creatinine 0.85, calcium 9.5, total protein 8.6, albumin 4.4, AST 22, ALT 17, alkaline phosphatase 63, total bilirubin 0.7, white count 9.2, hemoglobin 13.3, hematocrit 38.2, MCV 77, RDW 15.1, platelet count 270 CT scan of abdomen and pelvis shows active GI bleed involving the distal descending/proximal sigmoid colon.Aortic Atherosclerosis (ICD10-I70. Diverticulosis with no evidence of diverticulitis. Gallstones.  There are no new results to review at this time.  Assessment and Plan: * Rectal bleeding Patient presents to the ER for evaluation of several episodes of painless rectal bleeding She has a known history of diverticulosis Imaging shows active GI bleed involving the distal descending/proximal sigmoid colon Monitor serial H&H IV fluid resuscitation Transfuse for hemoglobin less than 7 Vascular surgery consult  Mild intermittent asthma Stable and not acutely exacerbated Continue as needed bronchodilator therapy  Essential  hypertension Patient is normotensive Hold olmesartan and diltiazem for now      Advance Care Planning:   Code Status: Full Code   Consults: Vascular surgery:  Family Communication: Greater than 50% of time was spent discussing patient's condition and plan of care with her at the bedside.  All questions and concerns have been addressed.  She verbalizes understanding and agrees with the plan.  Severity of Illness: The appropriate patient status for this patient is INPATIENT. Inpatient status is judged to be reasonable and necessary in order to provide the required intensity of service to ensure the patient's safety. The patient's presenting symptoms, physical exam findings, and initial radiographic and laboratory data in the context of their chronic comorbidities is felt to place them at high risk for further clinical deterioration. Furthermore, it is not anticipated that the patient will be medically stable for discharge from the hospital within 2 midnights of admission.   * I certify that at the point of admission it is my clinical judgment that the patient will require inpatient hospital care spanning beyond 2 midnights from the point of admission due to high intensity of service, high risk for further deterioration and high frequency of surveillance required.*  Author: Lucile Shutters, MD 01/23/2022 10:32 AM  For on call review [email or web address].

## 2022-01-23 NOTE — ED Provider Notes (Signed)
   [facility] Provider Note    Event Date/Time   First MD Initiated Contact with Patient 01/23/22 [phone]     (approximate)  History   Chief Complaint: GI Bleeding  HPI  Cheryl Coleman is a 66 y.o. female with a past medical history of arthritis, asthma, hypertension, hyperlipidemia, prior GI bleed 2014 who presents to the emergency department for GI bleeding.  According to the patient yesterday she had a bowel movement she noted some bright red blood within the bowel movement.  She states overnight and this morning she has had multiple additional bowel movements that have been all blood with clots.  Denies any abdominal pain although does state a sensation to have a bowel movement currently.  Patient states previously she has had a GI bleed requiring blood transfusion.  Patient denies any nausea or vomiting.  No anticoagulation.  Physical Exam   Triage Vital Signs: ED Triage Vitals [01/23/22 0723]  Enc Vitals Group     BP (!) 142/89     Pulse Rate 83     Resp 18     Temp 98.3 F (36.8 C)     Temp Source Oral     SpO2 99 %     Weight      Height      Head Circumference      Peak Flow      Pain Score      Pain Loc      Pain Edu?      Excl. in GC?     Most recent vital signs: Vitals:   01/23/22 0723  BP: (!) 142/89  Pulse: 83  Resp: 18  Temp: 98.3 F (36.8 C)  SpO2: 99%    General: Awake, no distress.  CV:  Good peripheral perfusion.  Regular rate and rhythm  Resp:  Normal effort.  Equal breath sounds bilaterally.  Abd:  No distention.  Soft, nontender.  No rebound or guarding. Other:  Rectal examination shows small amount of hemorrhoids, gross hematochezia.   ED Results / Procedures / Treatments   RADIOLOGY  I have viewed and interpreted the CT images I do not see any at least large bleed on my evaluation. Radiology has read the CT as active GI bleed from the distal descending/proximal sigmoid colon   MEDICATIONS ORDERED IN  ED: Medications - No data to display   IMPRESSION / MDM / ASSESSMENT AND PLAN / ED COURSE  I reviewed the triage vital signs and the nursing notes.  Patient's presentation is most consistent with acute presentation with potential threat to life or bodily function.  Patient presents emergency department for rectal bleeding since last night.  Examination shows gross hematochezia.  Nontender abdomen.  We will check labs including a type and screen.  Given the degree of bleeding on rectal examination we will obtain a CT scan GI protocol  CMP shows no concerning findings.  CBC shows normal H&H.  Patient CTA is positive for distal descending/proximal sigmoid bleed.  I spoken to the hospitalist was agreed to admit the patient to their service.  I will also speak to vascular given the CTA findings.  FINAL CLINICAL IMPRESSION(S) / ED DIAGNOSES   GI bleed    Note:  This document was prepared using Dragon voice recognition software and may include unintentional dictation errors.   Minna Antis, MD 01/23/22 [phone]

## 2022-01-23 NOTE — Op Note (Signed)
[facility]  Percutaneous Study/Intervention Procedural Note     Surgeon(s): Best boy: none  Pre-operative Diagnosis: 1. Lower GI bleed 2.  Hypotensive shock  Post-operative diagnosis:  Same  Procedure(s) Performed:             1.  Ultrasound guidance for vascular access right femoral artery             2.  Catheter placement into inferior mesenteric artery and its distal branches             3.  Aortogram and selective angiogram of the inferior mesenteric artery             4.  Microbead embolization of the left colic and to associated sigmoid arteries with a total of 2 cc of 500-700  polyvinyl alcohol beads.             5.  StarClose closure device right femoral artery  Anesthesia: Continuous ECG pulse oximetry and cardiopulmonary monitoring was performed throughout the entire procedure by the interventional radiology nurse.  Parenteral Versed and fentanyl were utilized.  Total sedation time was 30 minutes          EBL: 10 cc procedurally  Fluoro Time: 3.7 minutes  Contrast: 55 cc              Indications:  Patient is a 66 y.o.female with brisk lower GI bleeding with resultant anemia. The patient has a CT angiogram showing distal descending and sigmoid colon as the source. The patient is brought in for angiography for further evaluation and potential treatment. Risks and benefits are discussed and informed consent is obtained  Procedure:  The patient was identified and appropriate procedural time out was performed.  The patient was then placed supine on the table and prepped and draped in the usual sterile fashion. Moderate conscious sedation was administered during a face to face encounter with the patient throughout the procedure with my supervision of the RN administering medicines and monitoring the patient's vital signs, pulse oximetry, telemetry and mental status throughout from the start of the procedure until the patient was  taken to the recovery room.  Ultrasound was used to evaluate the right common femoral artery.  It was patent .  A digital ultrasound image was acquired.  A micropuncture needle was used to access the right common femoral artery under direct ultrasound guidance and a permanent image was performed.  A microwire was advanced under fluoroscopic guidance followed by a microsheath.  A 0.035 J wire was advanced without resistance and a 5Fr sheath was placed.  Pigtail catheter was placed into the aorta and an RAO aortogram was performed. This demonstrated the origin of the inferior mesenteric artery.  A V S1 catheter was used to selectively cannulate the IMA.  Hand-injection contrast was then utilized to demonstrate the more proximal anatomy patency with the location of the sigmoidal arteries as well as the left colic.   Based on her continued bleeding and the nuclear medicine study I elected to treat this area with embolization. I initially advanced the Pro-Great microcatheter out the proximal sigmoidal artery and performed selective angiography.  I then instilled approximately 1 cc of medium PVC beads in this location. Angiogram following this showed the main vessels to be open with less brisk filling. I then pulled back and cannulated the mid sigmoidal artery with the Pro-great microcatheter without difficulty. Selective imaging was performed and subsequently 1/2 cc of medium PVC beads  was instilled.  Follow-up imaging demonstrated patency of the larger branches but significantly less brisk filling.  Lastly I selected the middle colic artery.  Hand-injection contrast was used to demonstrate the distal anatomy.  I infused 1/2 cc of medium PVC beads.  Follow-up imaging demonstrated patency of the large branches with significantly less brisk filling distally.  At this point I elected to terminate the procedure.  The diagnostic catheter was removed. StarClose closure device was deployed in usual fashion with excellent  hemostatic result. The patient was taken to the recovery room in stable condition having tolerated the procedure well.     Findings: The proximal portions of the inferior mesenteric artery and its branches were widely patent.  Based on the findings from the CT as well as the patient's profound bleeding to hypotension I elected to treat the distal descending and sigmoid colon.  3 individual branches were selected the left colic the proximal sigmoidal artery and the mid sigmoidal artery.  A total of 2 cc of PVC beads were infused.  Disposition: Patient was taken to the recovery room in stable condition having tolerated the procedure well.  Complications:  None  Levora Dredge 01/23/2022 5:25 PM   This note was created with Dragon Medical transcription system. Any errors in dictation are purely unintentional.

## 2022-01-23 NOTE — Telephone Encounter (Signed)
Pt is at [facility]  ER with GI bleed she feels that she will be admitted.

## 2022-01-23 NOTE — Assessment & Plan Note (Signed)
Stable and not acutely exacerbated Continue as needed bronchodilator therapy 

## 2022-01-23 NOTE — Significant Event (Signed)
Rapid Response Event Note   Reason for Call : Active GI bleed from ED, hypotension  Initial Focused Assessment: Patient in trendelenburg, pale, with unstable vital signs on the monitor  Interventions: Dr. Lorretta Harp at the bedside preparing to place a central line then take to embolize the bleed.  Plan of Care: patient will have a repair complete then be transferred to ICU post op  Event Summary: special procedure RN confirmed ICU charge no longer needed at bedside  MD Notified: Dr. At the bedside Call Time: Arrival Time: 1205 End Time: 1210  Astrid Drafts, RN

## 2022-01-23 NOTE — Consult Note (Incomplete)
[facility] Vascular Consult Note  MRN : [id number]  Cheryl Coleman is a 66 y.o. (1956-01-07) female who presents with chief complaint of  Chief Complaint  Patient presents with   GI Bleeding  .   Consulting Physician: Reason for consult: History of Present Illness: ***  Current Facility-Administered Medications  Medication Dose Route Frequency Provider Last Rate Last Admin   0.9 %  sodium chloride infusion   Intravenous Continuous Agbata, Tochukwu, MD 100 mL/hr at 01/23/22 1047 New Bag at 01/23/22 1047   albuterol (PROVENTIL) (2.5 MG/3ML) 0.083% nebulizer solution 2.5 mg  2.5 mg Inhalation Q4H PRN Agbata, Tochukwu, MD       fluticasone (FLONASE) 50 MCG/ACT nasal spray 1 spray  1 spray Each Nare Daily Agbata, Tochukwu, MD       Current Outpatient Medications  Medication Sig Dispense Refill   Acetaminophen (TYLENOL PO) Take by mouth.     Ascorbic Acid (VITAMIN C PO) Take by mouth daily.     atorvastatin (LIPITOR) 10 MG tablet TAKE 1 TABLET (10 MG TOTAL) BY MOUTH DAILY AT 6 PM. 90 tablet 3   CALCIUM PO Take by mouth daily.     citalopram (CELEXA) 10 MG tablet Take 1 tablet (10 mg total) by mouth daily. 90 tablet 3   diltiazem (CARDIZEM CD) 120 MG 24 hr capsule Take 1 capsule (120 mg total) by mouth daily. qhs 90 capsule 3   diltiazem (CARDIZEM CD) 240 MG 24 hr capsule Take 1 capsule (240 mg total) by mouth daily. In am 90 capsule 3   fluticasone (FLONASE) 50 MCG/ACT nasal spray SPRAY 2 SPRAYS INTO EACH NOSTRIL EVERY DAY 48 mL 3   montelukast (SINGULAIR) 10 MG tablet TAKE 1 TABLET BY MOUTH EVERYDAY AT BEDTIME 90 tablet 3   Multiple Vitamins-Minerals (ZINC PO) Take by mouth daily.     olmesartan (BENICAR) 5 MG tablet Take 2 tablets (10 mg total) by mouth daily. 180 tablet 3   omeprazole (PRILOSEC) 40 MG capsule Take 1 capsule (40 mg total) by mouth daily. Qhs 30 min before food 90 capsule 3   sodium chloride (OCEAN) 0.65 % SOLN nasal spray Place 2 sprays into both  nostrils as needed for congestion. Before flonase 90 mL 3   Specialty Vitamins Products (WOMENS VITA PAK PO) Take by mouth daily.     VITAMIN D PO Take by mouth daily.     albuterol (VENTOLIN HFA) 108 (90 Base) MCG/ACT inhaler INHALE 1-2 PUFFS BY MOUTH EVERY 6 (SIX) HOURS AS NEEDED 18 each 11   ALPRAZolam (XANAX) 0.25 MG tablet Take 0.5-1 tablets (0.125-0.25 mg total) by mouth daily as needed for anxiety. (Patient not taking: Reported on 01/14/2022) 30 tablet 5   Loratadine (CLARITIN PO) Take by mouth as needed. (Patient not taking: Reported on 01/14/2022)     nystatin (MYCOSTATIN) 100000 UNIT/ML suspension Take 5 mLs (500,000 Units total) by mouth 4 (four) times daily. Swish, gargle and swallow 473 mL 0   Olopatadine HCl (PATADAY) 0.7 % SOLN Apply 1 drop to eye daily as needed. (Patient not taking: Reported on 01/14/2022) 2.5 mL 11    Past Medical History:  Diagnosis Date   Arthritis    Asthma    COVID    COVID-19    07/24/20, 05/19/21   Diverticulitis    GERD (gastroesophageal reflux disease)    Hiatal hernia    History of blood transfusion    History of chicken pox    History of GI bleed  2013/14   Hyperlipidemia    Hypertension    Leukocytosis     Past Surgical History:  Procedure Laterality Date   BREAST CYST ASPIRATION Right    CESAREAN SECTION     04/27/84   COLONOSCOPY     ESOPHAGOGASTRODUODENOSCOPY (EGD) WITH PROPOFOL N/A 08/29/2020   Procedure: ESOPHAGOGASTRODUODENOSCOPY (EGD) WITH PROPOFOL;  Surgeon: [name], MD;  Location: [facility] ENDOSCOPY;  Service: Gastroenterology;  Laterality: N/A;   ORIF ACETABULAR FRACTURE      Social History Social History   Tobacco Use   Smoking status: Never   Smokeless tobacco: Never  Vaping Use   Vaping Use: Never used  Substance Use Topics   Alcohol use: Not Currently   Drug use: Not Currently    Family History Family History  Problem Relation Age of Onset   Hypertension Maternal Aunt    Diabetes Maternal Aunt     Breast cancer Neg Hx     Allergies  Allergen Reactions   Oxycodone Other (See Comments)    Feels bad     REVIEW OF SYSTEMS (Negative unless checked)  Constitutional: [] Weight loss  [] Fever  [] Chills Cardiac: [] Chest pain   [] Chest pressure   [] Palpitations   [] Shortness of breath when laying flat   [] Shortness of breath at rest   [] Shortness of breath with exertion. Vascular:  [] Pain in legs with walking   [] Pain in legs at rest   [] Pain in legs when laying flat   [] Claudication   [] Pain in feet when walking  [] Pain in feet at rest  [] Pain in feet when laying flat   [] History of DVT   [] Phlebitis   [] Swelling in legs   [] Varicose veins   [] Non-healing ulcers Pulmonary:   [] Uses home oxygen   [] Productive cough   [] Hemoptysis   [] Wheeze  [] COPD   [] Asthma Neurologic:  [] Dizziness  [] Blackouts   [] Seizures   [] History of stroke   [] History of TIA  [] Aphasia   [] Temporary blindness   [] Dysphagia   [] Weakness or numbness in arms   [] Weakness or numbness in legs Musculoskeletal:  [] Arthritis   [] Joint swelling   [] Joint pain   [] Low back pain Hematologic:  [] Easy bruising  [] Easy bleeding   [] Hypercoagulable state   [] Anemic  [] Hepatitis Gastrointestinal:  [] Blood in stool   [] Vomiting blood  [] Gastroesophageal reflux/heartburn   [] Difficulty swallowing. Genitourinary:  [] Chronic kidney disease   [] Difficult urination  [] Frequent urination  [] Burning with urination   [] Blood in urine Skin:  [] Rashes   [] Ulcers   [] Wounds Psychological:  [] History of anxiety   []  History of major depression.  Physical Examination  Vitals:   01/23/22 0723 01/23/22 0900  BP: (!) 142/89 140/74  Pulse: 83 85  Resp: 18 17  Temp: 98.3 F (36.8 C) 98.5 F (36.9 C)  TempSrc: Oral Oral  SpO2: 99% 99%   There is no height or weight on file to calculate BMI. Gen:  WD/WN, NAD Head: [place]/AT, No temporalis wasting. Prominent temp pulse not noted. Ear/Nose/Throat: Hearing grossly intact, nares w/o erythema or  drainage, oropharynx w/o Erythema/Exudate Eyes: Sclera non-icteric, conjunctiva clear Neck: Trachea midline.  No JVD.  Pulmonary:  Good air movement, respirations not labored, equal bilaterally.  Cardiac: RRR, normal S1, S2. Vascular: *** Vessel Right Left  Radial Palpable Palpable  Ulnar Palpable Palpable  Brachial Palpable Palpable  Carotid Palpable, without bruit Palpable, without bruit  Aorta Not palpable N/A  Femoral Palpable Palpable  Popliteal Palpable Palpable  PT Palpable Palpable  DP Palpable Palpable   Gastrointestinal: soft, non-tender/non-distended. No guarding/reflex.  Musculoskeletal: M/S 5/5 throughout.  Extremities without ischemic changes.  No deformity or atrophy. No edema. Neurologic: Sensation grossly intact in extremities.  Symmetrical.  Speech is fluent. Motor exam as listed above. Psychiatric: Judgment intact, Mood & affect appropriate for pt's clinical situation. Dermatologic: No rashes or ulcers noted.  No cellulitis or open wounds. Lymph : No Cervical, Axillary, or Inguinal lymphadenopathy.    CBC Lab Results  Component Value Date   WBC 9.2 01/23/2022   HGB 12.4 01/23/2022   HCT 35.7 (L) 01/23/2022   MCV 77.2 (L) 01/23/2022   PLT 270 01/23/2022    BMET    Component Value Date/Time   NA 140 01/23/2022 0801   K 3.6 01/23/2022 0801   CL 106 01/23/2022 0801   CO2 26 01/23/2022 0801   GLUCOSE 104 (H) 01/23/2022 0801   BUN 9 01/23/2022 0801   CREATININE 0.85 01/23/2022 0801   CALCIUM 9.5 01/23/2022 0801   GFRNONAA >60 01/23/2022 0801   Estimated Creatinine Clearance: 74.6 mL/min (by C-G formula based on SCr of 0.85 mg/dL).  COAG No results found for: "INR", "PROTIME"  Radiology CT ANGIO GI BLEED  Result Date: 01/23/2022 CLINICAL DATA:  Lower GI bleed acute EXAM: CTA ABDOMEN AND PELVIS WITHOUT AND WITH CONTRAST TECHNIQUE: Multidetector CT imaging of the abdomen and pelvis was performed using the standard protocol during bolus administration  of intravenous contrast. Multiplanar reconstructed images and MIPs were obtained and reviewed to evaluate the vascular anatomy. RADIATION DOSE REDUCTION: This exam was performed according to the departmental dose-optimization program which includes automated exposure control, adjustment of the mA and/or kV according to patient size and/or use of iterative reconstruction technique. CONTRAST:  OMNIPAQUE IOHEXOL 350 MG/ML SOLN COMPARISON:  None Available. FINDINGS: VASCULAR Contrast extravasation is seen into the lumen of the colon at the junction of the distal descending and proximal sigmoid colon on arterial phase imaging, intraluminal contrast increases on venous phase imaging. Normal caliber abdominal aorta with mild atherosclerotic disease. Major branch vessels are patent with no evidence of significant stenosis. NON-VASCULAR Lower chest: Mild ground-glass opacities of the right lower lobe. Hepatobiliary: No suspicious liver lesions. Cholelithiasis with no gallbladder wall thickening. No biliary ductal dilation. Pancreas: Unremarkable. No pancreatic ductal dilatation or surrounding inflammatory changes. Spleen: Normal in size without focal abnormality. Adrenals/Urinary Tract: Adrenal glands are unremarkable. Kidneys are normal, without renal calculi, focal lesion, or hydronephrosis. Diverticulum of the posterior right wall of the bladder. Stomach/Bowel: Stomach is within normal limits. Appendix appears normal. Diverticulosis. No evidence of bowel wall thickening, distention, or inflammatory changes. Lymphatic: No enlarged lymph nodes seen in the abdomen or pelvis. Reproductive: Uterus and bilateral adnexa are unremarkable. Other: No intra-abdominal free air or free fluid. Musculoskeletal: No acute or significant osseous findings. IMPRESSION: VASCULAR 1. Active GI bleed involving the distal descending/proximal sigmoid colon. 2.  Aortic Atherosclerosis (ICD10-I70.0). NON-VASCULAR 1. Diverticulosis with no  evidence of diverticulitis. 2. Gallstones. Critical Value/emergent results were called by telephone at the time of interpretation on 01/23/2022 at 9:17 am to provider [facility] , who verbally acknowledged these results. Electronically Signed   By: Allegra Lai M.D.   On: 01/23/2022 09:25      Assessment/Plan 1. GI Bleed 2. *** 3. ***  Family Communication:  Thank you for allowing [place] to participate in the care of this patient.   Georgiana Spinner, NP [facility] [phone] Scientist, research (physical sciences) Phone) [phone] (Office Fax) [phone] (  Pager)  01/23/2022 11:32 AM  Staff may message me via secure chat in Epic  but this may not receive immediate response,  please page for urgent matters!  Dictation software was used to generate the above note. Typos may occur and escape review, as with typed/written notes. Any error is purely unintentional.  Please contact me directly for clarity if needed.

## 2022-01-23 NOTE — H&P (View-Only) (Signed)
[facility] Vascular Consult Note  MRN : [id number]  Cheryl Coleman is a 66 y.o. (May 28, 1956) female who presents with chief complaint of  Chief Complaint  Patient presents with   GI Bleeding  .   Consulting Physician: Lucile Shutters, MD Reason for consult: GI bleed History of Present Illness: Cheryl Coleman is a 67 year old female with past medical history significant for GERD, diverticular disease, hypertension, hiatal hernia who presented to [facility] on 01/23/2022 in regards to a GI bleed.  She notes that she began bleeding bright red blood from her rectum with bowel movements on 01/22/2022.  She denies any abdominal pain currently.  The patient notes that she has previously had a GI bleed and the symptoms are consistent with her previous GI bleed.  Currently she is hemodynamically stable.  She denies any dizziness or lightheadedness.  She has no chest pain, shortness of breath or abdominal pain.  Current Facility-Administered Medications  Medication Dose Route Frequency Provider Last Rate Last Admin   ceFAZolin (ANCEF) 2-4 GM/100ML-% IVPB            0.9 %  sodium chloride infusion   Intravenous Continuous Georgiana Spinner, NP       0.9 %  sodium chloride infusion   Intravenous Continuous Agbata, Tochukwu, MD 100 mL/hr at 01/23/22 1047 New Bag at 01/23/22 1047   albuterol (PROVENTIL) (2.5 MG/3ML) 0.083% nebulizer solution 2.5 mg  2.5 mg Inhalation Q4H PRN Agbata, Tochukwu, MD       ceFAZolin (ANCEF) IVPB 2g/100 mL premix  2 g Intravenous 30 min Pre-Op Georgiana Spinner, NP       diphenhydrAMINE (BENADRYL) injection 50 mg  50 mg Intravenous Once PRN Georgiana Spinner, NP       famotidine (PEPCID) tablet 40 mg  40 mg Oral Once PRN Georgiana Spinner, NP       fluticasone (FLONASE) 50 MCG/ACT nasal spray 1 spray  1 spray Each Nare Daily Agbata, Tochukwu, MD       HYDROmorphone (DILAUDID) injection 1 mg  1 mg Intravenous Once PRN Georgiana Spinner, NP        methylPREDNISolone sodium succinate (SOLU-MEDROL) 125 mg/2 mL injection 125 mg  125 mg Intravenous Once PRN Georgiana Spinner, NP       midazolam (VERSED) 2 MG/ML syrup 8 mg  8 mg Oral Once PRN Georgiana Spinner, NP       ondansetron [facility]) injection 4 mg  4 mg Intravenous Q6H PRN Georgiana Spinner, NP       Current Outpatient Medications  Medication Sig Dispense Refill   Acetaminophen (TYLENOL PO) Take by mouth.     Ascorbic Acid (VITAMIN C PO) Take by mouth daily.     atorvastatin (LIPITOR) 10 MG tablet TAKE 1 TABLET (10 MG TOTAL) BY MOUTH DAILY AT 6 PM. 90 tablet 3   CALCIUM PO Take by mouth daily.     citalopram (CELEXA) 10 MG tablet Take 1 tablet (10 mg total) by mouth daily. 90 tablet 3   diltiazem (CARDIZEM CD) 120 MG 24 hr capsule Take 1 capsule (120 mg total) by mouth daily. qhs 90 capsule 3   diltiazem (CARDIZEM CD) 240 MG 24 hr capsule Take 1 capsule (240 mg total) by mouth daily. In am 90 capsule 3   fluticasone (FLONASE) 50 MCG/ACT nasal spray SPRAY 2 SPRAYS INTO EACH NOSTRIL EVERY DAY 48 mL 3   montelukast (SINGULAIR) 10 MG tablet TAKE 1 TABLET  BY MOUTH EVERYDAY AT BEDTIME 90 tablet 3   Multiple Vitamins-Minerals (ZINC PO) Take by mouth daily.     olmesartan (BENICAR) 5 MG tablet Take 2 tablets (10 mg total) by mouth daily. 180 tablet 3   omeprazole (PRILOSEC) 40 MG capsule Take 1 capsule (40 mg total) by mouth daily. Qhs 30 min before food 90 capsule 3   sodium chloride (OCEAN) 0.65 % SOLN nasal spray Place 2 sprays into both nostrils as needed for congestion. Before flonase 90 mL 3   Specialty Vitamins Products (WOMENS VITA PAK PO) Take by mouth daily.     VITAMIN D PO Take by mouth daily.     albuterol (VENTOLIN HFA) 108 (90 Base) MCG/ACT inhaler INHALE 1-2 PUFFS BY MOUTH EVERY 6 (SIX) HOURS AS NEEDED 18 each 11   ALPRAZolam (XANAX) 0.25 MG tablet Take 0.5-1 tablets (0.125-0.25 mg total) by mouth daily as needed for anxiety. (Patient not taking: Reported on 01/14/2022) 30 tablet 5    Loratadine (CLARITIN PO) Take by mouth as needed. (Patient not taking: Reported on 01/14/2022)     nystatin (MYCOSTATIN) 100000 UNIT/ML suspension Take 5 mLs (500,000 Units total) by mouth 4 (four) times daily. Swish, gargle and swallow 473 mL 0   Olopatadine HCl (PATADAY) 0.7 % SOLN Apply 1 drop to eye daily as needed. (Patient not taking: Reported on 01/14/2022) 2.5 mL 11    Past Medical History:  Diagnosis Date   Arthritis    Asthma    COVID    COVID-19    07/24/20, 05/19/21   Diverticulitis    GERD (gastroesophageal reflux disease)    Hiatal hernia    History of blood transfusion    History of chicken pox    History of GI bleed    2013/14   Hyperlipidemia    Hypertension    Leukocytosis     Past Surgical History:  Procedure Laterality Date   BREAST CYST ASPIRATION Right    CESAREAN SECTION     04/27/84   COLONOSCOPY     ESOPHAGOGASTRODUODENOSCOPY (EGD) WITH PROPOFOL N/A 08/29/2020   Procedure: ESOPHAGOGASTRODUODENOSCOPY (EGD) WITH PROPOFOL;  Surgeon: [name], MD;  Location: [facility] ENDOSCOPY;  Service: Gastroenterology;  Laterality: N/A;   ORIF ACETABULAR FRACTURE      Social History Social History   Tobacco Use   Smoking status: Never   Smokeless tobacco: Never  Vaping Use   Vaping Use: Never used  Substance Use Topics   Alcohol use: Not Currently   Drug use: Not Currently    Family History Family History  Problem Relation Age of Onset   Hypertension Maternal Aunt    Diabetes Maternal Aunt    Breast cancer Neg Hx     Allergies  Allergen Reactions   Oxycodone Other (See Comments)    Feels bad     REVIEW OF SYSTEMS (Negative unless checked)  Constitutional: [] Weight loss  [] Fever  [] Chills Cardiac: [] Chest pain   [] Chest pressure   [] Palpitations   [] Shortness of breath when laying flat   [] Shortness of breath at rest   [] Shortness of breath with exertion. Vascular:  [] Pain in legs with walking   [] Pain in legs at rest   [] Pain in legs when  laying flat   [] Claudication   [] Pain in feet when walking  [] Pain in feet at rest  [] Pain in feet when laying flat   [] History of DVT   [] Phlebitis   [] Swelling in legs   [] Varicose veins   [] Non-healing ulcers  Pulmonary:   [] Uses home oxygen   [] Productive cough   [] Hemoptysis   [] Wheeze  [] COPD   [] Asthma Neurologic:  [] Dizziness  [] Blackouts   [] Seizures   [] History of stroke   [] History of TIA  [] Aphasia   [] Temporary blindness   [] Dysphagia   [] Weakness or numbness in arms   [] Weakness or numbness in legs Musculoskeletal:  [] Arthritis   [] Joint swelling   [] Joint pain   [] Low back pain Hematologic:  [] Easy bruising  [] Easy bleeding   [] Hypercoagulable state   [] Anemic  [] Hepatitis Gastrointestinal:  [x] Blood in stool   [] Vomiting blood  [] Gastroesophageal reflux/heartburn   [] Difficulty swallowing. Genitourinary:  [] Chronic kidney disease   [] Difficult urination  [] Frequent urination  [] Burning with urination   [] Blood in urine Skin:  [] Rashes   [] Ulcers   [] Wounds Psychological:  [] History of anxiety   []  History of major depression.  Physical Examination  Vitals:   01/23/22 0723 01/23/22 0900  BP: (!) 142/89 140/74  Pulse: 83 85  Resp: 18 17  Temp: 98.3 F (36.8 C) 98.5 F (36.9 C)  TempSrc: Oral Oral  SpO2: 99% 99%   There is no height or weight on file to calculate BMI. Gen:  WD/WN, NAD Head: [place]/AT, No temporalis wasting. Prominent temp pulse not noted. Ear/Nose/Throat: Hearing grossly intact, nares w/o erythema or drainage, oropharynx w/o Erythema/Exudate Eyes: Sclera non-icteric, conjunctiva clear Neck: Trachea midline.  No JVD.  Pulmonary:  Good air movement, respirations not labored, equal bilaterally.  Cardiac: RRR, normal S1, S2. Vascular: No edema noted Vessel Right Left  Radial Palpable Palpable   Gastrointestinal: soft, non-tender/non-distended. No guarding/reflex.  Bleeding bright red blood from rectum with bowel movements Musculoskeletal: M/S 5/5 throughout.   Extremities without ischemic changes.  No deformity or atrophy. No edema. Neurologic: Sensation grossly intact in extremities.  Symmetrical.  Speech is fluent. Motor exam as listed above. Psychiatric: Judgment intact, Mood & affect appropriate for pt's clinical situation. Dermatologic: No rashes or ulcers noted.  No cellulitis or open wounds. Lymph : No Cervical, Axillary, or Inguinal lymphadenopathy.    CBC Lab Results  Component Value Date   WBC 9.2 01/23/2022   HGB 12.4 01/23/2022   HCT 35.7 (L) 01/23/2022   MCV 77.2 (L) 01/23/2022   PLT 270 01/23/2022    BMET    Component Value Date/Time   NA 140 01/23/2022 0801   K 3.6 01/23/2022 0801   CL 106 01/23/2022 0801   CO2 26 01/23/2022 0801   GLUCOSE 104 (H) 01/23/2022 0801   BUN 9 01/23/2022 0801   CREATININE 0.85 01/23/2022 0801   CALCIUM 9.5 01/23/2022 0801   GFRNONAA >60 01/23/2022 0801   Estimated Creatinine Clearance: 74.6 mL/min (by C-G formula based on SCr of 0.85 mg/dL).  COAG No results found for: "INR", "PROTIME"  Radiology CT ANGIO GI BLEED  Result Date: 01/23/2022 CLINICAL DATA:  Lower GI bleed acute EXAM: CTA ABDOMEN AND PELVIS WITHOUT AND WITH CONTRAST TECHNIQUE: Multidetector CT imaging of the abdomen and pelvis was performed using the standard protocol during bolus administration of intravenous contrast. Multiplanar reconstructed images and MIPs were obtained and reviewed to evaluate the vascular anatomy. RADIATION DOSE REDUCTION: This exam was performed according to the departmental dose-optimization program which includes automated exposure control, adjustment of the mA and/or kV according to patient size and/or use of iterative reconstruction technique. CONTRAST:  OMNIPAQUE IOHEXOL 350 MG/ML SOLN COMPARISON:  None Available. FINDINGS: VASCULAR Contrast extravasation is seen into the lumen of the colon at  the junction of the distal descending and proximal sigmoid colon on arterial phase imaging,  intraluminal contrast increases on venous phase imaging. Normal caliber abdominal aorta with mild atherosclerotic disease. Major branch vessels are patent with no evidence of significant stenosis. NON-VASCULAR Lower chest: Mild ground-glass opacities of the right lower lobe. Hepatobiliary: No suspicious liver lesions. Cholelithiasis with no gallbladder wall thickening. No biliary ductal dilation. Pancreas: Unremarkable. No pancreatic ductal dilatation or surrounding inflammatory changes. Spleen: Normal in size without focal abnormality. Adrenals/Urinary Tract: Adrenal glands are unremarkable. Kidneys are normal, without renal calculi, focal lesion, or hydronephrosis. Diverticulum of the posterior right wall of the bladder. Stomach/Bowel: Stomach is within normal limits. Appendix appears normal. Diverticulosis. No evidence of bowel wall thickening, distention, or inflammatory changes. Lymphatic: No enlarged lymph nodes seen in the abdomen or pelvis. Reproductive: Uterus and bilateral adnexa are unremarkable. Other: No intra-abdominal free air or free fluid. Musculoskeletal: No acute or significant osseous findings. IMPRESSION: VASCULAR 1. Active GI bleed involving the distal descending/proximal sigmoid colon. 2.  Aortic Atherosclerosis (ICD10-I70.0). NON-VASCULAR 1. Diverticulosis with no evidence of diverticulitis. 2. Gallstones. Critical Value/emergent results were called by telephone at the time of interpretation on 01/23/2022 at 9:17 am to provider [facility] , who verbally acknowledged these results. Electronically Signed   By: Allegra Lai M.D.   On: 01/23/2022 09:25      Assessment/Plan 1. GI Bleed Given the patient's active GI bleed involving the distal descending colon and proximal sigmoid colon we will attempt a embolization in order to gain control of the bleeding.  Currently the patient is hemodynamically stable.  We will have this procedure done today.  Discussed with patient and family  she is agreeable to proceed.  2.  Hypertension Currently the patient is normotensive.  Would hold blood pressure medication currently.   Family Communication: Husband is at bedside  Thank you for allowing [place] to participate in the care of this patient.   Georgiana Spinner, NP [facility] [phone] (Office Phone) [phone] (Office Fax) [phone] (Pager)  01/23/2022 11:57 AM  Staff may message me via secure chat in Epic  but this may not receive immediate response,  please page for urgent matters!  Dictation software was used to generate the above note. Typos may occur and escape review, as with typed/written notes. Any error is purely unintentional.  Please contact me directly for clarity if needed.

## 2022-01-24 DIAGNOSIS — K625 Hemorrhage of anus and rectum: Secondary | ICD-10-CM | POA: Diagnosis not present

## 2022-01-24 LAB — COMPREHENSIVE METABOLIC PANEL WITH GFR
ALT: 15 U/L (ref 0–44)
AST: 30 U/L (ref 15–41)
Albumin: 3.7 g/dL (ref 3.5–5.0)
Alkaline Phosphatase: 43 U/L (ref 38–126)
Anion gap: 10 (ref 5–15)
BUN: 8 mg/dL (ref 8–23)
CO2: 24 mmol/L (ref 22–32)
Calcium: 9.2 mg/dL (ref 8.9–10.3)
Chloride: 105 mmol/L (ref 98–111)
Creatinine, Ser: 0.8 mg/dL (ref 0.44–1.00)
GFR, Estimated: >60 mL/min
Glucose, Bld: 115 mg/dL — ABNORMAL HIGH (ref 70–99)
Potassium: 3.4 mmol/L — ABNORMAL LOW (ref 3.5–5.1)
Sodium: 139 mmol/L (ref 135–145)
Total Bilirubin: 0.8 mg/dL (ref 0.3–1.2)
Total Protein: 7.7 g/dL (ref 6.5–8.1)

## 2022-01-24 LAB — CBC
HCT: 31.4 % — ABNORMAL LOW (ref 36.0–46.0)
Hemoglobin: 11 g/dL — ABNORMAL LOW (ref 12.0–15.0)
MCH: 27.3 pg (ref 26.0–34.0)
MCHC: 35 g/dL (ref 30.0–36.0)
MCV: 77.9 fL — ABNORMAL LOW (ref 80.0–100.0)
Platelets: 194 K/uL (ref 150–400)
RBC: 4.03 MIL/uL (ref 3.87–5.11)
RDW: 15.5 % (ref 11.5–15.5)
WBC: 16.7 K/uL — ABNORMAL HIGH (ref 4.0–10.5)
nRBC: 0 % (ref 0.0–0.2)

## 2022-01-24 LAB — HEMOGLOBIN AND HEMATOCRIT, BLOOD
HCT: 35 % — ABNORMAL LOW (ref 36.0–46.0)
Hemoglobin: 12.1 g/dL (ref 12.0–15.0)

## 2022-01-24 LAB — CBC WITH DIFFERENTIAL/PLATELET
Abs Immature Granulocytes: 0.1 K/uL — ABNORMAL HIGH (ref 0.00–0.07)
Basophils Absolute: 0 K/uL (ref 0.0–0.1)
Basophils Relative: 0 %
Eosinophils Absolute: 0 K/uL (ref 0.0–0.5)
Eosinophils Relative: 0 %
HCT: 33.2 % — ABNORMAL LOW (ref 36.0–46.0)
Hemoglobin: 11.7 g/dL — ABNORMAL LOW (ref 12.0–15.0)
Immature Granulocytes: 1 %
Lymphocytes Relative: 25 %
Lymphs Abs: 4.4 K/uL — ABNORMAL HIGH (ref 0.7–4.0)
MCH: 27.5 pg (ref 26.0–34.0)
MCHC: 35.2 g/dL (ref 30.0–36.0)
MCV: 78.1 fL — ABNORMAL LOW (ref 80.0–100.0)
Monocytes Absolute: 1.3 K/uL — ABNORMAL HIGH (ref 0.1–1.0)
Monocytes Relative: 7 %
Neutro Abs: 11.6 K/uL — ABNORMAL HIGH (ref 1.7–7.7)
Neutrophils Relative %: 67 %
Platelets: 206 K/uL (ref 150–400)
RBC: 4.25 MIL/uL (ref 3.87–5.11)
RDW: 15.5 % (ref 11.5–15.5)
WBC: 17.4 K/uL — ABNORMAL HIGH (ref 4.0–10.5)
nRBC: 0 % (ref 0.0–0.2)

## 2022-01-24 LAB — PHOSPHORUS: Phosphorus: 3.3 mg/dL (ref 2.5–4.6)

## 2022-01-24 LAB — MAGNESIUM: Magnesium: 1.9 mg/dL (ref 1.7–2.4)

## 2022-01-24 MED ORDER — CITALOPRAM HYDROBROMIDE 20 MG PO TABS
10.0000 mg | ORAL_TABLET | Freq: Every day | ORAL | Status: DC
  Administered 2022-01-24: 10 mg via ORAL
  Filled 2022-01-24: qty 1

## 2022-01-24 MED ORDER — DILTIAZEM HCL ER COATED BEADS 240 MG PO CP24
240.0000 mg | ORAL_CAPSULE | Freq: Every day | ORAL | Status: DC
  Administered 2022-01-24: 240 mg via ORAL
  Filled 2022-01-24: qty 1

## 2022-01-24 MED ORDER — MONTELUKAST SODIUM 10 MG PO TABS: 10.0000 mg | ORAL_TABLET | Freq: Every day | ORAL | Status: DC

## 2022-01-24 MED ORDER — DICYCLOMINE HCL 10 MG PO CAPS
10.0000 mg | ORAL_CAPSULE | Freq: Three times a day (TID) | ORAL | Status: DC | PRN
  Administered 2022-01-24 (×2): 10 mg via ORAL
  Filled 2022-01-24 (×3): qty 1

## 2022-01-24 MED ORDER — DILTIAZEM HCL ER COATED BEADS 120 MG PO CP24: 120.0000 mg | ORAL_CAPSULE | Freq: Every day | ORAL | Status: DC

## 2022-01-24 NOTE — Progress Notes (Addendum)
See discharge summary

## 2022-01-24 NOTE — Progress Notes (Signed)
Postprocedural day #1 for embolization for lower GI bleed.  Hemodynamics remained stable, slightly tachycardic and hypertensive off of her calcium channel blocker.  No further episodes of blood per rectum.  Does have some cramping.  Has not required significant pain medicine, rather medication for cramping.  No insertion site complication.  Hemoglobin stable.  Would favor mobilization, advancing the diet, resuming home meds.  After period of observation may be a candidate for discharge today.

## 2022-01-24 NOTE — Discharge Summary (Signed)
Physician Discharge Summary   Patient: Cheryl Coleman MRN: [id number]  DOB: Jan 12, 1956   Admit:     Date of Admission: 01/23/2022 Admitted from: home   Discharge: Date of discharge: 01/24/22 Disposition: Home Condition at discharge: good  CODE STATUS: FULL     Discharge Physician: Sunnie Nielsen, DO [facility]     PCP: [name], [name], MD  Recommendations for Outpatient Follow-up:  Follow up with PCP [name], [name], MD in 1 weeks Please obtain labs/tests: CBC, BMP in 1 week Please follow up on the following pending results: none   Discharge Instructions     Diet - low sodium heart healthy   Complete by: As directed    Discharge instructions   Complete by: As directed    You have been treated in the hospital for acute blood loss anemia/hemorrhagic shock due to blood loss associated with rectal bleed which was most likely due to diverticular disease.  You underwent an embolization procedure to stop this bleeding and it appears to have been successful.  You were given 2 units of blood.  You can likely expect to see passage of blood clots in the stool or some dark blood, if you experience profuse red blood per rectum, or other concerns especially if accompanied by dizziness/weakness/lightheadedness, please seek emergency care ASAP.    Your white blood cell count was elevated likely due to inflammatory reaction from the stress of the shock.  Your hemoglobin has been stable, there was a very slight decrease but this is not concerning given that you have not had any signs or symptoms of additional bleeding and your blood pressure has remained normal.    You have been offered another night in the hospital to continue to monitor versus home to self-care. Your attending physician, with input from the vascular surgery, team felt comfortable sending you home but of course if you experience any concerning symptoms please seek emergency medical attention ASAP.     You have been advised to call your PCP first thing on Monday and let them know you were in the hospital, it is recommended that you have repeat blood check sometime in the next few days, no later than 1 week.   Increase activity slowly   Complete by: As directed       Hospital Course:  Cheryl Coleman is a 66 y.o. female with medical history significant for GERD, hiatal hernia, diverticular disease, hypertension who presents to the emergency room 01/23/2022 for evaluation of rectal bleeding which started 1 day prior to her admission.  No abdominal pain.  Was getting ready to go to work, wiped herself, saw BRB.  Came to ER. 07/07: In ED, large amount BRBPR, no dizziness/lightheadedness.  Initial hemoglobin 13.3.  Given known history of diverticulosis and imaging showing active GI bleed involving distal descending/proximal sigmoid colon, was admitted and vascular surgery was consulted.  IV fluid resuscitation and monitor H&H.  A, rapid response was called in the special procedures area.  Became unresponsive, hypotensive, treated for acute blood loss anemia and hemorrhagic shock with IV fluids administered wide open, central line placed, received 2 units PRBC.  Intensivist was consulted.  Underwent embolization procedure with vascular surgery. 07/08: Hypertensive and tachycardic, resumed home Cardizem.  Vascular surgery rounded this morning, hemodynamics stable, no further episodes BRBPR, noted some abdominal cramping.  Recommended mobilizing, advancing diet, resuming home meds and may be candidate dor discharge today. Diet was advanced.  Hemoglobin has remained stable, slight drop to 11  but no overt bleeding or pain. Offered patient option to remain tonight to recheck in AM and continue to monitor, vs d/c home w/ precautions to return if bleeding recurs. Pt opts for discharge and this is reasonable to me.     Consultants:  Vascular surgery ICU  Procedures: Vascular surgery 01/23/2022: Embolization colic  and associated sigmoid arteries    Subjective: Patient reports still having some abdominal discomfort and passing small clots per rectum this morning. THis afternoon tolerating diet, no abdominal pain, no additional passage of clots/blood but no BM yet.        Discharge Diagnoses: Principal Problem:   Rectal bleeding Active Problems:   Essential hypertension   Mild intermittent asthma   History of GI diverticular bleed    Assessment & Plan:  Rectal bleeding likely d/t diverticular disease  Treated for active GI bleed,  acute blood loss anemia resulting in hemorrhagic shock, requiring 2 units packed red blood cells, underwent embolization 01/23/2022 with vascular surgery, H/H has remained stable Advance diet as tolerated Resume home medications Safe for discharge w/ close outpateitn follow up and return precautions, see above  Mild intermittent asthma Stable and not acutely exacerbated Continue as needed bronchodilator therapy  Essential hypertension Initially was normotensive and concern for hypovolemic/blood loss causing shock Home olmesartan and diltiazem initially held Hypertensive and tachycardic this morning, diltiazem restarted      Discharge Instructions  Allergies as of 01/24/2022       Reactions   Oxycodone Other (See Comments)   Feels bad        Medication List     TAKE these medications    albuterol 108 (90 Base) MCG/ACT inhaler Commonly known as: VENTOLIN HFA INHALE 1-2 PUFFS BY MOUTH EVERY 6 (SIX) HOURS AS NEEDED   ALPRAZolam 0.25 MG tablet Commonly known as: XANAX Take 0.5-1 tablets (0.125-0.25 mg total) by mouth daily as needed for anxiety.   atorvastatin 10 MG tablet Commonly known as: LIPITOR TAKE 1 TABLET (10 MG TOTAL) BY MOUTH DAILY AT 6 PM.   CALCIUM PO Take by mouth daily.   citalopram 10 MG tablet Commonly known as: CeleXA Take 1 tablet (10 mg total) by mouth daily.   CLARITIN PO Take by mouth as needed.   diltiazem  240 MG 24 hr capsule Commonly known as: CARDIZEM CD Take 1 capsule (240 mg total) by mouth daily. In am   diltiazem 120 MG 24 hr capsule Commonly known as: Cardizem CD Take 1 capsule (120 mg total) by mouth daily. qhs   fluticasone 50 MCG/ACT nasal spray Commonly known as: FLONASE SPRAY 2 SPRAYS INTO EACH NOSTRIL EVERY DAY   montelukast 10 MG tablet Commonly known as: SINGULAIR TAKE 1 TABLET BY MOUTH EVERYDAY AT BEDTIME   nystatin 100000 UNIT/ML suspension Commonly known as: MYCOSTATIN Take 5 mLs (500,000 Units total) by mouth 4 (four) times daily. Swish, gargle and swallow   olmesartan 5 MG tablet Commonly known as: BENICAR Take 2 tablets (10 mg total) by mouth daily.   omeprazole 40 MG capsule Commonly known as: PRILOSEC Take 1 capsule (40 mg total) by mouth daily. Qhs 30 min before food   Pataday 0.7 % Soln Generic drug: Olopatadine HCl Apply 1 drop to eye daily as needed.   sodium chloride 0.65 % Soln nasal spray Commonly known as: OCEAN Place 2 sprays into both nostrils as needed for congestion. Before flonase   TYLENOL PO Take by mouth.   VITAMIN C PO Take by mouth daily.  VITAMIN D PO Take by mouth daily.   WOMENS VITA PAK PO Take by mouth daily.   ZINC PO Take by mouth daily.          Allergies  Allergen Reactions   Oxycodone Other (See Comments)    Feels bad       Discharge Exam: Vitals:   01/24/22 1400 01/24/22 1500  BP: (!) 125/59 139/74  Pulse: 97 97  Resp: 14   Temp:    SpO2: 95% 98%   Vitals:   01/24/22 1200 01/24/22 1300 01/24/22 1400 01/24/22 1500  BP: (!) 155/84 (!) 154/69 (!) 125/59 139/74  Pulse: 99 (!) 116 97 97  Resp: 16  14   Temp:      TempSrc:      SpO2: 97% 100% 95% 98%  Weight:      Height:         General: Pt is alert, awake, not in acute distress Cardiovascular: RRR, S1/S2 +, no rubs, no gallops Respiratory: CTA bilaterally, no wheezing, no rhonchi Abdominal: Soft, NT, ND, bowel sounds  + Extremities: no edema, no cyanosis     The results of significant diagnostics from this hospitalization (including imaging, microbiology, ancillary and laboratory) are listed below for reference.     Microbiology: No results found for this or any previous visit (from the past 240 hour(s)).   Labs: BNP (last 3 results) No results for input(s): "BNP" in the last 8760 hours. Basic Metabolic Panel: Recent Labs  Lab 01/23/22 0801 01/24/22 1029  NA 140 139  K 3.6 3.4*  CL 106 105  CO2 26 24  GLUCOSE 104* 115*  BUN 9 8  CREATININE 0.85 0.80  CALCIUM 9.5 9.2  MG  --  1.9  PHOS  --  3.3   Liver Function Tests: Recent Labs  Lab 01/23/22 0801 01/24/22 1029  AST 22 30  ALT 17 15  ALKPHOS 63 43  BILITOT 0.7 0.8  PROT 8.6* 7.7  ALBUMIN 4.4 3.7   No results for input(s): "LIPASE", "AMYLASE" in the last 168 hours. No results for input(s): "AMMONIA" in the last 168 hours. CBC: Recent Labs  Lab 01/23/22 0801 01/23/22 0818 01/23/22 1633 01/23/22 2211 01/24/22 1029 01/24/22 1537  WBC 9.2  --   --   --  17.4* 16.7*  NEUTROABS  --   --   --   --  11.6*  --   HGB 13.3 12.4 12.2 12.1 11.7* 11.0*  HCT 38.2 35.7* 35.5* 35.0* 33.2* 31.4*  MCV 77.2*  --   --   --  78.1* 77.9*  PLT 270  --   --   --  206 194   Cardiac Enzymes: No results for input(s): "CKTOTAL", "CKMB", "CKMBINDEX", "TROPONINI" in the last 168 hours. BNP: Invalid input(s): "POCBNP" CBG: Recent Labs  Lab 01/23/22 1530  GLUCAP 114*   D-Dimer No results for input(s): "DDIMER" in the last 72 hours. Hgb A1c No results for input(s): "HGBA1C" in the last 72 hours. Lipid Profile No results for input(s): "CHOL", "HDL", "LDLCALC", "TRIG", "CHOLHDL", "LDLDIRECT" in the last 72 hours. Thyroid function studies No results for input(s): "TSH", "T4TOTAL", "T3FREE", "THYROIDAB" in the last 72 hours.  Invalid input(s): "FREET3" Anemia work up Recent Labs    01/23/22 1633  TIBC 311  IRON 69   Urinalysis     Component Value Date/Time   COLORURINE YELLOW 07/08/2021 1135   APPEARANCEUR CLEAR 07/08/2021 1135   APPEARANCEUR Clear 08/29/2018 0848   LABSPEC 1.012 07/08/2021  1135   PHURINE 5.5 07/08/2021 1135   GLUCOSEU NEGATIVE 07/08/2021 1135   HGBUR NEGATIVE 07/08/2021 [place] 07/04/2020 1744   BILIRUBINUR Negative 08/29/2018 0848   KETONESUR NEGATIVE 07/08/2021 1135   PROTEINUR TRACE (A) 07/08/2021 1135   NITRITE NEGATIVE 07/08/2021 1135   LEUKOCYTESUR NEGATIVE 07/08/2021 1135   Sepsis Labs Recent Labs  Lab 01/23/22 0801 01/24/22 1029 01/24/22 1537  WBC 9.2 17.4* 16.7*   Microbiology No results found for this or any previous visit (from the past 240 hour(s)). Imaging PERIPHERAL VASCULAR CATHETERIZATION  Result Date: 01/23/2022 See surgical note for result.  DG Chest Port 1 View  Result Date: 01/23/2022 CLINICAL DATA:  66 year old female status post central line placement. EXAM: PORTABLE CHEST - 1 VIEW COMPARISON:  10/06/2018 FINDINGS: The mediastinal contours are within normal limits. No cardiomegaly. Interval insertion of right internal jugular vein central venous catheter with the catheter tip in the right atrium. Mild cephalization of the pulmonary vasculature. No focal consolidations or evidence of significant pleural effusion or pneumothorax. No acute osseous abnormality. IMPRESSION: 1. No evidence of pneumothorax after insertion of right internal jugular central venous catheter with the tip in the right atrium. 2. Pulmonary vascular congestion without evidence of edema. Ruthann Cancer, MD [facility] Electronically Signed   By: Ruthann Cancer M.D.   On: 01/23/2022 12:31   CT ANGIO GI BLEED  Result Date: 01/23/2022 CLINICAL DATA:  Lower GI bleed acute EXAM: CTA ABDOMEN AND PELVIS WITHOUT AND WITH CONTRAST TECHNIQUE: Multidetector CT imaging of the abdomen and pelvis was performed using the standard protocol  during bolus administration of intravenous contrast. Multiplanar reconstructed images and MIPs were obtained and reviewed to evaluate the vascular anatomy. RADIATION DOSE REDUCTION: This exam was performed according to the departmental dose-optimization program which includes automated exposure control, adjustment of the mA and/or kV according to patient size and/or use of iterative reconstruction technique. CONTRAST:  184mL OMNIPAQUE IOHEXOL 350 MG/ML SOLN COMPARISON:  None Available. FINDINGS: VASCULAR Contrast extravasation is seen into the lumen of the colon at the junction of the distal descending and proximal sigmoid colon on arterial phase imaging, intraluminal contrast increases on venous phase imaging. Normal caliber abdominal aorta with mild atherosclerotic disease. Major branch vessels are patent with no evidence of significant stenosis. NON-VASCULAR Lower chest: Mild ground-glass opacities of the right lower lobe. Hepatobiliary: No suspicious liver lesions. Cholelithiasis with no gallbladder wall thickening. No biliary ductal dilation. Pancreas: Unremarkable. No pancreatic ductal dilatation or surrounding inflammatory changes. Spleen: Normal in size without focal abnormality. Adrenals/Urinary Tract: Adrenal glands are unremarkable. Kidneys are normal, without renal calculi, focal lesion, or hydronephrosis. Diverticulum of the posterior right wall of the bladder. Stomach/Bowel: Stomach is within normal limits. Appendix appears normal. Diverticulosis. No evidence of bowel wall thickening, distention, or inflammatory changes. Lymphatic: No enlarged lymph nodes seen in the abdomen or pelvis. Reproductive: Uterus and bilateral adnexa are unremarkable. Other: No intra-abdominal free air or free fluid. Musculoskeletal: No acute or significant osseous findings. IMPRESSION: VASCULAR 1. Active GI bleed involving the distal descending/proximal sigmoid colon. 2.  Aortic Atherosclerosis (ICD10-I70.0). NON-VASCULAR 1.  Diverticulosis with no evidence of diverticulitis. 2. Gallstones. Critical Value/emergent results were called by telephone at the time of interpretation on 01/23/2022 at 9:17 am to provider [facility] , who verbally acknowledged these results. Electronically Signed   By: Yetta Glassman M.D.   On: 01/23/2022 09:25      Time coordinating discharge: Over 30 minutes  SIGNED:  Sunnie Nielsen DO [facility]

## 2022-01-24 NOTE — Progress Notes (Signed)
Patient discharged. All belongings with patient and husband. 

## 2022-01-24 NOTE — Progress Notes (Signed)
Still having abd cramping, provided zofran and notified MD of continued cramping received order for Bentyl.

## 2022-01-25 LAB — TYPE AND SCREEN
ABO/RH(D): O POS
Antibody Screen: NEGATIVE
Unit division: 0
Unit division: 0
Unit division: 0
Unit division: 0

## 2022-01-25 LAB — BPAM RBC
Blood Product Expiration Date: 202308032359
Blood Product Expiration Date: 202308052359
Blood Product Expiration Date: 202308092359
Blood Product Expiration Date: 202308092359
ISSUE DATE / TIME: 202307071216
ISSUE DATE / TIME: 202307071415
Unit Type and Rh: 5100
Unit Type and Rh: 5100
Unit Type and Rh: 5100
Unit Type and Rh: 5100

## 2022-01-25 LAB — PREPARE RBC (CROSSMATCH)

## 2022-01-26 ENCOUNTER — Encounter: Payer: Self-pay | Admitting: Vascular Surgery

## 2022-01-26 ENCOUNTER — Telehealth: Payer: Self-pay

## 2022-01-26 NOTE — Telephone Encounter (Signed)
Transition Care Management Follow-up Telephone Call Date of discharge and from where: 01/24/22 [facility] How have you been since you were released from the hospital? Taking Miralax as directed. First BM today since discharge. Small in amount, pasty and dark to normal in color as expected.  Denies abdominal pain. Minimal cramping today when BM ready to produce. Denies bleeding, dizziness/weakness/lightheadedness. Taking it easy. Surgical site clean/dry, a little discomfort and itchy. Eating healthy, staying hydrated and adding fiber.  Any questions or concerns? No  Items Reviewed: Did the pt receive and understand the discharge instructions provided? Yes  Medications obtained and verified? Yes  Any new allergies since your discharge? No  Dietary orders reviewed? Yes Do you have support at home? Yes   Home Care and Equipment/Supplies: Were home health services ordered? No  Functional Questionnaire: (I = Independent and D = Dependent) ADLs: I  Bathing/Dressing- I  Meal Prep- husband assist  Eating- I  Maintaining continence- I  Transferring/Ambulation- I  Managing Meds- I  Follow up appointments reviewed:  PCP Hospital f/u appt confirmed? Yes  Scheduled to see PCP on 01/29/22 @ 10:40. Specialist Hospital f/u appt confirmed? N/a Are transportation arrangements needed? No  If their condition worsens, is the pt aware to call PCP or go to the Emergency Dept.? Yes Was the patient provided with contact information for the PCP's office or ED? Yes Was to pt encouraged to call back with questions or concerns? Yes

## 2022-01-28 LAB — PREPARE RBC (CROSSMATCH)

## 2022-01-29 ENCOUNTER — Ambulatory Visit (INDEPENDENT_AMBULATORY_CARE_PROVIDER_SITE_OTHER): Payer: Medicare HMO | Admitting: Internal Medicine

## 2022-01-29 ENCOUNTER — Encounter: Payer: Self-pay | Admitting: Internal Medicine

## 2022-01-29 VITALS — BP 110/60 | HR 80 | Temp 98.0°F | Resp 14 | Ht 64.0 in | Wt 222.0 lb

## 2022-01-29 DIAGNOSIS — E876 Hypokalemia: Secondary | ICD-10-CM | POA: Diagnosis not present

## 2022-01-29 DIAGNOSIS — K5791 Diverticulosis of intestine, part unspecified, without perforation or abscess with bleeding: Secondary | ICD-10-CM | POA: Diagnosis not present

## 2022-01-29 DIAGNOSIS — K922 Gastrointestinal hemorrhage, unspecified: Secondary | ICD-10-CM | POA: Insufficient documentation

## 2022-01-29 DIAGNOSIS — I959 Hypotension, unspecified: Secondary | ICD-10-CM

## 2022-01-29 LAB — CBC WITH DIFFERENTIAL/PLATELET
Basophils Absolute: 0.1 K/uL (ref 0.0–0.1)
Basophils Relative: 0.5 % (ref 0.0–3.0)
Eosinophils Absolute: 0.4 K/uL (ref 0.0–0.7)
Eosinophils Relative: 3.4 % (ref 0.0–5.0)
HCT: 35.8 % — ABNORMAL LOW (ref 36.0–46.0)
Hemoglobin: 12.1 g/dL (ref 12.0–15.0)
Lymphocytes Relative: 35.1 % (ref 12.0–46.0)
Lymphs Abs: 3.8 K/uL (ref 0.7–4.0)
MCHC: 33.9 g/dL (ref 30.0–36.0)
MCV: 82 fl (ref 78.0–100.0)
Monocytes Absolute: 0.9 K/uL (ref 0.1–1.0)
Monocytes Relative: 8.7 % (ref 3.0–12.0)
Neutro Abs: 5.6 K/uL (ref 1.4–7.7)
Neutrophils Relative %: 52.3 % (ref 43.0–77.0)
Platelets: 243 K/uL (ref 150.0–400.0)
RBC: 4.37 Mil/uL (ref 3.87–5.11)
RDW: 15.7 % — ABNORMAL HIGH (ref 11.5–15.5)
WBC: 10.8 K/uL — ABNORMAL HIGH (ref 4.0–10.5)

## 2022-01-29 LAB — IBC + FERRITIN
Ferritin: 39.4 ng/mL (ref 10.0–291.0)
Iron: 52 ug/dL (ref 42–145)
Saturation Ratios: 13.2 % — ABNORMAL LOW (ref 20.0–50.0)
TIBC: 394.8 ug/dL (ref 250.0–450.0)
Transferrin: 282 mg/dL (ref 212.0–360.0)

## 2022-01-29 LAB — BASIC METABOLIC PANEL WITH GFR
BUN: 9 mg/dL (ref 6–23)
CO2: 27 meq/L (ref 19–32)
Calcium: 9.4 mg/dL (ref 8.4–10.5)
Chloride: 102 meq/L (ref 96–112)
Creatinine, Ser: 0.94 mg/dL (ref 0.40–1.20)
GFR: 62.79 mL/min
Glucose, Bld: 92 mg/dL (ref 70–99)
Potassium: 3.8 meq/L (ref 3.5–5.1)
Sodium: 136 meq/L (ref 135–145)

## 2022-01-29 NOTE — Patient Instructions (Addendum)
Iron Deficiency Anemia, Adult Iron deficiency anemia is when you do not have enough red blood cells or hemoglobin in your blood. This happens because you have too little iron in your body. Hemoglobin carries oxygen to parts of the body. Anemia can cause your body to not get enough oxygen. What are the causes? Not eating enough foods that have iron in them. The body not being able to take in iron well. Needing more iron due to pregnancy or heavy menstrual periods, for females. Cancer. Bleeding in the bowels. Many blood draws. What increases the risk? Being pregnant. Being a teenage girl going through a growth spurt. What are the signs or symptoms? Pale skin, lips, and nails. Weakness, dizziness, and getting tired easily. Headache. Feeling like you cannot breathe well when moving (shortness of breath). Cold hands and feet. Fast heartbeat or a heartbeat that is not regular. Feeling grouchy (irritable) or breathing fast. These are more common in very bad anemia. Mild anemia may not cause any symptoms. How is this treated? This condition is treated by finding out why you do not have enough iron and then getting more iron. It may include: Adding foods to your diet that have a lot of iron. Taking iron pills (supplements). If you are pregnant or breastfeeding, you may need to take extra iron. Your diet often does not provide the amount of iron that you need. Getting more vitamin C in your diet. Vitamin C helps your body take in iron. You may need to take iron pills with a glass of orange juice or vitamin C pills. Medicines to make heavy menstrual periods lighter. Surgery. You may need blood tests to see if treatment is working. If the treatment does not seem to be working, you may need more tests. Follow these instructions at home: Medicines Take over-the-counter and prescription medicines only as told by your doctor. This includes iron pills and vitamins. Take iron pills when your stomach is  empty. If you cannot handle this, take them with food. Do not drink milk or take antacids at the same time as your iron pills. Iron pills may turn your poop (stool)black. If you cannot handle taking iron pills by mouth, ask your doctor about getting iron through: An IV tube. A shot (injection) into a muscle. Eating and drinking  Talk with your doctor before changing the foods you eat. He or she may tell you to eat foods that have a lot of iron, such as: Liver. Low-fat (lean) beef. Breads and cereals that have iron added to them. Eggs. Dried fruit. Dark green, leafy vegetables. Eat fresh fruits and vegetables that are high in vitamin C. They help your body use iron. Foods with a lot of vitamin C include: Oranges. Peppers. Tomatoes. Mangoes. Drink enough fluid to keep your pee (urine) pale yellow. Managing constipation If you are taking iron pills, they may cause trouble pooping (constipation). To prevent or treat trouble pooping, you may need to: Take over-the-counter or prescription medicines. Eat foods that are high in fiber. These include beans, whole grains, and fresh fruits and vegetables. Limit foods that are high in fat and sugar. These include fried or sweet foods. General instructions Return to your normal activities as told by your doctor. Ask your doctor what activities are safe for you. Keep yourself clean, and keep things clean around you. Keep all follow-up visits as told by your doctor. This is important. Contact a doctor if: You feel like you may vomit (nauseous), or you vomit. You  feel weak. You are sweating for no reason. You have trouble pooping, such as: Pooping less than 3 times a week. Straining to poop. Having poop that is hard, dry, or larger than normal. Feeling full or bloated. Pain in the lower belly. Not feeling better after pooping. Get help right away if: You pass out (faint). You have chest pain. You have trouble breathing that: Is very  bad. Gets worse with physical activity. You have a fast heartbeat, or a heartbeat that does not feel regular. You get light-headed when getting up from sitting or lying down. These symptoms may be an emergency. Do not wait to see if the symptoms will go away. Get medical help right away. Call your local emergency services (911 in the [place] Do not drive yourself to the hospital. Summary Iron deficiency anemia is when you have too little iron in your body. This condition is treated by finding out why you do not have enough iron in your body and then getting more iron. Take over-the-counter and prescription medicines only as told by your doctor. Eat fresh fruits and vegetables that are high in vitamin C. Get help right away if you cannot breathe well. This information is not intended to replace advice given to you by your health care provider. Make sure you discuss any questions you have with your health care provider. Document Revised: 03/14/2019 Document Reviewed: 03/14/2019 [employer] Patient Education  2022 [employer]  Gastrointestinal Bleeding Gastrointestinal (GI) bleeding is bleeding somewhere along the digestive tract, between the mouth and the anus. The digestive tract includes the mouth, esophagus, stomach, small intestine, large intestine, and anus. The large intestine is often called the colon. GI bleeding can be caused by various problems. The severity of these problems can be mild, serious, or life-threatening. If you have GI bleeding, you may find blood in your stools (feces), you may have black stools, or you may vomit blood. You may need to stay in the hospital if there is a lot of bleeding. What are the causes? This condition may be caused by: Inflammation, irritation, or swelling of the esophagus (esophagitis). The esophagus is part of the body that moves food from your mouth to your stomach. Swollen veins in the rectum (hemorrhoids). Tears in the anus (anal fissures). The  tears are often caused by passing hard stool. Pouches that form on the colon and may bleed (diverticulosis). Inflammation in areas with diverticulosis. This is called diverticulitis.This can cause pain, fever, and bloody stools. Growths (polyps) or cancer. Colon cancer often starts out as precancerous polyps. Gastritis and ulcers. These may cause bleeding in the upper GI tract, near the stomach. What increases the risk? You are more likely to develop this condition if: You have an infection in your stomach from a type of bacteria called Helicobacter pylori. You take certain medicines, such as: NSAIDs. Aspirin. Selective serotonin reuptake inhibitors (SSRIs). Steroids. Antiplatelet or anticoagulant medicines. You smoke. You drink alcohol. What are the signs or symptoms? Common symptoms of this condition include: Bright red blood in your vomit, or vomit that looks like coffee grounds. Bloody, black, or tarry stools. Bleeding from the lower GI tract will usually cause red or maroon blood in the stools. Bleeding from the upper GI tract may cause black, tarry stools that are often stronger smelling than usual. In certain cases, if the bleeding is fast enough, the stools may be red. Pain or cramping in the abdomen. How is this diagnosed? This condition may be diagnosed based on: Your  medical history and a physical exam. Various tests, such as: Blood tests. Stool tests. X-rays and other imaging tests. Esophagogastroduodenoscopy (EGD). In this test, a flexible, lighted tube is used to look at your esophagus, stomach, and small intestine. Colonoscopy. In this test, a flexible, lighted tube is used to look at your colon. How is this treated? Treatment for this condition depends on the cause of the bleeding. For example: For bleeding from the esophagus, stomach, small intestine, or colon, the health care provider may do a procedure to stop bleeding during your EGD or  colonoscopy. Inflammation or infection of the colon can be treated with medicines. Certain rectal problems can be treated with creams, suppositories, or warm baths. Medicines may be given to reduce acid in your stomach. Surgery is sometimes done. Blood transfusions are sometimes needed if a lot of blood has been lost. If there is a lot of bleeding, you will need to stay in the hospital for observation. If bleeding is mild, you may be allowed to go home. Follow these instructions at home:  Take over-the-counter and prescription medicines only as told by your health care provider. Eat foods that are high in fiber, such as beans, whole grains, and fresh fruits and vegetables. This will help to keep your stools soft. Eating 1-3 prunes each day works well for many people. Drink enough fluid to keep your urine pale yellow. Keep all follow-up visits. This is important. Contact a health care provider if: Your symptoms do not improve with treatment. Get help right away if: Your bleeding does not stop. You feel light-headed or you faint. You feel weak. You have severe cramps in your back or abdomen. You pass large blood clots in your stool. Your symptoms are getting worse. You have chest pain or fast heartbeats. These symptoms may be an emergency. Get help right away. Call 911. Do not wait to see if the symptoms will go away. Do not drive yourself to the hospital. Summary Gastrointestinal (GI) bleeding is bleeding somewhere along the digestive tract, between the mouth and anus. GI bleeding can be caused by various problems. Treatment for this condition depends on the cause of the bleeding. Take over-the-counter and prescription medicines only as told by your health care provider. Get help right away if your bleeding increases, your symptoms are getting worse, or you have new symptoms. Keep all follow-up visits. This is important. This information is not intended to replace advice given to you  by your health care provider. Make sure you discuss any questions you have with your health care provider. Document Revised: 02/07/2021 Document Reviewed: 02/07/2021 [employer] Patient Education  2023 [employer].

## 2022-01-29 NOTE — Progress Notes (Signed)
Chief Complaint  Patient presents with   Hospitalization Follow-up    [facility] 7/7-7/8 for GI bleed, CTA:+ Distal descending prox sigmoid bleed, labs done at [facility]   HFU [facility] 7/7-01/24/22 [facility] GIB. This happened 15 years ago as well with more significant bleeding in [place] She bleed about 4 times in the ED she had hypotensive shot and is s/p 01/23/22  Vascular surgery Dr. 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  HENT:  Negative for hearing loss.   Eyes:  Negative for blurred vision.  Respiratory:  Negative for shortness of breath.   Cardiovascular:  Negative for chest pain.  Gastrointestinal:  Negative for abdominal pain.  Genitourinary:  Negative for dysuria.  Musculoskeletal:  Negative for falls and joint pain.  Skin:  Negative for rash.  Neurological:  Negative for headaches.  Psychiatric/Behavioral:  Negative for depression.    Past Medical History:  Diagnosis Date   Arthritis    Asthma    COVID    COVID-19    07/24/20, 05/19/21   Diverticulitis    GERD (gastroesophageal reflux disease)    Hiatal hernia    History of blood transfusion    History of chicken pox    History of GI bleed    2013/14   Hyperlipidemia    Hypertension    Leukocytosis    Type O blood, Rh positive    Past Surgical History:  Procedure Laterality Date   BREAST CYST ASPIRATION Right    CESAREAN SECTION     04/27/84   COLONOSCOPY     EMBOLIZATION N/A 01/23/2022   Procedure: EMBOLIZATION;  Surgeon: [name], MD;  Location: [facility] INVASIVE CV LAB;  Service: Cardiovascular;  Laterality: N/A;   ESOPHAGOGASTRODUODENOSCOPY (EGD) WITH PROPOFOL N/A 08/29/2020    Procedure: ESOPHAGOGASTRODUODENOSCOPY (EGD) WITH PROPOFOL;  Surgeon: [name], MD;  Location: [facility] ENDOSCOPY;  Service: Gastroenterology;  Laterality: N/A;   ORIF ACETABULAR FRACTURE     Family History  Problem Relation Age of Onset   Hypertension Maternal Aunt    Diabetes Maternal Aunt    Breast cancer Neg Hx    Social History   Socioeconomic History   Marital status: Married    Spouse name: Not on file   Number of children: Not on file   Years of education: Not on file   Highest education level: Not on file  Occupational History   Not on file  Tobacco Use   Smoking status: Never   Smokeless tobacco: Never  Vaping Use   Vaping Use: Never used  Substance and Sexual Activity   Alcohol use: Not Currently   Drug use: Not Currently   Sexual activity: Yes    Comment: husband   Other Topics Concern   Not on file  Social History Narrative   Lived in [place] from [place]    Married 1st husband died in late 90s/2000s   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   albuterol (VENTOLIN HFA) 108 (90 Base) MCG/ACT inhaler INHALE 1-2 PUFFS BY MOUTH EVERY 6 (SIX) HOURS AS NEEDED   Ascorbic Acid (VITAMIN C PO) Take by mouth daily.   atorvastatin (LIPITOR) 10 MG tablet TAKE 1 TABLET (10 MG TOTAL) BY MOUTH DAILY AT 6 PM.   CALCIUM PO Take by mouth daily.   citalopram (CELEXA) 10 MG tablet Take 1 tablet (10 mg total) by mouth daily.   diltiazem (CARDIZEM CD) 120 MG 24 hr capsule Take 1 capsule (120 mg total) by mouth daily. qhs   diltiazem (CARDIZEM CD) 240 MG 24 hr capsule Take 1 capsule (240 mg total) by mouth  daily. In am   Loratadine (CLARITIN PO) Take by mouth as needed.   montelukast (SINGULAIR) 10 MG tablet TAKE 1 TABLET BY MOUTH EVERYDAY AT BEDTIME   Multiple Vitamins-Minerals (ZINC PO) Take by mouth daily.   olmesartan (BENICAR) 5 MG tablet Take 2 tablets (10 mg total) by mouth daily.   Olopatadine HCl (PATADAY) 0.7 % SOLN Apply 1 drop to eye daily as needed.   omeprazole (PRILOSEC) 40 MG capsule Take 1 capsule (40 mg total) by mouth daily. Qhs 30 min before food   sodium chloride (OCEAN) 0.65 % SOLN nasal spray Place 2 sprays into both nostrils as needed for congestion. Before flonase   VITAMIN D PO Take by mouth daily.   Allergies  Allergen Reactions   Oxycodone Other (See Comments)    Feels bad   Dilaudid [Hydromorphone]     Too drowsy   Recent Results (from the past 2160 hour(s))  Comprehensive metabolic panel     Status: Abnormal   Collection Time: 01/23/22  8:01 AM  Result Value Ref Range   Sodium 140 135 - 145 mmol/L   Potassium 3.6 3.5 - 5.1 mmol/L   Chloride 106 98 - 111 mmol/L   CO2 26 22 - 32 mmol/L   Glucose, Bld 104 (H) 70 - 99 mg/dL    Comment: Glucose reference range applies only to samples taken after fasting for at least 8 hours.   BUN 9 8 - 23 mg/dL   Creatinine, Ser 8.460.85 0.44 - 1.00 mg/dL   Calcium 9.5 8.9 - 96.210.3 mg/dL   Total Protein 8.6 (H) 6.5 - 8.1 g/dL   Albumin 4.4 3.5 - 5.0 g/dL   AST 22 15 - 41 U/L   ALT 17 0 - 44 U/L   Alkaline Phosphatase 63 38 - 126 U/L   Total Bilirubin 0.7 0.3 - 1.2 mg/dL   GFR, Estimated >95>60 >28>60 mL/min    Comment: (NOTE) Calculated using the CKD-EPI Creatinine Equation (2021)    Anion gap 8 5 - 15    Comment: Performed at [facility], [address] [place], [place] [id number]  CBC     Status: Abnormal   Collection Time: 01/23/22  8:01 AM  Result Value Ref Range   WBC 9.2 4.0 - 10.5 K/uL   RBC 4.95 3.87 - 5.11 MIL/uL   Hemoglobin 13.3 12.0 - 15.0 g/dL   HCT 40.138.2 02.736.0 - 25.346.0 %   MCV 77.2 (L) 80.0 - 100.0 fL    MCH 26.9 26.0 - 34.0 pg   MCHC 34.8 30.0 - 36.0 g/dL  RDW 15.1 11.5 - 15.5 %   Platelets 270 150 - 400 K/uL   nRBC 0.0 0.0 - 0.2 %    Comment: Performed at [facility], [address] [place], [place] [address]  Type and screen [facility]     Status: None   Collection Time: 01/23/22  8:01 AM  Result Value Ref Range   ABO/RH(D) O POS    Antibody Screen NEG    Sample Expiration 01/26/2022,2359    Unit Number [id number]    Blood Component Type RED CELLS,LR    Unit division 00    Status of Unit ISSUED,FINAL    Transfusion Status OK TO TRANSFUSE    Crossmatch Result COMPATIBLE    Unit Number [id number]    Blood Component Type RED CELLS,LR    Unit division 00    Status of Unit ISSUED,FINAL    Transfusion Status OK TO TRANSFUSE    Crossmatch Result COMPATIBLE    Unit Number [id number]    Blood Component Type RED CELLS,LR    Unit division 00    Status of Unit REL FROM [facility]    Transfusion Status OK TO TRANSFUSE    Crossmatch Result COMPATIBLE    Unit Number [id number]    Blood Component Type RED CELLS,LR    Unit division 00    Status of Unit REL FROM [facility]    Transfusion Status OK TO TRANSFUSE    Crossmatch Result COMPATIBLE   BPAM RBC     Status: None   Collection Time: 01/23/22  8:01 AM  Result Value Ref Range   ISSUE DATE / TIME 644034742595    Blood Product Unit Number [id number]    PRODUCT CODE [id number]    Unit Type and Rh 5100    Blood Product Expiration Date 301601093235    ISSUE DATE / TIME 573220254270    Blood Product Unit Number [id number]    PRODUCT CODE H6073X10    Unit Type and Rh 5100    Blood Product Expiration Date 626948546270    Blood Product Unit Number [id number]    PRODUCT CODE [id number]    Unit Type and Rh 5100    Blood Product Expiration Date 381017510258    Blood Product Unit Number [id number]    PRODUCT CODE [id number]    Unit Type and Rh 5100    Blood Product Expiration Date  867619509326   HIV Antibody (routine testing w rflx)     Status: None   Collection Time: 01/23/22  8:18 AM  Result Value Ref Range   HIV Screen 4th Generation wRfx Non Reactive Non Reactive    Comment: Performed at [facility], [address], [place], [place] [address]  Hemoglobin and hematocrit, blood     Status: Abnormal   Collection Time: 01/23/22  8:18 AM  Result Value Ref Range   Hemoglobin 12.4 12.0 - 15.0 g/dL   HCT 80.9 (L) 98.3 - 38.2 %    Comment: Performed at [facility], [address] [place], [place] [address]  Prepare RBC (crossmatch)     Status: None   Collection Time: 01/23/22 12:15 PM  Result Value Ref Range   Order Confirmation      ORDER PROCESSED BY BLOOD BANK Performed at [facility], [address] [place], [place] [address]   Prepare RBC (crossmatch)     Status: None   Collection Time: 01/23/22 12:23 PM  Result Value Ref Range   Order Confirmation  ORDER PROCESSED BY BLOOD BANK Performed at [facility], [address] [place], [place] [address]   ABO/Rh     Status: None   Collection Time: 01/23/22 12:47 PM  Result Value Ref Range   ABO/RH(D)      O POS Performed at [facility], [address] [place], [place] [address]   Glucose, capillary     Status: Abnormal   Collection Time: 01/23/22  3:30 PM  Result Value Ref Range   Glucose-Capillary 114 (H) 70 - 99 mg/dL    Comment: Glucose reference range applies only to samples taken after fasting for at least 8 hours.  Hemoglobin and hematocrit, blood     Status: Abnormal   Collection Time: 01/23/22  4:33 PM  Result Value Ref Range   Hemoglobin 12.2 12.0 - 15.0 g/dL   HCT 09.8 (L) 11.9 - 14.7 %    Comment: Performed at [facility], [address] [place], [place] [address]  Iron and TIBC     Status: None   Collection Time: 01/23/22  4:33 PM  Result Value Ref Range   Iron 69 28 - 170 ug/dL   TIBC 213 086 - 578 ug/dL   Saturation  Ratios 22 10.4 - 31.8 %   UIBC 242 ug/dL    Comment: Performed at [facility], [address] [place], [place] [address]  Hemoglobin and hematocrit, blood     Status: Abnormal   Collection Time: 01/23/22 10:11 PM  Result Value Ref Range   Hemoglobin 12.1 12.0 - 15.0 g/dL   HCT 95.2 (L) 84.1 - 32.4 %    Comment: Performed at [facility], [address] [place], [place] [address]  CBC with Differential/Platelet     Status: Abnormal   Collection Time: 01/24/22 10:29 AM  Result Value Ref Range   WBC 17.4 (H) 4.0 - 10.5 K/uL   RBC 4.25 3.87 - 5.11 MIL/uL   Hemoglobin 11.7 (L) 12.0 - 15.0 g/dL   HCT 72.5 (L) 36.6 - 44.0 %   MCV 78.1 (L) 80.0 - 100.0 fL   MCH 27.5 26.0 - 34.0 pg   MCHC 35.2 30.0 - 36.0 g/dL   RDW 34.7 42.5 - 95.6 %   Platelets 206 150 - 400 K/uL   nRBC 0.0 0.0 - 0.2 %   Neutrophils Relative % 67 %   Neutro Abs 11.6 (H) 1.7 - 7.7 K/uL   Lymphocytes Relative 25 %   Lymphs Abs 4.4 (H) 0.7 - 4.0 K/uL   Monocytes Relative 7 %   Monocytes Absolute 1.3 (H) 0.1 - 1.0 K/uL   Eosinophils Relative 0 %   Eosinophils Absolute 0.0 0.0 - 0.5 K/uL   Basophils Relative 0 %   Basophils Absolute 0.0 0.0 - 0.1 K/uL   Immature Granulocytes 1 %   Abs Immature Granulocytes 0.10 (H) 0.00 - 0.07 K/uL    Comment: Performed at [facility], [address] [place], [place] [address]  Comprehensive metabolic panel     Status: Abnormal   Collection Time: 01/24/22 10:29 AM  Result Value Ref Range   Sodium 139 135 - 145 mmol/L   Potassium 3.4 (L) 3.5 - 5.1 mmol/L   Chloride 105 98 - 111 mmol/L   CO2 24 22 - 32 mmol/L   Glucose, Bld 115 (H) 70 - 99 mg/dL    Comment: Glucose reference range applies only to samples taken after fasting for at least 8 hours.   BUN 8  8 - 23 mg/dL   Creatinine, Ser 8.50 0.44 - 1.00 mg/dL   Calcium 9.2 8.9 - 27.7 mg/dL   Total Protein 7.7 6.5 - 8.1 g/dL   Albumin 3.7 3.5 - 5.0 g/dL   AST 30 15 - 41 U/L   ALT 15 0 - 44 U/L    Alkaline Phosphatase 43 38 - 126 U/L   Total Bilirubin 0.8 0.3 - 1.2 mg/dL   GFR, Estimated >41 >28 mL/min    Comment: (NOTE) Calculated using the CKD-EPI Creatinine Equation (2021)    Anion gap 10 5 - 15    Comment: Performed at [facility], [address] [place], [place] [address]  Magnesium     Status: None   Collection Time: 01/24/22 10:29 AM  Result Value Ref Range   Magnesium 1.9 1.7 - 2.4 mg/dL    Comment: Performed at [facility], [address] [place], [place] [address]  Phosphorus     Status: None   Collection Time: 01/24/22 10:29 AM  Result Value Ref Range   Phosphorus 3.3 2.5 - 4.6 mg/dL    Comment: Performed at [facility], [address] [place], [place] [address]  CBC     Status: Abnormal   Collection Time: 01/24/22  3:37 PM  Result Value Ref Range   WBC 16.7 (H) 4.0 - 10.5 K/uL   RBC 4.03 3.87 - 5.11 MIL/uL   Hemoglobin 11.0 (L) 12.0 - 15.0 g/dL   HCT 83.6 (L) 62.9 - 47.6 %   MCV 77.9 (L) 80.0 - 100.0 fL   MCH 27.3 26.0 - 34.0 pg   MCHC 35.0 30.0 - 36.0 g/dL   RDW 54.6 50.3 - 54.6 %   Platelets 194 150 - 400 K/uL   nRBC 0.0 0.0 - 0.2 %    Comment: Performed at [facility], [address] [place], [place] [address]   Objective  Body mass index is 38.11 kg/m. Wt Readings from Last 3 Encounters:  01/29/22 222 lb (100.7 kg)  01/23/22 229 lb 4.5 oz (104 kg)  01/14/22 224 lb 12.8 oz (102 kg)   Temp Readings from Last 3 Encounters:  01/29/22 98 F (36.7 C) (Oral)  01/24/22 98.1 F (36.7 C) (Oral)  01/14/22 98.1 F (36.7 C) (Oral)   BP Readings from Last 3 Encounters:  01/29/22 110/60  01/24/22 (!) 148/70  01/14/22 130/80   Pulse Readings from Last 3 Encounters:  01/29/22 80  01/24/22 98  01/14/22 90    Physical Exam Vitals and nursing note reviewed.  Constitutional:      Appearance: Normal appearance. She is well-developed and well-groomed.  HENT:     Head: Normocephalic and atraumatic.  Eyes:      Conjunctiva/sclera: Conjunctivae normal.     Pupils: Pupils are equal, round, and reactive to light.  Cardiovascular:     Rate and Rhythm: Normal rate and regular rhythm.     Heart sounds: Normal heart sounds. No murmur heard. Pulmonary:     Effort: Pulmonary effort is normal.     Breath sounds: Normal breath sounds.  Abdominal:     General: Abdomen is flat. Bowel sounds are normal.     Tenderness: There is no abdominal tenderness.  Musculoskeletal:        General: No tenderness.  Skin:    General: Skin is warm and dry.  Neurological:     General: No focal deficit present.     Mental Status: She is alert and oriented  to person, place, and time. Mental status is at baseline.     Cranial Nerves: Cranial nerves 2-12 are intact.     Motor: Motor function is intact.     Coordination: Coordination is intact.     Gait: Gait is intact.  Psychiatric:        Attention and Perception: Attention and perception normal.        Mood and Affect: Mood and affect normal.        Speech: Speech normal.        Behavior: Behavior normal. Behavior is cooperative.        Thought Content: Thought content normal.        Cognition and Memory: Cognition and memory normal.        Judgment: Judgment normal.     Assessment  Plan  Gastrointestinal hemorrhage associated with intestinal diverticulosis/diverticular bleeding 2nd episode in 15 years s/p 2 units of blood - Plan: CBC with Differential/Platelet, IBC + Ferritin, Ambulatory referral to Vascular Surgery  Hypotensive episode - Plan: Ambulatory referral to Vascular Surgery [place]   Hypokalemia - Plan: Basic Metabolic Panel (BMET)  Given high K food list   HM Flu shot utd Tdap [place] records [place]  Consider prevnar 20, shingrix further covid shots pna had 2019 [place]  -consider prevnar and pna 23 get records D.r Tawny Ho[place] Consider shingrix in future  Pfizer covid 3/3 rec 4th   04/02/20 mammo normal ordered sch 08/20/21 negative     Colonoscopy get records [place] obtained:  colonoscopy 10/15/06 mod to severe diverticulosis I[facility] f/u in 5 years  EGD 11/29/07 candida, mild gastritis  EGD 01/03/13/colonoscopy diverticulosis ext and int hemorrhoids TI with ulceration neg H pylori UGD 02/01/18 gastritis ? If had colonoscopy  GI established [place] f/u 08/20/20   Pap 10/2019 normal neg neg hpv   DEXA age 28 osteopenia calcium + vit D repeat 3-5 years had 04/02/20   Provider: Dr. French Ana [name]-Internal Medicine

## 2022-02-03 ENCOUNTER — Ambulatory Visit: Payer: Medicare HMO

## 2022-02-05 ENCOUNTER — Other Ambulatory Visit: Payer: Self-pay | Admitting: *Deleted

## 2022-02-05 DIAGNOSIS — D72820 Lymphocytosis (symptomatic): Secondary | ICD-10-CM

## 2022-02-05 NOTE — Progress Notes (Signed)
c 

## 2022-02-06 ENCOUNTER — Inpatient Hospital Stay: Payer: Medicare HMO | Attending: Oncology

## 2022-02-06 ENCOUNTER — Inpatient Hospital Stay: Payer: Medicare HMO

## 2022-02-06 DIAGNOSIS — Z79899 Other long term (current) drug therapy: Secondary | ICD-10-CM | POA: Diagnosis not present

## 2022-02-06 DIAGNOSIS — D72820 Lymphocytosis (symptomatic): Secondary | ICD-10-CM | POA: Insufficient documentation

## 2022-02-06 LAB — CBC WITH DIFFERENTIAL/PLATELET
Abs Immature Granulocytes: 0.04 K/uL (ref 0.00–0.07)
Basophils Absolute: 0 K/uL (ref 0.0–0.1)
Basophils Relative: 0 %
Eosinophils Absolute: 0.2 K/uL (ref 0.0–0.5)
Eosinophils Relative: 2 %
HCT: 35.7 % — ABNORMAL LOW (ref 36.0–46.0)
Hemoglobin: 12.5 g/dL (ref 12.0–15.0)
Immature Granulocytes: 0 %
Lymphocytes Relative: 35 %
Lymphs Abs: 4.2 K/uL — ABNORMAL HIGH (ref 0.7–4.0)
MCH: 28.2 pg (ref 26.0–34.0)
MCHC: 35 g/dL (ref 30.0–36.0)
MCV: 80.6 fL (ref 80.0–100.0)
Monocytes Absolute: 0.8 K/uL (ref 0.1–1.0)
Monocytes Relative: 7 %
Neutro Abs: 6.6 K/uL (ref 1.7–7.7)
Neutrophils Relative %: 56 %
Platelets: 304 K/uL (ref 150–400)
RBC: 4.43 MIL/uL (ref 3.87–5.11)
RDW: 15.3 % (ref 11.5–15.5)
WBC: 12 K/uL — ABNORMAL HIGH (ref 4.0–10.5)
nRBC: 0 % (ref 0.0–0.2)

## 2022-02-07 ENCOUNTER — Other Ambulatory Visit: Payer: Self-pay | Admitting: Internal Medicine

## 2022-02-07 DIAGNOSIS — K219 Gastro-esophageal reflux disease without esophagitis: Secondary | ICD-10-CM

## 2022-02-09 ENCOUNTER — Inpatient Hospital Stay: Payer: Medicare HMO | Admitting: Oncology

## 2022-02-12 ENCOUNTER — Encounter: Payer: Self-pay | Admitting: Gastroenterology

## 2022-02-12 ENCOUNTER — Other Ambulatory Visit: Payer: Self-pay

## 2022-02-12 ENCOUNTER — Ambulatory Visit (INDEPENDENT_AMBULATORY_CARE_PROVIDER_SITE_OTHER): Payer: Medicare HMO | Admitting: Gastroenterology

## 2022-02-12 ENCOUNTER — Ambulatory Visit: Payer: Medicare HMO | Admitting: Gastroenterology

## 2022-02-12 ENCOUNTER — Ambulatory Visit (INDEPENDENT_AMBULATORY_CARE_PROVIDER_SITE_OTHER): Payer: Medicare HMO

## 2022-02-12 VITALS — Ht 64.0 in | Wt 222.0 lb

## 2022-02-12 VITALS — BP 109/68 | HR 88 | Temp 98.4°F | Ht 64.0 in | Wt 225.5 lb

## 2022-02-12 DIAGNOSIS — K922 Gastrointestinal hemorrhage, unspecified: Secondary | ICD-10-CM

## 2022-02-12 DIAGNOSIS — K625 Hemorrhage of anus and rectum: Secondary | ICD-10-CM

## 2022-02-12 DIAGNOSIS — Z8719 Personal history of other diseases of the digestive system: Secondary | ICD-10-CM

## 2022-02-12 DIAGNOSIS — Z0000 Encounter for general adult medical examination without abnormal findings: Secondary | ICD-10-CM | POA: Diagnosis not present

## 2022-02-12 NOTE — Patient Instructions (Addendum)
  Ms. Handrich , Thank you for taking time to come for your Medicare Wellness Visit. I appreciate your ongoing commitment to your health goals. Please review the following plan we discussed and let me know if I can assist you in the future.   These are the goals we discussed:  Goals      High fiber diet        This is a list of the screening recommended for you and due dates:  Health Maintenance  Topic Date Due   COVID-19 Vaccine (4 - Pfizer series) 02/28/2022*   Zoster (Shingles) Vaccine (1 of 2) 05/15/2022*   Pneumonia Vaccine (1 - PCV) 02/13/2023*   Flu Shot  02/17/2022   Colon Cancer Screening  01/04/2023   Mammogram  08/21/2023   Tetanus Vaccine  08/21/2031   DEXA scan (bone density measurement)  Completed   Hepatitis C Screening: USPSTF Recommendation to screen - Ages 85-79 yo.  Completed   HPV Vaccine  Aged Out  *Topic was postponed. The date shown is not the original due date.

## 2022-02-12 NOTE — Progress Notes (Signed)
[name], MD [address]  [place], [place] [address]  Main: [phone]  Fax: [phone]    Gastroenterology Consultation  Referring Provider:     [name], Olivia Mackie * Primary Care Physician:  [name], Nino Glow, MD Primary Gastroenterologist:  Dr. [name] Reason for Consultation: Recent lower GI bleed        HPI:   Cheryl Coleman is a 66 y.o. female referred by Dr. Terese Door, Nino Glow, MD  for consultation & management of GERD, chronic constipation  GERD: Patient has history of chronic GERD, underwent several EGDs in the past which did not reveal erosive esophagitis, showed intraepithelial lymphocytes on biopsies. Patient reports having heartburn associated with regurgitation for several months, including nocturnal symptoms.  Currently taking omeprazole 40 mg once a day which provides partial relief only.  She also reports localized pain in her epigastric area.  Patient has gained about 25 pounds within last 1 year and patient does not feel her normal self because she was not this heavy before.  She is planning to go on a [employer] diet. She consumes rice and steak regularly She moved in later part of 2019 from [place] to [place].  Since then with the onset of pandemic, she has not been able to go out nor visit her children. Has been working from home.  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  She has been going back and forth between [place] and [place] due to some personal issues.  She found her life to be very stressful staying home, and discontinue place within last 1 year.  Patient is originally from [place]  Chronic constipation: This has also started within last 1 year, has gotten worse due to decreased physical activity and lack of fiber in her diet.  She takes MiraLAX as needed only. She underwent colonoscopy in 2014 which revealed superficial  ulceration in the terminal ileum, biopsies revealed chronic inflammation.  Patient reported that she had a repeat colonoscopy in 2019, report is not available with me today.  Patient denies abdominal pain, rectal bleeding, nausea or vomiting  She does not smoke or drink alcohol She denies family history of GI malignancy  Follow-up visit 08/20/2020 Patient is here to discuss about her upper GI symptoms.  She reports that about a month ago, she did not eat all day and was having dinner, and she was hungry, trying to eat fast, felt like the food got stuck in the substernal area, followed by syncope.  She went to ER, she was told that she had vasovagal attack.  Since then, patient reports that every time she has dinner, she has been extra careful and little anxious, chews thoroughly.  She has been on omeprazole 40 mg twice daily for about a year and she thinks it is not helping.  She does report feeling gassy.  She reports that her bowel movements are fairly regular.  She has been in [place] most of the year in 2021, taking care of her granddaughter and therefore canceled her upper endoscopy.  She had history of Candida esophagitis  Follow-up visit 02/12/2022 Patient was recently admitted to [facility] on 7/7 secondary to several episodes of rectal bleeding, CT angio was positive for diverticular bleed in the distal descending and proximal sigmoid colon, s/p embolization.  Patient was found to have drop in hemoglobin from 13.3 to 11.0.  Patient reports that since  discharge, she has been having brown bowel movements.  She started taking Metamucil along with MiraLAX and noticed hot sensation in her lower abdomen that radiates to her upper abdomen and she is worried about this feeling.  She stopped taking MiraLAX, currently taking Metamucil only.  She has not lost any weight.  She denies any further episodes of rectal bleeding.  She denies any constipation, sensation of incomplete emptying  NSAIDs:  None  Antiplts/Anticoagulants/Anti thrombotics: None  GI Procedures:  Upper endoscopy 08/29/2020 DIAGNOSIS:  A. DUODENUM; COLD BIOPSY:  - UNREMARKABLE DUODENAL MUCOSA.  - NEGATIVE FOR DYSPLASIA AND MALIGNANCY.   B.  STOMACH, RANDOM; COLD BIOPSY:  - ANTRAL MUCOSA WITH MODERATE CHRONIC ACTIVE GASTRITIS.  - DEFINITIVE HELICOBACTER ORGANISMS ARE NOT IDENTIFIED ON IHC, SEE  COMMENT.  - OXYNTIC MUCOSA WITH MILD CHRONIC GASTRITIS AND FEATURES CONSISTENT  WITH PROTON PUMP INHIBITOR EFFECT.  - NEGATIVE FOR DYSPLASIA AND MALIGNANCY.   C.  ESOPHAGUS, RANDOM; COLD BIOPSY:  - FRAGMENTS OF SQUAMOUS MUCOSA WITH FOCAL CHANGES CONSISTENT WITH  REFLUX.  - NEGATIVE FOR DYSPLASIA AND MALIGNANCY.    [facility] 02/01/2018 at [facility]    EGD and colonoscopy 01/03/2013 at [facility]      EGD 11/29/2007 at [facility]  No evidence of H. Pylori  Past Medical History:  Diagnosis Date   Arthritis    Asthma    COVID    COVID-19    07/24/20, 05/19/21   Diverticulitis    GERD (gastroesophageal reflux disease)    GIB (gastrointestinal bleeding)    diverticular 01/23/22-01/24/22 [facility] s/p emobolization & 15 years ago in [place]   Hiatal hernia    History of blood transfusion    History of chicken pox    History of GI bleed    2013/14   Hyperlipidemia    Hypertension    Leukocytosis    Type O blood, Rh positive     Past Surgical History:  Procedure Laterality Date   BREAST CYST ASPIRATION Right    CESAREAN SECTION     04/27/84   COLONOSCOPY     EMBOLIZATION N/A 01/23/2022   Procedure: EMBOLIZATION;  Surgeon: Katha Cabal, MD;  Location: [place] [facility];  Service: Cardiovascular;  Laterality: N/A;   ESOPHAGOGASTRODUODENOSCOPY (EGD) WITH PROPOFOL N/A 08/29/2020   Procedure: ESOPHAGOGASTRODUODENOSCOPY (EGD) WITH PROPOFOL;  Surgeon: Lin Landsman, MD;  Location: [facility] ENDOSCOPY;  Service: Gastroenterology;  Laterality: N/A;   ORIF ACETABULAR FRACTURE       Current Outpatient Medications:    Acetaminophen (TYLENOL PO), Take by mouth., Disp: , Rfl:    albuterol (VENTOLIN HFA) 108 (90 Base) MCG/ACT inhaler, INHALE 1-2 PUFFS BY MOUTH EVERY 6 (SIX) HOURS AS NEEDED, Disp: 18 each, Rfl: 11   Ascorbic Acid (VITAMIN C PO), Take by mouth daily., Disp: , Rfl:    atorvastatin (LIPITOR) 10 MG tablet, TAKE 1 TABLET (10 MG TOTAL) BY MOUTH DAILY AT 6 PM., Disp: 90 tablet, Rfl: 3   CALCIUM PO, Take by mouth daily., Disp: , Rfl:    citalopram (CELEXA) 10 MG tablet, Take 1 tablet (10 mg total) by mouth daily., Disp: 90 tablet, Rfl: 3   diltiazem (CARDIZEM CD) 120 MG 24 hr capsule, Take 1 capsule (120 mg total) by mouth daily. qhs, Disp: 90 capsule, Rfl: 3   diltiazem (CARDIZEM CD) 240 MG 24 hr capsule, Take 1 capsule (240 mg total) by mouth daily. In am, Disp: 90 capsule, Rfl: 3   fluticasone (FLONASE) 50 MCG/ACT  nasal spray, SPRAY 2 SPRAYS INTO EACH NOSTRIL EVERY DAY, Disp: 48 mL, Rfl: 3   Loratadine (CLARITIN PO), Take by mouth as needed., Disp: , Rfl:    montelukast (SINGULAIR) 10 MG tablet, TAKE 1 TABLET BY MOUTH EVERYDAY AT BEDTIME, Disp: 90 tablet, Rfl: 3   Multiple Vitamins-Minerals (ZINC PO), Take by mouth daily., Disp: , Rfl:    olmesartan (BENICAR) 5 MG tablet, Take 2 tablets (10 mg total) by mouth daily., Disp: 180 tablet, Rfl: 3   omeprazole (PRILOSEC) 40 MG capsule, TAKE 1 CAPSULE BY MOUTH TWICE A DAY, Disp: 180 capsule, Rfl: 0   sodium chloride (OCEAN) 0.65 % SOLN nasal spray, Place 2 sprays into both nostrils as needed for congestion. Before flonase, Disp: 90 mL, Rfl: 3   Specialty Vitamins Products (WOMENS VITA PAK PO), Take by mouth daily., Disp: , Rfl:    VITAMIN D PO, Take by mouth daily., Disp: , Rfl:    Family History  Problem Relation Age of Onset   Hypertension Maternal Aunt    Diabetes Maternal Aunt    Breast cancer Neg Hx      Social History   Tobacco Use   Smoking status: Never   Smokeless tobacco: Never  Vaping Use    Vaping Use: Never used  Substance Use Topics   Alcohol use: Not Currently   Drug use: Not Currently    Allergies as of 02/12/2022 - Review Complete 02/12/2022  Allergen Reaction Noted   Oxycodone Other (See Comments) 08/18/2018   Dilaudid [hydromorphone]  01/29/2022    Review of Systems:    All systems reviewed and negative except where noted in HPI.   Physical Exam:  BP 109/68 (BP Location: Left Arm, Patient Position: Sitting, Cuff Size: Large)   Pulse 88   Temp 98.4 F (36.9 C) (Oral)   Ht 5\' 4"  (1.626 m)   Wt 225 lb 8 oz (102.3 kg)   BMI 38.71 kg/m  No LMP recorded. Patient is postmenopausal.  General:   Alert,  Well-developed, well-nourished, pleasant and cooperative in NAD Head:  Normocephalic and atraumatic. Eyes:  Sclera clear, no icterus.   Conjunctiva pink. Ears:  Normal auditory acuity. Nose:  No deformity, discharge, or lesions. Mouth:  No deformity or lesions,oropharynx pink & moist. Neck:  Supple; no masses or thyromegaly. Lungs:  Respirations even and unlabored.  Clear throughout to auscultation.   No wheezes, crackles, or rhonchi. No acute distress. Heart:  Regular rate and rhythm; no murmurs, clicks, rubs, or gallops. Abdomen:  Normal bowel sounds. Soft, non-tender and mildly distended, tympanic without masses, hepatosplenomegaly or hernias noted.  No guarding or rebound tenderness.   Rectal: Not performed Msk:  Symmetrical without gross deformities. Good, equal movement & strength bilaterally. Pulses:  Normal pulses noted. Extremities:  No clubbing or edema.  No cyanosis. Neurologic:  Alert and oriented x3;  grossly normal neurologically. Skin:  Intact without significant lesions or rashes. No jaundice. Psych:  Alert and cooperative. Normal mood and affect.  Imaging Studies: No abdominal imaging  Assessment and Plan:   Cheryl Coleman is a 66 y.o. female with morbid obesity, hypertension, hyperlipidemia, chronic GERD is seen in consultation for recent  episode of diverticular bleed s/p embolization  Recommend diagnostic colonoscopy and patient is agreeable Advised patient to stop taking Metamucil, continue MiraLAX  I have discussed alternative options, risks & benefits,  which include, but are not limited to, bleeding, infection, perforation,respiratory complication & drug reaction.  The patient agrees with this plan & written  consent will be obtained.    Low blood pressure Patient is concerned that her blood pressure has been running low lately Advised her to monitor blood pressure at home before taking antihypertensives, maintain a log and discuss with her PCP regarding adjustment of antihypertensives  Follow up in 3 months   [name], MD

## 2022-02-12 NOTE — Progress Notes (Signed)
Subjective:   Cheryl Coleman is a 66 y.o. female who presents for an Initial Medicare Annual Wellness Visit.  Review of Systems    No ROS.  Medicare Wellness Virtual Visit.  Visual/audio telehealth visit, UTA vital signs.   See social history for additional risk factors.   Cardiac Risk Factors include: advanced age (>34men, >26 women);hypertension     Objective:    There were no vitals filed for this visit. There is no height or weight on file to calculate BMI.     01/23/2022    7:07 AM 08/11/2021    1:24 PM 07/25/2021   10:43 AM 08/29/2020    8:04 AM 07/04/2020    5:43 PM  Advanced Directives  Does Patient Have a Medical Advance Directive? No No No No No  Would patient like information on creating a medical advance directive? No - Patient declined No - Patient declined No - Patient declined  No - Patient declined    Current Medications (verified) Outpatient Encounter Medications as of 02/12/2022  Medication Sig   Acetaminophen (TYLENOL PO) Take by mouth.   albuterol (VENTOLIN HFA) 108 (90 Base) MCG/ACT inhaler INHALE 1-2 PUFFS BY MOUTH EVERY 6 (SIX) HOURS AS NEEDED   ALPRAZolam (XANAX) 0.25 MG tablet Take 0.5-1 tablets (0.125-0.25 mg total) by mouth daily as needed for anxiety. (Patient not taking: Reported on 01/14/2022)   Ascorbic Acid (VITAMIN C PO) Take by mouth daily.   atorvastatin (LIPITOR) 10 MG tablet TAKE 1 TABLET (10 MG TOTAL) BY MOUTH DAILY AT 6 PM.   CALCIUM PO Take by mouth daily.   citalopram (CELEXA) 10 MG tablet Take 1 tablet (10 mg total) by mouth daily.   diltiazem (CARDIZEM CD) 120 MG 24 hr capsule Take 1 capsule (120 mg total) by mouth daily. qhs   diltiazem (CARDIZEM CD) 240 MG 24 hr capsule Take 1 capsule (240 mg total) by mouth daily. In am   fluticasone (FLONASE) 50 MCG/ACT nasal spray SPRAY 2 SPRAYS INTO EACH NOSTRIL EVERY DAY (Patient not taking: Reported on 01/29/2022)   Loratadine (CLARITIN PO) Take by mouth as needed.   montelukast (SINGULAIR) 10 MG  tablet TAKE 1 TABLET BY MOUTH EVERYDAY AT BEDTIME   Multiple Vitamins-Minerals (ZINC PO) Take by mouth daily.   olmesartan (BENICAR) 5 MG tablet Take 2 tablets (10 mg total) by mouth daily.   omeprazole (PRILOSEC) 40 MG capsule TAKE 1 CAPSULE BY MOUTH TWICE A DAY   sodium chloride (OCEAN) 0.65 % SOLN nasal spray Place 2 sprays into both nostrils as needed for congestion. Before flonase   Specialty Vitamins Products (WOMENS VITA PAK PO) Take by mouth daily. (Patient not taking: Reported on 01/29/2022)   VITAMIN D PO Take by mouth daily.   [DISCONTINUED] nystatin (MYCOSTATIN) 100000 UNIT/ML suspension Take 5 mLs (500,000 Units total) by mouth 4 (four) times daily. Swish, gargle and swallow (Patient not taking: Reported on 01/29/2022)   [DISCONTINUED] Olopatadine HCl (PATADAY) 0.7 % SOLN Apply 1 drop to eye daily as needed.   No facility-administered encounter medications on file as of 02/12/2022.    Allergies (verified) Oxycodone and Dilaudid [hydromorphone]   History: Past Medical History:  Diagnosis Date   Arthritis    Asthma    COVID    COVID-19    07/24/20, 05/19/21   Diverticulitis    GERD (gastroesophageal reflux disease)    GIB (gastrointestinal bleeding)    diverticular 01/23/22-01/24/22 [facility] s/p emobolization & 15 years ago in [place]   Hiatal hernia  History of blood transfusion    History of chicken pox    History of GI bleed    2013/14   Hyperlipidemia    Hypertension    Leukocytosis    Type O blood, Rh positive    Past Surgical History:  Procedure Laterality Date   BREAST CYST ASPIRATION Right    CESAREAN SECTION     04/27/84   COLONOSCOPY     EMBOLIZATION N/A 01/23/2022   Procedure: EMBOLIZATION;  Surgeon: [name], MD;  Location: [facility] INVASIVE [facility] LAB;  Service: Cardiovascular;  Laterality: N/A;   ESOPHAGOGASTRODUODENOSCOPY (EGD) WITH PROPOFOL N/A 08/29/2020   Procedure: ESOPHAGOGASTRODUODENOSCOPY (EGD) WITH PROPOFOL;  Surgeon: [name], MD;  Location:  [facility] ENDOSCOPY;  Service: Gastroenterology;  Laterality: N/A;   ORIF ACETABULAR FRACTURE     Family History  Problem Relation Age of Onset   Hypertension Maternal Aunt    Diabetes Maternal Aunt    Breast cancer Neg Hx    Social History   Socioeconomic History   Marital status: Married    Spouse name: Not on file   Number of children: Not on file   Years of education: Not on file   Highest education level: Not on file  Occupational History   Not on file  Tobacco Use   Smoking status: Never   Smokeless tobacco: Never  Vaping Use   Vaping Use: Never used  Substance and Sexual Activity   Alcohol use: Not Currently   Drug use: Not Currently   Sexual activity: Yes    Comment: husband   Other Topics Concern   Not on file  Social History Narrative   Lived in [place] from [place]    Married 1st husband died in late 81s/2000s   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, wears seat belt, safe in relationship    Social Determinants of Health   Financial Resource Strain: Not on file  Food Insecurity: No Food Insecurity (02/12/2022)   Hunger Vital Sign    Worried About Running Out of Food in the Last Year: Never true    Ran Out of Food in the Last Year: Never true  Transportation Needs: Not on file  Physical Activity: Not on file  Stress: Not on file  Social Connections: Not on file    Tobacco Counseling Counseling given: Not Answered   Clinical Intake:  Pre-visit preparation completed: Yes        Diabetes: No  How often do you need to have someone help you when you read instructions, pamphlets, or other written materials from your doctor or pharmacy?: 1 - Never    Interpreter Needed?: No      Activities of Daily Living    02/12/2022   12:33 PM 01/23/2022    6:47 PM  In your present state of health, do you have any difficulty performing the following activities:  Hearing? 0   Vision? 0   Difficulty concentrating or making decisions? 0   Walking or climbing stairs? 0    Dressing or bathing? 0   Doing errands, shopping? 0 0  Preparing Food and eating ? N   Using the Toilet? N   In the past six months, have you accidently leaked urine? N   Do you have problems with loss of bowel control? N   Managing your Medications? N   Managing your Finances? N   Housekeeping or managing your Housekeeping? N     Patient Care Team:  [name], [name], MD as PCP - General (Internal Medicine) Debbe Odea, MD as PCP - Cardiology (Cardiology) Creig Hines, MD as Consulting Physician (Hematology and Oncology) [name], MD as Consulting Physician (Gastroenterology)  Indicate any recent Medical Services you may have received from other than [facility] providers in the past year (date may be approximate).     Assessment:   This is a routine wellness examination for Markel.  Virtual Visit via Telephone Note  I connected with  Laurina Bustle on 02/12/22 at 10:00 AM EDT by telephone and verified that I am speaking with the correct person using two identifiers.  Location: Patient: home Provider: office Persons participating in the virtual visit: patient/Nurse Health Advisor   I discussed the limitations of performing an evaluation and management service by telehealth. We continued and completed visit with audio only. Some vital signs may be absent or patient reported.   Hearing/Vision screen Hearing Screening - Comments:: Patient is able to hear conversational tones without difficulty.  No issues reported. Vision Screening - Comments:: Followed by [facility] They have seen their ophthalmologist in the last 12 months.    Dietary issues and exercise activities discussed: Current Exercise Habits: Home exercise routine, Intensity: Mild Regular diet Good water intake   Goals Addressed             This Visit's Progress    High fiber diet         Depression Screen    02/12/2022   10:06 AM 01/29/2022   10:43 AM 01/14/2022    8:16 AM  12/13/2020    8:27 AM 11/03/2019   10:23 AM 06/06/2019    1:03 PM 08/18/2018   11:11 AM  PHQ 2/9 Scores  PHQ - 2 Score 0 0 0 0 0 0 0  PHQ- 9 Score    1       Fall Risk    01/29/2022   10:43 AM 01/14/2022    8:15 AM 12/13/2020    7:31 AM 07/23/2020    2:10 PM 05/31/2020    4:16 PM  Fall Risk   Falls in the past year? 1 1 0 0 0  Number falls in past yr: 1 0 0 0   Injury with Fall? 0 0 0 0   Risk for fall due to : History of fall(s) History of fall(s)     Follow up Falls evaluation completed Falls evaluation completed Falls evaluation completed Falls evaluation completed Falls evaluation completed    FALL RISK PREVENTION PERTAINING TO THE HOME: Home free of loose throw rugs in walkways, pet beds, electrical cords, etc? Yes  Adequate lighting in your home to reduce risk of falls? Yes   ASSISTIVE DEVICES UTILIZED TO PREVENT FALLS: Life alert? No  Use of a cane, walker or w/c? No   TIMED UP AND GO: Was the test performed? No .   Cognitive Function:  Patient is alert and oriented x3.      Immunizations Immunization History  Administered Date(s) Administered   Influenza,inj,Quad PF,6+ Mos 04/23/2018, 06/01/2019, 03/11/2021   PFIZER Comirnaty(Gray Top)Covid-19 Tri-Sucrose Vaccine 03/11/2021   PFIZER(Purple Top)SARS-COV-2 Vaccination 11/22/2019, 12/13/2019   Tdap 08/20/2021    TDAP status: Due, Education has been provided regarding the importance of this vaccine. Advised may receive this vaccine at local pharmacy or Health Dept. Aware to provide a copy of the vaccination record if obtained from local pharmacy or Health Dept. Verbalized acceptance and understanding.  Pneumococcal vaccine status: Due, Education has  been provided regarding the importance of this vaccine. Advised may receive this vaccine at local pharmacy or Health Dept. Aware to provide a copy of the vaccination record if obtained from local pharmacy or Health Dept. Verbalized acceptance and understanding.   Shingrix  Completed?: No.    Education has been provided regarding the importance of this vaccine. Patient has been advised to call insurance company to determine out of pocket expense if they have not yet received this vaccine. Advised may also receive vaccine at local pharmacy or Health Dept. Verbalized acceptance and understanding.  Screening Tests Health Maintenance  Topic Date Due   COVID-19 Vaccine (4 - Pfizer series) 02/28/2022 (Originally 05/06/2021)   Zoster Vaccines- Shingrix (1 of 2) 05/15/2022 (Originally 07/25/2004)   Pneumonia Vaccine 20+ Years old (1 - PCV) 02/13/2023 (Originally 07/26/2019)   INFLUENZA VACCINE  02/17/2022   COLONOSCOPY (Pts 45-27yrs Insurance coverage will need to be confirmed)  01/04/2023   MAMMOGRAM  08/21/2023   TETANUS/TDAP  08/21/2031   DEXA SCAN  Completed   Hepatitis C Screening  Completed   HPV VACCINES  Aged Out   Health Maintenance There are no preventive care reminders to display for this patient.  Lung Cancer Screening: (Low Dose CT Chest recommended if Age 69-80 years, 30 pack-year currently smoking OR have quit w/in 15years.) does not qualify.   Vision Screening: Recommended annual ophthalmology exams for early detection of glaucoma and other disorders of the eye.  Dental Screening: Recommended annual dental exams for proper oral hygiene  Community Resource Referral / Chronic Care Management: CRR required this visit?  No   CCM required this visit?  No      Plan:   Keep all routine maintenance appointments.   I have personally reviewed and noted the following in the patient's chart:   Medical and social history Use of alcohol, tobacco or illicit drugs  Current medications and supplements including opioid prescriptions. Patient is not currently taking opioid prescriptions. Functional ability and status Nutritional status Physical activity Advanced directives List of other physicians Hospitalizations, surgeries, and ER visits in previous 12  months Vitals Screenings to include cognitive, depression, and falls Referrals and appointments  In addition, I have reviewed and discussed with patient certain preventive protocols, quality metrics, and best practice recommendations. A written personalized care plan for preventive services as well as general preventive health recommendations were provided to patient.     Ashok Pall, LPN   [id number]

## 2022-02-12 NOTE — Patient Instructions (Signed)
Gave [employer] Samples to you. Please let me know how you do take them and we can call you in a prescription

## 2022-02-13 ENCOUNTER — Ambulatory Visit: Payer: Medicare HMO | Admitting: Internal Medicine

## 2022-02-13 ENCOUNTER — Encounter: Payer: Self-pay | Admitting: Oncology

## 2022-02-13 ENCOUNTER — Inpatient Hospital Stay (HOSPITAL_BASED_OUTPATIENT_CLINIC_OR_DEPARTMENT_OTHER): Payer: Medicare HMO | Admitting: Oncology

## 2022-02-13 DIAGNOSIS — D72820 Lymphocytosis (symptomatic): Secondary | ICD-10-CM

## 2022-02-13 NOTE — Progress Notes (Signed)
I connected with Cheryl Coleman on 02/13/22 at  8:30 AM EDT by video enabled telemedicine visit and verified that I am speaking with the correct person using two identifiers.   I discussed the limitations, risks, security and privacy concerns of performing an evaluation and management service by telemedicine and the availability of in-person appointments. I also discussed with the patient that there may be a patient responsible charge related to this service. The patient expressed understanding and agreed to proceed.  Other persons participating in the visit and their role in the encounter:  none  Patient's location:  home Provider's location:  work  Stage manager Complaint: Routine follow-up of lymphocytosis  History of present illness: Patient is a 66 year old female with a past medical history significant for GERD hiatal hernia hypertension hyperlipidemia who has been referred to [place] for leukocytosis.  Looking back at her prior CBCs her white cell count typically fluctuates between 9.5-11.5.  More recently on 07/08/2021 her white count was 9.2 with an H&H of 13.1/29.2 and a platelet count of 247.  Her hemoglobin and platelet counts have always been normal.  Her differential has shown relative lymphocytosis with an absolute lymphocyte count that ranges between 4000-4800.  Patient denies any B symptoms presently   Results of blood work from 1/6/2023Showed white cell count of 10, H&H of 13.3/36.9 with an MCV of 77 and a platelet count of 255.  Flow cytometry showed polyclonal B-cell lymphocytosis.  Reactive process or biclonal lymphoproliferative disorder with nonspecific phenotype.  Absolute eosinophilia.    Interval history she is doing well presently and denies any specific complaints at this time.  No changes in her appetite or weight.   Review of Systems  Constitutional:  Negative for chills, fever, malaise/fatigue and weight loss.  HENT:  Negative for congestion, ear discharge and nosebleeds.   Eyes:   Negative for blurred vision.  Respiratory:  Negative for cough, hemoptysis, sputum production, shortness of breath and wheezing.   Cardiovascular:  Negative for chest pain, palpitations, orthopnea and claudication.  Gastrointestinal:  Negative for abdominal pain, blood in stool, constipation, diarrhea, heartburn, melena, nausea and vomiting.  Genitourinary:  Negative for dysuria, flank pain, frequency, hematuria and urgency.  Musculoskeletal:  Negative for back pain, joint pain and myalgias.  Skin:  Negative for rash.  Neurological:  Negative for dizziness, tingling, focal weakness, seizures, weakness and headaches.  Endo/Heme/Allergies:  Does not bruise/bleed easily.  Psychiatric/Behavioral:  Negative for depression and suicidal ideas. The patient does not have insomnia.     Allergies  Allergen Reactions   Oxycodone Other (See Comments)    Feels bad   Dilaudid [Hydromorphone]     Too drowsy    Past Medical History:  Diagnosis Date   Arthritis    Asthma    COVID    COVID-19    07/24/20, 05/19/21   Diverticulitis    GERD (gastroesophageal reflux disease)    GIB (gastrointestinal bleeding)    diverticular 01/23/22-01/24/22 [facility] s/p emobolization & 15 years ago in [place]   Hiatal hernia    History of blood transfusion    History of chicken pox    History of GI bleed    2013/14   Hyperlipidemia    Hypertension    Leukocytosis    Type O blood, Rh positive     Past Surgical History:  Procedure Laterality Date   BREAST CYST ASPIRATION Right    CESAREAN SECTION     04/27/84   COLONOSCOPY     EMBOLIZATION N/A 01/23/2022  Procedure: EMBOLIZATION;  Surgeon: [name], MD;  Location: [facility] INVASIVE CV LAB;  Service: Cardiovascular;  Laterality: N/A;   ESOPHAGOGASTRODUODENOSCOPY (EGD) WITH PROPOFOL N/A 08/29/2020   Procedure: ESOPHAGOGASTRODUODENOSCOPY (EGD) WITH PROPOFOL;  Surgeon: [name], MD;  Location: [facility] ENDOSCOPY;  Service: Gastroenterology;  Laterality: N/A;    ORIF ACETABULAR FRACTURE      Social History   Socioeconomic History   Marital status: Married    Spouse name: Not on file   Number of children: Not on file   Years of education: Not on file   Highest education level: Not on file  Occupational History   Not on file  Tobacco Use   Smoking status: Never   Smokeless tobacco: Never  Vaping Use   Vaping Use: Never used  Substance and Sexual Activity   Alcohol use: Not Currently   Drug use: Not Currently   Sexual activity: Yes    Comment: husband   Other Topics Concern   Not on file  Social History Narrative   Lived in [place] from [place]    Married 1st husband died in late 3s/2000s   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, wears seat belt, safe in relationship    Social Determinants of Health   Financial Resource Strain: Not on file  Food Insecurity: No Food Insecurity (02/12/2022)   Hunger Vital Sign    Worried About Running Out of Food in the Last Year: Never true    Ran Out of Food in the Last Year: Never true  Transportation Needs: Not on file  Physical Activity: Not on file  Stress: Not on file  Social Connections: Not on file  Intimate Partner Violence: Not At Risk (02/12/2022)   Humiliation, Afraid, Rape, and Kick questionnaire    Fear of Current or Ex-Partner: No    Emotionally Abused: No    Physically Abused: No    Sexually Abused: No    Family History  Problem Relation Age of Onset   Hypertension Maternal Aunt    Diabetes Maternal Aunt    Breast cancer Neg Hx      Current Outpatient Medications:    Acetaminophen (TYLENOL PO), Take by mouth., Disp: , Rfl:    albuterol (VENTOLIN HFA) 108 (90 Base) MCG/ACT inhaler, INHALE 1-2 PUFFS BY MOUTH EVERY 6 (SIX) HOURS AS NEEDED, Disp: 18 each, Rfl: 11   Ascorbic Acid (VITAMIN C PO), Take by mouth daily., Disp: , Rfl:    atorvastatin (LIPITOR) 10 MG tablet, TAKE 1 TABLET (10 MG TOTAL) BY MOUTH DAILY AT 6 PM., Disp: 90 tablet, Rfl: 3   CALCIUM PO, Take by mouth daily.,  Disp: , Rfl:    citalopram (CELEXA) 10 MG tablet, Take 1 tablet (10 mg total) by mouth daily., Disp: 90 tablet, Rfl: 3   diltiazem (CARDIZEM CD) 120 MG 24 hr capsule, Take 1 capsule (120 mg total) by mouth daily. qhs, Disp: 90 capsule, Rfl: 3   diltiazem (CARDIZEM CD) 240 MG 24 hr capsule, Take 1 capsule (240 mg total) by mouth daily. In am, Disp: 90 capsule, Rfl: 3   fluticasone (FLONASE) 50 MCG/ACT nasal spray, SPRAY 2 SPRAYS INTO EACH NOSTRIL EVERY DAY, Disp: 48 mL, Rfl: 3   Loratadine (CLARITIN PO), Take by mouth as needed., Disp: , Rfl:    montelukast (SINGULAIR) 10 MG tablet, TAKE 1 TABLET BY MOUTH EVERYDAY AT BEDTIME, Disp: 90 tablet, Rfl: 3   Multiple Vitamins-Minerals (ZINC PO), Take by mouth daily.,  Disp: , Rfl:    olmesartan (BENICAR) 5 MG tablet, Take 2 tablets (10 mg total) by mouth daily., Disp: 180 tablet, Rfl: 3   omeprazole (PRILOSEC) 40 MG capsule, TAKE 1 CAPSULE BY MOUTH TWICE A DAY, Disp: 180 capsule, Rfl: 0   sodium chloride (OCEAN) 0.65 % SOLN nasal spray, Place 2 sprays into both nostrils as needed for congestion. Before flonase, Disp: 90 mL, Rfl: 3   Specialty Vitamins Products (WOMENS VITA PAK PO), Take by mouth daily., Disp: , Rfl:    VITAMIN D PO, Take by mouth daily., Disp: , Rfl:   PERIPHERAL VASCULAR CATHETERIZATION  Result Date: 01/23/2022 See surgical note for result.  DG Chest Port 1 View  Result Date: 01/23/2022 CLINICAL DATA:  66 year old female status post central line placement. EXAM: PORTABLE CHEST - 1 VIEW COMPARISON:  10/06/2018 FINDINGS: The mediastinal contours are within normal limits. No cardiomegaly. Interval insertion of right internal jugular vein central venous catheter with the catheter tip in the right atrium. Mild cephalization of the pulmonary vasculature. No focal consolidations or evidence of significant pleural effusion or pneumothorax. No acute osseous abnormality. IMPRESSION: 1. No evidence of pneumothorax after insertion of right internal  jugular central venous catheter with the tip in the right atrium. 2. Pulmonary vascular congestion without evidence of edema. Marliss Coots, MD [facility] Electronically Signed   By: Marliss Coots M.D.   On: 01/23/2022 12:31   CT ANGIO GI BLEED  Result Date: 01/23/2022 CLINICAL DATA:  Lower GI bleed acute EXAM: CTA ABDOMEN AND PELVIS WITHOUT AND WITH CONTRAST TECHNIQUE: Multidetector CT imaging of the abdomen and pelvis was performed using the standard protocol during bolus administration of intravenous contrast. Multiplanar reconstructed images and MIPs were obtained and reviewed to evaluate the vascular anatomy. RADIATION DOSE REDUCTION: This exam was performed according to the departmental dose-optimization program which includes automated exposure control, adjustment of the mA and/or kV according to patient size and/or use of iterative reconstruction technique. CONTRAST:  OMNIPAQUE IOHEXOL 350 MG/ML SOLN COMPARISON:  None Available. FINDINGS: VASCULAR Contrast extravasation is seen into the lumen of the colon at the junction of the distal descending and proximal sigmoid colon on arterial phase imaging, intraluminal contrast increases on venous phase imaging. Normal caliber abdominal aorta with mild atherosclerotic disease. Major branch vessels are patent with no evidence of significant stenosis. NON-VASCULAR Lower chest: Mild ground-glass opacities of the right lower lobe. Hepatobiliary: No suspicious liver lesions. Cholelithiasis with no gallbladder wall thickening. No biliary ductal dilation. Pancreas: Unremarkable. No pancreatic ductal dilatation or surrounding inflammatory changes. Spleen: Normal in size without focal abnormality. Adrenals/Urinary Tract: Adrenal glands are unremarkable. Kidneys are normal, without renal calculi, focal lesion, or hydronephrosis. Diverticulum of the posterior right wall of the bladder. Stomach/Bowel: Stomach  is within normal limits. Appendix appears normal. Diverticulosis. No evidence of bowel wall thickening, distention, or inflammatory changes. Lymphatic: No enlarged lymph nodes seen in the abdomen or pelvis. Reproductive: Uterus and bilateral adnexa are unremarkable. Other: No intra-abdominal free air or free fluid. Musculoskeletal: No acute or significant osseous findings. IMPRESSION: VASCULAR 1. Active GI bleed involving the distal descending/proximal sigmoid colon. 2.  Aortic Atherosclerosis (ICD10-I70.0). NON-VASCULAR 1. Diverticulosis with no evidence of diverticulitis. 2. Gallstones. Critical Value/emergent results were called by telephone at the time of interpretation on 01/23/2022 at 9:17 am to provider [facility] , who verbally acknowledged these results. Electronically Signed   By: Allegra Lai M.D.   On: 01/23/2022 09:25  No images are attached to the encounter.      Latest Ref Rng & Units 01/29/2022   11:45 AM  CMP  Glucose 70 - 99 mg/dL 92   BUN 6 - 23 mg/dL 9   Creatinine 3.22 - 0.25 mg/dL 4.27   Sodium 062 - 376 mEq/L 136   Potassium 3.5 - 5.1 mEq/L 3.8   Chloride 96 - 112 mEq/L 102   CO2 19 - 32 mEq/L 27   Calcium 8.4 - 10.5 mg/dL 9.4       Latest Ref Rng & Units 02/06/2022   11:10 AM  CBC  WBC 4.0 - 10.5 K/uL 12.0   Hemoglobin 12.0 - 15.0 g/dL 28.3   Hematocrit 15.1 - 46.0 % 35.7   Platelets 150 - 400 K/uL 304      Observation/objective: Appears in no acute distress over video visit today.  Breathing is nonlabored  Assessment and plan: Patient is a 66 year old femaleWith chronic lymphocytosis  Flow cytometry in the past has shown possibly reactive process versus biclonal lymphocytosis.  Her white count fluctuates between 10-12.  It was up to a 16 when she was admitted with diverticular bleed.  Presently it is at 36.  She is not presently anemic and her iron studies were probably checked after she received blood transfusion and therefore not accurate.  She is  presently on oral iron.  We will repeat CBC ferritin and iron studies in 4 months and I will see her back in 8 months with labs  Follow-up instructions: As above  I discussed the assessment and treatment plan with the patient. The patient was provided an opportunity to ask questions and all were answered. The patient agreed with the plan and demonstrated an understanding of the instructions.   The patient was advised to call back or seek an in-person evaluation if the symptoms worsen or if the condition fails to improve as anticipated.  I provided 15 minutes of face-to-face video visit time during this encounter.  Time spent in reviewing recent hospitalizations, lab studies, future follow-up plan  Visit Diagnosis: 1. Lymphocytosis     Dr. Owens Shark, MD, MPH [facility] Tel- [phone] 02/13/2022 12:55 PM

## 2022-02-17 ENCOUNTER — Ambulatory Visit: Admission: RE | Admit: 2022-02-17 | Payer: Medicare HMO | Source: Home / Self Care | Admitting: Gastroenterology

## 2022-02-17 ENCOUNTER — Other Ambulatory Visit: Payer: Self-pay

## 2022-02-17 ENCOUNTER — Encounter: Admission: RE | Payer: Self-pay | Source: Home / Self Care

## 2022-02-17 ENCOUNTER — Telehealth: Payer: Self-pay

## 2022-02-17 DIAGNOSIS — K922 Gastrointestinal hemorrhage, unspecified: Secondary | ICD-10-CM

## 2022-02-17 SURGERY — COLONOSCOPY WITH PROPOFOL
Anesthesia: General

## 2022-02-17 MED ORDER — NA SULFATE-K SULFATE-MG SULF 17.5-3.13-1.6 GM/177ML PO SOLN: 1.0000 | Freq: Once | ORAL | 0 refills | Status: AC

## 2022-02-17 NOTE — Telephone Encounter (Signed)
Voice message received from patient stating that she was unable to have her colonoscopy today due to bowel prep was not received by pharmacy.  I apologized for the inconvenience that this caused for her and informed her that the nurse noted that the rx was sent.  I've sent her rx to the pharmacy for her and rescheduled her colonoscopy with Dr. Allegra Lai for 02/27/22.  Thanks, [place], [place]

## 2022-02-20 ENCOUNTER — Other Ambulatory Visit: Payer: Self-pay | Admitting: Internal Medicine

## 2022-02-20 DIAGNOSIS — B370 Candidal stomatitis: Secondary | ICD-10-CM

## 2022-02-26 ENCOUNTER — Encounter: Payer: Self-pay | Admitting: Gastroenterology

## 2022-02-27 ENCOUNTER — Ambulatory Visit: Payer: Medicare HMO | Admitting: General Practice

## 2022-02-27 ENCOUNTER — Encounter
Admission: RE | Disposition: A | Discharge status: Home / Self Care | Payer: Self-pay | Source: Home / Self Care | Attending: Gastroenterology

## 2022-02-27 ENCOUNTER — Ambulatory Visit
Admission: RE | Admit: 2022-02-27 | Discharge: 2022-02-27 | Disposition: A | Discharge status: Home / Self Care | Payer: Medicare HMO | Attending: Gastroenterology | Admitting: Gastroenterology

## 2022-02-27 ENCOUNTER — Encounter: Payer: Self-pay | Admitting: Gastroenterology

## 2022-02-27 DIAGNOSIS — K922 Gastrointestinal hemorrhage, unspecified: Secondary | ICD-10-CM

## 2022-02-27 DIAGNOSIS — M1990 Unspecified osteoarthritis, unspecified site: Secondary | ICD-10-CM | POA: Insufficient documentation

## 2022-02-27 DIAGNOSIS — J45909 Unspecified asthma, uncomplicated: Secondary | ICD-10-CM | POA: Diagnosis not present

## 2022-02-27 DIAGNOSIS — E785 Hyperlipidemia, unspecified: Secondary | ICD-10-CM | POA: Insufficient documentation

## 2022-02-27 DIAGNOSIS — K625 Hemorrhage of anus and rectum: Secondary | ICD-10-CM | POA: Diagnosis not present

## 2022-02-27 DIAGNOSIS — K5730 Diverticulosis of large intestine without perforation or abscess without bleeding: Secondary | ICD-10-CM

## 2022-02-27 DIAGNOSIS — Z8616 Personal history of COVID-19: Secondary | ICD-10-CM | POA: Diagnosis not present

## 2022-02-27 DIAGNOSIS — K219 Gastro-esophageal reflux disease without esophagitis: Secondary | ICD-10-CM | POA: Insufficient documentation

## 2022-02-27 DIAGNOSIS — I10 Essential (primary) hypertension: Secondary | ICD-10-CM | POA: Insufficient documentation

## 2022-02-27 DIAGNOSIS — K644 Residual hemorrhoidal skin tags: Secondary | ICD-10-CM

## 2022-02-27 HISTORY — PX: COLONOSCOPY WITH PROPOFOL: SHX5780

## 2022-02-27 SURGERY — COLONOSCOPY WITH PROPOFOL
Anesthesia: General

## 2022-02-27 MED ORDER — LIDOCAINE HCL (CARDIAC) PF 100 MG/5ML IV SOSY
PREFILLED_SYRINGE | INTRAVENOUS | Status: DC | PRN
  Administered 2022-02-27: 100 mg via INTRAVENOUS

## 2022-02-27 MED ORDER — SODIUM CHLORIDE 0.9 % IV SOLN
INTRAVENOUS | Status: DC
  Administered 2022-02-27: 20 mL/h via INTRAVENOUS

## 2022-02-27 MED ORDER — PROPOFOL 500 MG/50ML IV EMUL
INTRAVENOUS | Status: DC | PRN
  Administered 2022-02-27: 140 ug/kg/min via INTRAVENOUS

## 2022-02-27 MED ORDER — PROPOFOL 10 MG/ML IV BOLUS
INTRAVENOUS | Status: DC | PRN
  Administered 2022-02-27: 70 mg via INTRAVENOUS

## 2022-02-27 NOTE — Transfer of Care (Signed)
Immediate Anesthesia Transfer of Care Note  Patient: Cheryl Coleman  Procedure(s) Performed: COLONOSCOPY WITH PROPOFOL  Patient Location: PACU and Endoscopy Unit  Anesthesia Type:General  Level of Consciousness: drowsy  Airway & Oxygen Therapy: Patient Spontanous Breathing  Post-op Assessment: Report given to RN  Post vital signs: stable  Last Vitals:  Vitals Value Taken Time  BP    Temp    Pulse    Resp    SpO2      Last Pain:  Vitals:   02/27/22 1032  TempSrc: Temporal  PainSc: 0-No pain         Complications: No notable events documented.

## 2022-02-27 NOTE — Op Note (Signed)
[facility] Gastroenterology Patient Name: Cheryl Coleman Procedure Date: 02/27/2022 10:53 AM MRN: [id number] Account #: [id number] Date of Birth: [birth year] Admit Type: Outpatient Age: 66 Room: [facility] ENDO ROOM 2 Gender: Female Note Status: Finalized Instrument Name: Prentice Docker [id number] Procedure:             Colonoscopy Indications:           Last colonoscopy 10 years ago, Rectal bleeding Providers:             [name] MD, MD Medicines:             General Anesthesia Complications:         No immediate complications. Estimated blood loss: None. Procedure:             Pre-Anesthesia Assessment:                        - Prior to the procedure, a History and Physical was                         performed, and patient medications and allergies were                         reviewed. The patient is competent. The risks and                         benefits of the procedure and the sedation options and                         risks were discussed with the patient. All questions                         were answered and informed consent was obtained.                         Patient identification and proposed procedure were                         verified by the physician, the nurse, the                         anesthesiologist, the anesthetist and the technician                         in the pre-procedure area in the procedure room in the                         endoscopy suite. Mental Status Examination: alert and                         oriented. Airway Examination: normal oropharyngeal                         airway and neck mobility. Respiratory Examination:                         clear to auscultation. CV Examination: normal.                         Prophylactic  Antibiotics: The patient does not require                         prophylactic antibiotics. Prior Anticoagulants: The                         patient has taken no previous anticoagulant or                          antiplatelet agents. ASA Grade Assessment: II - A                         patient with mild systemic disease. After reviewing                         the risks and benefits, the patient was deemed in                         satisfactory condition to undergo the procedure. The                         anesthesia plan was to use general anesthesia.                         Immediately prior to administration of medications,                         the patient was re-assessed for adequacy to receive                         sedatives. The heart rate, respiratory rate, oxygen                         saturations, blood pressure, adequacy of pulmonary                         ventilation, and response to care were monitored                         throughout the procedure. The physical status of the                         patient was re-assessed after the procedure.                        After obtaining informed consent, the colonoscope was                         passed under direct vision. Throughout the procedure,                         the patient's blood pressure, pulse, and oxygen                         saturations were monitored continuously. The                         Colonoscope was introduced through the anus and  advanced to the the cecum, identified by appendiceal                         orifice and ileocecal valve. The colonoscopy was                         performed without difficulty. The patient tolerated                         the procedure well. The quality of the bowel                         preparation was evaluated using the BBPS [facility] Bowel                         Preparation Scale) with scores of: Right Colon = 3,                         Transverse Colon = 3 and Left Colon = 3 (entire mucosa                         seen well with no residual staining, small fragments                         of stool or opaque liquid). The total BBPS  score                         equals 9. Findings:      The perianal and digital rectal examinations were normal. Pertinent       negatives include normal sphincter tone and no palpable rectal lesions.      Multiple small-mouthed diverticula were found in the recto-sigmoid       colon, sigmoid colon and descending colon.      Non-bleeding external hemorrhoids were found during retroflexion. The       hemorrhoids were medium-sized. Impression:            - Diverticulosis in the recto-sigmoid colon, in the                         sigmoid colon and in the descending colon.                        - Non-bleeding external hemorrhoids.                        - No specimens collected. Recommendation:        - Discharge patient to home (with escort).                        - Resume previous diet today.                        - Continue present medications.                        - Repeat colonoscopy in 10 years for screening  purposes.                        - Return to my office PRN. Procedure Code(s):     --- Professional ---                        [phone], Colonoscopy, flexible; diagnostic, including                         collection of specimen(s) by brushing or washing, when                         performed (separate procedure) Diagnosis Code(s):     --- Professional ---                        K64.4, Residual hemorrhoidal skin tags                        K62.5, Hemorrhage of anus and rectum                        K57.30, Diverticulosis of large intestine without                         perforation or abscess without bleeding CPT copyright 2019 American Medical Association. All rights reserved. The codes documented in this report are preliminary and upon coder review may  be revised to meet current compliance requirements. Dr. Libby Maw [name] MD, MD 02/27/2022 11:29:01 AM This report has been signed electronically. Number of Addenda: 0 Note  Initiated On: 02/27/2022 10:53 AM Scope Withdrawal Time: 0 hours 7 minutes 16 seconds  Total Procedure Duration: 0 hours 15 minutes 19 seconds  Estimated Blood Loss:  Estimated blood loss: none.      [facility]

## 2022-02-27 NOTE — Anesthesia Preprocedure Evaluation (Signed)
Anesthesia Evaluation  Patient identified by MRN, date of birth, ID band Patient awake    Reviewed: Allergy & Precautions, NPO status , Patient's Chart, lab work & pertinent test results  History of Anesthesia Complications Negative for: history of anesthetic complications  Airway Mallampati: IV  TM Distance: >3 FB Neck ROM: full    Dental  (+) Teeth Intact   Pulmonary neg shortness of breath, asthma , neg sleep apnea, neg COPD, neg recent URI, Not current smoker,    Pulmonary exam normal        Cardiovascular hypertension, (-) angina(-) Past MI, (-) Cardiac Stents and (-) CABG negative cardio ROS Normal cardiovascular exam     Neuro/Psych PSYCHIATRIC DISORDERS negative neurological ROS     GI/Hepatic Neg liver ROS, GERD  ,  Endo/Other  negative endocrine ROS  Renal/GU negative Renal ROS  negative genitourinary   Musculoskeletal   Abdominal   Peds  Hematology negative hematology ROS (+)   Anesthesia Other Findings Past Medical History: No date: Arthritis No date: Asthma No date: COVID No date: COVID-19     Comment:  07/24/20, 05/19/21 No date: Diverticulitis No date: GERD (gastroesophageal reflux disease) No date: GIB (gastrointestinal bleeding)     Comment:  diverticular 01/23/22-01/24/22 [facility] s/p emobolization & 15               years ago in [place] No date: Hiatal hernia No date: History of blood transfusion No date: History of chicken pox No date: History of GI bleed     Comment:  2013/14 No date: Hyperlipidemia No date: Hypertension No date: Leukocytosis No date: Type O blood, Rh positive  Past Surgical History: No date: BREAST CYST ASPIRATION; Right No date: CESAREAN SECTION     Comment:  04/27/84 No date: COLONOSCOPY 01/23/2022: EMBOLIZATION; N/A     Comment:  Procedure: EMBOLIZATION;  Surgeon: [name],               MD;  Location: [facility] INVASIVE CV LAB;  Service:               Cardiovascular;   Laterality: N/A; 08/29/2020: ESOPHAGOGASTRODUODENOSCOPY (EGD) WITH PROPOFOL; N/A     Comment:  Procedure: ESOPHAGOGASTRODUODENOSCOPY (EGD) WITH               PROPOFOL;  Surgeon: [name], MD;  Location:               [facility] ENDOSCOPY;  Service: Gastroenterology;  Laterality:               N/A; No date: ORIF ACETABULAR FRACTURE  BMI    Body Mass Index: 34.33 kg/m      Reproductive/Obstetrics negative OB ROS                             Anesthesia Physical Anesthesia Plan  ASA: 2  Anesthesia Plan: General   Post-op Pain Management: Minimal or no pain anticipated   Induction: Intravenous  PONV Risk Score and Plan: 3 and Propofol infusion, TIVA and Ondansetron  Airway Management Planned: Nasal Cannula  Additional Equipment: None  Intra-op Plan:   Post-operative Plan:   Informed Consent: I have reviewed the patients History and Physical, chart, labs and discussed the procedure including the risks, benefits and alternatives for the proposed anesthesia with the patient or authorized representative who has indicated his/her understanding and acceptance.     Dental advisory given  Plan Discussed with: CRNA and  Surgeon  Anesthesia Plan Comments: (Discussed risks of anesthesia with patient, including possibility of difficulty with spontaneous ventilation under anesthesia necessitating airway intervention, PONV, and rare risks such as cardiac or respiratory or neurological events, and allergic reactions. Discussed the role of CRNA in patient's perioperative care. Patient understands.)        Anesthesia Quick Evaluation

## 2022-02-27 NOTE — H&P (Signed)
Cheryl Repress, MD [address]  [address]  [place], [place] [address]  Main: [phone]  Fax: [phone] Pager: [phone]  Primary Care Physician:  [name], [name], MD Primary Gastroenterologist:  Dr. Arlyss Coleman  Pre-Procedure History & Physical: HPI:  Cheryl Coleman is a 66 y.o. female is here for an colonoscopy.   Past Medical History:  Diagnosis Date   Arthritis    Asthma    COVID    COVID-19    07/24/20, 05/19/21   Diverticulitis    GERD (gastroesophageal reflux disease)    GIB (gastrointestinal bleeding)    diverticular 01/23/22-01/24/22 [facility] s/p emobolization & 15 years ago in [place]   Hiatal hernia    History of blood transfusion    History of chicken pox    History of GI bleed    2013/14   Hyperlipidemia    Hypertension    Leukocytosis    Type O blood, Rh positive     Past Surgical History:  Procedure Laterality Date   BREAST CYST ASPIRATION Right    CESAREAN SECTION     04/27/84   COLONOSCOPY     EMBOLIZATION N/A 01/23/2022   Procedure: EMBOLIZATION;  Surgeon: [name], MD;  Location: [facility] INVASIVE CV LAB;  Service: Cardiovascular;  Laterality: N/A;   ESOPHAGOGASTRODUODENOSCOPY (EGD) WITH PROPOFOL N/A 08/29/2020   Procedure: ESOPHAGOGASTRODUODENOSCOPY (EGD) WITH PROPOFOL;  Surgeon: [name], MD;  Location: [facility] ENDOSCOPY;  Service: Gastroenterology;  Laterality: N/A;   ORIF ACETABULAR FRACTURE      Prior to Admission medications   Medication Sig Start Date End Date Taking? Authorizing Provider  Acetaminophen (TYLENOL PO) Take by mouth.   Yes [provider]  albuterol (VENTOLIN HFA) 108 (90 Base) MCG/ACT inhaler INHALE 1-2 PUFFS BY MOUTH EVERY 6 (SIX) HOURS AS NEEDED 01/14/22  Yes [name], [name], MD  Ascorbic Acid (VITAMIN C PO) Take by mouth daily.   Yes [provider]  atorvastatin (LIPITOR) 10 MG tablet TAKE 1 TABLET (10 MG TOTAL) BY MOUTH DAILY AT 6 PM. 09/04/21  Yes [name], [name],  MD  CALCIUM PO Take by mouth daily.   Yes [provider]  citalopram (CELEXA) 10 MG tablet Take 1 tablet (10 mg total) by mouth daily. 10/14/21  Yes [name], [name], MD  diltiazem (CARDIZEM CD) 120 MG 24 hr capsule Take 1 capsule (120 mg total) by mouth daily. qhs 01/14/22  Yes [name], [name], MD  diltiazem (CARDIZEM CD) 240 MG 24 hr capsule Take 1 capsule (240 mg total) by mouth daily. In am 01/14/22  Yes [name], [name], MD  fluticasone (FLONASE) 50 MCG/ACT nasal spray SPRAY 2 SPRAYS INTO EACH NOSTRIL EVERY DAY 09/09/21  Yes [name], [name], MD  Loratadine (CLARITIN PO) Take by mouth as needed.   Yes [provider]  montelukast (SINGULAIR) 10 MG tablet TAKE 1 TABLET BY MOUTH EVERYDAY AT BEDTIME 09/15/21  Yes [name], [name], MD  Multiple Vitamins-Minerals (ZINC PO) Take by mouth daily.   Yes [provider]  nystatin (MYCOSTATIN) 100000 UNIT/ML suspension TAKE 5 MLS (500,000 UNITS TOTAL) BY MOUTH 4 (FOUR) TIMES DAILY. SWISH, GARGLE AND SWALLOW 02/20/22  Yes [name], [name], MD  olmesartan (BENICAR) 5 MG tablet Take 2 tablets (10 mg total) by mouth daily. 11/10/21  Yes [name], [name], MD  omeprazole (PRILOSEC) 40 MG capsule TAKE 1 CAPSULE BY MOUTH TWICE A DAY 02/08/22  Yes [name], FNP  sodium chloride (OCEAN) 0.65 %  SOLN nasal spray Place 2 sprays into both nostrils as needed for congestion. Before flonase 01/14/22  Yes [name], [name], MD  Specialty Vitamins Products (WOMENS VITA PAK PO) Take by mouth daily.   Yes [provider]  VITAMIN D PO Take by mouth daily.   Yes [provider]    Allergies as of 02/17/2022 - Review Complete 02/13/2022  Allergen Reaction Noted   Oxycodone Other (See Comments) 08/18/2018   Dilaudid [hydromorphone]  01/29/2022    Family History  Problem Relation Age of Onset   Hypertension Maternal Aunt    Diabetes Maternal Aunt    Breast cancer  Neg Hx     Social History   Socioeconomic History   Marital status: Married    Spouse name: Not on file   Number of children: Not on file   Years of education: Not on file   Highest education level: Not on file  Occupational History   Not on file  Tobacco Use   Smoking status: Never   Smokeless tobacco: Never  Vaping Use   Vaping Use: Never used  Substance and Sexual Activity   Alcohol use: Not Currently   Drug use: Not Currently   Sexual activity: Yes    Comment: husband   Other Topics Concern   Not on file  Social History Narrative   Lived in [place] from [place]    Married 1st husband died in late 90s/2000s   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, wears seat belt, safe in relationship    Social Determinants of Health   Financial Resource Strain: Not on file  Food Insecurity: No Food Insecurity (02/12/2022)   Hunger Vital Sign    Worried About Running Out of Food in the Last Year: Never true    Ran Out of Food in the Last Year: Never true  Transportation Needs: Not on file  Physical Activity: Not on file  Stress: Not on file  Social Connections: Not on file  Intimate Partner Violence: Not At Risk (02/12/2022)   Humiliation, Afraid, Rape, and Kick questionnaire    Fear of Current or Ex-Partner: No    Emotionally Abused: No    Physically Abused: No    Sexually Abused: No    Review of Systems: See HPI, otherwise negative ROS  Physical Exam: BP (!) 147/95   Pulse 99   Temp (!) 96.7 F (35.9 C) (Temporal)   Resp 20   Ht 5\' 4"  (1.626 m)   Wt 90.7 kg   SpO2 100%   BMI 34.33 kg/m  General:   Alert,  pleasant and cooperative in NAD Head:  Normocephalic and atraumatic. Neck:  Supple; no masses or thyromegaly. Lungs:  Clear throughout to auscultation.    Heart:  Regular rate and rhythm. Abdomen:  Soft, nontender and nondistended. Normal bowel sounds, without guarding, and without rebound.   Neurologic:  Alert and  oriented x4;  grossly normal  neurologically.  Impression/Plan: Adasyn Mcadams is here for an colonoscopy to be performed for rectal bleeding  Risks, benefits, limitations, and alternatives regarding  colonoscopy have been reviewed with the patient.  Questions have been answered.  All parties agreeable.   Laurina Bustle, MD  02/27/2022, 10:48 AM

## 2022-02-28 NOTE — Anesthesia Postprocedure Evaluation (Signed)
Anesthesia Post Note  Patient: Careers adviser  Procedure(s) Performed: COLONOSCOPY WITH PROPOFOL  Patient location during evaluation: Endoscopy Anesthesia Type: General Level of consciousness: awake and alert Pain management: pain level controlled Vital Signs Assessment: post-procedure vital signs reviewed and stable Respiratory status: spontaneous breathing, nonlabored ventilation, respiratory function stable and patient connected to nasal cannula oxygen Cardiovascular status: blood pressure returned to baseline and stable Postop Assessment: no apparent nausea or vomiting Anesthetic complications: no   No notable events documented.   Last Vitals:  Vitals:   02/27/22 1127 02/27/22 1143  BP: (!) 106/58 120/76  Pulse: 87 88  Resp: 17 15  Temp: 36.6 C (!) 36.4 C  SpO2: 96% 100%    Last Pain:  Vitals:   02/27/22 1143  TempSrc:   PainSc: 0-No pain                 Stephanie Coup

## 2022-03-11 NOTE — Progress Notes (Signed)
MRN : [id number]  Cheryl Coleman is a 66 y.o. ([birth year]) female who presents with chief complaint of past GI bleed.  History of Present Illness:   The patient returns to the office for followup and review status post angiogram with intervention on 01/23/2022.   Procedure: Microbead embolization of the left colic and to associated sigmoid arteries with a total of 2 cc of 500-700  polyvinyl alcohol beads.  The patient notes improvement with cessation of her GI bleed.  She has followed up with GI and has had a follow up colonoscopy which was reported as normal.  There have been no significant changes to the patient's overall health care.  No documented history of amaurosis fugax or recent TIA symptoms. There are no recent neurological changes noted. No documented history of DVT, PE or superficial thrombophlebitis. The patient denies recent episodes of angina or shortness of breath.   No outpatient medications have been marked as taking for the 03/12/22 encounter (Appointment) with Gilda Crease, Latina Craver, MD.    Past Medical History:  Diagnosis Date   Arthritis    Asthma    COVID    COVID-19    07/24/20, 05/19/21   Diverticulitis    GERD (gastroesophageal reflux disease)    GIB (gastrointestinal bleeding)    diverticular 01/23/22-01/24/22 [facility] s/p emobolization & 15 years ago in [place]   Hiatal hernia    History of blood transfusion    History of chicken pox    History of GI bleed    2013/14   Hyperlipidemia    Hypertension    Leukocytosis    Type O blood, Rh positive     Past Surgical History:  Procedure Laterality Date   BREAST CYST ASPIRATION Right    CESAREAN SECTION     04/27/84   COLONOSCOPY     COLONOSCOPY WITH PROPOFOL N/A 02/27/2022   Procedure: COLONOSCOPY WITH PROPOFOL;  Surgeon: [name], MD;  Location: [facility] ENDOSCOPY;  Service: Gastroenterology;  Laterality: N/A;   EMBOLIZATION N/A 01/23/2022   Procedure: EMBOLIZATION;  Surgeon: [name], MD;   Location: [facility] INVASIVE CV LAB;  Service: Cardiovascular;  Laterality: N/A;   ESOPHAGOGASTRODUODENOSCOPY (EGD) WITH PROPOFOL N/A 08/29/2020   Procedure: ESOPHAGOGASTRODUODENOSCOPY (EGD) WITH PROPOFOL;  Surgeon: [name], MD;  Location: [facility] ENDOSCOPY;  Service: Gastroenterology;  Laterality: N/A;   ORIF ACETABULAR FRACTURE      Social History Social History   Tobacco Use   Smoking status: Never   Smokeless tobacco: Never  Vaping Use   Vaping Use: Never used  Substance Use Topics   Alcohol use: Not Currently   Drug use: Not Currently    Family History Family History  Problem Relation Age of Onset   Hypertension Maternal Aunt    Diabetes Maternal Aunt    Breast cancer Neg Hx     Allergies  Allergen Reactions   Oxycodone Other (See Comments)    Feels bad   Dilaudid [Hydromorphone]     Too drowsy     REVIEW OF SYSTEMS (Negative unless checked)  Constitutional: [] Weight loss  [] Fever  [] Chills Cardiac: [] Chest pain   [] Chest pressure   [] Palpitations   [] Shortness of breath when laying flat   [] Shortness of breath with exertion. Vascular:  [] Pain in legs with walking   [x] Pain in legs at rest  [] History of DVT   [] Phlebitis   [x] Swelling in legs   [] Varicose veins   [] Non-healing ulcers Pulmonary:   []   Uses home oxygen   [] Productive cough   [] Hemoptysis   [] Wheeze  [] COPD   [] Asthma Neurologic:  [] Dizziness   [] Seizures   [] History of stroke   [] History of TIA  [] Aphasia   [] Vissual changes   [] Weakness or numbness in arm   [] Weakness or numbness in leg Musculoskeletal:   [] Joint swelling   [] Joint pain   [] Low back pain Hematologic:  [] Easy bruising  [] Easy bleeding   [] Hypercoagulable state   [] Anemic Gastrointestinal:  [] Diarrhea   [] Vomiting  [] Gastroesophageal reflux/heartburn   [] Difficulty swallowing. Genitourinary:  [] Chronic kidney disease   [] Difficult urination  [] Frequent urination   [] Blood in urine Skin:  [] Rashes   [] Ulcers  Psychological:   [] History of anxiety   []  History of major depression.  Physical Examination  There were no vitals filed for this visit. There is no height or weight on file to calculate BMI. Gen: WD/WN, NAD Head: [place]/AT, No temporalis wasting.  Ear/Nose/Throat: Hearing grossly intact, nares w/o erythema or drainage, pinna without lesions Eyes: PER, EOMI, sclera nonicteric.  Neck: Supple, no gross masses.  No JVD.  Pulmonary:  Good air movement, no audible wheezing, no use of accessory muscles.  Cardiac: RRR, precordium not hyperdynamic. Vascular:  scattered varicosities present bilaterally.  Mild venous stasis changes to the legs bilaterally.   Vessel Right Left  Radial Palpable Palpable  Gastrointestinal: soft, non-distended. No guarding/no peritoneal signs.  Musculoskeletal: M/S 5/5 throughout.  No deformity.  Neurologic: CN 2-12 intact. Pain and light touch intact in extremities.  Symmetrical.  Speech is fluent. Motor exam as listed above. Psychiatric: Judgment intact, Mood & affect appropriate for pt's clinical situation. Dermatologic: Venous rashes no ulcers noted.  No changes consistent with cellulitis. Lymph : No lichenification or skin changes of chronic lymphedema.  CBC Lab Results  Component Value Date   WBC 12.0 (H) 02/06/2022   HGB 12.5 02/06/2022   HCT 35.7 (L) 02/06/2022   MCV 80.6 02/06/2022   PLT 304 02/06/2022    BMET    Component Value Date/Time   NA 136 01/29/2022 1145   K 3.8 01/29/2022 1145   CL 102 01/29/2022 1145   CO2 27 01/29/2022 1145   GLUCOSE 92 01/29/2022 1145   BUN 9 01/29/2022 1145   CREATININE 0.94 01/29/2022 1145   CALCIUM 9.4 01/29/2022 1145   GFRNONAA >60 01/24/2022 1029   CrCl cannot be calculated (Patient's most recent lab result is older than the maximum 21 days allowed.).  COAG No results found for: "INR", "PROTIME"  Radiology No results found.   Assessment/Plan 1. Lower GI bleed She is s/p embolization with successful cessation of  bleeding.  She will follow up PRN  2. Gastroesophageal reflux disease with esophagitis, unspecified whether hemorrhage Continue PPI as already ordered, this medication has been reviewed and there are no changes at this time.  Avoidence of caffeine and alcohol  Moderate elevation of the head of the bed    3. Essential hypertension Continue antihypertensive medications as already ordered, these medications have been reviewed and there are no changes at this time.   4. Mild intermittent asthma, unspecified whether complicated Continue pulmonary medications and aerosols as already ordered, these medications have been reviewed and there are no changes at this time.    5. Hyperlipidemia, unspecified hyperlipidemia type Continue statin as ordered and reviewed, no changes at this time     , MD  03/11/2022 8:02 AM

## 2022-03-12 ENCOUNTER — Encounter (INDEPENDENT_AMBULATORY_CARE_PROVIDER_SITE_OTHER): Payer: Self-pay | Admitting: Vascular Surgery

## 2022-03-12 ENCOUNTER — Ambulatory Visit (INDEPENDENT_AMBULATORY_CARE_PROVIDER_SITE_OTHER): Payer: Medicare HMO | Admitting: Vascular Surgery

## 2022-03-12 VITALS — BP 125/74 | HR 85 | Resp 16 | Wt 225.4 lb

## 2022-03-12 DIAGNOSIS — K2100 Gastro-esophageal reflux disease with esophagitis, without bleeding: Secondary | ICD-10-CM

## 2022-03-12 DIAGNOSIS — E785 Hyperlipidemia, unspecified: Secondary | ICD-10-CM | POA: Diagnosis not present

## 2022-03-12 DIAGNOSIS — I10 Essential (primary) hypertension: Secondary | ICD-10-CM | POA: Diagnosis not present

## 2022-03-12 DIAGNOSIS — K922 Gastrointestinal hemorrhage, unspecified: Secondary | ICD-10-CM

## 2022-03-12 DIAGNOSIS — J4520 Mild intermittent asthma, uncomplicated: Secondary | ICD-10-CM

## 2022-03-12 DIAGNOSIS — H2513 Age-related nuclear cataract, bilateral: Secondary | ICD-10-CM | POA: Diagnosis not present

## 2022-03-15 ENCOUNTER — Encounter (INDEPENDENT_AMBULATORY_CARE_PROVIDER_SITE_OTHER): Payer: Self-pay | Admitting: Vascular Surgery

## 2022-03-25 ENCOUNTER — Ambulatory Visit: Payer: Medicare HMO | Admitting: Podiatry

## 2022-03-25 DIAGNOSIS — B351 Tinea unguium: Secondary | ICD-10-CM | POA: Diagnosis not present

## 2022-03-25 DIAGNOSIS — B353 Tinea pedis: Secondary | ICD-10-CM

## 2022-03-25 MED ORDER — TERBINAFINE HCL 250 MG PO TABS: 250.0000 mg | ORAL_TABLET | Freq: Every day | ORAL | 0 refills | Status: AC

## 2022-03-25 MED ORDER — CLOTRIMAZOLE-BETAMETHASONE 1-0.05 % EX CREA: 1.0000 | TOPICAL_CREAM | Freq: Two times a day (BID) | CUTANEOUS | 2 refills | Status: DC

## 2022-03-25 NOTE — Progress Notes (Signed)
  Subjective:  Patient ID: Cheryl Coleman, female    DOB: [birth year],  MRN: [id number]  Chief Complaint  Patient presents with   Nail Problem     np toenail fungus on right foot great toe   Tinea Pedis    Bilateral foot itching and peeling. She has tried OTC topicals with no relief    66 y.o. female presents with the above complaint. History confirmed with patient.  Thinks the nail fungus started initially after a pedicure and that the skin developed after this.  She has very thick skin on the bottom of her foot as well  Objective:  Physical Exam: warm, good capillary refill, no trophic changes or ulcerative lesions, normal DP and PT pulses, normal sensory exam, and onychomycosis of the right hallux, she has diffuse tinea pedis with dry scaling skin in a box and description that is itchy little to no interdigital maceration, diffuse hyperkeratosis plantar forefoot bilateral, right worse than left.     Assessment:   1. Onychomycosis   2. Tinea pedis of both feet      Plan:  Patient was evaluated and treated and all questions answered.  Discussed the etiology and treatment options for tinea pedis and onychomycosis.  Discussed topical and oral treatment.  Recommended dual therapy oral with Lamisil and topical treatment with Lotrisone cream.  This was sent to the patient's pharmacy.  Also discussed appropriate foot hygiene, use of antifungal spray such as Tinactin in shoes, as well as cleaning her foot surfaces such as showers and bathroom floors with bleach.  Discussed possible side effects of Lamisil.  Her LFTs in July were normal which I reviewed.  We will recheck LFTs after next visit.  We discussed possibility of myalgia while she is on Lamisil and atorvastatin and she will watch out for this.  I will see her back in 4 months for follow-up.  Photographs were taken.   Return in about 4 months (around 07/25/2022) for follow up after nail fungus treatment.

## 2022-04-03 ENCOUNTER — Inpatient Hospital Stay
Admission: EM | Admit: 2022-04-03 | Discharge: 2022-04-09 | DRG: 356 | Disposition: A | Discharge status: Home / Self Care | Payer: Medicare HMO | Attending: Internal Medicine | Admitting: Internal Medicine

## 2022-04-03 ENCOUNTER — Encounter: Payer: Self-pay | Admitting: Emergency Medicine

## 2022-04-03 ENCOUNTER — Other Ambulatory Visit: Payer: Self-pay

## 2022-04-03 DIAGNOSIS — K922 Gastrointestinal hemorrhage, unspecified: Secondary | ICD-10-CM

## 2022-04-03 DIAGNOSIS — K219 Gastro-esophageal reflux disease without esophagitis: Secondary | ICD-10-CM | POA: Diagnosis present

## 2022-04-03 DIAGNOSIS — Z743 Need for continuous supervision: Secondary | ICD-10-CM | POA: Diagnosis not present

## 2022-04-03 DIAGNOSIS — K625 Hemorrhage of anus and rectum: Secondary | ICD-10-CM

## 2022-04-03 DIAGNOSIS — E876 Hypokalemia: Secondary | ICD-10-CM | POA: Diagnosis present

## 2022-04-03 DIAGNOSIS — Z79899 Other long term (current) drug therapy: Secondary | ICD-10-CM

## 2022-04-03 DIAGNOSIS — E663 Overweight: Secondary | ICD-10-CM | POA: Diagnosis present

## 2022-04-03 DIAGNOSIS — Z23 Encounter for immunization: Secondary | ICD-10-CM

## 2022-04-03 DIAGNOSIS — R571 Hypovolemic shock: Secondary | ICD-10-CM

## 2022-04-03 DIAGNOSIS — K921 Melena: Secondary | ICD-10-CM | POA: Diagnosis not present

## 2022-04-03 DIAGNOSIS — Z8616 Personal history of COVID-19: Secondary | ICD-10-CM

## 2022-04-03 DIAGNOSIS — Z885 Allergy status to narcotic agent status: Secondary | ICD-10-CM

## 2022-04-03 DIAGNOSIS — I10 Essential (primary) hypertension: Secondary | ICD-10-CM | POA: Diagnosis present

## 2022-04-03 DIAGNOSIS — K5909 Other constipation: Secondary | ICD-10-CM | POA: Diagnosis present

## 2022-04-03 DIAGNOSIS — Z6837 Body mass index (BMI) 37.0-37.9, adult: Secondary | ICD-10-CM

## 2022-04-03 DIAGNOSIS — K5731 Diverticulosis of large intestine without perforation or abscess with bleeding: Principal | ICD-10-CM | POA: Diagnosis present

## 2022-04-03 DIAGNOSIS — M1990 Unspecified osteoarthritis, unspecified site: Secondary | ICD-10-CM | POA: Diagnosis present

## 2022-04-03 DIAGNOSIS — J45909 Unspecified asthma, uncomplicated: Secondary | ICD-10-CM | POA: Diagnosis present

## 2022-04-03 DIAGNOSIS — D72829 Elevated white blood cell count, unspecified: Secondary | ICD-10-CM | POA: Diagnosis not present

## 2022-04-03 DIAGNOSIS — E785 Hyperlipidemia, unspecified: Secondary | ICD-10-CM | POA: Diagnosis present

## 2022-04-03 DIAGNOSIS — Z8249 Family history of ischemic heart disease and other diseases of the circulatory system: Secondary | ICD-10-CM

## 2022-04-03 DIAGNOSIS — D62 Acute posthemorrhagic anemia: Secondary | ICD-10-CM | POA: Diagnosis present

## 2022-04-03 LAB — COMPREHENSIVE METABOLIC PANEL WITH GFR
ALT: 18 U/L (ref 0–44)
AST: 19 U/L (ref 15–41)
Albumin: 3.9 g/dL (ref 3.5–5.0)
Alkaline Phosphatase: 62 U/L (ref 38–126)
Anion gap: 8 (ref 5–15)
BUN: 10 mg/dL (ref 8–23)
CO2: 27 mmol/L (ref 22–32)
Calcium: 9.2 mg/dL (ref 8.9–10.3)
Chloride: 105 mmol/L (ref 98–111)
Creatinine, Ser: 0.84 mg/dL (ref 0.44–1.00)
GFR, Estimated: >60 mL/min
Glucose, Bld: 109 mg/dL — ABNORMAL HIGH (ref 70–99)
Potassium: 3.7 mmol/L (ref 3.5–5.1)
Sodium: 140 mmol/L (ref 135–145)
Total Bilirubin: 0.6 mg/dL (ref 0.3–1.2)
Total Protein: 8 g/dL (ref 6.5–8.1)

## 2022-04-03 LAB — CBC
HCT: 35.8 % — ABNORMAL LOW (ref 36.0–46.0)
Hemoglobin: 12.3 g/dL (ref 12.0–15.0)
MCH: 26.4 pg (ref 26.0–34.0)
MCHC: 34.4 g/dL (ref 30.0–36.0)
MCV: 76.8 fL — ABNORMAL LOW (ref 80.0–100.0)
Platelets: 258 K/uL (ref 150–400)
RBC: 4.66 MIL/uL (ref 3.87–5.11)
RDW: 14.6 % (ref 11.5–15.5)
WBC: 9.1 K/uL (ref 4.0–10.5)
nRBC: 0 % (ref 0.0–0.2)

## 2022-04-03 LAB — HEMOGLOBIN AND HEMATOCRIT, BLOOD
HCT: 32 % — ABNORMAL LOW (ref 36.0–46.0)
HCT: 30.2 % — ABNORMAL LOW (ref 36.0–46.0)
Hemoglobin: 11 g/dL — ABNORMAL LOW (ref 12.0–15.0)
Hemoglobin: 10.4 g/dL — ABNORMAL LOW (ref 12.0–15.0)

## 2022-04-03 MED ORDER — FLUTICASONE PROPIONATE 50 MCG/ACT NA SUSP: 2.0000 | Freq: Every day | NASAL | Status: DC | PRN

## 2022-04-03 MED ORDER — PANTOPRAZOLE SODIUM 40 MG PO TBEC: 80.0000 mg | DELAYED_RELEASE_TABLET | Freq: Every day | ORAL | Status: DC

## 2022-04-03 MED ORDER — ATORVASTATIN CALCIUM 10 MG PO TABS
10.0000 mg | ORAL_TABLET | Freq: Every day | ORAL | Status: DC
  Administered 2022-04-03 – 2022-04-08 (×5): 10 mg via ORAL
  Filled 2022-04-03 (×5): qty 1

## 2022-04-03 MED ORDER — SODIUM CHLORIDE 0.9 % IV SOLN: 250.0000 mL | INTRAVENOUS | Status: DC | PRN

## 2022-04-03 MED ORDER — ALBUTEROL SULFATE (2.5 MG/3ML) 0.083% IN NEBU: 2.5000 mg | INHALATION_SOLUTION | Freq: Four times a day (QID) | RESPIRATORY_TRACT | Status: DC | PRN

## 2022-04-03 MED ORDER — SODIUM CHLORIDE 0.9% FLUSH
3.0000 mL | Freq: Two times a day (BID) | INTRAVENOUS | Status: DC
  Administered 2022-04-03 – 2022-04-09 (×12): 3 mL via INTRAVENOUS

## 2022-04-03 MED ORDER — MONTELUKAST SODIUM 10 MG PO TABS
10.0000 mg | ORAL_TABLET | Freq: Every day | ORAL | Status: DC
  Administered 2022-04-05 – 2022-04-09 (×4): 10 mg via ORAL
  Filled 2022-04-03 (×4): qty 1

## 2022-04-03 MED ORDER — CITALOPRAM HYDROBROMIDE 20 MG PO TABS
10.0000 mg | ORAL_TABLET | Freq: Every day | ORAL | Status: DC
  Administered 2022-04-05 – 2022-04-09 (×5): 10 mg via ORAL
  Filled 2022-04-03 (×5): qty 1

## 2022-04-03 MED ORDER — TERBINAFINE HCL 250 MG PO TABS
250.0000 mg | ORAL_TABLET | Freq: Every day | ORAL | Status: DC
  Administered 2022-04-03 – 2022-04-09 (×6): 250 mg via ORAL
  Filled 2022-04-03 (×8): qty 1

## 2022-04-03 MED ORDER — LACTATED RINGERS IV SOLN: INTRAVENOUS | Status: DC

## 2022-04-03 MED ORDER — DILTIAZEM HCL ER COATED BEADS 240 MG PO CP24: 240.0000 mg | ORAL_CAPSULE | Freq: Every day | ORAL | Status: DC

## 2022-04-03 MED ORDER — SODIUM CHLORIDE 0.9% FLUSH: 3.0000 mL | INTRAVENOUS | Status: DC | PRN

## 2022-04-03 MED ORDER — ACETAMINOPHEN 325 MG PO TABS
650.0000 mg | ORAL_TABLET | Freq: Four times a day (QID) | ORAL | Status: DC | PRN
  Administered 2022-04-05 (×2): 650 mg via ORAL
  Filled 2022-04-03 (×2): qty 2

## 2022-04-03 MED ORDER — ACETAMINOPHEN 650 MG RE SUPP: 650.0000 mg | Freq: Four times a day (QID) | RECTAL | Status: DC | PRN

## 2022-04-03 MED ORDER — TRAZODONE HCL 50 MG PO TABS: 25.0000 mg | ORAL_TABLET | Freq: Every evening | ORAL | Status: DC | PRN

## 2022-04-03 NOTE — H&P (Signed)
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 She underwent angriography and microbead embolization of the colic and sigmoid arteries 12/20/85. Her last colonoscopy was 02/27/22 revealing extensive diverticulosis w/o other findings. She reports that this AM she had a blood BM. She has been asymptomatic. Due to the rectal bleed she presents to [facility] for evaluation.    ED Course: afebrile, VSS. EDP exam - dark blood on rectal exam, heme positive.  Lab Hgb 12  Review of Systems:  Review of Systems  Constitutional: Negative.   HENT: Negative.    Respiratory: Negative.    Cardiovascular: Negative.   Gastrointestinal:  Positive for blood in stool.  Genitourinary: Negative.   Musculoskeletal: Negative.   Skin: Negative.   Neurological: Negative.   Endo/Heme/Allergies: Negative.   Psychiatric/Behavioral: Negative.      Past Medical History:  Diagnosis Date   Arthritis    Asthma    COVID    COVID-19    07/24/20, 05/19/21   Diverticulitis    GERD (gastroesophageal reflux disease)    GIB (gastrointestinal bleeding)    diverticular 01/23/22-01/24/22 [facility] s/p emobolization & 15 years ago in [place]   Hiatal hernia    History of blood transfusion    History of chicken pox    History of GI bleed    2013/14   Hyperlipidemia    Hypertension    Leukocytosis    Type O blood, Rh positive     Past Surgical History:  Procedure Laterality Date   BREAST CYST ASPIRATION Right    CESAREAN SECTION     04/27/84   COLONOSCOPY     COLONOSCOPY WITH PROPOFOL N/A 02/27/2022   Procedure: COLONOSCOPY WITH PROPOFOL;  Surgeon: Cheryl Reil, MD;  Location: [facility] ENDOSCOPY;  Service:  Gastroenterology;  Laterality: N/A;   EMBOLIZATION N/A 01/23/2022   Procedure: EMBOLIZATION;  Surgeon: Cheryl Dills, MD;  Location: [facility] INVASIVE CV LAB;  Service: Cardiovascular;  Laterality: N/A;   ESOPHAGOGASTRODUODENOSCOPY (EGD) WITH PROPOFOL N/A 08/29/2020   Procedure: ESOPHAGOGASTRODUODENOSCOPY (EGD) WITH PROPOFOL;  Surgeon: Cheryl Reil, MD;  Location: [facility] ENDOSCOPY;  Service: Gastroenterology;  Laterality: N/A;   ORIF ACETABULAR FRACTURE      Soc Hx - married #2, widowed #1. 2 sons, blended children 6, grands 16, great-grands 2. 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 She has never used smokeless tobacco. She reports that she does not currently use alcohol. She reports that she does not currently use drugs.  Allergies  Allergen Reactions   Oxycodone Other (See Comments)    Feels bad   Dilaudid [Hydromorphone] Other (See Comments)    Too drowsy    Family History  Problem Relation Age of Onset   Hypertension Maternal Aunt    Diabetes Maternal Aunt    Breast cancer Neg Hx     Prior to Admission medications   Medication Sig Start Date End Date Taking? Authorizing Provider  atorvastatin (LIPITOR) 10 MG tablet TAKE 1 TABLET (10 MG TOTAL) BY MOUTH DAILY AT 6 PM. 09/04/21  Yes Cheryl Coleman, Cheryl Spillers, MD  citalopram (CELEXA) 10 MG tablet Take 1 tablet (10 mg total) by mouth daily. 10/14/21  Yes Cheryl Coleman,  Cheryl Spillers, MD  clotrimazole-betamethasone (LOTRISONE) cream Apply 1 Application topically 2 (two) times daily. 03/25/22  Yes Cheryl Coleman, Cheryl Coleman, DPM  diltiazem (CARDIZEM CD) 120 MG 24 hr capsule Take 1 capsule (120 mg total) by mouth daily. qhs 01/14/22  Yes Cheryl Coleman, Cheryl Spillers, MD  diltiazem (CARDIZEM CD) 240 MG 24 hr capsule Take 1 capsule (240 mg total) by mouth daily. In am 01/14/22  Yes Cheryl Coleman, Cheryl Spillers, MD  montelukast (SINGULAIR) 10 MG tablet TAKE 1 TABLET BY MOUTH EVERYDAY AT BEDTIME Patient taking differently: Take 10 mg by mouth at  bedtime. 09/15/21  Yes Cheryl Coleman, Cheryl Spillers, MD  omeprazole (PRILOSEC) 40 MG capsule TAKE 1 CAPSULE BY MOUTH TWICE A DAY 02/08/22  Yes Cheryl Foster, FNP  Specialty Vitamins Products (WOMENS VITA PAK PO) Take by mouth daily.   Yes [provider]  terbinafine (LAMISIL) 250 MG tablet Take 1 tablet (250 mg total) by mouth daily. 03/25/22 06/23/22 Yes Cheryl Coleman, Cheryl Coleman, DPM  albuterol (VENTOLIN HFA) 108 (90 Base) MCG/ACT inhaler INHALE 1-2 PUFFS BY MOUTH EVERY 6 (SIX) HOURS AS NEEDED 01/14/22   Cheryl Coleman, Cheryl Spillers, MD  fluticasone (FLONASE) 50 MCG/ACT nasal spray SPRAY 2 SPRAYS INTO EACH NOSTRIL EVERY DAY Patient taking differently: Place 2 sprays into both nostrils daily as needed for allergies or rhinitis. 09/09/21   Cheryl Coleman, Cheryl Spillers, MD    Physical Exam: Vitals:   04/03/22 0931 04/03/22 0935 04/03/22 1100 04/03/22 1130  BP:  136/74 129/76 122/65  Pulse:  86 84 86  Resp:  19 (!) 22 19  Temp: 98.2 F (36.8 C)     TempSrc: Oral     SpO2:  96% 99% 96%  Weight:      Height:        Physical Exam Constitutional:      General: She is not in acute distress.    Appearance: Normal appearance. She is not ill-appearing.     Comments: overweight  HENT:     Head: Normocephalic and atraumatic.     Mouth/Throat:     Mouth: Mucous membranes are moist.  Eyes:     Extraocular Movements: Extraocular movements intact.     Conjunctiva/sclera: Conjunctivae normal.     Pupils: Pupils are equal, round, and reactive to light.  Cardiovascular:     Rate and Rhythm: Normal rate and regular rhythm.     Pulses: Normal pulses.     Heart sounds: Normal heart sounds.  Pulmonary:     Effort: Pulmonary effort is normal.     Breath sounds: Normal breath sounds.  Abdominal:     General: Bowel sounds are normal. There is no distension.     Palpations: Abdomen is soft.     Tenderness: There is no abdominal tenderness. There is no guarding.  Musculoskeletal:        General: Normal range of  motion.     Cervical back: Normal range of motion.  Skin:    General: Skin is warm and dry.  Neurological:     General: No focal deficit present.     Mental Status: She is alert and oriented to person, place, and time.  Psychiatric:        Mood and Affect: Mood normal.        Behavior: Behavior normal.      Labs on Admission: I have personally reviewed following labs and imaging studies  CBC: Recent Labs  Lab 04/03/22 0910  WBC 9.1  HGB 12.3  HCT 35.8*  MCV  76.8*  PLT 258   Basic Metabolic Panel: Recent Labs  Lab 04/03/22 0910  NA 140  K 3.7  CL 105  CO2 27  GLUCOSE 109*  BUN 10  CREATININE 0.84  CALCIUM 9.2   GFR: Estimated Creatinine Clearance: 69 mL/min (by C-G formula based on SCr of 0.84 mg/dL). Liver Function Tests: Recent Labs  Lab 04/03/22 0910  AST 19  ALT 18  ALKPHOS 62  BILITOT 0.6  PROT 8.0  ALBUMIN 3.9   No results for input(s): "LIPASE", "AMYLASE" in the last 168 hours. No results for input(s): "AMMONIA" in the last 168 hours. Coagulation Profile: No results for input(s): "INR", "PROTIME" in the last 168 hours. Cardiac Enzymes: No results for input(s): "CKTOTAL", "CKMB", "CKMBINDEX", "TROPONINI" in the last 168 hours. BNP (last 3 results) No results for input(s): "PROBNP" in the last 8760 hours. HbA1C: No results for input(s): "HGBA1C" in the last 72 hours. CBG: No results for input(s): "GLUCAP" in the last 168 hours. Lipid Profile: No results for input(s): "CHOL", "HDL", "LDLCALC", "TRIG", "CHOLHDL", "LDLDIRECT" in the last 72 hours. Thyroid Function Tests: No results for input(s): "TSH", "T4TOTAL", "FREET4", "T3FREE", "THYROIDAB" in the last 72 hours. Anemia Panel: No results for input(s): "VITAMINB12", "FOLATE", "FERRITIN", "TIBC", "IRON", "RETICCTPCT" in the last 72 hours. Urine analysis:    Component Value Date/Time   COLORURINE YELLOW 07/08/2021 1135   APPEARANCEUR CLEAR 07/08/2021 1135   APPEARANCEUR Clear 08/29/2018 0848    LABSPEC 1.012 07/08/2021 1135   PHURINE 5.5 07/08/2021 1135   GLUCOSEU NEGATIVE 07/08/2021 1135   HGBUR NEGATIVE 07/08/2021 1135   BILIRUBINUR NEGATIVE 07/04/2020 1744   BILIRUBINUR Negative 08/29/2018 0848   KETONESUR NEGATIVE 07/08/2021 1135   PROTEINUR TRACE (A) 07/08/2021 1135   NITRITE NEGATIVE 07/08/2021 1135   LEUKOCYTESUR NEGATIVE 07/08/2021 1135    Radiological Exams on Admission: I have personally reviewed images No results found.  EKG: I have personally reviewed EKG: sinus rhythm. No acute abnl  Assessment/Plan Principal Problem:   Rectal bleed Active Problems:   Rectal bleeding   Essential hypertension   Gastroesophageal reflux disease   Asthma    Assessment and Plan: Rectal bleeding Patient with known diverticulosis and h/o bleeds s/p microbead embolization 01/23/82 presents with frank hematochezia with heme positive stool on EDP exam. Hgb stable.  Plan Med-surg obs  H/H q 8 x 3  Already typed  Asthma Asymptomatic. Continue home regimen.   Gastroesophageal reflux disease NO active complaints  Plan Continue PPI  Essential hypertension BP stable. Continue home meds       DVT prophylaxis:  TEDs Code Status: Full Code Family Communication: husband present  Disposition Plan: home 24-48 hrs  Consults called: none   Admission status: Observation, Med-Surg   Illene Regulus, MD [facility] 04/03/2022, 1:06 PM

## 2022-04-03 NOTE — ED Notes (Signed)
Warm blanket provided, husband at bedside. Call bell in reach, bed in lowest locked position. Pt denies any needs at this time.

## 2022-04-03 NOTE — Assessment & Plan Note (Signed)
NO active complaints  Plan Continue PPI

## 2022-04-03 NOTE — Plan of Care (Signed)
  Problem: Education: Goal: Knowledge of General Education information will improve Description: Including pain rating scale, medication(s)/side effects and non-pharmacologic comfort measures Outcome: Progressing   Problem: Health Behavior/Discharge Planning: Goal: Ability to manage health-related needs will improve Outcome: Progressing   Problem: Clinical Measurements: Goal: Will remain free from infection Outcome: Progressing   Problem: Clinical Measurements: Goal: Diagnostic test results will improve Outcome: Progressing   Problem: Nutrition: Goal: Adequate nutrition will be maintained Outcome: Progressing   Problem: Elimination: Goal: Will not experience complications related to bowel motility Outcome: Progressing   Problem: Elimination: Goal: Will not experience complications related to urinary retention Outcome: Progressing   Problem: Pain Managment: Goal: General experience of comfort will improve Outcome: Progressing

## 2022-04-03 NOTE — ED Triage Notes (Signed)
PT BIB EMS for rectal bleeding started this morning. Hx of GI bleed. States 2 episodes of wine colored stool this morning. Denies any pain. Did have epigastric pain earlier this week but has resolved.

## 2022-04-03 NOTE — Assessment & Plan Note (Signed)
Holding Cardizem with GI bleed

## 2022-04-03 NOTE — ED Provider Notes (Signed)
[facility] Provider Note    Event Date/Time   First MD Initiated Contact with Patient 04/03/22 [phone]     (approximate)   History   Rectal Bleeding   HPI  Cheryl Coleman is a 66 y.o. female who complains of several episodes of bloody stool today.  Patient is not lightheaded does not have any belly pain but has had at least 3 bloody stools so far.  Patient had a prior colonoscopy which showed only diverticulosis.      Physical Exam   Triage Vital Signs: ED Triage Vitals  Enc Vitals Group     BP --      Pulse Rate 04/03/22 0855 92     Resp 04/03/22 0855 17     Temp --      Temp Source 04/03/22 0855 Oral     SpO2 04/03/22 0855 92 %     Weight 04/03/22 0838 190 lb (86.2 kg)     Height 04/03/22 0838 5\' 4"  (1.626 m)     Head Circumference --      Peak Flow --      Pain Score 04/03/22 0838 0     Pain Loc --      Pain Edu? --      Excl. in GC? --     Most recent vital signs: Vitals:   04/03/22 1100 04/03/22 1130  BP: 129/76 122/65  Pulse: 84 86  Resp: (!) 22 19  Temp:    SpO2: 99% 96%     General: Awake, no distress.  CV:  Good peripheral perfusion.  Heart regular rate and rhythm no audible murmur Resp:  Normal effort.  Lungs are clear Abd:  No distention.  Abdomen soft and nontender Rectal: Dark blood present in the rectal vault   ED Results / Procedures / Treatments   Labs (all labs ordered are listed, but only abnormal results are displayed) Labs Reviewed  COMPREHENSIVE METABOLIC PANEL - Abnormal; Notable for the following components:      Result Value   Glucose, Bld 109 (*)    All other components within normal limits  CBC - Abnormal; Notable for the following components:   HCT 35.8 (*)    MCV 76.8 (*)    All other components within normal limits  HEMOGLOBIN AND HEMATOCRIT, BLOOD  HEMOGLOBIN AND HEMATOCRIT, BLOOD  TYPE AND SCREEN     EKG     RADIOLOGY    PROCEDURES:  Critical Care performed:    Procedures   MEDICATIONS ORDERED IN ED: Medications  terbinafine (LAMISIL) tablet 250 mg (has no administration in time range)  atorvastatin (LIPITOR) tablet 10 mg (has no administration in time range)  diltiazem (CARDIZEM CD) 24 hr capsule 240 mg (has no administration in time range)  citalopram (CELEXA) tablet 10 mg (has no administration in time range)  pantoprazole (PROTONIX) EC tablet 80 mg (has no administration in time range)  albuterol (PROVENTIL) (2.5 MG/3ML) 0.083% nebulizer solution 2.5 mg (has no administration in time range)  fluticasone (FLONASE) 50 MCG/ACT nasal spray 2 spray (has no administration in time range)  montelukast (SINGULAIR) tablet 10 mg (has no administration in time range)  sodium chloride flush (NS) 0.9 % injection 3 mL (has no administration in time range)  sodium chloride flush (NS) 0.9 % injection 3 mL (has no administration in time range)  0.9 %  sodium chloride infusion (has no administration in time range)  acetaminophen (TYLENOL) tablet 650 mg (has no administration in time  range)    Or  acetaminophen (TYLENOL) suppository 650 mg (has no administration in time range)  traZODone (DESYREL) tablet 25 mg (has no administration in time range)     IMPRESSION / MDM / ASSESSMENT AND PLAN / ED COURSE  I reviewed the triage vital signs and the nursing notes.  Differential diagnosis includes, but is not limited to, diverticular bleed or bleed from a polyp or bleed from AVM.  Hemorrhoidal bleed is less likely as the blood is very dark.  Additionally I do not see any active hemorrhoids or some old hemorrhoidal tags however I do not palpate any internal hemorrhoids. We will get the patient in the hospital.  I have discussed the patient with the hospitalist.  I have been unable to get a hold of GI yet. Patient's presentation is most consistent with acute presentation with potential threat to life or bodily function.  The patient is on the cardiac monitor to  evaluate for evidence of arrhythmia and/or significant heart rate changes.  None have been seen     FINAL CLINICAL IMPRESSION(S) / ED DIAGNOSES   Final diagnoses:  Hematochezia     Rx / DC Orders   ED Discharge Orders     None        Note:  This document was prepared using Dragon voice recognition software and may include unintentional dictation errors.   Arnaldo Natal, MD 04/03/22 1346

## 2022-04-03 NOTE — Assessment & Plan Note (Deleted)
Patient with known diverticulosis and h/o bleeds s/p microbead embolization 01/23/82 presents with frank hematochezia with heme positive stool on EDP exam. Hgb stable.  Plan Med-surg obs  H/H q 8 x 3  Already typed

## 2022-04-03 NOTE — Assessment & Plan Note (Signed)
Asymptomatic. Continue home regimen.

## 2022-04-03 NOTE — Subjective & Objective (Signed)
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 She underwent angriography and microbead embolization of the colic and sigmoid arteries 03/23/48. Her last colonoscopy was 02/27/22 revealing extensive diverticulosis w/o other findings. She reports that this AM she had a blood BM. She has been asymptomatic. Due to the rectal bleed she presents to [facility] for evaluation.

## 2022-04-03 NOTE — Progress Notes (Signed)
 Cross Cover History of diverticular bleeds requiring micorembolization as recent as 01/2022, Nurse reported bloody stool. CT abdomen shows: IMPRESSION: No evidence of active GI bleeding.  Left colonic diverticulosis, without evidence of diverticulitis.  Cholelithiasis, without associated inflammatory changes  HGB drop from 12.3 to 9.3. GI was consult.  Dr IMPRESSION: No evidence of active GI bleeding. Left colonic diverticulosis, without evidence of diverticulitis. Cholelithiasis, without associated inflammatory changes  GI consulted, Called to Dr. Unk Erminio LITTIE Jesus NP [facility]

## 2022-04-04 ENCOUNTER — Observation Stay: Payer: Medicare HMO

## 2022-04-04 ENCOUNTER — Encounter
Admission: EM | Disposition: A | Discharge status: Home / Self Care | Payer: Self-pay | Source: Home / Self Care | Attending: Internal Medicine

## 2022-04-04 DIAGNOSIS — Z885 Allergy status to narcotic agent status: Secondary | ICD-10-CM | POA: Diagnosis not present

## 2022-04-04 DIAGNOSIS — K922 Gastrointestinal hemorrhage, unspecified: Secondary | ICD-10-CM | POA: Diagnosis not present

## 2022-04-04 DIAGNOSIS — D500 Iron deficiency anemia secondary to blood loss (chronic): Secondary | ICD-10-CM | POA: Diagnosis not present

## 2022-04-04 DIAGNOSIS — Z8249 Family history of ischemic heart disease and other diseases of the circulatory system: Secondary | ICD-10-CM | POA: Diagnosis not present

## 2022-04-04 DIAGNOSIS — I10 Essential (primary) hypertension: Secondary | ICD-10-CM

## 2022-04-04 DIAGNOSIS — R571 Hypovolemic shock: Secondary | ICD-10-CM | POA: Diagnosis not present

## 2022-04-04 DIAGNOSIS — K2100 Gastro-esophageal reflux disease with esophagitis, without bleeding: Secondary | ICD-10-CM

## 2022-04-04 DIAGNOSIS — E876 Hypokalemia: Secondary | ICD-10-CM | POA: Diagnosis not present

## 2022-04-04 DIAGNOSIS — K921 Melena: Secondary | ICD-10-CM

## 2022-04-04 DIAGNOSIS — Z9889 Other specified postprocedural states: Secondary | ICD-10-CM | POA: Diagnosis not present

## 2022-04-04 DIAGNOSIS — E785 Hyperlipidemia, unspecified: Secondary | ICD-10-CM | POA: Diagnosis not present

## 2022-04-04 DIAGNOSIS — E663 Overweight: Secondary | ICD-10-CM | POA: Diagnosis not present

## 2022-04-04 DIAGNOSIS — Z23 Encounter for immunization: Secondary | ICD-10-CM | POA: Diagnosis not present

## 2022-04-04 DIAGNOSIS — K8020 Calculus of gallbladder without cholecystitis without obstruction: Secondary | ICD-10-CM | POA: Diagnosis not present

## 2022-04-04 DIAGNOSIS — D62 Acute posthemorrhagic anemia: Secondary | ICD-10-CM | POA: Diagnosis not present

## 2022-04-04 DIAGNOSIS — J4520 Mild intermittent asthma, uncomplicated: Secondary | ICD-10-CM | POA: Diagnosis not present

## 2022-04-04 DIAGNOSIS — K5731 Diverticulosis of large intestine without perforation or abscess with bleeding: Secondary | ICD-10-CM | POA: Diagnosis not present

## 2022-04-04 DIAGNOSIS — K5730 Diverticulosis of large intestine without perforation or abscess without bleeding: Secondary | ICD-10-CM | POA: Diagnosis not present

## 2022-04-04 DIAGNOSIS — D72829 Elevated white blood cell count, unspecified: Secondary | ICD-10-CM | POA: Diagnosis not present

## 2022-04-04 DIAGNOSIS — K5909 Other constipation: Secondary | ICD-10-CM | POA: Diagnosis not present

## 2022-04-04 DIAGNOSIS — Z6837 Body mass index (BMI) 37.0-37.9, adult: Secondary | ICD-10-CM | POA: Diagnosis not present

## 2022-04-04 DIAGNOSIS — K219 Gastro-esophageal reflux disease without esophagitis: Secondary | ICD-10-CM | POA: Diagnosis not present

## 2022-04-04 DIAGNOSIS — Z95828 Presence of other vascular implants and grafts: Secondary | ICD-10-CM | POA: Diagnosis not present

## 2022-04-04 DIAGNOSIS — Z79899 Other long term (current) drug therapy: Secondary | ICD-10-CM | POA: Diagnosis not present

## 2022-04-04 DIAGNOSIS — Z8616 Personal history of COVID-19: Secondary | ICD-10-CM | POA: Diagnosis not present

## 2022-04-04 DIAGNOSIS — M1990 Unspecified osteoarthritis, unspecified site: Secondary | ICD-10-CM | POA: Diagnosis not present

## 2022-04-04 LAB — BASIC METABOLIC PANEL WITH GFR
Anion gap: 10 (ref 5–15)
BUN: 10 mg/dL (ref 8–23)
CO2: 22 mmol/L (ref 22–32)
Calcium: 8.7 mg/dL — ABNORMAL LOW (ref 8.9–10.3)
Chloride: 109 mmol/L (ref 98–111)
Creatinine, Ser: 0.82 mg/dL (ref 0.44–1.00)
GFR, Estimated: >60 mL/min
Glucose, Bld: 93 mg/dL (ref 70–99)
Potassium: 3.8 mmol/L (ref 3.5–5.1)
Sodium: 141 mmol/L (ref 135–145)

## 2022-04-04 LAB — IRON AND TIBC
Iron: 64 ug/dL (ref 28–170)
Saturation Ratios: 21 % (ref 10.4–31.8)
TIBC: 305 ug/dL (ref 250–450)
UIBC: 241 ug/dL

## 2022-04-04 LAB — HEMOGLOBIN AND HEMATOCRIT, BLOOD
HCT: 26.9 % — ABNORMAL LOW (ref 36.0–46.0)
HCT: 25.8 % — ABNORMAL LOW (ref 36.0–46.0)
Hemoglobin: 9.3 g/dL — ABNORMAL LOW (ref 12.0–15.0)
Hemoglobin: 8.8 g/dL — ABNORMAL LOW (ref 12.0–15.0)

## 2022-04-04 LAB — MRSA NEXT GEN BY PCR, NASAL: MRSA by PCR Next Gen: NOT DETECTED

## 2022-04-04 LAB — GLUCOSE, CAPILLARY: Glucose-Capillary: 105 mg/dL — ABNORMAL HIGH (ref 70–99)

## 2022-04-04 LAB — FOLATE: Folate: 26 ng/mL

## 2022-04-04 LAB — HEMOGLOBIN: Hemoglobin: 9.2 g/dL — ABNORMAL LOW (ref 12.0–15.0)

## 2022-04-04 LAB — PREPARE RBC (CROSSMATCH)

## 2022-04-04 LAB — FERRITIN: Ferritin: 22 ng/mL (ref 11–307)

## 2022-04-04 SURGERY — ESOPHAGOGASTRODUODENOSCOPY (EGD) WITH PROPOFOL
Anesthesia: General

## 2022-04-04 MED ORDER — PANTOPRAZOLE INFUSION (NEW) - SIMPLE MED
8.0000 mg/h | INTRAVENOUS | Status: DC
  Administered 2022-04-04 – 2022-04-05 (×2): 8 mg/h via INTRAVENOUS
  Filled 2022-04-04 (×2): qty 100

## 2022-04-04 MED ORDER — SODIUM CHLORIDE 0.9% IV SOLUTION: Freq: Once | INTRAVENOUS | Status: AC

## 2022-04-04 MED ORDER — CHLORHEXIDINE GLUCONATE CLOTH 2 % EX PADS
6.0000 | MEDICATED_PAD | Freq: Every day | CUTANEOUS | Status: DC
  Administered 2022-04-04: 6 via TOPICAL

## 2022-04-04 MED ORDER — DIPHENHYDRAMINE HCL 50 MG/ML IJ SOLN
12.5000 mg | Freq: Once | INTRAMUSCULAR | Status: AC
  Administered 2022-04-04: 12.5 mg via INTRAVENOUS
  Filled 2022-04-04: qty 1

## 2022-04-04 MED ORDER — PANTOPRAZOLE 80MG IVPB - SIMPLE MED
80.0000 mg | Freq: Once | INTRAVENOUS | Status: AC
  Administered 2022-04-04: 80 mg via INTRAVENOUS
  Filled 2022-04-04: qty 100

## 2022-04-04 MED ORDER — IOHEXOL 350 MG/ML SOLN
100.0000 mL | Freq: Once | INTRAVENOUS | Status: AC | PRN
  Administered 2022-04-04: 100 mL via INTRAVENOUS

## 2022-04-04 MED ORDER — PANTOPRAZOLE SODIUM 40 MG IV SOLR: 40.0000 mg | Freq: Two times a day (BID) | INTRAVENOUS | Status: DC

## 2022-04-04 MED ORDER — SODIUM CHLORIDE 0.9% IV SOLUTION: Freq: Once | INTRAVENOUS | Status: DC

## 2022-04-04 MED ORDER — ONDANSETRON HCL 4 MG/2ML IJ SOLN
4.0000 mg | Freq: Four times a day (QID) | INTRAMUSCULAR | Status: DC | PRN
  Administered 2022-04-06: 4 mg via INTRAVENOUS
  Filled 2022-04-04 (×2): qty 2

## 2022-04-04 MED ORDER — TECHNETIUM TC 99M-LABELED RED BLOOD CELLS IV KIT
20.0000 | PACK | Freq: Once | INTRAVENOUS | Status: AC | PRN
  Administered 2022-04-04: 21.53 via INTRAVENOUS

## 2022-04-04 MED ORDER — ACETAMINOPHEN 325 MG PO TABS: 650.0000 mg | ORAL_TABLET | Freq: Once | ORAL | Status: DC

## 2022-04-04 NOTE — Assessment & Plan Note (Deleted)
Bleeding scan positive at the distal transverse colon.  Last hemoglobin 8.8.  Serial hemoglobins and transfuse as needed.

## 2022-04-04 NOTE — Progress Notes (Addendum)
Called with positive bleeding scan and the distal transverse colon 75 minutes of imaging.  Reviewed CT scan from 01/23/2022 that showed bleeding in the distal descending and proximal sigmoid colon.  Case discussed with radiologist that read the bleeding scan, case discussed with vascular surgery Dr. Lorenso Courier.  She does not do embolization procedures.  We will transfuse 1 unit of packed red blood cells now.  I will transfer to the stepdown unit for closer monitoring.  If the patient remains stable,  potentially we can wait till Monday for embolization procedure with Dr. Lucky Cowboy or Schnier.  I spoke with Dr. Kathlene Cote interventional radiology.  He is on-call this weekend if we need to do the procedure more urgently.  Dr Loletha Grayer

## 2022-04-04 NOTE — Progress Notes (Signed)
  Progress Note   Patient: Cheryl Coleman SEG:[id number] DOB: November 02, 1955 DOA: 04/03/2022     0 DOS: the patient was seen and examined on 04/04/2022     Assessment and Plan: Acute blood loss anemia Hemoglobin 12.3 upon admission and dropped down to 9.2.  Decrease rate of IV fluids.  Bleeding scan still pending.  Appreciate GI consultation.  Started on Protonix drip.  With burgundy stools could be upper or lower GI bleed.  With BUN and creatinine normal, less likely upper GI bleed  Hematochezia Bleeding scan pending.  Last 2 hemoglobin 9.3 and 9.2.  Will transfuse if drops toward 8.  Since the patient had a embolization 2 months ago we will be limited on intervention at this time.  Asthma Asymptomatic. Continue home regimen.   Gastroesophageal reflux disease Change PPI to IV  Essential hypertension Holding Cardizem with GI bleed        Subjective: Patient felt a little lightheaded yesterday.  Feeling okay today.  Had 2 bloody bowel movements.  The blood is darker than it was last time it is a burgundy color.  He did have some epigastric pain the other day.  No nausea or vomiting.  Couple months ago did have an embolization for lower GI diverticular bleeding  Physical Exam: Vitals:   04/04/22 0403 04/04/22 0800 04/04/22 1432 04/04/22 1518  BP: (!) 108/55 130/78 138/67 122/69  Pulse: 84 93 96 96  Resp: 18 16    Temp: 98.4 F (36.9 C) 97.8 F (36.6 C)    TempSrc:  Oral    SpO2: 96% 99%    Weight:      Height:       Physical Exam HENT:     Head: Normocephalic.     Mouth/Throat:     Pharynx: No oropharyngeal exudate.  Eyes:     General: Lids are normal.     Conjunctiva/sclera: Conjunctivae normal.  Cardiovascular:     Rate and Rhythm: Normal rate and regular rhythm.     Heart sounds: Normal heart sounds, S1 normal and S2 normal.  Pulmonary:     Breath sounds: No decreased breath sounds, wheezing, rhonchi or rales.  Abdominal:     Palpations: Abdomen is soft.      Tenderness: There is no abdominal tenderness.  Musculoskeletal:     Right lower leg: No swelling.     Left lower leg: No swelling.  Skin:    General: Skin is warm.     Findings: No rash.  Neurological:     Mental Status: She is alert and oriented to person, place, and time.     Data Reviewed: Last hemoglobin 9.2, BUN 10 and creatinine 0.82  Family Communication: Spoke with husband at the bedside this morning . Disposition: Status is: Inpatient Remains inpatient appropriate because: Still having active GI bleed, bleeding scan pending.  Planned Discharge Destination: Home    Time spent: 30 minutes Case discussed with nursing staff and gastroenterology  Author: Loletha Grayer, MD 04/04/2022 3:35 PM  For on call review [email or web address].

## 2022-04-04 NOTE — Assessment & Plan Note (Addendum)
Hemoglobin 12.3 upon admission.  Patient received 1 unit of packed red blood cells on 04/04/2022.  2 units of packed red blood cells overnight on 04/05/2022.  Most recent hemoglobin 8.4.  IV iron given on 04/07/2022

## 2022-04-04 NOTE — Plan of Care (Signed)

## 2022-04-04 NOTE — Progress Notes (Signed)
Patient called and wanted to speak with this nurse. Patient wanted to discuss her concerns with her care. She stated that she was feeling dizzy and had just had another stool, which was her sixth one today. Patient was concerned and wanted the doctor called.Patient "did not want what happened last hospital visit to happened again", where "Rapid Response was called" and per patient she "went into shock". Patient v/s were taken. NP was notified and made aware of patient concerns. Orders were placed, IV fluids started, H&H in four hours, CTA with contrast GI consult. Order for new IV. Patient had concerns about the contrast of the CT scan and how it made her feel. NP came to pt room to discuss concerns. Order for Benadryl placed. New IV was placed and pt went down for scan. Returned and v/s taken. CTA negative for active bleed. Patient was sleep and husband made aware of results.

## 2022-04-04 NOTE — Consult Note (Signed)
Cheryl Darby, MD [address]  [place]  [place], [place] [address]  Main: [phone]  Fax: [phone] Pager: [phone]   Consultation  Referring Provider:     No ref. provider found Primary Care Physician:  [name], Nino Glow, MD Primary Gastroenterologist:  Dr. Sherri Sear      Reason for Consultation: Hematochezia  Date of Admission:  04/03/2022 Date of Consultation:  04/04/2022         HPI:   Cheryl Coleman is a 66 y.o. female with history of hypertension, chronic constipation, colonic diverticulosis is admitted with maroon-colored bowel movements that she noticed yesterday morning.  She describes her stools are wine colored or burgundy colored.  She reports that sometime on Tuesday, she had epigastric discomfort, took 2 Tylenols and it resolved.  She denies any abdominal cramps or recurrence of abdominal pain nausea or vomiting.  She reports that last week, she has been somewhat constipated.  She generally takes prune juice daily to keep her bowels regular.  Patient had left colonic diverticular bleed on 01/23/2022, CT angio was positive, underwent embolization.  Subsequently, she underwent colonoscopy on 02/27/2022 which revealed nonbleeding external hemorrhoids and left colonic diverticulosis only.  Patient has been hemodynamically stable other than feeling dizzy or lightheaded when she stands up.  She was noted to have a drop in hemoglobin from 12.8 baseline to 11 on admission, further dropped to 9.2 today.  CT angio GI bleed earlier this morning was negative for active GI bleed.  Her hemoglobin has been stable since this morning.  She had 2 further episodes of burgundy colored bowel movements today.  Labs reveal normal BUN/creatinine. Dr. Leslye Peer started her on pantoprazole drip  NSAIDs: None  Antiplts/Anticoagulants/Anti thrombotics: None  GI Procedures:  Upper endoscopy 08/29/2020 - Normal examined duodenum. Biopsied. - Erythematous mucosa in the gastric  body. Biopsied. - Normal incisura and antrum. Biopsied. - Small hiatal hernia. - Esophagogastric landmarks identified. - Normal gastroesophageal junction and esophagus. Biopsied.  Colonoscopy 02/27/2022 - Diverticulosis in the recto-sigmoid colon, in the sigmoid colon and in the descending colon. - Non-bleeding external hemorrhoids. - No specimens collected.   Past Medical History:  Diagnosis Date   Arthritis    Asthma    COVID    COVID-19    07/24/20, 05/19/21   Diverticulitis    GERD (gastroesophageal reflux disease)    GIB (gastrointestinal bleeding)    diverticular 01/23/22-01/24/22 [facility] s/p emobolization & 15 years ago in [place]   Hiatal hernia    History of blood transfusion    History of chicken pox    History of GI bleed    2013/14   Hyperlipidemia    Hypertension    Leukocytosis    Type O blood, Rh positive     Past Surgical History:  Procedure Laterality Date   BREAST CYST ASPIRATION Right    CESAREAN SECTION     04/27/84   COLONOSCOPY     COLONOSCOPY WITH PROPOFOL N/A 02/27/2022   Procedure: COLONOSCOPY WITH PROPOFOL;  Surgeon: Lin Landsman, MD;  Location: [facility];  Service: Gastroenterology;  Laterality: N/A;   EMBOLIZATION N/A 01/23/2022   Procedure: EMBOLIZATION;  Surgeon: Katha Cabal, MD;  Location: [facility];  Service: Cardiovascular;  Laterality: N/A;   ESOPHAGOGASTRODUODENOSCOPY (EGD) WITH PROPOFOL N/A 08/29/2020   Procedure: ESOPHAGOGASTRODUODENOSCOPY (EGD) WITH PROPOFOL;  Surgeon: Lin Landsman, MD;  Location: [facility];  Service: Gastroenterology;  Laterality: N/A;   ORIF ACETABULAR FRACTURE  Current Facility-Administered Medications:    0.9 %  sodium chloride infusion (Manually program via Guardrails IV Fluids), , Intravenous, Once, Wieting, Richard, MD   0.9 %  sodium chloride infusion, 250 mL, Intravenous, PRN, Norins, Heinz Knuckles, MD   acetaminophen (TYLENOL) tablet 650 mg, 650 mg, Oral, Q6H PRN **OR**  acetaminophen (TYLENOL) suppository 650 mg, 650 mg, Rectal, Q6H PRN, Norins, Heinz Knuckles, MD   acetaminophen (TYLENOL) tablet 650 mg, 650 mg, Oral, Once, Wieting, Richard, MD   albuterol (PROVENTIL) (2.5 MG/3ML) 0.083% nebulizer solution 2.5 mg, 2.5 mg, Inhalation, Q6H PRN, Norins, Heinz Knuckles, MD   atorvastatin (LIPITOR) tablet 10 mg, 10 mg, Oral, q1800, Norins, Heinz Knuckles, MD, 10 mg at 04/03/22 1821   citalopram (CELEXA) tablet 10 mg, 10 mg, Oral, Daily, Norins, Heinz Knuckles, MD   fluticasone (FLONASE) 50 MCG/ACT nasal spray 2 spray, 2 spray, Each Nare, Daily PRN, Norins, Heinz Knuckles, MD   lactated ringers infusion, , Intravenous, Continuous, Wieting, Richard, MD, Last Rate: 75 mL/hr at 04/04/22 1231, Rate Change at 04/04/22 1231   montelukast (SINGULAIR) tablet 10 mg, 10 mg, Oral, QHS, Norins, Heinz Knuckles, MD   [START ON 04/07/2022] pantoprazole (PROTONIX) injection 40 mg, 40 mg, Intravenous, Q12H, Wieting, Richard, MD   pantoprozole (PROTONIX) 80 mg /NS 100 mL infusion, 8 mg/hr, Intravenous, Continuous, Wieting, Richard, MD   sodium chloride flush (NS) 0.9 % injection 3 mL, 3 mL, Intravenous, Q12H, Norins, Heinz Knuckles, MD, 3 mL at 04/04/22 1233   sodium chloride flush (NS) 0.9 % injection 3 mL, 3 mL, Intravenous, PRN, Norins, Heinz Knuckles, MD   terbinafine (LAMISIL) tablet 250 mg, 250 mg, Oral, Daily, Norins, Heinz Knuckles, MD, 250 mg at 04/03/22 1818   traZODone (DESYREL) tablet 25 mg, 25 mg, Oral, QHS PRN, Norins, Heinz Knuckles, MD   Family History  Problem Relation Age of Onset   Hypertension Maternal Aunt    Diabetes Maternal Aunt    Breast cancer Neg Hx      Social History   Tobacco Use   Smoking status: Never   Smokeless tobacco: Never  Vaping Use   Vaping Use: Never used  Substance Use Topics   Alcohol use: Not Currently   Drug use: Not Currently    Allergies as of 04/03/2022 - Review Complete 04/03/2022  Allergen Reaction Noted   Oxycodone Other (See Comments) 08/18/2018   Dilaudid  [hydromorphone] Other (See Comments) 01/29/2022    Review of Systems:    All systems reviewed and negative except where noted in HPI.   Physical Exam:  Vital signs in last 24 hours: Temp:  [97.8 F (36.6 C)-99 F (37.2 C)] 97.8 F (36.6 C) (09/16 0800) Pulse Rate:  [84-98] 93 (09/16 0800) Resp:  [16-21] 16 (09/16 0800) BP: (101-130)/(50-78) 130/78 (09/16 0800) SpO2:  [96 %-100 %] 99 % (09/16 0800) Last BM Date : 04/03/22 General:   Pleasant, cooperative in NAD Head:  Normocephalic and atraumatic. Eyes:   No icterus.   Conjunctiva pink. PERRLA. Ears:  Normal auditory acuity. Neck:  Supple; no masses or thyroidomegaly Lungs: Respirations even and unlabored. Lungs clear to auscultation bilaterally.   No wheezes, crackles, or rhonchi.  Heart:  Regular rate and rhythm;  Without murmur, clicks, rubs or gallops Abdomen:  Soft, nondistended, nontender. Normal bowel sounds. No appreciable masses or hepatomegaly.  No rebound or guarding.  Rectal:  Not performed. Msk:  Symmetrical without gross deformities.  Strength generalized weakness Extremities:  Without edema, cyanosis or clubbing. Neurologic:  Alert and oriented  x3;  grossly normal neurologically. Skin:  Intact without significant lesions or rashes. Psych:  Alert and cooperative. Normal affect.  LAB RESULTS:    Latest Ref Rng & Units 04/04/2022    1:35 PM 04/04/2022    4:40 AM 04/03/2022   11:04 PM  CBC  Hemoglobin 12.0 - 15.0 g/dL 9.2  9.3  10.4   Hematocrit 36.0 - 46.0 %  26.9  30.2     BMET    Latest Ref Rng & Units 04/04/2022   12:11 PM 04/03/2022    9:10 AM 01/29/2022   11:45 AM  BMP  Glucose 70 - 99 mg/dL 93  109  92   BUN 8 - 23 mg/dL $Remove'10  10  9   'zWJhGco$ Creatinine 0.44 - 1.00 mg/dL 0.82  0.84  0.94   Sodium 135 - 145 mmol/L 141  140  136   Potassium 3.5 - 5.1 mmol/L 3.8  3.7  3.8   Chloride 98 - 111 mmol/L 109  105  102   CO2 22 - 32 mmol/L $RemoveB'22  27  27   'kFRICRUL$ Calcium 8.9 - 10.3 mg/dL 8.7  9.2  9.4     LFT    Latest Ref  Rng & Units 04/03/2022    9:10 AM 01/24/2022   10:29 AM 01/23/2022    8:01 AM  Hepatic Function  Total Protein 6.5 - 8.1 g/dL 8.0  7.7  8.6   Albumin 3.5 - 5.0 g/dL 3.9  3.7  4.4   AST 15 - 41 U/L $Remo'19  30  22   'DshQn$ ALT 0 - 44 U/L $Remo'18  15  17   'ZqfgD$ Alk Phosphatase 38 - 126 U/L 62  43  63   Total Bilirubin 0.3 - 1.2 mg/dL 0.6  0.8  0.7      STUDIES: CT ANGIO GI BLEED  Result Date: 04/04/2022 CLINICAL DATA:  Lower GI bleed EXAM: CTA ABDOMEN AND PELVIS WITHOUT AND WITH CONTRAST TECHNIQUE: Multidetector CT imaging of the abdomen and pelvis was performed using the standard protocol during bolus administration of intravenous contrast. Multiplanar reconstructed images and MIPs were obtained and reviewed to evaluate the vascular anatomy. RADIATION DOSE REDUCTION: This exam was performed according to the departmental dose-optimization program which includes automated exposure control, adjustment of the mA and/or kV according to patient size and/or use of iterative reconstruction technique. CONTRAST:  162mL OMNIPAQUE IOHEXOL 350 MG/ML SOLN COMPARISON:  CTA GI bleed dated 01/23/2022 FINDINGS: VASCULAR Patent abdominal vasculature.  Atherosclerotic calcifications. No evidence of active GI bleeding. No contrast extravasation into the lumen of the colon. Review of the MIP images confirms the above findings. NON-VASCULAR Lower chest: Lung bases are essentially clear. Hepatobiliary: Liver is within normal limits. Layering gallstones (series 8/image 31), without associated inflammatory changes. No intrahepatic or extrahepatic duct dilatation. Pancreas: Within normal limits. Spleen: Within normal limits. Adrenals/Urinary Tract: Adrenal glands are within normal limits. Kidneys are within normal limits.  No hydronephrosis. Bladder is within normal limits. Stomach/Bowel: Stomach is within normal limits. No evidence of bowel obstruction. Numerous left colonic diverticuli, without associated diverticulitis. Lymphatic: No suspicious  abdominopelvic lymphadenopathy. Reproductive: Uterus is within normal limits. Bilateral ovaries are within normal limits. Other: No abdominopelvic ascites. Musculoskeletal: Mild degenerative changes at L4-5. IMPRESSION: No evidence of active GI bleeding. Left colonic diverticulosis, without evidence of diverticulitis. Cholelithiasis, without associated inflammatory changes. Electronically Signed   By: Julian Hy M.D.   On: 04/04/2022 00:56      Impression / Plan:   Alycia Rossetti  is a 66 y.o. present female with history of hypertension, colonic diverticulosis, chronic constipation, history of left colonic diverticular bleed in 7/23 s/p embolization presented with hematochezia  Hematochezia, likely representing diverticular bleed or bleeding from hemorrhoids as patient was recently experiencing constipation CT angio was negative, recommend tagged RBC scan Normal BUN/creatinine, very less likely upper GI bleed N.p.o. for now pending tagged RBC scan results Hemoglobin is stable today  Thank you for involving me in the care of this patient.  GI will follow along with you    LOS: 0 days   Sherri Sear, MD  04/04/2022, 2:06 PM    Note: This dictation was prepared with Dragon dictation along with smaller phrase technology. Any transcriptional errors that result from this process are unintentional.

## 2022-04-05 ENCOUNTER — Inpatient Hospital Stay: Payer: Medicare HMO

## 2022-04-05 DIAGNOSIS — D62 Acute posthemorrhagic anemia: Secondary | ICD-10-CM | POA: Diagnosis not present

## 2022-04-05 DIAGNOSIS — J4520 Mild intermittent asthma, uncomplicated: Secondary | ICD-10-CM | POA: Diagnosis not present

## 2022-04-05 DIAGNOSIS — K5731 Diverticulosis of large intestine without perforation or abscess with bleeding: Secondary | ICD-10-CM | POA: Diagnosis not present

## 2022-04-05 DIAGNOSIS — K921 Melena: Secondary | ICD-10-CM | POA: Diagnosis not present

## 2022-04-05 DIAGNOSIS — K2100 Gastro-esophageal reflux disease with esophagitis, without bleeding: Secondary | ICD-10-CM | POA: Diagnosis not present

## 2022-04-05 HISTORY — PX: IR US GUIDE VASC ACCESS RIGHT: IMG2390

## 2022-04-05 HISTORY — PX: IR ANGIOGRAM VISCERAL SELECTIVE: IMG657

## 2022-04-05 HISTORY — PX: IR EMBO ART  VEN HEMORR LYMPH EXTRAV  INC GUIDE ROADMAPPING: IMG5450

## 2022-04-05 LAB — BASIC METABOLIC PANEL WITH GFR
Anion gap: 6 (ref 5–15)
BUN: 11 mg/dL (ref 8–23)
CO2: 25 mmol/L (ref 22–32)
Calcium: 8.7 mg/dL — ABNORMAL LOW (ref 8.9–10.3)
Chloride: 110 mmol/L (ref 98–111)
Creatinine, Ser: 0.88 mg/dL (ref 0.44–1.00)
GFR, Estimated: >60 mL/min
Glucose, Bld: 104 mg/dL — ABNORMAL HIGH (ref 70–99)
Potassium: 3.6 mmol/L (ref 3.5–5.1)
Sodium: 141 mmol/L (ref 135–145)

## 2022-04-05 LAB — HEMATOCRIT: HCT: 20 % — ABNORMAL LOW (ref 36.0–46.0)

## 2022-04-05 LAB — HEMOGLOBIN AND HEMATOCRIT, BLOOD
HCT: 27.2 % — ABNORMAL LOW (ref 36.0–46.0)
HCT: 26.1 % — ABNORMAL LOW (ref 36.0–46.0)
HCT: 25.9 % — ABNORMAL LOW (ref 36.0–46.0)
HCT: 23.5 % — ABNORMAL LOW (ref 36.0–46.0)
Hemoglobin: 9.2 g/dL — ABNORMAL LOW (ref 12.0–15.0)
Hemoglobin: 8.9 g/dL — ABNORMAL LOW (ref 12.0–15.0)
Hemoglobin: 8.8 g/dL — ABNORMAL LOW (ref 12.0–15.0)
Hemoglobin: 8 g/dL — ABNORMAL LOW (ref 12.0–15.0)

## 2022-04-05 LAB — VITAMIN B12: Vitamin B-12: 408 pg/mL (ref 180–914)

## 2022-04-05 LAB — MAGNESIUM: Magnesium: 1.9 mg/dL (ref 1.7–2.4)

## 2022-04-05 LAB — HEMOGLOBIN: Hemoglobin: 6.9 g/dL — ABNORMAL LOW (ref 12.0–15.0)

## 2022-04-05 LAB — PREPARE RBC (CROSSMATCH)

## 2022-04-05 MED ORDER — PANTOPRAZOLE SODIUM 40 MG PO TBEC
40.0000 mg | DELAYED_RELEASE_TABLET | Freq: Every day | ORAL | Status: DC
  Administered 2022-04-05: 40 mg via ORAL
  Filled 2022-04-05: qty 1

## 2022-04-05 MED ORDER — MIDAZOLAM HCL 2 MG/2ML IJ SOLN
INTRAMUSCULAR | Status: AC
  Filled 2022-04-05: qty 2

## 2022-04-05 MED ORDER — SODIUM CHLORIDE 0.9% IV SOLUTION: Freq: Once | INTRAVENOUS | Status: DC

## 2022-04-05 MED ORDER — SODIUM CHLORIDE 0.9 % IV SOLN
300.0000 mg | Freq: Once | INTRAVENOUS | Status: AC
  Administered 2022-04-05: 300 mg via INTRAVENOUS
  Filled 2022-04-05: qty 15

## 2022-04-05 MED ORDER — FENTANYL CITRATE (PF) 100 MCG/2ML IJ SOLN
INTRAMUSCULAR | Status: AC
  Filled 2022-04-05: qty 2

## 2022-04-05 MED ORDER — FENTANYL CITRATE (PF) 100 MCG/2ML IJ SOLN
INTRAMUSCULAR | Status: AC | PRN
  Administered 2022-04-05 (×2): 25 ug via INTRAVENOUS
  Administered 2022-04-05: 50 ug via INTRAVENOUS

## 2022-04-05 MED ORDER — SODIUM CHLORIDE 0.9 % IV BOLUS
1000.0000 mL | Freq: Once | INTRAVENOUS | Status: AC
  Administered 2022-04-05: 1000 mL via INTRAVENOUS

## 2022-04-05 MED ORDER — DIPHENHYDRAMINE HCL 50 MG/ML IJ SOLN
12.5000 mg | Freq: Once | INTRAMUSCULAR | Status: AC
  Administered 2022-04-05: 12.5 mg via INTRAVENOUS
  Filled 2022-04-05: qty 1

## 2022-04-05 MED ORDER — SODIUM CHLORIDE 0.9% IV SOLUTION: Freq: Once | INTRAVENOUS | Status: AC

## 2022-04-05 MED ORDER — IOHEXOL 350 MG/ML SOLN
100.0000 mL | Freq: Once | INTRAVENOUS | Status: AC | PRN
  Administered 2022-04-05: 100 mL via INTRAVENOUS

## 2022-04-05 MED ORDER — IODIXANOL 320 MG/ML IV SOLN
100.0000 mL | Freq: Once | INTRAVENOUS | Status: AC | PRN
  Administered 2022-04-05: 40 mL via INTRA_ARTERIAL

## 2022-04-05 MED ORDER — SIMETHICONE 40 MG/0.6ML PO SUSP (UNIT DOSE)
80.0000 mg | Freq: Once | ORAL | Status: AC
  Administered 2022-04-05: 80 mg via ORAL
  Filled 2022-04-05: qty 1.2
  Filled 2022-04-05: qty 30
  Filled 2022-04-05: qty 1.2

## 2022-04-05 MED ORDER — MIDAZOLAM HCL 2 MG/2ML IJ SOLN
INTRAMUSCULAR | Status: AC | PRN
  Administered 2022-04-05 (×2): 1 mg via INTRAVENOUS

## 2022-04-05 MED ORDER — DICYCLOMINE HCL 10 MG PO CAPS
10.0000 mg | ORAL_CAPSULE | Freq: Three times a day (TID) | ORAL | Status: DC | PRN
  Administered 2022-04-05: 10 mg via ORAL
  Filled 2022-04-05 (×2): qty 1

## 2022-04-05 MED ORDER — SODIUM CHLORIDE 0.9 % IV SOLN: INTRAVENOUS | Status: DC

## 2022-04-05 NOTE — Progress Notes (Signed)
   04/05/22 2030  Vitals  BP (!) 84/52  MAP (mmHg) (!) 62  BP Location Right Arm  BP Method Automatic  Patient Position (if appropriate) Lying  Pulse Rate (!) 109  Pulse Rate Source Monitor  ECG Heart Rate (!) 107  Resp 15  Level of Consciousness  Level of Consciousness Alert  Oxygen Therapy  SpO2 98 %  O2 Device Room Air  Patient Activity (if Appropriate) In bed  Pulse Oximetry Type Continuous   Pt. had a large bloody stool; Symptomatic. Dr. Damita Dunnings is aware. Ordered stat H&H. 1 Liter NS bolus initiated by receiving RN.

## 2022-04-05 NOTE — Progress Notes (Signed)
Just notified with another bloody bowel movement.  Last hemoglobin 8.8.  Her first episode of bleeding today was right before that 8.8 was drawn.  Since another bloody bowel movement now I will transfuse 1 unit of packed red blood cells.  Case discussed with Dr. Kathlene Cote interventional radiology.

## 2022-04-05 NOTE — Progress Notes (Signed)
[name], MD [address]  [address]  [place], [place] [address]  Main: [phone]  Fax: [phone] Pager: [phone]   Subjective: Patient had 1 bloody bowel movement today.  She underwent tagged RBC scan which revealed bleeding from distal transverse colon.  Patient is transferred to ICU for close monitoring, has been hemodynamically stable.  Her cousin is bedside.   Objective: Vital signs in last 24 hours: Vitals:   04/05/22 0807 04/05/22 0900 04/05/22 1000 04/05/22 1100  BP:      Pulse: 93 (!) 101  96  Resp: 18 17 (!) 25 17  Temp:      TempSrc:      SpO2: 98% 99% 99% 99%  Weight:      Height:       Weight change: 13.4 kg  Intake/Output Summary (Last 24 hours) at 04/05/2022 1316 Last data filed at 04/05/2022 0600 Gross per 24 hour  Intake 1408.93 ml  Output 1 ml  Net 1407.93 ml     Exam: Heart:: Regular rate and rhythm, S1S2 present, or without murmur or extra heart sounds Lungs: normal and clear to auscultation Abdomen: soft, nontender, normal bowel sounds   Lab Results:    Latest Ref Rng & Units 04/05/2022   10:37 AM 04/05/2022    9:03 AM 04/05/2022   12:52 AM  CBC  Hemoglobin 12.0 - 15.0 g/dL 8.8  8.9  9.2   Hematocrit 36.0 - 46.0 % 25.9  26.1  27.2       Latest Ref Rng & Units 04/05/2022    4:37 AM 04/04/2022   12:11 PM 04/03/2022    9:10 AM  CMP  Glucose 70 - 99 mg/dL 154  93  008   BUN 8 - 23 mg/dL 11  10  10    Creatinine 0.44 - 1.00 mg/dL  6.76  1.95   Sodium 135 - 145 mmol/L 141  141  140   Potassium 3.5 - 5.1 mmol/L 3.6  3.8  3.7   Chloride 98 - 111 mmol/L 110  109  105   CO2 22 - 32 mmol/L 25  22  27    Calcium 8.9 - 10.3 mg/dL 8.7  8.7  9.2   Total Protein 6.5 - 8.1 g/dL   8.0   Total Bilirubin 0.3 - 1.2 mg/dL   0.6   Alkaline Phos 38 - 126 U/L   62   AST 15 - 41 U/L   19   ALT 0 - 44 U/L   18     Micro Results: Recent Results (from the past 240 hour(s))  MRSA Next Gen by PCR, Nasal     Status: None    Collection Time: 04/04/22  8:21 PM   Specimen: Nasal Mucosa; Nasal Swab  Result Value Ref Range Status   MRSA by PCR Next Gen NOT DETECTED NOT DETECTED Final    Comment: (NOTE) The GeneXpert MRSA Assay (FDA approved for NASAL specimens only), is one component of a comprehensive MRSA colonization surveillance program. It is not intended to diagnose MRSA infection nor to guide or monitor treatment for MRSA infections. Test performance is not FDA approved in patients less than 96 years old. Performed at [facility], [address] [place], [address] [place]    Studies/Results: NM GI Blood Loss  Result Date: 04/04/2022 CLINICAL DATA:  Blood loss, hematochezia EXAM: NUCLEAR MEDICINE GASTROINTESTINAL BLEEDING SCAN TECHNIQUE: Sequential abdominal images were obtained following intravenous administration of Tc-45m labeled red blood  cells. RADIOPHARMACEUTICALS:  21.53 mCi Tc-79m pertechnetate in-vitro labeled red cells. COMPARISON:  CT GI bleed examination 04/04/2022 FINDINGS: Expected blood pool and urinary tract expiratory radiotracer accumulation. Peristalsing colonic radiotracer activity is identified at approximately 75 minutes of imaging and originates in the distal transverse colon. IMPRESSION: Peristalsing colonic radiotracer activity is identified at approximately 75 minutes of imaging and originates in the distal transverse colon. These results will be called to the ordering clinician or representative by the Radiologist Assistant, and communication documented in the PACS or [employer]. Electronically Signed   By: Delanna Ahmadi M.D.   On: 04/04/2022 17:03   CT ANGIO GI BLEED  Result Date: 04/04/2022 CLINICAL DATA:  Lower GI bleed EXAM: CTA ABDOMEN AND PELVIS WITHOUT AND WITH CONTRAST TECHNIQUE: Multidetector CT imaging of the abdomen and pelvis was performed using the standard protocol during bolus administration of intravenous contrast. Multiplanar reconstructed images and  MIPs were obtained and reviewed to evaluate the vascular anatomy. RADIATION DOSE REDUCTION: This exam was performed according to the departmental dose-optimization program which includes automated exposure control, adjustment of the mA and/or kV according to patient size and/or use of iterative reconstruction technique. CONTRAST:  145mL OMNIPAQUE IOHEXOL 350 MG/ML SOLN COMPARISON:  CTA GI bleed dated 01/23/2022 FINDINGS: VASCULAR Patent abdominal vasculature.  Atherosclerotic calcifications. No evidence of active GI bleeding. No contrast extravasation into the lumen of the colon. Review of the MIP images confirms the above findings. NON-VASCULAR Lower chest: Lung bases are essentially clear. Hepatobiliary: Liver is within normal limits. Layering gallstones (series 8/image 31), without associated inflammatory changes. No intrahepatic or extrahepatic duct dilatation. Pancreas: Within normal limits. Spleen: Within normal limits. Adrenals/Urinary Tract: Adrenal glands are within normal limits. Kidneys are within normal limits.  No hydronephrosis. Bladder is within normal limits. Stomach/Bowel: Stomach is within normal limits. No evidence of bowel obstruction. Numerous left colonic diverticuli, without associated diverticulitis. Lymphatic: No suspicious abdominopelvic lymphadenopathy. Reproductive: Uterus is within normal limits. Bilateral ovaries are within normal limits. Other: No abdominopelvic ascites. Musculoskeletal: Mild degenerative changes at L4-5. IMPRESSION: No evidence of active GI bleeding. Left colonic diverticulosis, without evidence of diverticulitis. Cholelithiasis, without associated inflammatory changes. Electronically Signed   By: Julian Hy M.D.   On: 04/04/2022 00:56   Medications: I have reviewed the patient's current medications. Prior to Admission:  Medications Prior to Admission  Medication Sig Dispense Refill Last Dose   atorvastatin (LIPITOR) 10 MG tablet TAKE 1 TABLET (10 MG  TOTAL) BY MOUTH DAILY AT 6 PM. 90 tablet 3 04/02/2022   citalopram (CELEXA) 10 MG tablet Take 1 tablet (10 mg total) by mouth daily. 90 tablet 3 04/03/2022   clotrimazole-betamethasone (LOTRISONE) cream Apply 1 Application topically 2 (two) times daily. 30 g 2 04/03/2022   diltiazem (CARDIZEM CD) 120 MG 24 hr capsule Take 1 capsule (120 mg total) by mouth daily. qhs 90 capsule 3 04/02/2022   diltiazem (CARDIZEM CD) 240 MG 24 hr capsule Take 1 capsule (240 mg total) by mouth daily. In am 90 capsule 3 04/03/2022   montelukast (SINGULAIR) 10 MG tablet TAKE 1 TABLET BY MOUTH EVERYDAY AT BEDTIME (Patient taking differently: Take 10 mg by mouth at bedtime.) 90 tablet 3 04/02/2022   omeprazole (PRILOSEC) 40 MG capsule TAKE 1 CAPSULE BY MOUTH TWICE A DAY 180 capsule 0 04/03/2022   Specialty Vitamins Products (WOMENS VITA PAK PO) Take by mouth daily.   04/03/2022   terbinafine (LAMISIL) 250 MG tablet Take 1 tablet (250 mg total) by mouth daily. 90 tablet  0 Past Week   albuterol (VENTOLIN HFA) 108 (90 Base) MCG/ACT inhaler INHALE 1-2 PUFFS BY MOUTH EVERY 6 (SIX) HOURS AS NEEDED 18 each 11    fluticasone (FLONASE) 50 MCG/ACT nasal spray SPRAY 2 SPRAYS INTO EACH NOSTRIL EVERY DAY (Patient taking differently: Place 2 sprays into both nostrils daily as needed for allergies or rhinitis.) 48 mL 3    Scheduled:  sodium chloride   Intravenous Once   acetaminophen  650 mg Oral Once   atorvastatin  10 mg Oral q1800   Chlorhexidine Gluconate Cloth  6 each Topical Daily   citalopram  10 mg Oral Daily   montelukast  10 mg Oral QHS   pantoprazole  40 mg Oral Daily   sodium chloride flush  3 mL Intravenous Q12H   terbinafine  250 mg Oral Daily   Continuous:  sodium chloride     iron sucrose     lactated ringers 75 mL/hr at 04/05/22 1313   LEX:NTZGYF chloride, acetaminophen **OR** acetaminophen, albuterol, dicyclomine, fluticasone, ondansetron (ZOFRAN) IV, sodium chloride flush, traZODone Anti-infectives (From admission,  onward)    Start     Dose/Rate Route Frequency Ordered Stop   04/03/22 1400  terbinafine (LAMISIL) tablet 250 mg        250 mg Oral Daily 04/03/22 1236        Scheduled Meds:  sodium chloride   Intravenous Once   acetaminophen  650 mg Oral Once   atorvastatin  10 mg Oral q1800   Chlorhexidine Gluconate Cloth  6 each Topical Daily   citalopram  10 mg Oral Daily   montelukast  10 mg Oral QHS   pantoprazole  40 mg Oral Daily   sodium chloride flush  3 mL Intravenous Q12H   terbinafine  250 mg Oral Daily   Continuous Infusions:  sodium chloride     iron sucrose     lactated ringers 75 mL/hr at 04/05/22 1313   PRN Meds:.sodium chloride, acetaminophen **OR** acetaminophen, albuterol, dicyclomine, fluticasone, ondansetron (ZOFRAN) IV, sodium chloride flush, traZODone   Assessment: Active Problems:   Essential hypertension   Gastroesophageal reflux disease   Asthma   Hematochezia   Acute blood loss anemia   GI bleed  66 year old pleasant female with history of chronic constipation, diverticular bleed presents with recurrent diverticular bleed.  Tagged RBC scan revealed tracer activity in the distal transverse colon  Plan: The on-call vascular surgeon does not perform embolization during weekends.  Interventional radiologist is available in case patient needs urgent or emergent embolization, she will be transferred to [facility] No further recs from GI standpoint.  GI will sign off at this time, please call us back with questions or concerns   LOS: 1 day   Rithika Seel 04/05/2022, 1:16 PM

## 2022-04-05 NOTE — Procedures (Addendum)
Interventional Radiology Procedure Note  Procedure: Selective IMA and SMA arteriography. Embolization of distal left colic supply.  Complications: None  Estimated Blood Loss: < 10 mL  Access: Right CFA  Closure: AngioSeal  Findings: IMA arteriography does not demonstrate active bleed/contrast extravasation. No AVM or other vascular abnormalities. Additional selective arteriography performed via microcatheter in left colic artery.  Based on region of positive bleeding scan, distal aspect of ascending branch of left colic artery catheterized where it meets marginal artery supply from the SMA distribution. A single 2 mm embolization coil was deposited to diminish cross-flow from the SMA territory and try to reduce perfusion pressure to the region of bleeding by bleeding scan.  SMA arteriography does not demonstrate active bleeding or other vascular abnormality.  Venetia Night. Kathlene Cote, M.D Pager:  [phone]

## 2022-04-05 NOTE — Progress Notes (Addendum)
Cross cover event progress Note   Patient: Cheryl Coleman [id number] DOB: October 23, 1955 DOA: 04/03/2022     1 DOS: the patient was seen and examined on 04/05/2022    HPI/Events of Note   "She had a large bloody BM. Feeling about to passout"  Chart review Patient admitted 9/15 with acute blood loss anemia from diverticulosis requiring 1 unit PRBC and with positive bleeding scan.  Underwent selective IMA and SMA arteriography by Dr. Kathlene Cote on 04/05/22 with embolization of the distal left colic supply.  No active bleeding noted.  Patient was returned to the floor.Went on to have two large bloody BMs after the procedure, became lightheaded after second event with systolic dropping to 84.  Patient currently awake and alert and mentating well.  Husband at bedside.    04/05/2022    8:30 PM 04/05/2022    8:00 PM 04/05/2022    7:00 PM  Vitals with BMI  Systolic 84 [id number]  Diastolic 52 66 65  Pulse 0000000 104 95     Assessment and  Interventions   Assessment: Suspect ongoing active GI bleed  Plan: Stat NS 1 L bolus and stat hemoglobin Spoke with Dr. Marius Ditch who advised to discuss with IR Discussed with IR, Dr. Kathlene Cote who recommended repeating CT angio.  If CTA negative recommends consulting surgery for consideration of resection.  If positive will consult IR tonight for restudy.  He also mentioned the possibility of GI considering doing an unprepped colonoscopy      Assessment and Plan: Acute blood loss anemia secondary to hematochezia which suspected active bleeding Hypotension secondary to hypovolemia -IV fluid resuscitation - Stat CTA - Serial H&H and transfuse if needed - Reconsult IR if CTA positive, per discussion with Dr. Kathlene Cote - Consult surgery if CTA negative   Subjective: Patient awake and alert and able to give history.  States that she had 2 large bloody bowel movements since her return following her procedure earlier today.  She denies chest pains or palpitations.  Physical  Exam: Vitals:   04/05/22 1827 04/05/22 1900 04/05/22 2000 04/05/22 2030  BP: 112/64 119/65 111/66 (!) 84/52  Pulse: 99 95 (!) 104 (!) 109  Resp: 16 (!) 21 16 15   Temp:      TempSrc:      SpO2: 100% 100% 99% 98%  Weight:      Height:      Physical Exam Vitals and nursing note reviewed.  Constitutional:      General: She is not in acute distress. HENT:     Head: Normocephalic and atraumatic.  Cardiovascular:     Rate and Rhythm: Regular rhythm. Tachycardia present.     Heart sounds: Normal heart sounds.  Pulmonary:     Effort: Pulmonary effort is normal.     Breath sounds: Normal breath sounds.  Abdominal:     Palpations: Abdomen is soft.     Tenderness: There is no abdominal tenderness.  Neurological:     Mental Status: Mental status is at baseline.     CRITICAL CARE Performed by: Athena Masse   Total critical care time: 60 minutes  Critical care time was exclusive of separately billable procedures and treating other patients.  Critical care was necessary to treat or prevent imminent or life-threatening deterioration.  Critical care was time spent personally by me on the following activities: development of treatment plan with patient and/or surrogate as well as nursing, discussions with consultants, evaluation of patient's response to treatment, examination of patient, obtaining  history from patient or surrogate, ordering and performing treatments and interventions, ordering and review of laboratory studies, ordering and review of radiographic studies, pulse oximetry and re-evaluation of patient's condition.   Author: Athena Masse, MD 04/05/2022 9:35 PM  For on call review [email or web address].

## 2022-04-05 NOTE — Progress Notes (Signed)
Patient returned from procedure at 1714; right femoral access site assessed and pressure dressing in place with no no visible drainage, pulse sites assessed as well, patient is laying flat in bed.

## 2022-04-05 NOTE — Progress Notes (Signed)
Patient had another episode of bowel movement with large amount of bright and dark red blood with multiple small clots present, Dr. Leslye Peer notified and ordered for 1 unit PRBC.

## 2022-04-05 NOTE — Progress Notes (Signed)
  Progress Note   Patient: Cheryl Coleman SNK:[id number] DOB: 07-01-1956 DOA: 04/03/2022     1 DOS: the patient was seen and examined on 04/05/2022     Assessment and Plan: Acute blood loss anemia Hemoglobin 12.3 upon admission and dropped down to 8.8.  Bleeding scan positive in the distal transverse colon.  Patient received 1 unit of packed red blood cells yesterday.  If further bleeding will require more blood.  Case discussed with vascular surgery and interventional radiology team.  Interventional radiology could do a procedure today if further bleeding or hemodynamically unstable.  If remains stable may end up needing the vascular team tomorrow to do procedure.  N.p.o. in case procedure needed today  Hematochezia Bleeding scan positive at the distal transverse colon.  Last hemoglobin 8.8.  Serial hemoglobins and transfuse as needed.  Asthma Asymptomatic. Continue home regimen.   Gastroesophageal reflux disease Change PPI  back to oral  Essential hypertension Holding Cardizem with GI bleed        Subjective: Patient seen this morning and she was feeling okay.  Has not had any major bleeding episodes overnight.  Called back to the room where she had an episode of bleeding around 10 AM.  Physical Exam: Vitals:   04/05/22 0807 04/05/22 0900 04/05/22 1000 04/05/22 1100  BP:      Pulse: 93 (!) 101  96  Resp: 18 17 (!) 25 17  Temp:      TempSrc:      SpO2: 98% 99% 99% 99%  Weight:      Height:       Physical Exam HENT:     Head: Normocephalic.     Mouth/Throat:     Pharynx: No oropharyngeal exudate.  Eyes:     General: Lids are normal.     Conjunctiva/sclera: Conjunctivae normal.  Cardiovascular:     Rate and Rhythm: Normal rate and regular rhythm.     Heart sounds: Normal heart sounds, S1 normal and S2 normal.  Pulmonary:     Breath sounds: No decreased breath sounds, wheezing, rhonchi or rales.  Abdominal:     Palpations: Abdomen is soft.     Tenderness: There is  abdominal tenderness in the right lower quadrant and left upper quadrant.  Musculoskeletal:     Right lower leg: No swelling.     Left lower leg: No swelling.  Skin:    General: Skin is warm.     Findings: No rash.  Neurological:     Mental Status: She is alert and oriented to person, place, and time.     Data Reviewed: Last hemoglobin 8.8, chemistry within normal limits  Family Communication: Spoke with husband at the bedside  Disposition: Status is: Inpatient Remains inpatient appropriate because: Continue to watch for further bleeding and transfuse as necessary.  Will likely need an embolization at some point.  Planned Discharge Destination: Home    Time spent: 30 minutes Case discussed with interventional radiologist Dr. Kathlene Cote, vascular surgery and nursing staff  Author: Loletha Grayer, MD 04/05/2022 1:24 PM  For on call review [email or web address].

## 2022-04-05 NOTE — Progress Notes (Signed)
Patient transferred to vascular for emobolization procedure in hospital bed on cardiac monitoring, this RN present during transport.  Per vascular staff hold off administering the unit of PRBC until after the patient returns to the floor and/or recheck a H and H to reassess need for PRBC once procedure is completed.  Updated Dr. Leslye Peer who ordered to have H and H rechecked before administering PRBC once patient returns from procedure.

## 2022-04-05 NOTE — Progress Notes (Signed)
Patient had a BM and large amount of bright red and dark red blood was observed; Dr. Leslye Peer notified and came to bedside, new order to have hmg and hct checked now received.

## 2022-04-05 NOTE — Progress Notes (Signed)
Notified Dr. Leslye Peer of hmg of 8; will continue to monitor patient at this time and will endorse to oncoming shift.

## 2022-04-05 NOTE — Progress Notes (Signed)
       CROSS COVER NOTE  NAME: Cheryl Coleman MRN: [id number] DOB : 1956-07-01    Date of Service   04/05/22  HPI/Events of Note   Received a Call from [facility] radiology patient actively bleeding from distal transverse colon Called to follow-up on blood work that had not yet resulted and lab reports that hemoglobin ordered earlier was 6.9 Called nurse to follow-up on patient and on checking on patient she was having a near syncopal episode on the commode and SBP was 69   Assessment and  Interventions   Assessment: Active GI bleed Hemorrhagic shock secondary to active GI bleed  Plan: Stat NS bolus.  Called blood bank for emergent unit Transfusing two units PRBC with third on standby Discussed with Dr. Kathlene Cote who does not think that repeating CTA is much utility and recommends calling surgery to evaluate for colonic resection in the geographic area of the bleed Discussed with on-call surgeon, Dr. Milas Gain who will come in to evaluate patient and discuss colectomy.  We reviewed Dr. Margaretmary Dys recommendation in his note while on the phone. Called ICU for consult and transfer to their service as patient is now hemodynamically unstable

## 2022-04-05 NOTE — Consult Note (Signed)
[facility] Vascular Consult Note  MRN : [id number]  Cheryl Coleman is a 66 y.o. ([birth year]) female who presents with chief complaint of  Chief Complaint  Patient presents with   Rectal Bleeding  .  History of Present Illness: Patient known to service- diverticular bleed- Left colic/sigmoid embolization July 2023 by Dr. Delana Meyer. Presents with recurrent bleeding- tagged scan identified area in distal transverse colon. Has remained HD stable. HgB 12.3-9.2- continues to slowly trend down. Bleeding today. Tachycardiac. awaiting repeat HgB. GI and IR consulted  Current Facility-Administered Medications  Medication Dose Route Frequency Provider Last Rate Last Admin   0.9 %  sodium chloride infusion (Manually program via Guardrails IV Fluids)   Intravenous Once Loletha Grayer, MD   Held at 04/04/22 1230   0.9 %  sodium chloride infusion  250 mL Intravenous PRN Norins, Heinz Knuckles, MD       acetaminophen (TYLENOL) tablet 650 mg  650 mg Oral Q6H PRN Neena Rhymes, MD   650 mg at 04/05/22 7829   Or   acetaminophen (TYLENOL) suppository 650 mg  650 mg Rectal Q6H PRN Norins, Heinz Knuckles, MD       acetaminophen (TYLENOL) tablet 650 mg  650 mg Oral Once Loletha Grayer, MD       albuterol (PROVENTIL) (2.5 MG/3ML) 0.083% nebulizer solution 2.5 mg  2.5 mg Inhalation Q6H PRN Norins, Heinz Knuckles, MD       atorvastatin (LIPITOR) tablet 10 mg  10 mg Oral q1800 Norins, Heinz Knuckles, MD   10 mg at 04/03/22 1821   Chlorhexidine Gluconate Cloth 2 % PADS 6 each  6 each Topical Daily Loletha Grayer, MD   6 each at 04/04/22 2010   citalopram (CELEXA) tablet 10 mg  10 mg Oral Daily Norins, Heinz Knuckles, MD   10 mg at 04/05/22 0841   dicyclomine (BENTYL) capsule 10 mg  10 mg Oral TID PRN Loletha Grayer, MD       fluticasone (FLONASE) 50 MCG/ACT nasal spray 2 spray  2 spray Each Nare Daily PRN Norins, Heinz Knuckles, MD       iron sucrose (VENOFER) 300 mg in sodium chloride 0.9 % 250 mL IVPB  300 mg  Intravenous Once Vanga, Tally Due, MD       lactated ringers infusion   Intravenous Continuous Loletha Grayer, MD   Stopped at 04/04/22 2336   montelukast (SINGULAIR) tablet 10 mg  10 mg Oral QHS Norins, Heinz Knuckles, MD   10 mg at 04/05/22 0838   ondansetron (ZOFRAN) injection 4 mg  4 mg Intravenous Q6H PRN Loletha Grayer, MD       pantoprazole (PROTONIX) EC tablet 40 mg  40 mg Oral Daily Loletha Grayer, MD   40 mg at 04/05/22 0839   sodium chloride flush (NS) 0.9 % injection 3 mL  3 mL Intravenous Q12H Norins, Heinz Knuckles, MD   3 mL at 04/04/22 2105   sodium chloride flush (NS) 0.9 % injection 3 mL  3 mL Intravenous PRN Norins, Heinz Knuckles, MD       terbinafine (LAMISIL) tablet 250 mg  250 mg Oral Daily Norins, Heinz Knuckles, MD   250 mg at 04/05/22 0841   traZODone (DESYREL) tablet 25 mg  25 mg Oral QHS PRN Neena Rhymes, MD        Past Medical History:  Diagnosis Date   Arthritis    Asthma    COVID    COVID-19    07/24/20, 05/19/21  Diverticulitis    GERD (gastroesophageal reflux disease)    GIB (gastrointestinal bleeding)    diverticular 01/23/22-01/24/22 [facility] s/p emobolization & 15 years ago in [place]   Hiatal hernia    History of blood transfusion    History of chicken pox    History of GI bleed    2013/14   Hyperlipidemia    Hypertension    Leukocytosis    Type O blood, Rh positive     Past Surgical History:  Procedure Laterality Date   BREAST CYST ASPIRATION Right    CESAREAN SECTION     04/27/84   COLONOSCOPY     COLONOSCOPY WITH PROPOFOL N/A 02/27/2022   Procedure: COLONOSCOPY WITH PROPOFOL;  Surgeon: [name], MD;  Location: [facility] ENDOSCOPY;  Service: Gastroenterology;  Laterality: N/A;   EMBOLIZATION N/A 01/23/2022   Procedure: EMBOLIZATION;  Surgeon: [name], MD;  Location: [facility] INVASIVE CV LAB;  Service: Cardiovascular;  Laterality: N/A;   ESOPHAGOGASTRODUODENOSCOPY (EGD) WITH PROPOFOL N/A 08/29/2020   Procedure: ESOPHAGOGASTRODUODENOSCOPY (EGD)  WITH PROPOFOL;  Surgeon: [name], MD;  Location: [facility] ENDOSCOPY;  Service: Gastroenterology;  Laterality: N/A;   ORIF ACETABULAR FRACTURE      Social History Social History   Tobacco Use   Smoking status: Never   Smokeless tobacco: Never  Vaping Use   Vaping Use: Never used  Substance Use Topics   Alcohol use: Not Currently   Drug use: Not Currently    Family History Family History  Problem Relation Age of Onset   Hypertension Maternal Aunt    Diabetes Maternal Aunt    Breast cancer Neg Hx     Allergies  Allergen Reactions   Oxycodone Other (See Comments)    Feels bad   Dilaudid [Hydromorphone] Other (See Comments)    Too drowsy     REVIEW OF SYSTEMS (Negative unless checked)  Constitutional: [] Weight loss  [] Fever  [] Chills Cardiac: [] Chest pain   [] Chest pressure   [] Palpitations   [] Shortness of breath when laying flat   [] Shortness of breath at rest   [] Shortness of breath with exertion. Vascular:  [] Pain in legs with walking   [] Pain in legs at rest   [] Pain in legs when laying flat   [] Claudication   [] Pain in feet when walking  [] Pain in feet at rest  [] Pain in feet when laying flat   [] History of DVT   [] Phlebitis   [] Swelling in legs   [] Varicose veins   [] Non-healing ulcers Pulmonary:   [] Uses home oxygen   [] Productive cough   [] Hemoptysis   [] Wheeze  [] COPD   [] Asthma Neurologic:  [x] Dizziness  [] Blackouts   [] Seizures   [] History of stroke   [] History of TIA  [] Aphasia   [] Temporary blindness   [] Dysphagia   [] Weakness or numbness in arms   [] Weakness or numbness in legs Musculoskeletal:  [] Arthritis   [] Joint swelling   [] Joint pain   [] Low back pain Hematologic:  [] Easy bruising  [] Easy bleeding   [] Hypercoagulable state   [] Anemic  [] Hepatitis Gastrointestinal:  [x] Blood in stool   [] Vomiting blood  [] Gastroesophageal reflux/heartburn   [] Difficulty swallowing. Genitourinary:  [] Chronic kidney disease   [] Difficult urination  [] Frequent  urination  [] Burning with urination   [] Blood in urine Skin:  [] Rashes   [] Ulcers   [] Wounds Psychological:  [] History of anxiety   []  History of major depression.  Physical Examination  Vitals:   04/05/22 0807 04/05/22 0900 04/05/22 1000 04/05/22 1100  BP:  Pulse: 93 (!) 101  96  Resp: 18 17 (!) 25 17  Temp:      TempSrc:      SpO2: 98% 99% 99% 99%  Weight:      Height:       Body mass index is 37.69 kg/m. Gen:  WD/WN, NAD Head: [place]/AT, No temporalis wasting. Prominent temp pulse not noted. Pulmonary:  Good air movement, respirations not labored, equal bilaterally.  Cardiac: tachycardiac, Vascular:  Vessel Right Left  Radial Palpable Palpable  Ulnar    Brachial    Carotid    Aorta Not palpable N/A  Femoral    Popliteal    PT    DP Palpable Palpable   Gastrointestinal: soft, non-tender/non-distended. No guarding/reflex.  Musculoskeletal: M/S 5/5 throughout.  Extremities without ischemic changes.  No deformity or atrophy. No edema. Neurologic: Sensation grossly intact in extremities.  Symmetrical.  Speech is fluent. Motor exam as listed above.      CBC Lab Results  Component Value Date   WBC 9.1 04/03/2022   HGB 8.8 (L) 04/05/2022   HCT 25.9 (L) 04/05/2022   MCV 76.8 (L) 04/03/2022   PLT 258 04/03/2022    BMET    Component Value Date/Time   NA 141 04/05/2022 0437   K 3.6 04/05/2022 0437   CL 110 04/05/2022 0437   CO2 25 04/05/2022 0437   GLUCOSE 104 (H) 04/05/2022 0437   BUN 11 04/05/2022 0437   CREATININE 0.88 04/05/2022 0437   CALCIUM 8.7 (L) 04/05/2022 0437   GFRNONAA >60 04/05/2022 0437   Estimated Creatinine Clearance: 71.2 mL/min (by C-G formula based on SCr of 0.88 mg/dL).  COAG No results found for: "INR", "PROTIME"  Radiology NM GI Blood Loss  Result Date: 04/04/2022 CLINICAL DATA:  Blood loss, hematochezia EXAM: NUCLEAR MEDICINE GASTROINTESTINAL BLEEDING SCAN TECHNIQUE: Sequential abdominal images were obtained following  intravenous administration of Tc-56m labeled red blood cells. RADIOPHARMACEUTICALS:  21.53 mCi Tc-47m pertechnetate in-vitro labeled red cells. COMPARISON:  CT GI bleed examination 04/04/2022 FINDINGS: Expected blood pool and urinary tract expiratory radiotracer accumulation. Peristalsing colonic radiotracer activity is identified at approximately 75 minutes of imaging and originates in the distal transverse colon. IMPRESSION: Peristalsing colonic radiotracer activity is identified at approximately 75 minutes of imaging and originates in the distal transverse colon. These results will be called to the ordering clinician or representative by the Radiologist Assistant, and communication documented in the PACS or [employer]. Electronically Signed   By: Jearld Lesch M.D.   On: 04/04/2022 17:03   CT ANGIO GI BLEED  Result Date: 04/04/2022 CLINICAL DATA:  Lower GI bleed EXAM: CTA ABDOMEN AND PELVIS WITHOUT AND WITH CONTRAST TECHNIQUE: Multidetector CT imaging of the abdomen and pelvis was performed using the standard protocol during bolus administration of intravenous contrast. Multiplanar reconstructed images and MIPs were obtained and reviewed to evaluate the vascular anatomy. RADIATION DOSE REDUCTION: This exam was performed according to the departmental dose-optimization program which includes automated exposure control, adjustment of the mA and/or kV according to patient size and/or use of iterative reconstruction technique. CONTRAST:  OMNIPAQUE IOHEXOL 350 MG/ML SOLN COMPARISON:  CTA GI bleed dated 01/23/2022 FINDINGS: VASCULAR Patent abdominal vasculature.  Atherosclerotic calcifications. No evidence of active GI bleeding. No contrast extravasation into the lumen of the colon. Review of the MIP images confirms the above findings. NON-VASCULAR Lower chest: Lung bases are essentially clear. Hepatobiliary: Liver is within normal limits. Layering gallstones (series 8/image 31), without associated  inflammatory changes. No intrahepatic  or extrahepatic duct dilatation. Pancreas: Within normal limits. Spleen: Within normal limits. Adrenals/Urinary Tract: Adrenal glands are within normal limits. Kidneys are within normal limits.  No hydronephrosis. Bladder is within normal limits. Stomach/Bowel: Stomach is within normal limits. No evidence of bowel obstruction. Numerous left colonic diverticuli, without associated diverticulitis. Lymphatic: No suspicious abdominopelvic lymphadenopathy. Reproductive: Uterus is within normal limits. Bilateral ovaries are within normal limits. Other: No abdominopelvic ascites. Musculoskeletal: Mild degenerative changes at L4-5. IMPRESSION: No evidence of active GI bleeding. Left colonic diverticulosis, without evidence of diverticulitis. Cholelithiasis, without associated inflammatory changes. Electronically Signed   By: Charline BillsSriyesh  Krishnan M.D.   On: 04/04/2022 00:56      Assessment/Plan 1. Distal Transverse colon bleed on tagged scan 2. Continue supportive care 3. May require embolization in the next day or so- bleeding today. HgB appears to be stable. Will follow along.    Bertram DenverEsco, Cyndie Woodbeck A, MD  04/05/2022 12:06 PM    This note was created with Dragon medical transcription system.  Any error is purely unintentional

## 2022-04-05 NOTE — Progress Notes (Incomplete)
       CROSS COVER NOTE  NAME: Sereena Marando MRN: [id number] DOB : [birth year]    Date of Service   04/05/22  HPI/Events of Note   Call from [facility] radiology patient actively bleeding.    Assessment and  Interventions   Assessment:  Plan: Transfusing two units PRBC for Hb 6.9 with third on standby Discussed with Dr. Kathlene Cote who does not think that repeating CTA hospitalist.  Hemoglobin X

## 2022-04-05 NOTE — Plan of Care (Signed)
Continuing with plan of care. 

## 2022-04-05 NOTE — Progress Notes (Signed)
With repeat hb being 8.  I will hold on transfusion for now.  If any further bleeding or drop in hb, may end up needing a unit of blood.  Patient had coil embolization this afternoon which hopefully will stop the bleeding.

## 2022-04-05 NOTE — Progress Notes (Signed)
Reached out to Dr. Kathlene Cote for clarification of bed rest post procedure, per Dr. Kathlene Cote patient to remain on bed rest 4 hours from 1700.

## 2022-04-05 NOTE — Consult Note (Signed)
Chief Complaint: Patient was seen in consultation today for persistent lower GI/rectal bleeding at the request of Dr. Alford Highlandichard Wieting  Referring Physician(s): Alford Highlandichard Wieting MD  [facility]: [facility] - [facility]  History of Present Illness: Cheryl Coleman is a 66 y.o. female with a history of diverticolosis and diverticular bleeds, status post prior embolization of sigmoid diverticular bleed on 01/23/22 by Dr. Gilda CreaseSchnier. Now admitted with recurrent bloody bowel movements which have continued in the hospital requiring one unit blood transfusion with another ordered for today. CTA negative for active bleeding just after midnight on 04/04/22 and bleeding scan positive for bleeding at level of distal transverse colon/splenic flexure yesterday around 3:30 PM. Today, has had 2 bloody bowel movements.  Denies abdominal pain. No N/V. No CP or SOB.  Past Medical History:  Diagnosis Date   Arthritis    Asthma    COVID    COVID-19    07/24/20, 05/19/21   Diverticulitis    GERD (gastroesophageal reflux disease)    GIB (gastrointestinal bleeding)    diverticular 01/23/22-01/24/22 [facility] s/p emobolization & [place] years ago in [place]   Hiatal hernia    History of blood transfusion    History of chicken pox    History of GI bleed    2013/14   Hyperlipidemia    Hypertension    Leukocytosis    Type O blood, Rh positive     Past Surgical History:  Procedure Laterality Date   BREAST CYST ASPIRATION Right    CESAREAN SECTION     04/27/84   COLONOSCOPY     COLONOSCOPY WITH PROPOFOL N/A 02/27/2022   Procedure: COLONOSCOPY WITH PROPOFOL;  Surgeon: Toney ReilVanga, Rohini Reddy, MD;  Location: [facility] ENDOSCOPY;  Service: Gastroenterology;  Laterality: N/A;   EMBOLIZATION N/A 01/23/2022   Procedure: EMBOLIZATION;  Surgeon: Renford DillsSchnier, Gregory G, MD;  Location: [facility] INVASIVE CV LAB;  Service: Cardiovascular;  Laterality: N/A;   ESOPHAGOGASTRODUODENOSCOPY (EGD) WITH PROPOFOL N/A 08/29/2020   Procedure: ESOPHAGOGASTRODUODENOSCOPY (EGD)  WITH PROPOFOL;  Surgeon: Toney ReilVanga, Rohini Reddy, MD;  [facility];  Service: Gastroenterology;  Laterality: N/A;   ORIF ACETABULAR FRACTURE      Allergies: Oxycodone and Dilaudid [hydromorphone]  Medications: Prior to Admission medications   Medication Sig Start Date End Date Taking? Authorizing Provider  atorvastatin (LIPITOR) 10 MG tablet TAKE 1 TABLET (10 MG TOTAL) BY MOUTH DAILY AT 6 PM. 09/04/21  Yes [name], Pasty Spillersracy N, MD  citalopram (CELEXA) 10 MG tablet Take 1 tablet (10 mg total) by mouth daily. 10/14/21  Yes [name], Pasty Spillersracy N, MD  clotrimazole-betamethasone (LOTRISONE) cream Apply 1 Application topically 2 (two) times daily. 03/25/22  Yes McDonald, Rachelle HoraAdam R, DPM  diltiazem (CARDIZEM CD) 120 MG 24 hr capsule Take 1 capsule (120 mg total) by mouth daily. qhs 01/14/22  Yes [name], Pasty Spillersracy N, MD  diltiazem (CARDIZEM CD) 240 MG 24 hr capsule Take 1 capsule (240 mg total) by mouth daily. In am 01/14/22  Yes [name], Pasty Spillersracy N, MD  montelukast (SINGULAIR) 10 MG tablet TAKE 1 TABLET BY MOUTH EVERYDAY AT BEDTIME Patient taking differently: Take 10 mg by mouth at bedtime. 09/15/21  Yes [name], Pasty Spillersracy N, MD  omeprazole (PRILOSEC) 40 MG capsule TAKE 1 CAPSULE BY MOUTH TWICE A DAY 02/08/22  Yes Eulis FosterWebb, Padonda B, FNP  Specialty Vitamins Products (WOMENS VITA PAK PO) Take by mouth daily.   Yes [provider]  terbinafine (LAMISIL) 250 MG tablet Take 1 tablet (250 mg total) by mouth daily. 03/25/22 06/23/22 Yes McDonald, Rachelle HoraAdam R,  DPM  albuterol (VENTOLIN HFA) 108 (90 Base) MCG/ACT inhaler INHALE 1-2 PUFFS BY MOUTH EVERY 6 (SIX) HOURS AS NEEDED 01/14/22   [name], [name], MD  fluticasone (FLONASE) 50 MCG/ACT nasal spray SPRAY 2 SPRAYS INTO EACH NOSTRIL EVERY DAY Patient taking differently: Place 2 sprays into both nostrils daily as needed for allergies or rhinitis. 09/09/21   [name], [name], MD     Family History  Problem Relation Age of  Onset   Hypertension Maternal Aunt    Diabetes Maternal Aunt    Breast cancer Neg Hx     Social History   Socioeconomic History   Marital status: Married    Spouse name: Not on file   Number of children: Not on file   Years of education: Not on file   Highest education level: Not on file  Occupational History   Not on file  Tobacco Use   Smoking status: Never   Smokeless tobacco: Never  Vaping Use   Vaping Use: Never used  Substance and Sexual Activity   Alcohol use: Not Currently   Drug use: Not Currently   Sexual activity: Yes    Comment: husband   Other Topics Concern   Not on file  Social History Narrative   Lived in [place] from [place]    Married 1st husband died in late 90s/2000s   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, wears seat belt, safe in relationship    Social Determinants of Health   Financial Resource Strain: Not on file  Food Insecurity: No Food Insecurity (02/12/2022)   Hunger Vital Sign    Worried About Running Out of Food in the Last Year: Never true    Ran Out of Food in the Last Year: Never true  Transportation Needs: Not on file  Physical Activity: Not on file  Stress: Not on file  Social Connections: Not on file     Review of Systems: A 12 point ROS discussed and pertinent positives are indicated in the HPI above.  All other systems are negative.  Review of Systems  Constitutional: Negative.   Respiratory: Negative.    Cardiovascular: Negative.   Gastrointestinal:  Positive for blood in stool.  Genitourinary: Negative.   Musculoskeletal: Negative.   Neurological: Negative.     Vital Signs: BP 136/70   Pulse 96   Temp 98.7 F (37.1 C) (Oral)   Resp 14   Ht 5\' 4"  (1.626 m)   Wt 99.6 kg   SpO2 96%   BMI 37.69 kg/m    Physical Exam Vitals reviewed.  Constitutional:      General: She is not in acute distress.    Appearance: She is not diaphoretic.  Cardiovascular:     Rate and Rhythm: Regular rhythm. Tachycardia present.      Pulses: Normal pulses.     Heart sounds: Normal heart sounds. No murmur heard.    No friction rub. No gallop.  Pulmonary:     Effort: Pulmonary effort is normal. No respiratory distress.     Breath sounds: Normal breath sounds. No stridor. No wheezing, rhonchi or rales.  Abdominal:     General: Bowel sounds are normal. There is no distension.     Palpations: Abdomen is soft.     Tenderness: There is no abdominal tenderness. There is no guarding or rebound.     Hernia: No hernia is present.  Musculoskeletal:        General: No swelling.  Left lower leg: No edema.  Skin:    General: Skin is warm and dry.  Neurological:     General: No focal deficit present.     Mental Status: She is alert and oriented to person, place, and time.     Imaging: NM GI Blood Loss  Result Date: 04/04/2022 CLINICAL DATA:  Blood loss, hematochezia EXAM: NUCLEAR MEDICINE GASTROINTESTINAL BLEEDING SCAN TECHNIQUE: Sequential abdominal images were obtained following intravenous administration of Tc-55m labeled red blood cells. RADIOPHARMACEUTICALS:  21.53 mCi Tc-51m pertechnetate in-vitro labeled red cells. COMPARISON:  CT GI bleed examination 04/04/2022 FINDINGS: Expected blood pool and urinary tract expiratory radiotracer accumulation. Peristalsing colonic radiotracer activity is identified at approximately 75 minutes of imaging and originates in the distal transverse colon. IMPRESSION: Peristalsing colonic radiotracer activity is identified at approximately 75 minutes of imaging and originates in the distal transverse colon. These results will be called to the ordering clinician or representative by the Radiologist Assistant, and communication documented in the PACS or [employer]. Electronically Signed   By: Jearld Lesch M.D.   On: 04/04/2022 17:03   CT ANGIO GI BLEED  Result Date: 04/04/2022 CLINICAL DATA:  Lower GI bleed EXAM: CTA ABDOMEN AND PELVIS WITHOUT AND WITH CONTRAST TECHNIQUE: Multidetector CT  imaging of the abdomen and pelvis was performed using the standard protocol during bolus administration of intravenous contrast. Multiplanar reconstructed images and MIPs were obtained and reviewed to evaluate the vascular anatomy. RADIATION DOSE REDUCTION: This exam was performed according to the departmental dose-optimization program which includes automated exposure control, adjustment of the mA and/or kV according to patient size and/or use of iterative reconstruction technique. CONTRAST:  OMNIPAQUE IOHEXOL 350 MG/ML SOLN COMPARISON:  CTA GI bleed dated 01/23/2022 FINDINGS: VASCULAR Patent abdominal vasculature.  Atherosclerotic calcifications. No evidence of active GI bleeding. No contrast extravasation into the lumen of the colon. Review of the MIP images confirms the above findings. NON-VASCULAR Lower chest: Lung bases are essentially clear. Hepatobiliary: Liver is within normal limits. Layering gallstones (series 8/image 31), without associated inflammatory changes. No intrahepatic or extrahepatic duct dilatation. Pancreas: Within normal limits. Spleen: Within normal limits. Adrenals/Urinary Tract: Adrenal glands are within normal limits. Kidneys are within normal limits.  No hydronephrosis. Bladder is within normal limits. Stomach/Bowel: Stomach is within normal limits. No evidence of bowel obstruction. Numerous left colonic diverticuli, without associated diverticulitis. Lymphatic: No suspicious abdominopelvic lymphadenopathy. Reproductive: Uterus is within normal limits. Bilateral ovaries are within normal limits. Other: No abdominopelvic ascites. Musculoskeletal: Mild degenerative changes at L4-5. IMPRESSION: No evidence of active GI bleeding. Left colonic diverticulosis, without evidence of diverticulitis. Cholelithiasis, without associated inflammatory changes. Electronically Signed   By: Charline Bills M.D.   On: 04/04/2022 00:56    Labs:  CBC: Recent Labs    01/24/22 1537  01/29/22 1145 02/06/22 1110 04/03/22 0910 04/03/22 1400 04/04/22 1754 04/05/22 0052 04/05/22 0903 04/05/22 1037  WBC 16.7* 10.8* 12.0* 9.1  --   --   --   --   --   HGB 11.0* 12.1 12.5 12.3   < > 8.8* 9.2* 8.9* 8.8*  HCT 31.4* 35.8* 35.7* 35.8*   < > 25.8* 27.2* 26.1* 25.9*  PLT 194 243.0 304 258  --   --   --   --   --    < > = values in this interval not displayed.    COAGS: No results for input(s): "INR", "APTT" in the last 8760 hours.  BMP: Recent Labs    01/24/22 1029  01/29/22 1145 04/03/22 0910 04/04/22 1211 04/05/22 0437  NA 139 136 140 141 141  K 3.4* 3.8 3.7 3.8 3.6  CL 105 102 105 109 110  CO2 24 27 27 22 25   GLUCOSE 115* 92 109* 93 104*  BUN 8 9 10 10 11   CALCIUM 9.2 9.4 9.2 8.7* 8.7*  CREATININE 0.80 0.94 0.84 0.82 0.88  GFRNONAA >60  --  >60 >60 >60    LIVER FUNCTION TESTS: Recent Labs    10/10/21 0821 01/23/22 0801 01/24/22 1029 04/03/22 0910  BILITOT 0.4 0.7 0.8 0.6  AST 15 22 30 19   ALT 13 17 15 18   ALKPHOS 58 63 43 62  PROT 8.0 8.6* 7.7 8.0  ALBUMIN 4.2 4.4 3.7 3.9     Assessment and Plan:  Continued lower GI bleed in setting of known diffuse diverticulosis and positive bleeding scan at level of distal transverse/splenic flexure of colon. Will proceed with arteriography with possible embolization today. Consent obtained from patient.  Risks and benefi ts of arteriography were discussed with the patient including, but not limited to bleeding, infection, vascular injury or contrast induced renal failure.  This interventional procedure involves the use of X-rays and because of the nature of the planned procedure, it is possible that we will have prolonged use of X-ray fluoroscopy.  Potential radiation risks to you include (but are not limited to) the following: - A slightly elevated risk for cancer  several years later in life. This risk is typically less than 0.5% percent. This risk is low in comparison to the normal incidence of  human cancer, which is 33% for women and 50% for men according to the [employer]. - Radiation induced injury can include skin redness, resembling a rash, tissue breakdown / ulcers and hair loss (which can be temporary or permanent).   The likelihood of either of these occurring depends on the difficulty of the procedure and whether you are sensitive to radiation due to previous procedures, disease, or genetic conditions.   IF your procedure requires a prolonged use of radiation, you will be notified and given written instructions for further action.  It is your responsibility to monitor the irradiated area for the 2 weeks following the procedure and to notify your physician if you are concerned that you have suffered a radiation induced injury.    All of the patient's questions were answered, patient is agreeable to proceed.  Consent signed and in chart.   Thank you for this interesting consult.  I greatly enjoyed meeting Cheryl Coleman and look forward to participating in their care.  A copy of this report was sent to the requesting provider on this date.  Electronically Signed: Azzie Roup, MD 04/05/2022, 3:19 PM    I spent a total of 20 Minutes in face to face in clinical consultation, greater than 50% of which was counseling/coordinating care for GI bleed.

## 2022-04-06 ENCOUNTER — Encounter: Payer: Self-pay | Admitting: Vascular Surgery

## 2022-04-06 ENCOUNTER — Encounter
Admission: EM | Disposition: A | Discharge status: Home / Self Care | Payer: Self-pay | Source: Home / Self Care | Attending: Internal Medicine

## 2022-04-06 DIAGNOSIS — D62 Acute posthemorrhagic anemia: Secondary | ICD-10-CM | POA: Diagnosis not present

## 2022-04-06 DIAGNOSIS — D500 Iron deficiency anemia secondary to blood loss (chronic): Secondary | ICD-10-CM

## 2022-04-06 DIAGNOSIS — K922 Gastrointestinal hemorrhage, unspecified: Secondary | ICD-10-CM | POA: Diagnosis not present

## 2022-04-06 DIAGNOSIS — Z95828 Presence of other vascular implants and grafts: Secondary | ICD-10-CM

## 2022-04-06 DIAGNOSIS — I10 Essential (primary) hypertension: Secondary | ICD-10-CM | POA: Diagnosis not present

## 2022-04-06 DIAGNOSIS — K921 Melena: Secondary | ICD-10-CM | POA: Diagnosis not present

## 2022-04-06 DIAGNOSIS — R571 Hypovolemic shock: Secondary | ICD-10-CM

## 2022-04-06 DIAGNOSIS — Z9889 Other specified postprocedural states: Secondary | ICD-10-CM

## 2022-04-06 HISTORY — PX: EMBOLIZATION (CATH LAB): CATH118239

## 2022-04-06 HISTORY — DX: Hypovolemic shock: R57.1

## 2022-04-06 HISTORY — PX: EMBOLIZATION: CATH118239

## 2022-04-06 LAB — BASIC METABOLIC PANEL WITH GFR
Anion gap: 5 (ref 5–15)
BUN: 7 mg/dL — ABNORMAL LOW (ref 8–23)
CO2: 23 mmol/L (ref 22–32)
Calcium: 8.1 mg/dL — ABNORMAL LOW (ref 8.9–10.3)
Chloride: 113 mmol/L — ABNORMAL HIGH (ref 98–111)
Creatinine, Ser: 0.77 mg/dL (ref 0.44–1.00)
GFR, Estimated: >60 mL/min
Glucose, Bld: 116 mg/dL — ABNORMAL HIGH (ref 70–99)
Potassium: 3.7 mmol/L (ref 3.5–5.1)
Sodium: 141 mmol/L (ref 135–145)

## 2022-04-06 LAB — HEMOGLOBIN AND HEMATOCRIT, BLOOD
HCT: 26.8 % — ABNORMAL LOW (ref 36.0–46.0)
HCT: 26.6 % — ABNORMAL LOW (ref 36.0–46.0)
HCT: 24.3 % — ABNORMAL LOW (ref 36.0–46.0)
Hemoglobin: 9.2 g/dL — ABNORMAL LOW (ref 12.0–15.0)
Hemoglobin: 8.9 g/dL — ABNORMAL LOW (ref 12.0–15.0)
Hemoglobin: 8.3 g/dL — ABNORMAL LOW (ref 12.0–15.0)

## 2022-04-06 LAB — PREPARE RBC (CROSSMATCH)

## 2022-04-06 LAB — CBC
HCT: 25.5 % — ABNORMAL LOW (ref 36.0–46.0)
Hemoglobin: 8.8 g/dL — ABNORMAL LOW (ref 12.0–15.0)
MCH: 27.7 pg (ref 26.0–34.0)
MCHC: 34.5 g/dL (ref 30.0–36.0)
MCV: 80.2 fL (ref 80.0–100.0)
Platelets: 147 K/uL — ABNORMAL LOW (ref 150–400)
RBC: 3.18 MIL/uL — ABNORMAL LOW (ref 3.87–5.11)
RDW: 15.8 % — ABNORMAL HIGH (ref 11.5–15.5)
WBC: 13.7 K/uL — ABNORMAL HIGH (ref 4.0–10.5)
nRBC: 0 % (ref 0.0–0.2)

## 2022-04-06 LAB — MAGNESIUM: Magnesium: 1.6 mg/dL — ABNORMAL LOW (ref 1.7–2.4)

## 2022-04-06 LAB — PHOSPHORUS: Phosphorus: 3.6 mg/dL (ref 2.5–4.6)

## 2022-04-06 SURGERY — EMBOLIZATION
Anesthesia: Moderate Sedation

## 2022-04-06 MED ORDER — INFLUENZA VAC A&B SA ADJ QUAD 0.5 ML IM PRSY
0.5000 mL | PREFILLED_SYRINGE | INTRAMUSCULAR | Status: DC
  Filled 2022-04-06: qty 0.5

## 2022-04-06 MED ORDER — FENTANYL CITRATE (PF) 100 MCG/2ML IJ SOLN
INTRAMUSCULAR | Status: AC
  Filled 2022-04-06: qty 2

## 2022-04-06 MED ORDER — SODIUM CHLORIDE 0.9 % IV BOLUS
1000.0000 mL | Freq: Once | INTRAVENOUS | Status: AC
  Administered 2022-04-06: 1000 mL via INTRAVENOUS

## 2022-04-06 MED ORDER — NOREPINEPHRINE 4 MG/250ML-% IV SOLN
INTRAVENOUS | Status: AC
  Administered 2022-04-06: 5 ug/min via INTRAVENOUS
  Filled 2022-04-06: qty 250

## 2022-04-06 MED ORDER — DILTIAZEM HCL ER COATED BEADS 120 MG PO CP24
120.0000 mg | ORAL_CAPSULE | Freq: Every day | ORAL | Status: DC
  Filled 2022-04-06: qty 1

## 2022-04-06 MED ORDER — CEFAZOLIN SODIUM-DEXTROSE 2-4 GM/100ML-% IV SOLN
2.0000 g | INTRAVENOUS | Status: AC
  Administered 2022-04-06: 2 g via INTRAVENOUS

## 2022-04-06 MED ORDER — SODIUM CHLORIDE 0.9 % IV SOLN: INTRAVENOUS | Status: DC

## 2022-04-06 MED ORDER — FENTANYL CITRATE (PF) 100 MCG/2ML IJ SOLN
INTRAMUSCULAR | Status: DC | PRN
  Administered 2022-04-06: 50 ug via INTRAVENOUS

## 2022-04-06 MED ORDER — IODIXANOL 320 MG/ML IV SOLN
INTRAVENOUS | Status: DC | PRN
  Administered 2022-04-06: 45 mL via INTRA_ARTERIAL

## 2022-04-06 MED ORDER — CHLORHEXIDINE GLUCONATE CLOTH 2 % EX PADS
6.0000 | MEDICATED_PAD | Freq: Every day | CUTANEOUS | Status: DC
  Administered 2022-04-07 – 2022-04-08 (×2): 6 via TOPICAL

## 2022-04-06 MED ORDER — NOREPINEPHRINE 4 MG/250ML-% IV SOLN
2.0000 ug/min | INTRAVENOUS | Status: DC
  Administered 2022-04-06: 2 ug/min via INTRAVENOUS

## 2022-04-06 MED ORDER — PANTOPRAZOLE SODIUM 40 MG IV SOLR
40.0000 mg | Freq: Two times a day (BID) | INTRAVENOUS | Status: DC
  Administered 2022-04-06 – 2022-04-07 (×4): 40 mg via INTRAVENOUS
  Filled 2022-04-06 (×4): qty 10

## 2022-04-06 MED ORDER — ORAL CARE MOUTH RINSE: 15.0000 mL | OROMUCOSAL | Status: DC | PRN

## 2022-04-06 MED ORDER — MIDAZOLAM HCL 2 MG/2ML IJ SOLN
INTRAMUSCULAR | Status: AC
  Filled 2022-04-06: qty 2

## 2022-04-06 MED ORDER — PANTOPRAZOLE SODIUM 40 MG IV SOLR: 40.0000 mg | INTRAVENOUS | Status: DC

## 2022-04-06 MED ORDER — MAGNESIUM SULFATE 2 GM/50ML IV SOLN
2.0000 g | Freq: Once | INTRAVENOUS | Status: AC
  Administered 2022-04-06: 2 g via INTRAVENOUS
  Filled 2022-04-06: qty 50

## 2022-04-06 MED ORDER — MORPHINE SULFATE (PF) 2 MG/ML IV SOLN
2.0000 mg | INTRAVENOUS | Status: DC | PRN
  Administered 2022-04-06: 2 mg via INTRAVENOUS
  Filled 2022-04-06: qty 1

## 2022-04-06 MED ORDER — SODIUM CHLORIDE 0.9 % IV SOLN: 250.0000 mL | INTRAVENOUS | Status: DC

## 2022-04-06 MED ORDER — MIDAZOLAM HCL 2 MG/2ML IJ SOLN
INTRAMUSCULAR | Status: DC | PRN
  Administered 2022-04-06: 2 mg via INTRAVENOUS

## 2022-04-06 SURGICAL SUPPLY — 17 items
CATH ANGIO 5F PIGTAIL 65CM (CATHETERS) IMPLANT
CATH MICROCATH PRGRT 2.8F 110 (CATHETERS) IMPLANT
CATH VS15FR (CATHETERS) IMPLANT
DEVICE STARCLOSE SE CLOSURE (Vascular Products) IMPLANT
DEVICE TORQUE (MISCELLANEOUS) IMPLANT
GLIDEWIRE STIFF .35X180X3 HYDR (WIRE) IMPLANT
MICROCATH PROGREAT 2.8F 110 CM (CATHETERS) ×1
PACK ANGIOGRAPHY (CUSTOM PROCEDURE TRAY) ×1 IMPLANT
SHEATH BRITE TIP 5FRX11 (SHEATH) IMPLANT
SHEATH PROBE COVER 6X72 (BAG) IMPLANT
STOPCOCK 3 WAY MALE LL (IV SETS) ×1
STOPCOCK 3WAY MALE LL (IV SETS) IMPLANT
SYR EMBOSPHERE 500-700 2ML (Embolic) ×1 IMPLANT
SYR MEDRAD MARK 7 150ML (SYRINGE) IMPLANT
SYRINGE EMBOSPHERE 500-700 2ML (Embolic) IMPLANT
TUBING CONTRAST HIGH PRESS 72 (TUBING) IMPLANT
WIRE GUIDERIGHT .035X150 (WIRE) IMPLANT

## 2022-04-06 NOTE — Consult Note (Signed)
NAMEMarche Coleman, MRN:  [id number], DOB:  09/10/1955, LOS: 2 ADMISSION DATE:  04/03/2022, CONSULTATION DATE:  04/06/22 REFERRING MD:  Dr. Damita Dunnings, CHIEF COMPLAINT:  Rectal Bleeding  History of Present Illness:  66 yo F presenting to [facility] ED from home on 04/03/22 with complaints of at least 3 bloody stools per rectum prior to arrival.  Previous colonoscopy showed diverticulosis and of note she has a history of diverticular bleeding. She underwent angiography and microbead embolization of the colic and sigmoid arteries on 01/23/22.   ED course: Exam revealed hematochezia on rectal exam, VSS with initial Hgb 12. TRH consulted for admission, GI consultation.  Hospital course: Patient started on a Protonix drip, Hgb dropped from 12.8 to 9.2 with continued hematochezia. Overnight 9/15/-9/16 CT angio GIB scan was negative. 9/16: NM bleeding scan performed and positive. She received 1 unit PRBC's.  Patient transferred to SDU for closer monitoring. 9/17: IR arrived urgently to perform coil embolization. Overnight patient had recurrence of several episodes of hematochezia. CT angio GIB re-demonstrated active bleeding in the same area that was embolized earlier by IR. Hgb dropped to 6.9. Patient then had a significant episode of hematochezia leading to hypotension and pre-syncopal episode. Blood transfusion started and peripheral vasopressors. IR was consulted, who recommended General Surgery intervention for resection.  Significant labs: (Labs/ Imaging personally reviewed) Chemistry unremarkable,  Hgb trend: 12.3 > 11 > 10.4 > 9.3 > 8.8 > 8.0 > 6.9  CT angio GIB 04/05/22: Active bleeding identified within the distal transverse colon. No focal bowel wall thickening or inflammation. No other acute aortic pathology identified. No other acute localizing process in the abdomen or pelvis.  PCCM consulted for assistance in management and monitoring due to vasopressor requirement in the setting of active bleeding  in the distal transverse colon and splenic flexure.  Pertinent  Medical History  Diverticulosis with history of diverticular bleed (01/2022) COVID-19 (07/2020, 04/2021) Asthma HLD HTN Hiatal Hernia GERD  Significant Hospital Events: Including procedures, antibiotic start and stop dates in addition to other pertinent events   9/15/-9/16 CT angio GIB scan was negative. 9/16: NM bleeding scan performed and positive. She received 1 unit PRBC's.  Patient transferred to SDU for closer monitoring. 9/17: IR arrived urgently to perform coil embolization. Overnight patient had recurrence of significant hematochezia leading to hypotension and pre-syncopal episode. Blood transfusion started and peripheral vasopressors. PCCM consulted for assistance in management and monitoring.  Interim History / Subjective:  Patient lethargic but A&O with husband bedside. She reports 8/10 continued back pain since her procedure earlier with IR. Initially, she complained of RLQ pain on arrival to ED, which she said had resolved post embolization.  Objective   Blood pressure (!) 84/52, pulse (!) 109, temperature 97.8 F (36.6 C), resp. rate 15, height 5\' 4"  (1.626 m), weight 99.6 kg, SpO2 98 %.        Intake/Output Summary (Last 24 hours) at 04/06/2022 0011 Last data filed at 04/05/2022 1832 Gross per 24 hour  Intake 1530.68 ml  Output 1 ml  Net 1529.68 ml   Filed Weights   04/03/22 0838 04/04/22 2010  Weight: 86.2 kg 99.6 kg    Examination: General: Adult female, critically ill, lying in bed, NAD HEENT: MM pink/moist, anicteric, atraumatic, neck supple Neuro: A&O x 3, able to follow commands, PERRL +3, MAE CV: s1s2 RRR, ST on monitor, no r/m/g Pulm: Regular, non labored on 2 L Killian, breath sounds clear-BUL & diminished-BLL GI: soft, rounded, non tender, bs x  4 Skin: no rashes/lesions noted (no bruising noted on back) Extremities: warm/dry, pulses + 2 R/P, no edema noted  Resolved Hospital Problem list      Assessment & Plan:  Hematochezia secondary to active bleeding in the distal transverse colon in the setting of diverticulosis Circulatory Shock PMHx: Diverticulosis, previous diverticular bleeding requiring embolization Patient has stabilized with blood & IVF, weaning pressor support down - agree with transfusion of 3 units PRBC's & LR bolus - continue Protonix IV BID - Maintain up to date type & screen - continuous cardiac monitoring - NPO - Monitor for s/s of bleeding - Daily CBC, PT/ INR monitoring PRN - Monitor H&H Q 6, Transfuse for Hgb <7 - GI consulted, appreciate input, general surgery following - continue levophed drip PRN to maintain MAP > 65 - agree with holding diltiazem   Asthma - continue home regimen  Best Practice (right click and "Reselect all SmartList Selections" daily)  Diet/type: NPO DVT prophylaxis: SCD GI prophylaxis: PPI Lines: N/A Foley:  N/A Code Status:  full code Last date of multidisciplinary goals of care discussion [per primary]  Labs   CBC: Recent Labs  Lab 04/03/22 0910 04/03/22 1400 04/05/22 0052 04/05/22 0903 04/05/22 1037 04/05/22 1734 04/05/22 2111  WBC 9.1  --   --   --   --   --   --   HGB 12.3   < > 9.2* 8.9* 8.8* 8.0* 6.9*  HCT 35.8*   < > 27.2* 26.1* 25.9* 23.5* 20.0*  MCV 76.8*  --   --   --   --   --   --   PLT 258  --   --   --   --   --   --    < > = values in this interval not displayed.    Basic Metabolic Panel: Recent Labs  Lab 04/03/22 0910 04/04/22 1211 04/05/22 0437  NA 140 141 141  K 3.7 3.8 3.6  CL 105 109 110  CO2 27 22 25   GLUCOSE 109* 93 104*  BUN 10 10 11   CREATININE 0.84 0.82 0.88  CALCIUM 9.2 8.7* 8.7*  MG  --   --  1.9   GFR: Estimated Creatinine Clearance: 71.2 mL/min (by C-G formula based on SCr of 0.88 mg/dL). Recent Labs  Lab 04/03/22 0910  WBC 9.1    Liver Function Tests: Recent Labs  Lab 04/03/22 0910  AST 19  ALT 18  ALKPHOS 62  BILITOT 0.6  PROT 8.0  ALBUMIN 3.9    No results for input(s): "LIPASE", "AMYLASE" in the last 168 hours. No results for input(s): "AMMONIA" in the last 168 hours.  ABG No results found for: "PHART", "PCO2ART", "PO2ART", "HCO3", "TCO2", "ACIDBASEDEF", "O2SAT"   Coagulation Profile: No results for input(s): "INR", "PROTIME" in the last 168 hours.  Cardiac Enzymes: No results for input(s): "CKTOTAL", "CKMB", "CKMBINDEX", "TROPONINI" in the last 168 hours.  HbA1C: Hgb A1c MFr Bld  Date/Time Value Ref Range Status  10/10/2021 08:21 AM 6.4 4.6 - 6.5 % Final    Comment:    Glycemic Control Guidelines for People with Diabetes:Non Diabetic:  <6%Goal of Therapy: <7%Additional Action Suggested:  >8%   07/08/2021 11:35 AM 6.0 4.6 - 6.5 % Final    Comment:    Glycemic Control Guidelines for People with Diabetes:Non Diabetic:  <6%Goal of Therapy: <7%Additional Action Suggested:  >8%     CBG: Recent Labs  Lab 04/04/22 2015  GLUCAP 105*    Review of  Systems: Positives in BOLD  Gen: Denies fever, chills, weight change, fatigue, night sweats HEENT: Denies blurred vision, double vision, hearing loss, tinnitus, sinus congestion, rhinorrhea, sore throat, neck stiffness, dysphagia PULM: Denies shortness of breath, cough, sputum production, hemoptysis, wheezing CV: Denies chest pain, edema, orthopnea, paroxysmal nocturnal dyspnea, palpitations GI: Denies abdominal pain, nausea, vomiting, diarrhea, hematochezia, melena, constipation, change in bowel habits GU: Denies dysuria, hematuria, polyuria, oliguria, urethral discharge Endocrine: Denies hot or cold intolerance, polyuria, polyphagia or appetite change Derm: Denies rash, dry skin, scaling or peeling skin change Heme: Denies easy bruising, bleeding, bleeding gums Neuro: Denies headache, numbness, weakness, slurred speech, loss of memory or consciousness  Past Medical History:  She,  has a past medical history of Arthritis, Asthma, COVID, COVID-19, Diverticulitis, GERD  (gastroesophageal reflux disease), GIB (gastrointestinal bleeding), Hiatal hernia, History of blood transfusion, History of chicken pox, History of GI bleed, Hyperlipidemia, Hypertension, Leukocytosis, and Type O blood, Rh positive.   Surgical History:   Past Surgical History:  Procedure Laterality Date   BREAST CYST ASPIRATION Right    CESAREAN SECTION     04/27/84   COLONOSCOPY     COLONOSCOPY WITH PROPOFOL N/A 02/27/2022   Procedure: COLONOSCOPY WITH PROPOFOL;  Surgeon: [name], MD;  Location: [facility] ENDOSCOPY;  Service: Gastroenterology;  Laterality: N/A;   EMBOLIZATION N/A 01/23/2022   Procedure: EMBOLIZATION;  Surgeon: [name], MD;  Location: [facility] INVASIVE CV LAB;  Service: Cardiovascular;  Laterality: N/A;   ESOPHAGOGASTRODUODENOSCOPY (EGD) WITH PROPOFOL N/A 08/29/2020   Procedure: ESOPHAGOGASTRODUODENOSCOPY (EGD) WITH PROPOFOL;  Surgeon: [name], MD;  Location: [facility] ENDOSCOPY;  Service: Gastroenterology;  Laterality: N/A;   IR ANGIOGRAM VISCERAL SELECTIVE  04/05/2022   IR ANGIOGRAM VISCERAL SELECTIVE  04/05/2022   IR EMBO ART  VEN HEMORR LYMPH EXTRAV  INC GUIDE ROADMAPPING  04/05/2022   IR US GUIDE VASC ACCESS RIGHT  04/05/2022   ORIF ACETABULAR FRACTURE       Social History:   reports that she has never smoked. She has never used smokeless tobacco. She reports that she does not currently use alcohol. She reports that she does not currently use drugs.   Family History:  Her family history includes Diabetes in her maternal aunt; Hypertension in her maternal aunt. There is no history of Breast cancer.   Allergies Allergies  Allergen Reactions   Oxycodone Other (See Comments)    Feels bad   Dilaudid [Hydromorphone] Other (See Comments)    Too drowsy     Home Medications  Prior to Admission medications   Medication Sig Start Date End Date Taking? Authorizing Provider  atorvastatin (LIPITOR) 10 MG tablet TAKE 1 TABLET (10 MG TOTAL) BY MOUTH DAILY  AT 6 PM. 09/04/21  Yes [name], [name], MD  citalopram (CELEXA) 10 MG tablet Take 1 tablet (10 mg total) by mouth daily. 10/14/21  Yes [name], [name], MD  clotrimazole-betamethasone (LOTRISONE) cream Apply 1 Application topically 2 (two) times daily. 03/25/22  Yes McDonald, Rachelle Hora, DPM  diltiazem (CARDIZEM CD) 120 MG 24 hr capsule Take 1 capsule (120 mg total) by mouth daily. qhs 01/14/22  Yes [name], [name], MD  diltiazem (CARDIZEM CD) 240 MG 24 hr capsule Take 1 capsule (240 mg total) by mouth daily. In am 01/14/22  Yes [name], [name], MD  montelukast (SINGULAIR) 10 MG tablet TAKE 1 TABLET BY MOUTH EVERYDAY AT BEDTIME Patient taking differently: Take 10 mg by mouth at bedtime. 09/15/21  Yes [name], [name], MD  omeprazole (PRILOSEC) 40 MG capsule TAKE 1 CAPSULE BY MOUTH TWICE A DAY 02/08/22  Yes Eulis Foster, FNP  Specialty Vitamins Products (WOMENS VITA PAK PO) Take by mouth daily.   Yes [provider]  terbinafine (LAMISIL) 250 MG tablet Take 1 tablet (250 mg total) by mouth daily. 03/25/22 06/23/22 Yes McDonald, Rachelle Hora, DPM  albuterol (VENTOLIN HFA) 108 (90 Base) MCG/ACT inhaler INHALE 1-2 PUFFS BY MOUTH EVERY 6 (SIX) HOURS AS NEEDED 01/14/22   [name], [name], MD  fluticasone (FLONASE) 50 MCG/ACT nasal spray SPRAY 2 SPRAYS INTO EACH NOSTRIL EVERY DAY Patient taking differently: Place 2 sprays into both nostrils daily as needed for allergies or rhinitis. 09/09/21   [name], [name], MD     Critical care time: 57 minutes    Betsey Holiday, AGACNP-BC Acute Care Nurse Practitioner [facility]   [phone] / [phone] Please see Amion for pager details.

## 2022-04-06 NOTE — Progress Notes (Signed)
Contacted by the primary service again this morning to consider endovascular intervention on this patient.  Patient has developed further bleeding overnight with a new CTA positive for active bleeding after attempted embolization yesterday by IR.  Vascular was originally consulted but Dr. Lorenso Courier who was covering yesterday did not do embolizations.  Dr. Delana Meyer has previously seen the patient for an embolization in a different area in the past.  Patient has had further active lower GI bleeding and with a new CTA positive for active bleeding after coil embolization. At this point, an attempt at embolization to the area with microbleeds rather than a coil would be appropriate at this point.  If she fails this attempted embolization, colectomy should be considered in general surgery has seen the patient.  She is also going to be at somewhat higher risk of ischemia with repeat embolization, but we will try to limit our polyvinyl alcohol bead use to limit the risk of ischemia.

## 2022-04-06 NOTE — Op Note (Signed)
[facility]  Percutaneous Study/Intervention Procedural Note     Surgeon(s): [employer]   Assistants: none  Pre-operative Diagnosis: 1. Lower GI bleed   Post-operative diagnosis:  Same  Procedure(s) Performed:             1.  Ultrasound guidance for vascular access right femoral artery             2.  Catheter placement into distal transverse colonic branch of the SMA and then into the splenic flexure branch and proximal left colonic branches of the IMA             3.  Aortogram and selective angiogram of the SMA including selective imaging of a distal transverse colonic branch and selective angiogram of the IMA and then selective imaging of splenic flexure and proximal left colonic branches of the IMA             4.  Microbead embolization of distal transverse colonic branch of the SMA with 0.5 cc of 500-700  polyvinyl alcohol beads.             5.  Microbead embolization of a splenic flexure branch and of the proximal left colonic branch also feeding the splenic flexure with 0.5 cc of 500 700 m polyvinyl alcohol beads in each branch  6.  StarClose closure device right femoral artery  Anesthesia: Moderate conscious sedation for approximately 32 minutes using 2 mg of Versed and 50 mcg of Fentanyl              EBL: 5 cc  Fluoro Time: 8.9 minutes  Contrast: 45 cc              Indications:  Patient is a 66 y.o.female with brisk lower GI bleeding with resultant anemia. The patient has a previous coil embolization performed with continued bleeding and a CT angiogram demonstrating active extravasation after this coil embolization procedure.  The patient is brought in for angiography for further evaluation and potential treatment. Risks and benefits are discussed and informed consent is obtained  Procedure:  The patient was identified and appropriate procedural time out was performed.  The patient was then placed supine on the table and prepped and draped in the  usual sterile fashion. Moderate conscious sedation was administered during a face to face encounter with the patient throughout the procedure with my supervision of the RN administering medicines and monitoring the patient's vital signs, pulse oximetry, telemetry and mental status throughout from the start of the procedure until the patient was taken to the recovery room.  Ultrasound was used to evaluate the right common femoral artery.  It was patent .  A digital ultrasound image was acquired.  A Seldinger needle was used to access the right common femoral artery under direct ultrasound guidance and a permanent image was performed.  A 0.035 J wire was advanced without resistance and a 5Fr sheath was placed.  Pigtail catheter was placed into the aorta and an AP aortogram was performed. This demonstrated normal aorta and iliac arteries without significant stenosis.  There appeared to be normal flow in both renal arteries although there is some significant tortuosity in the proximal left renal artery.  All 3 visceral vessels showed flow but the origins were not easily seen initially on AP projection. A V S1 catheter was used to selectively cannulate the SMA.  This demonstrated normal branching pattern.  The artery that had been treated previously with coil embolization was identified and  I initially cannulated and imaged it, but there was a branch just below this that was also tracking to the distal transverse colon and splenic flexure area and so I cannulated this and perform selective imaging and then instilled a total of 0.5 cc of polyvinyl alcohol beads into this artery. Based on her continued bleeding and the CT angiogram study I elected to treat this area with embolization and then instilled a total of 0.5 cc of polyvinyl alcohol beads into this artery.  The flow to the area was less brisk and so then I turned my attention to cannulating the IMA and treating the area this way which would likely give me direct  access to the splenic flexure better after the coil embolization on the SMA had already been performed.  I then cannulated the IMA with slight RAO projection without difficulty with a V S1 catheter and perform selective imaging.  The large superior branch had a bifurcation with 1 branch going to the distal transverse colon and then feeding around the arcade into the splenic flexure and proximal left colon.  The other branch was going to the proximal left colon and then going through the arcades to the splenic flexure.  I initially advanced the Pro-Great microcatheter out the branch going to the distal transverse colon and splenic flexure and instilled approximately 0.5 cc of 500 to 700 m polyvinyl alcohol beads in this location. Angiogram following this showed the main vessels to be open with less brisk filling. I then pulled back and cannulated the branch that started in the left colon and then fed up to the splenic flexure with the Pro-great microcatheter without difficulty. Selective imaging was performed which showed continued flow to this area.  An additional 0.5 cc of 500 to 700 m polyvinyl alcohol beads were deployed in the proximal left colonic branch. Again, completion angiogram showed the main vessels to be open with less brisk filling.  The microcatheter was removed and selective imaging through the V S1 and the IMA showed more sluggish flow now to the area in question although there was still filling in a delayed fashion.  I elected to terminate the procedure. The diagnostic catheter was removed. StarClose closure device was deployed in usual fashion with excellent hemostatic result. The patient was taken to the recovery room in stable condition having tolerated the procedure well.      Disposition: Patient was taken to the recovery room in stable condition having tolerated the procedure well.  Complications:  None  Leotis Pain 04/06/2022 9:42 AM   This note was created with Dragon Medical  transcription system. Any errors in dictation are purely unintentional.

## 2022-04-06 NOTE — Assessment & Plan Note (Signed)
Resolved after receiving 2 units of packed red blood cells the other night.

## 2022-04-06 NOTE — Progress Notes (Signed)
Interventional Radiology Progress Note  Discussed case with Dr. Damita Dunnings tonight after further episodes of bloody stools after arteriography and embolization which was completed just after 5 PM.  CTA just after 11 PM shows focal area of bleeding in distal transverse colon at splenic flexure. Embolization coil is in very close proximity to the bleed at the level of superior left colic arcade. Given sporadic nature of bleeding and lack of visualization with arteriography earlier today and based on arteriographic anatomy, this bleed may be very difficult to define and treat with repeated attempts at catheter interventions.   The patient did not actually get any blood today, so plan is for patient to get 2 unit transfusion now.   Recommend general surgery consultation to get opinion regarding colectomy given that the bleeding site has been localized to the distal transverse colon now by bleeding scan yesterday and CTA tonight.   If surgery or colonoscopy are not options, repeat arteriography could be considered to try to further pinpoint bleeding vessel(s).  Venetia Night. Kathlene Cote, M.D Pager:  [phone]

## 2022-04-06 NOTE — Assessment & Plan Note (Signed)
Replaced. °

## 2022-04-06 NOTE — Hospital Course (Signed)
66 year old female past medical history of arthritis, asthma, GERD, hyperlipidemia, tachycardia and hypertension.  She recently had a diverticular bleed requiring embolization to the descending colon and sigmoid colon.  He came back with GI bleed.  Initial CT angio was negative for bleeding.  Bleeding scan on 04/04/2022 was positive after 75 minutes in the distal transverse colon.  Patient was given 1 unit of packed red blood cells on 04/04/2022.  With continued bleeding the patient did have a coil embolization by Dr. Kathlene Cote on 04/05/2022.  The patient continued to bleed and became hypotensive requiring 2 units of packed red blood cells on the evening of 04/05/2022.  General surgery was consulted.  Dr. Lucky Cowboy vascular surgery took for microbleed embolization of the colonic branch of the SMA and splenic flexure branch on 04/06/2022.

## 2022-04-06 NOTE — Consult Note (Signed)
[facility] SURG[facility] CONSULTATION NOTE (initial) - cpt: 99255   HISTORY OF PRESENT ILLNESS (HPI):  66 y.o. female presented to [facility] ED on 9/15 for evaluation of repeat lower GI bleeding.  She had a history of prior lower GI bleeding, extensive diverticulosis seen on last colonoscopy in August.  She is undergone angiography and embolization of distal descending and sigmoid colon successfully in July.   Patient reports essentially having no further symptoms until this admission. Dr. Fredia Sorrow proceeded with embolization with coil earlier in the evening, however she was not actively bleeding at that time.  Repeat CTA this evening after large bloody bowel movements shows a focal area in the distal transverse colon at the splenic flexure.  She is currently getting her second unit of packed red blood cells for hypotension with tachycardia and hemoglobin currently at 6.9.  She has been transferred from stepdown to the ICU for assistance with vasopressors if necessary. Surgery is consulted by hospitalist physician Dr. Para March in this context for evaluation and surgical management of left colonic hemorrhage. Marland Kitchen  PAST MEDICAL HISTORY (PMH):  Past Medical History:  Diagnosis Date   Arthritis    Asthma    COVID    COVID-19    07/24/20, 05/19/21   Diverticulitis    GERD (gastroesophageal reflux disease)    GIB (gastrointestinal bleeding)    diverticular 01/23/22-01/24/22 [facility] s/p emobolization & 15 years ago in [place]   Hiatal hernia    History of blood transfusion    History of chicken pox    History of GI bleed    2013/14   Hyperlipidemia    Hypertension    Leukocytosis    Type O blood, Rh positive      PAST SURGICAL HISTORY (PSH):  Past Surgical History:  Procedure Laterality Date   BREAST CYST ASPIRATION Right    CESAREAN SECTION     04/27/84   COLONOSCOPY     COLONOSCOPY WITH PROPOFOL N/A 02/27/2022   Procedure: COLONOSCOPY WITH PROPOFOL;  Surgeon: [name], MD;   Location: [facility] ENDOSCOPY;  Service: Gastroenterology;  Laterality: N/A;   EMBOLIZATION N/A 01/23/2022   Procedure: EMBOLIZATION;  Surgeon: [name], MD;  Location: [facility] INVASIVE CV LAB;  Service: Cardiovascular;  Laterality: N/A;   ESOPHAGOGASTRODUODENOSCOPY (EGD) WITH PROPOFOL N/A 08/29/2020   Procedure: ESOPHAGOGASTRODUODENOSCOPY (EGD) WITH PROPOFOL;  Surgeon: [name], MD;  Location: [facility] ENDOSCOPY;  Service: Gastroenterology;  Laterality: N/A;   IR ANGIOGRAM VISCERAL SELECTIVE  04/05/2022   IR ANGIOGRAM VISCERAL SELECTIVE  04/05/2022   IR EMBO ART  VEN HEMORR LYMPH EXTRAV  INC GUIDE ROADMAPPING  04/05/2022   IR US GUIDE VASC ACCESS RIGHT  04/05/2022   ORIF ACETABULAR FRACTURE       MEDICATIONS:  Prior to Admission medications   Medication Sig Start Date End Date Taking? Authorizing Provider  atorvastatin (LIPITOR) 10 MG tablet TAKE 1 TABLET (10 MG TOTAL) BY MOUTH DAILY AT 6 PM. 09/04/21  Yes [name], [name], MD  citalopram (CELEXA) 10 MG tablet Take 1 tablet (10 mg total) by mouth daily. 10/14/21  Yes [name], [name], MD  clotrimazole-betamethasone (LOTRISONE) cream Apply 1 Application topically 2 (two) times daily. 03/25/22  Yes McDonald, Rachelle Hora, DPM  diltiazem (CARDIZEM CD) 120 MG 24 hr capsule Take 1 capsule (120 mg total) by mouth daily. qhs 01/14/22  Yes [name], [name], MD  diltiazem (CARDIZEM CD) 240 MG 24 hr capsule Take 1 capsule (240 mg total) by mouth daily. In am  01/14/22  Yes [name], [name], MD  montelukast (SINGULAIR) 10 MG tablet TAKE 1 TABLET BY MOUTH EVERYDAY AT BEDTIME Patient taking differently: Take 10 mg by mouth at bedtime. 09/15/21  Yes [name], [name], MD  omeprazole (PRILOSEC) 40 MG capsule TAKE 1 CAPSULE BY MOUTH TWICE A DAY 02/08/22  Yes Eulis Foster, FNP  Specialty Vitamins Products (WOMENS VITA PAK PO) Take by mouth daily.   Yes [provider]  terbinafine (LAMISIL) 250 MG tablet Take 1 tablet  (250 mg total) by mouth daily. 03/25/22 06/23/22 Yes McDonald, Rachelle Hora, DPM  albuterol (VENTOLIN HFA) 108 (90 Base) MCG/ACT inhaler INHALE 1-2 PUFFS BY MOUTH EVERY 6 (SIX) HOURS AS NEEDED 01/14/22   [name], [name], MD  fluticasone (FLONASE) 50 MCG/ACT nasal spray SPRAY 2 SPRAYS INTO EACH NOSTRIL EVERY DAY Patient taking differently: Place 2 sprays into both nostrils daily as needed for allergies or rhinitis. 09/09/21   [name], [name], MD     ALLERGIES:  Allergies  Allergen Reactions   Oxycodone Other (See Comments)    Feels bad   Dilaudid [Hydromorphone] Other (See Comments)    Too drowsy     SOCIAL HISTORY:  Social History   Socioeconomic History   Marital status: Married    Spouse name: Not on file   Number of children: Not on file   Years of education: Not on file   Highest education level: Not on file  Occupational History   Not on file  Tobacco Use   Smoking status: Never   Smokeless tobacco: Never  Vaping Use   Vaping Use: Never used  Substance and Sexual Activity   Alcohol use: Not Currently   Drug use: Not Currently   Sexual activity: Yes    Comment: husband   Other Topics Concern   Not on file  Social History Narrative   Lived in [place] from [place]    Married 1st husband died in late 90s/2000s   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, wears seat belt, safe in relationship    Social Determinants of Health   Financial Resource Strain: Not on file  Food Insecurity: No Food Insecurity (02/12/2022)   Hunger Vital Sign    Worried About Running Out of Food in the Last Year: Never true    Ran Out of Food in the Last Year: Never true  Transportation Needs: Not on file  Physical Activity: Not on file  Stress: Not on file  Social Connections: Not on file  Intimate Partner Violence: Not At Risk (02/12/2022)   Humiliation, Afraid, Rape, and Kick questionnaire    Fear of Current or Ex-Partner: No    Emotionally Abused: No    Physically Abused: No    Sexually  Abused: No     FAMILY HISTORY:  Family History  Problem Relation Age of Onset   Hypertension Maternal Aunt    Diabetes Maternal Aunt    Breast cancer Neg Hx        VITAL SIGNS:  Temp:  [97.8 F (36.6 C)-98.7 F (37.1 C)] 98.2 F (36.8 C) (09/18 0015) Pulse Rate:  [0-109] 92 (09/18 0100) Resp:  [7-29] 18 (09/18 0100) BP: (75-155)/(52-84) 131/64 (09/18 0100) SpO2:  [91 %-100 %] 100 % (09/18 0100)     Height:  (162.6 cm) Weight: 99.6 kg BMI (Calculated): 37.67   INTAKE/OUTPUT:  09/17 0701 - 09/18 0700 In: 421.8 [I.V.:158.9; IV Piggyback:262.8] Out: -   PHYSICAL EXAM:  Physical Exam Blood pressure 131/64, pulse 92, temperature 98.2 F (36.8 C), temperature source Oral, resp. rate 18, height 5\' 4"  (1.626 m), weight 99.6 kg, SpO2 100 %. Last Weight  Most recent update: 04/04/2022  9:06 PM    Weight  99.6 kg (219 lb 9.3 oz)             CONSTITUTIONAL: Well developed, and nourished, appropriately responsive and aware without distress.   EYES: Sclera non-icteric.   EARS, NOSE, MOUTH AND THROAT:  The oropharynx is clear. Oral mucosa is pink and moist.    Hearing is intact to voice.  NECK: Trachea is midline.  No appreciable neck masses or lymphadenopathy RESPIRATORY:  Normal respiratory effort without pathologic use of accessory muscles. CARDIOVASCULAR: Heart is regular in rate and rhythm. GI: The abdomen is soft, nontender, and nondistended. MUSCULOSKELETAL:  Symmetrical muscle tone appreciated in all four extremities.    SKIN: Skin turgor is normal. No pathologic skin lesions appreciated.  NEUROLOGIC:  Motor and sensation appear grossly normal.  Cranial nerves are grossly without defect. PSYCH:  Alert and oriented to person, place and time. Affect is appropriate for situation.  Data Reviewed I have personally reviewed what is currently available of the patient's imaging, recent labs and medical records.    Labs:     Latest Ref Rng & Units 04/05/2022    9:11 PM  04/05/2022    5:34 PM 04/05/2022   10:37 AM  CBC  Hemoglobin 12.0 - 15.0 g/dL 6.9  8.0  8.8   Hematocrit 36.0 - 46.0 % 20.0  23.5  25.9       Latest Ref Rng & Units 04/05/2022    4:37 AM 04/04/2022   12:11 PM 04/03/2022    9:10 AM  CMP  Glucose 70 - 99 mg/dL 104  93  109   BUN 8 - 23 mg/dL 11  10  10    Creatinine 0.44 - 1.00 mg/dL 0.88  0.82  0.84   Sodium 135 - 145 mmol/L 141  141  140   Potassium 3.5 - 5.1 mmol/L 3.6  3.8  3.7   Chloride 98 - 111 mmol/L 110  109  105   CO2 22 - 32 mmol/L 25  22  27    Calcium 8.9 - 10.3 mg/dL 8.7  8.7  9.2   Total Protein 6.5 - 8.1 g/dL   8.0   Total Bilirubin 0.3 - 1.2 mg/dL   0.6   Alkaline Phos 38 - 126 U/L   62   AST 15 - 41 U/L   19   ALT 0 - 44 U/L   18      Imaging studies:   Last 24 hrs: CT ANGIO GI BLEED  Result Date: 04/05/2022 CLINICAL DATA:  Lower GI bleeding EXAM: CTA ABDOMEN AND PELVIS WITHOUT AND WITH CONTRAST TECHNIQUE: Multidetector CT imaging of the abdomen and pelvis was performed using the standard protocol during bolus administration of intravenous contrast. Multiplanar reconstructed images and MIPs were obtained and reviewed to evaluate the vascular anatomy. RADIATION DOSE REDUCTION: This exam was performed according to the departmental dose-optimization program which includes automated exposure control, adjustment of the mA and/or kV according to patient size and/or use of iterative reconstruction technique. CONTRAST:  17mL OMNIPAQUE IOHEXOL 350 MG/ML SOLN COMPARISON:  CTA GI bleed 04/04/2022. Nuclear medicine GI bleeding scan 04/04/2022 FINDINGS: VASCULAR Aorta: Normal caliber aorta without aneurysm, dissection, vasculitis or significant stenosis. Celiac: Patent without evidence of aneurysm, dissection, vasculitis or significant stenosis. SMA:  Patent without evidence of aneurysm, dissection, vasculitis or significant stenosis. Renals: Both renal arteries are patent without evidence of aneurysm, dissection, vasculitis,  fibromuscular dysplasia or significant stenosis. IMA: Patent without evidence of aneurysm, dissection, vasculitis or significant stenosis. Inflow: Patent without evidence of aneurysm, dissection, vasculitis or significant stenosis. Proximal Outflow: Bilateral common femoral and visualized portions of the superficial and profunda femoral arteries are patent without evidence of aneurysm, dissection, vasculitis or significant stenosis. Veins: No obvious venous abnormality within the limitations of this arterial phase study. Review of the MIP images confirms the above findings. NON-VASCULAR Lower chest: No acute abnormality. Hepatobiliary: Gallstones are present. There is no biliary ductal dilatation. The liver is within normal limits. Pancreas: Unremarkable. No pancreatic ductal dilatation or surrounding inflammatory changes. Spleen: Normal in size without focal abnormality. Adrenals/Urinary Tract: Adrenal glands are unremarkable. Kidneys are normal, without renal calculi, focal lesion, or hydronephrosis. Posterior bladder diverticula appear unchanged. There is contrast throughout the bladder. Stomach/Bowel: There is active bleeding seen within the distal transverse colon corresponding to findings from nuclear medicine study. There is no focal wall thickening or inflammation in this region. There are scattered colonic diverticula. No bowel obstruction. The appendix, small bowel and stomach are within normal limits. Lymphatic: No enlarged lymph nodes are identified. Reproductive: Uterus and bilateral adnexa are unremarkable. Other: No abdominal wall hernia or abnormality. No abdominopelvic ascites. Musculoskeletal: No acute or significant osseous findings. IMPRESSION: VASCULAR 1. Active bleeding identified within the distal transverse colon. No focal bowel wall thickening or inflammation. 2. No other acute aortic pathology identified. NON-VASCULAR 1. No other acute localizing process in the abdomen or pelvis. 2. Colonic  diverticulosis. 3. Bladder diverticula. 4. Cholelithiasis. These results were called by telephone at the time of interpretation on 04/05/2022 at 11:42 pm to provider [facility] , who verbally acknowledged these results. Electronically Signed   By: Darliss Cheney M.D.   On: 04/05/2022 23:42   IR Angiogram Visceral Selective  Result Date: 04/05/2022 INDICATION: GI bleed with positive nuclear medicine bleeding scan at the level of the distal transverse colon/splenic flexure. EXAM: 1. ULTRASOUND GUIDANCE FOR VASCULAR ACCESS OF THE RIGHT COMMON FEMORAL ARTERY 2. VISCERAL ARTERIOGRAPHY OF THE INFERIOR MESENTERIC ARTERY 3. ADDITIONAL SELECTIVE ARTERIOGRAPHY OF THE LEFT COLIC ARTERY, SECOND ORDER 4. ADDITIONAL SELECTIVE ARTERIOGRAPHY OF A LEFT COLIC ARTERY BRANCH, THIRD ORDER 5. TRANSCATHETER EMBOLIZATION OF LEFT COLIC ARTERY BRANCH 6. VISCERAL ARTERIOGRAPHY OF THE SUPERIOR MESENTERIC ARTERY MEDICATIONS: NONE ANESTHESIA/SEDATION: Moderate (conscious) sedation was employed during this procedure. A total of Versed 2.0 mg and Fentanyl 100 mcg was administered intravenously by radiology nursing. Moderate Sedation Time: 73 minutes. The patient's level of consciousness and vital signs were monitored continuously by radiology nursing throughout the procedure under my direct supervision. CONTRAST:  40 mL Visipaque 320 FLUOROSCOPY TIME:  Radiation Exposure Index (as provided by the fluoroscopic device): 977 mGy Kerma COMPLICATIONS: None immediate. PROCEDURE: Informed consent was obtained from the patient following explanation of the procedure, risks, benefits and alternatives. The patient understands, agrees and consents for the procedure. All questions were addressed. A time out was performed prior to the initiation of the procedure. Maximal barrier sterile technique utilized including caps, mask, sterile gowns, sterile gloves, large sterile drape, hand hygiene, and chlorhexidine prep. Ultrasound was used to confirm patency of  the right common femoral artery. A permanent ultrasound image was recorded. Under direct ultrasound guidance, access of the right common femoral artery was performed with a micropuncture set. A 5 French vascular sheath was placed. A 5  [place] VS 1 catheter was used to selectively catheterize the inferior mesenteric artery. Selective arteriography of the IMA supply was performed. A microcatheter was then introduced via the 5 French catheter and used to selectively catheterize the left colic artery. Selective arteriography was performed. The microcatheter was then advanced into a superior branch of the left colic artery and additional selective arteriography performed. Transcatheter embolization of a superior branch of the left colic artery was then performed with deployment of a single 2 mm diameter Ruby micro coil. After coil deployment, additional arteriography was performed through the microcatheter. The microcatheter and VS1 catheter were removed. A 5 French Cobra catheter was then advanced into the abdominal aorta and used to selectively catheterize the superior mesenteric artery. The catheter was advanced into the proximal trunk of the SMA and selective arteriography performed. The Cobra catheter was removed. Arteriotomy closure was performed with the Angio-Seal device. FINDINGS: IMA arteriography demonstrates no active contrast extravasation, arteriovenous malformation or arterial pseudoaneurysms in the IMA distribution. Selective arteriography at the level of the left colic artery demonstrates supply to the distal transverse colon and splenic flexure. Additional selective arteriography was then performed of a superior branch supplying the distal transverse colon and directed towards cross flow with SMA supply. Given positive bleeding scan at this level, decision was made to place a microcoil to reduce cross flow from the SMA and perfusion pressure to the distal transverse colon in hopes diminishing further  bleeding. Post embolization arteriography demonstrates reduced flow without arterial compromise to the colon. SMA arteriography demonstrates no evidence of active bleeding, arteriovenous malformation or other vascular abnormality. IMPRESSION: Arteriography performed as above, focused to the level of the distal transverse colon and splenic flexure which is supplied by branches of the left colic artery. A single embolization coil was placed in a superior branch of the left colic artery supplying the distal transverse colon and directed towards cross flow with SMA supply to reduce perfusion pressure and try to decrease recurrent bleeding. Electronically Signed   By: Irish LackGlenn  Yamagata M.D.   On: 04/05/2022 20:02   IR US Guide Vasc Access Right  Result Date: 04/05/2022 INDICATION: GI bleed with positive nuclear medicine bleeding scan at the level of the distal transverse colon/splenic flexure. EXAM: 1. ULTRASOUND GUIDANCE FOR VASCULAR ACCESS OF THE RIGHT COMMON FEMORAL ARTERY 2. VISCERAL ARTERIOGRAPHY OF THE INFERIOR MESENTERIC ARTERY 3. ADDITIONAL SELECTIVE ARTERIOGRAPHY OF THE LEFT COLIC ARTERY, SECOND ORDER 4. ADDITIONAL SELECTIVE ARTERIOGRAPHY OF A LEFT COLIC ARTERY BRANCH, THIRD ORDER 5. TRANSCATHETER EMBOLIZATION OF LEFT COLIC ARTERY BRANCH 6. VISCERAL ARTERIOGRAPHY OF THE SUPERIOR MESENTERIC ARTERY MEDICATIONS: NONE ANESTHESIA/SEDATION: Moderate (conscious) sedation was employed during this procedure. A total of Versed 2.0 mg and Fentanyl 100 mcg was administered intravenously by radiology nursing. Moderate Sedation Time: 73 minutes. The patient's level of consciousness and vital signs were monitored continuously by radiology nursing throughout the procedure under my direct supervision. CONTRAST:  40 mL Visipaque 320 FLUOROSCOPY TIME:  Radiation Exposure Index (as provided by the fluoroscopic device): 977 mGy Kerma COMPLICATIONS: None immediate. PROCEDURE: Informed consent was obtained from the patient following  explanation of the procedure, risks, benefits and alternatives. The patient understands, agrees and consents for the procedure. All questions were addressed. A time out was performed prior to the initiation of the procedure. Maximal barrier sterile technique utilized including caps, mask, sterile gowns, sterile gloves, large sterile drape, hand hygiene, and chlorhexidine prep. Ultrasound was used to confirm patency of the right common femoral artery. A permanent ultrasound image  was recorded. Under direct ultrasound guidance, access of the right common femoral artery was performed with a micropuncture set. A 5 French vascular sheath was placed. A 5 French VS 1 catheter was used to selectively catheterize the inferior mesenteric artery. Selective arteriography of the IMA supply was performed. A microcatheter was then introduced via the 5 French catheter and used to selectively catheterize the left colic artery. Selective arteriography was performed. The microcatheter was then advanced into a superior branch of the left colic artery and additional selective arteriography performed. Transcatheter embolization of a superior branch of the left colic artery was then performed with deployment of a single 2 mm diameter Ruby micro coil. After coil deployment, additional arteriography was performed through the microcatheter. The microcatheter and VS1 catheter were removed. A 5 French Cobra catheter was then advanced into the abdominal aorta and used to selectively catheterize the superior mesenteric artery. The catheter was advanced into the proximal trunk of the SMA and selective arteriography performed. The Cobra catheter was removed. Arteriotomy closure was performed with the Angio-Seal device. FINDINGS: IMA arteriography demonstrates no active contrast extravasation, arteriovenous malformation or arterial pseudoaneurysms in the IMA distribution. Selective arteriography at the level of the left colic artery demonstrates  supply to the distal transverse colon and splenic flexure. Additional selective arteriography was then performed of a superior branch supplying the distal transverse colon and directed towards cross flow with SMA supply. Given positive bleeding scan at this level, decision was made to place a microcoil to reduce cross flow from the SMA and perfusion pressure to the distal transverse colon in hopes diminishing further bleeding. Post embolization arteriography demonstrates reduced flow without arterial compromise to the colon. SMA arteriography demonstrates no evidence of active bleeding, arteriovenous malformation or other vascular abnormality. IMPRESSION: Arteriography performed as above, focused to the level of the distal transverse colon and splenic flexure which is supplied by branches of the left colic artery. A single embolization coil was placed in a superior branch of the left colic artery supplying the distal transverse colon and directed towards cross flow with SMA supply to reduce perfusion pressure and try to decrease recurrent bleeding. Electronically Signed   By: Irish Lack M.D.   On: 04/05/2022 20:02   IR EMBO ART  VEN HEMORR LYMPH EXTRAV  INC GUIDE ROADMAPPING  Result Date: 04/05/2022 INDICATION: GI bleed with positive nuclear medicine bleeding scan at the level of the distal transverse colon/splenic flexure. EXAM: 1. ULTRASOUND GUIDANCE FOR VASCULAR ACCESS OF THE RIGHT COMMON FEMORAL ARTERY 2. VISCERAL ARTERIOGRAPHY OF THE INFERIOR MESENTERIC ARTERY 3. ADDITIONAL SELECTIVE ARTERIOGRAPHY OF THE LEFT COLIC ARTERY, SECOND ORDER 4. ADDITIONAL SELECTIVE ARTERIOGRAPHY OF A LEFT COLIC ARTERY BRANCH, THIRD ORDER 5. TRANSCATHETER EMBOLIZATION OF LEFT COLIC ARTERY BRANCH 6. VISCERAL ARTERIOGRAPHY OF THE SUPERIOR MESENTERIC ARTERY MEDICATIONS: NONE ANESTHESIA/SEDATION: Moderate (conscious) sedation was employed during this procedure. A total of Versed 2.0 mg and Fentanyl 100 mcg was administered  intravenously by radiology nursing. Moderate Sedation Time: 73 minutes. The patient's level of consciousness and vital signs were monitored continuously by radiology nursing throughout the procedure under my direct supervision. CONTRAST:  40 mL Visipaque 320 FLUOROSCOPY TIME:  Radiation Exposure Index (as provided by the fluoroscopic device): 977 mGy Kerma COMPLICATIONS: None immediate. PROCEDURE: Informed consent was obtained from the patient following explanation of the procedure, risks, benefits and alternatives. The patient understands, agrees and consents for the procedure. All questions were addressed. A time out was performed prior to the initiation of the procedure. Maximal barrier  sterile technique utilized including caps, mask, sterile gowns, sterile gloves, large sterile drape, hand hygiene, and chlorhexidine prep. Ultrasound was used to confirm patency of the right common femoral artery. A permanent ultrasound image was recorded. Under direct ultrasound guidance, access of the right common femoral artery was performed with a micropuncture set. A 5 French vascular sheath was placed. A 5 French VS 1 catheter was used to selectively catheterize the inferior mesenteric artery. Selective arteriography of the IMA supply was performed. A microcatheter was then introduced via the 5 French catheter and used to selectively catheterize the left colic artery. Selective arteriography was performed. The microcatheter was then advanced into a superior branch of the left colic artery and additional selective arteriography performed. Transcatheter embolization of a superior branch of the left colic artery was then performed with deployment of a single 2 mm diameter Ruby micro coil. After coil deployment, additional arteriography was performed through the microcatheter. The microcatheter and VS1 catheter were removed. A 5 French Cobra catheter was then advanced into the abdominal aorta and used to selectively catheterize  the superior mesenteric artery. The catheter was advanced into the proximal trunk of the SMA and selective arteriography performed. The Cobra catheter was removed. Arteriotomy closure was performed with the Angio-Seal device. FINDINGS: IMA arteriography demonstrates no active contrast extravasation, arteriovenous malformation or arterial pseudoaneurysms in the IMA distribution. Selective arteriography at the level of the left colic artery demonstrates supply to the distal transverse colon and splenic flexure. Additional selective arteriography was then performed of a superior branch supplying the distal transverse colon and directed towards cross flow with SMA supply. Given positive bleeding scan at this level, decision was made to place a microcoil to reduce cross flow from the SMA and perfusion pressure to the distal transverse colon in hopes diminishing further bleeding. Post embolization arteriography demonstrates reduced flow without arterial compromise to the colon. SMA arteriography demonstrates no evidence of active bleeding, arteriovenous malformation or other vascular abnormality. IMPRESSION: Arteriography performed as above, focused to the level of the distal transverse colon and splenic flexure which is supplied by branches of the left colic artery. A single embolization coil was placed in a superior branch of the left colic artery supplying the distal transverse colon and directed towards cross flow with SMA supply to reduce perfusion pressure and try to decrease recurrent bleeding. Electronically Signed   By: Irish Lack M.D.   On: 04/05/2022 20:02   IR Angiogram Visceral Selective  Result Date: 04/05/2022 INDICATION: GI bleed with positive nuclear medicine bleeding scan at the level of the distal transverse colon/splenic flexure. EXAM: 1. ULTRASOUND GUIDANCE FOR VASCULAR ACCESS OF THE RIGHT COMMON FEMORAL ARTERY 2. VISCERAL ARTERIOGRAPHY OF THE INFERIOR MESENTERIC ARTERY 3. ADDITIONAL  SELECTIVE ARTERIOGRAPHY OF THE LEFT COLIC ARTERY, SECOND ORDER 4. ADDITIONAL SELECTIVE ARTERIOGRAPHY OF A LEFT COLIC ARTERY BRANCH, THIRD ORDER 5. TRANSCATHETER EMBOLIZATION OF LEFT COLIC ARTERY BRANCH 6. VISCERAL ARTERIOGRAPHY OF THE SUPERIOR MESENTERIC ARTERY MEDICATIONS: NONE ANESTHESIA/SEDATION: Moderate (conscious) sedation was employed during this procedure. A total of Versed 2.0 mg and Fentanyl 100 mcg was administered intravenously by radiology nursing. Moderate Sedation Time: 73 minutes. The patient's level of consciousness and vital signs were monitored continuously by radiology nursing throughout the procedure under my direct supervision. CONTRAST:  40 mL Visipaque 320 FLUOROSCOPY TIME:  Radiation Exposure Index (as provided by the fluoroscopic device): 977 mGy Kerma COMPLICATIONS: None immediate. PROCEDURE: Informed consent was obtained from the patient following explanation of the procedure, risks, benefits and alternatives. The  patient understands, agrees and consents for the procedure. All questions were addressed. A time out was performed prior to the initiation of the procedure. Maximal barrier sterile technique utilized including caps, mask, sterile gowns, sterile gloves, large sterile drape, hand hygiene, and chlorhexidine prep. Ultrasound was used to confirm patency of the right common femoral artery. A permanent ultrasound image was recorded. Under direct ultrasound guidance, access of the right common femoral artery was performed with a micropuncture set. A 5 French vascular sheath was placed. A 5 French VS 1 catheter was used to selectively catheterize the inferior mesenteric artery. Selective arteriography of the IMA supply was performed. A microcatheter was then introduced via the 5 French catheter and used to selectively catheterize the left colic artery. Selective arteriography was performed. The microcatheter was then advanced into a superior branch of the left colic artery and additional  selective arteriography performed. Transcatheter embolization of a superior branch of the left colic artery was then performed with deployment of a single 2 mm diameter Ruby micro coil. After coil deployment, additional arteriography was performed through the microcatheter. The microcatheter and VS1 catheter were removed. A 5 French Cobra catheter was then advanced into the abdominal aorta and used to selectively catheterize the superior mesenteric artery. The catheter was advanced into the proximal trunk of the SMA and selective arteriography performed. The Cobra catheter was removed. Arteriotomy closure was performed with the Angio-Seal device. FINDINGS: IMA arteriography demonstrates no active contrast extravasation, arteriovenous malformation or arterial pseudoaneurysms in the IMA distribution. Selective arteriography at the level of the left colic artery demonstrates supply to the distal transverse colon and splenic flexure. Additional selective arteriography was then performed of a superior branch supplying the distal transverse colon and directed towards cross flow with SMA supply. Given positive bleeding scan at this level, decision was made to place a microcoil to reduce cross flow from the SMA and perfusion pressure to the distal transverse colon in hopes diminishing further bleeding. Post embolization arteriography demonstrates reduced flow without arterial compromise to the colon. SMA arteriography demonstrates no evidence of active bleeding, arteriovenous malformation or other vascular abnormality. IMPRESSION: Arteriography performed as above, focused to the level of the distal transverse colon and splenic flexure which is supplied by branches of the left colic artery. A single embolization coil was placed in a superior branch of the left colic artery supplying the distal transverse colon and directed towards cross flow with SMA supply to reduce perfusion pressure and try to decrease recurrent bleeding.  Electronically Signed   By: Irish Lack M.D.   On: 04/05/2022 20:02     Assessment/Plan:  66 y.o. female with likely diverticular bleed, currently localized to the splenic flexure/distal transverse colon, complicated by pertinent comorbidities including:  Embolization attempt today, without angiographic evidence of active bleeding at the time.   Patient Active Problem List   Diagnosis Date Noted   Diverticulosis of colon with hemorrhage    GI bleed 04/04/2022   Hematochezia    Acute blood loss anemia    Sigmoid diverticulosis    External hemorrhoids    Lower GI bleed 01/29/2022   History of GI diverticular bleed 01/23/2022   BMI 38.0-38.9,adult 01/14/2022   Gallstones 10/24/2020   Dysphagia 07/23/2020   Acute non-recurrent sinusitis 05/31/2020   Osteopenia 05/09/2020   Onychomycosis 11/03/2019   Asthma 08/29/2019   Hiatal hernia 06/06/2019   Anxiety 11/25/2018   Palpitations 10/19/2018   Vitamin D deficiency 10/06/2018   Prediabetes 10/06/2018   Essential hypertension 08/18/2018  Gastroesophageal reflux disease 08/18/2018   Hyperlipidemia 08/18/2018   Mild intermittent asthma 08/18/2018   Allergic rhinitis due to pollen 05/11/2014    -I discussed the likelihood of requiring surgical intervention to definitively stop this bleeding.  However she is well aware she had previously bled from a more distal source just months ago.  Definitive resection and avoidance of subtotal colectomy with end ileostomy renders risk of potential rebleeding.  She is unwilling to consider surgery at this time, seeking a less morbid option.  -Discussed with Dr. Allegra Lai, considering potential reattempt at angiographic embolization in the morning should she remain hemodynamically stable through the remaining night hours.  -Appreciate critical care service management for assisting with transfusions and vasopressors as needed.  I remain readily available to assist with operative intervention should her  bleeding persist.   Thank you for the opportunity to participate in this patient's care.   -- Campbell Lerner, M.D., FACS 04/06/2022, 1:19 AM

## 2022-04-06 NOTE — Assessment & Plan Note (Addendum)
Patient had a microbeed colonic embolization by Dr. Lucky Cowboy today (distal transverse colonic branch of SMA and splenic flexure branch and proximal left colonic branch).  During the hospital course the patient has received 3 units of packed red blood cells.  Latest hemoglobin is 9.2.

## 2022-04-06 NOTE — Progress Notes (Signed)
Brief Note Seen same day of initial consultation More alert this morning; no pain Hgb improved to 10.8 after 2 units pRBCs She is off vasopressor support as well   At the time of our interview, she was being prepared to go to the vascular suite for repeat embolization with vascular surgery. We will continue to monitor her for re-bleeding. Should this recur after multiple attempts at embolization, we will need to most likely proceed with colectomy. There is of course also carries a higher risk of ischemia given multiple embolizations which would also likely require colectomy as well. We will follow closely. Patient and her husband seem understanding. All questions answered.   We will be readily available.   -- Edison Simon, PA-C [facility] 04/06/2022, 9:36 AM M-F: 7am - 4pm

## 2022-04-06 NOTE — Progress Notes (Signed)
Progress Note   Patient: Cheryl Coleman UKG:[id number] DOB: Nov 16, 1955 DOA: 04/03/2022     2 DOS: the patient was seen and examined on 04/06/2022   Brief hospital course: 66 year old female past medical history of arthritis, asthma, GERD, hyperlipidemia, tachycardia and hypertension.  She recently had a diverticular bleed requiring embolization to the descending colon and sigmoid colon.  He came back with GI bleed.  Initial CT angio was negative for bleeding.  Bleeding scan on 04/04/2022 was positive after 75 minutes in the distal transverse colon.  Patient was given 1 unit of packed red blood cells on 04/04/2022.  With continued bleeding the patient did have a coil embolization by Dr. Kathlene Cote on 04/05/2022.  The patient continued to bleed and became hypotensive requiring 2 units of packed red blood cells on the evening of 04/05/2022.  General surgery was consulted.  Dr. Lucky Cowboy vascular surgery took for microbleed embolization of the colonic branch of the SMA and splenic flexure branch on 04/06/2022.  Assessment and Plan: * Colonic hemorrhage Patient had a microbeed colonic embolization by Dr. Lucky Cowboy today (distal transverse colonic branch of SMA and splenic flexure branch and proximal left colonic branch).  During the hospital course the patient has received 3 units of packed red blood cells.  Latest hemoglobin is 9.2.  Acute blood loss anemia Hemoglobin 12.3 upon admission.  Patient received 1 unit of packed red blood cells on 04/04/2022.  2 units of packed red blood cells last night.  Most recent hemoglobin 9.2  Hypovolemic shock (HCC) Patient required 2 units of packed red blood cells last night and was placed briefly on Levophed  Hypomagnesemia Replace IV magnesium today  Asthma Asymptomatic. Continue home regimen.   Gastroesophageal reflux disease Back on IV PPI  Essential hypertension Holding Cardizem with GI bleed        Subjective: Patient had 2 more episodes of bloody bowel movement last  night.  Had trouble getting back into the bed after these bowel movements.  Required 2 units of packed red blood cells last night.  Patient seen this morning before embolization procedure.  Physical Exam: Vitals:   04/06/22 0945 04/06/22 1000 04/06/22 1015 04/06/22 1100  BP: (!) 145/72 131/78 134/78   Pulse: 99 97 97 100  Resp: 20 18 (!) 21 18  Temp:      TempSrc:      SpO2: 100% 98% 99% 99%  Weight:      Height:       Physical Exam HENT:     Head: Normocephalic.     Mouth/Throat:     Pharynx: No oropharyngeal exudate.  Eyes:     General: Lids are normal.     Conjunctiva/sclera: Conjunctivae normal.  Cardiovascular:     Rate and Rhythm: Normal rate and regular rhythm.     Heart sounds: Normal heart sounds, S1 normal and S2 normal.  Pulmonary:     Breath sounds: No decreased breath sounds, wheezing, rhonchi or rales.  Abdominal:     Palpations: Abdomen is soft.     Tenderness: There is no abdominal tenderness.  Musculoskeletal:     Right lower leg: No swelling.     Left lower leg: No swelling.  Skin:    General: Skin is warm.     Findings: No rash.  Neurological:     Mental Status: She is alert and oriented to person, place, and time.     Data Reviewed: Last hemoglobin 9.2, platelet count 147, white blood cell count 13.7, magnesium 1.6,  creatinine 0.77  Family Communication: Spoke with husband at the bedside this morning  Disposition: Status is: Inpatient Remains inpatient appropriate because: Microbleed embolization today.  Need to watch for further signs of bleeding.  Planned Discharge Destination: Home    Time spent: 28 minutes Case discussed with general surgery.  Author: Alford Highland, MD 04/06/2022 12:27 PM  For on call review [email or web address].

## 2022-04-07 DIAGNOSIS — K921 Melena: Secondary | ICD-10-CM

## 2022-04-07 DIAGNOSIS — J4520 Mild intermittent asthma, uncomplicated: Secondary | ICD-10-CM | POA: Diagnosis not present

## 2022-04-07 DIAGNOSIS — K922 Gastrointestinal hemorrhage, unspecified: Secondary | ICD-10-CM | POA: Diagnosis not present

## 2022-04-07 DIAGNOSIS — R571 Hypovolemic shock: Secondary | ICD-10-CM | POA: Diagnosis not present

## 2022-04-07 DIAGNOSIS — D62 Acute posthemorrhagic anemia: Secondary | ICD-10-CM | POA: Diagnosis not present

## 2022-04-07 LAB — TYPE AND SCREEN
ABO/RH(D): O POS
Antibody Screen: NEGATIVE
Unit division: 0
Unit division: 0
Unit division: 0

## 2022-04-07 LAB — BASIC METABOLIC PANEL WITH GFR
Anion gap: 7 (ref 5–15)
BUN: <5 mg/dL — ABNORMAL LOW (ref 8–23)
CO2: 26 mmol/L (ref 22–32)
Calcium: 8.2 mg/dL — ABNORMAL LOW (ref 8.9–10.3)
Chloride: 107 mmol/L (ref 98–111)
Creatinine, Ser: 0.74 mg/dL (ref 0.44–1.00)
GFR, Estimated: >60 mL/min
Glucose, Bld: 114 mg/dL — ABNORMAL HIGH (ref 70–99)
Potassium: 3.5 mmol/L (ref 3.5–5.1)
Sodium: 140 mmol/L (ref 135–145)

## 2022-04-07 LAB — CBC
HCT: 24.9 % — ABNORMAL LOW (ref 36.0–46.0)
Hemoglobin: 8.4 g/dL — ABNORMAL LOW (ref 12.0–15.0)
MCH: 27.3 pg (ref 26.0–34.0)
MCHC: 33.7 g/dL (ref 30.0–36.0)
MCV: 80.8 fL (ref 80.0–100.0)
Platelets: 159 K/uL (ref 150–400)
RBC: 3.08 MIL/uL — ABNORMAL LOW (ref 3.87–5.11)
RDW: 16 % — ABNORMAL HIGH (ref 11.5–15.5)
WBC: 14.8 K/uL — ABNORMAL HIGH (ref 4.0–10.5)
nRBC: 0.2 % (ref 0.0–0.2)

## 2022-04-07 LAB — BPAM RBC
Blood Product Expiration Date: 202310172359
Blood Product Expiration Date: 202310212359
Blood Product Expiration Date: 202310242359
ISSUE DATE / TIME: 202309162050
ISSUE DATE / TIME: 202309180007
ISSUE DATE / TIME: 202309180223
Unit Type and Rh: 5100
Unit Type and Rh: 5100
Unit Type and Rh: 5100

## 2022-04-07 MED ORDER — SODIUM CHLORIDE 0.9 % IV SOLN
300.0000 mg | Freq: Once | INTRAVENOUS | Status: AC
  Administered 2022-04-07: 300 mg via INTRAVENOUS
  Filled 2022-04-07: qty 15

## 2022-04-07 NOTE — Progress Notes (Signed)
[facility]  Daily Progress Note   Subjective  -   Patient resting comfortably postembolization.  Noted a small dark stool bowel movement postprocedure but no further bloody movements today.  Objective Vitals:   04/07/22 0700 04/07/22 0759 04/07/22 0800 04/07/22 1215  BP:  (!) 131/57 120/67   Pulse: 92 93 93 (!) 101  Resp: 20 16 17    Temp:  99 F (37.2 C)  98.9 F (37.2 C)  TempSrc:  Oral  Oral  SpO2: 96% 97% 97% 97%  Weight:      Height:        Intake/Output Summary (Last 24 hours) at 04/07/2022 1609 Last data filed at 04/07/2022 1200 Gross per 24 hour  Intake 2249.41 ml  Output 1000 ml  Net 1249.41 ml    PULM  CTAB CV  RRR VASC  no new bowel movement, no abdominal pain  Laboratory CBC    Component Value Date/Time   WBC 14.8 (H) 04/07/2022 0610   HGB 8.4 (L) 04/07/2022 0610   HCT 24.9 (L) 04/07/2022 0610   PLT 159 04/07/2022 0610    BMET    Component Value Date/Time   NA 140 04/07/2022 0610   K 3.5 04/07/2022 0610   CL 107 04/07/2022 0610   CO2 26 04/07/2022 0610   GLUCOSE 114 (H) 04/07/2022 0610   BUN <5 (L) 04/07/2022 0610   CREATININE 0.74 04/07/2022 0610   CALCIUM 8.2 (L) 04/07/2022 0610   GFRNONAA >60 04/07/2022 0610    Assessment/Planning: POD #1 s/p MICRA V embolization of distal transverse colonic branch of the SMA as well as a splenic flexure branch of the proximal left colonic  Patient had a small bowel movement postprocedure yesterday, no further bloody bowel movements today No significant abdominal pain currently Hemoglobin is stable No role for further intervention currently.  Should the patient develop further bleeding colectomy will be required.  However, at this time it appears that bleeding has subsided.  At this time vascular surgery will sign off  Kris Hartmann  04/07/2022, 4:09 PM

## 2022-04-07 NOTE — Progress Notes (Signed)
Progress Note   Patient: Cheryl Coleman [id number]:[id number] DOB: [birth year] DOA: 04/03/2022     3 DOS: the patient was seen and examined on 04/07/2022   Brief hospital course: 66 year old female past medical history of arthritis, asthma, GERD, hyperlipidemia, tachycardia and hypertension.  She recently had a diverticular bleed requiring embolization to the descending colon and sigmoid colon.  He came back with GI bleed.  Initial CT angio was negative for bleeding.  Bleeding scan on 04/04/2022 was positive after 75 minutes in the distal transverse colon.  Patient was given 1 unit of packed red blood cells on 04/04/2022.  With continued bleeding the patient did have a coil embolization by Dr. Kathlene Cote on 04/05/2022.  The patient continued to bleed and became hypotensive requiring 2 units of packed red blood cells on the evening of 04/05/2022.  General surgery was consulted.  Dr. Lucky Cowboy vascular surgery took for microbleed embolization of the colonic branch of the SMA and splenic flexure branch on 04/06/2022.  Had one bleeding episode after procedure yesterday but no further bleeding.  Assessment and Plan: * Colonic hemorrhage Dr. Kathlene Cote did a coil embolization on 04/05/2022.  The patient was still bleeding and the patient had a microbeed colonic embolization by Dr. Lucky Cowboy on 04/06/2022 (distal transverse colonic branch of SMA and splenic flexure branch and proximal left colonic branch).  During the hospital course the patient has received 3 units of packed red blood cells.  Latest hemoglobin is 8.4.  IV iron given on 04/07/2022  Acute blood loss anemia Hemoglobin 12.3 upon admission.  Patient received 1 unit of packed red blood cells on 04/04/2022.  2 units of packed red blood cells overnight on 04/05/2022.  Most recent hemoglobin 8.4.  IV iron given on 04/07/2022  Hypovolemic shock ([place]) Resolved after receiving 2 units of packed red blood cells the other night.  Hypomagnesemia Replaced  Asthma Asymptomatic. Continue  home regimen.   Gastroesophageal reflux disease Back on IV PPI  Essential hypertension Holding Cardizem with GI bleed        Subjective: Patient with no further bleeding.  Had repeat angiogram yesterday with microbleed embolization by Dr. Lucky Cowboy.  Physical Exam: Vitals:   04/07/22 1300 04/07/22 1400 04/07/22 1500 04/07/22 1600  BP: 128/61 (!) 128/53 (!) 84/60 (!) 96/53  Pulse:      Resp: (!) 21 18 19 20   Temp:    98.7 F (37.1 C)  TempSrc:    Oral  SpO2:      Weight:      Height:       Physical Exam HENT:     Head: Normocephalic.     Mouth/Throat:     Pharynx: No oropharyngeal exudate.  Eyes:     General: Lids are normal.     Conjunctiva/sclera: Conjunctivae normal.  Cardiovascular:     Rate and Rhythm: Normal rate and regular rhythm.     Heart sounds: Normal heart sounds, S1 normal and S2 normal.  Pulmonary:     Breath sounds: No decreased breath sounds, wheezing, rhonchi or rales.  Abdominal:     Palpations: Abdomen is soft.     Tenderness: There is no abdominal tenderness.  Musculoskeletal:     Right lower leg: No swelling.     Left lower leg: No swelling.  Skin:    General: Skin is warm.     Findings: No rash.  Neurological:     Mental Status: She is alert and oriented to person, place, and time.     Data  Reviewed: Labs reviewed Hemoglobin 8.4, platelet count 159, white blood cell count 14.8, potassium 3.5  Family Communication: Spoke with husband at the bedside  Disposition: Status is: Inpatient Remains inpatient appropriate because: Watch for further bowel movements overnight.  Planned Discharge Destination: Home    Time spent: 28 minutes  Author: Alford Highland, MD 04/07/2022 6:00 PM  For on call review [email or web address].

## 2022-04-07 NOTE — Progress Notes (Signed)
[facility] SURGICAL PROGRESS NOTE (cpt [phone])  Hospital Day(s): 3.   Post op day(s): 1 Day Post-Op.   Interval History: Patient seen and examined, no acute events or new complaints overnight. Patient reports she is feeling better this morning. She did have some crampy abdominal discomfort but this improved after passing flatus. No fever, chills, nausea, emesis. She had one BM last night with a small amount of old blood in it otherwise BMs have been non-bloody. Hgb to 8.4 this morning (from 8.3). She does have a mild bump in leukocytosis to 14.8K. Renal function is normal; scr - 0.74; UO - 500 ccs + unmeasured. No electrolyte derangements. She is on CLD; tolerated; wanting more.   Review of Systems:  Constitutional: denies fever, chills  HEENT: denies cough or congestion  Respiratory: denies any shortness of breath  Cardiovascular: denies chest pain or palpitations  Gastrointestinal: denies abdominal pain, N/V, no more bloody bowel movements.  Genitourinary: denies burning with urination or urinary frequency Musculoskeletal: denies pain, decreased motor or sensation  Vital signs in last 24 hours: [min-max] current  Temp:  [98.1 F (36.7 C)-98.8 F (37.1 C)] 98.4 F (36.9 C) (09/19 0450) Pulse Rate:  [0-118] 111 (09/19 0600) Resp:  [14-32] 20 (09/19 0600) BP: (111-158)/(62-111) 126/74 (09/19 0450) SpO2:  [93 %-100 %] 96 % (09/19 0600) Weight:  [99.6 kg] 99.6 kg (09/18 0832)     Height: 5\' 4"  (162.6 cm) Weight: 99.6 kg BMI (Calculated): 37.67   Intake/Output last 2 shifts:  09/18 0701 - 09/19 0700 In: 1654.8 [I.V.:1654.8] Out: -    Physical Exam:  Constitutional: alert, cooperative and no distress  HENT: normocephalic without obvious abnormality  Eyes: PERRL, EOM's grossly intact and symmetric  Respiratory: breathing non-labored at rest  Cardiovascular: borderline tachycardia to 102 bpm and sinus rhythm  Gastrointestinal: soft, non-tender, and non-distended; no  rebound/guarding Musculoskeletal: no edema or wounds, motor and sensation grossly intact, NT    Labs:     Latest Ref Rng & Units 04/07/2022    6:10 AM 04/06/2022   10:17 PM 04/06/2022    4:22 PM  CBC  WBC 4.0 - 10.5 K/uL 14.8     Hemoglobin 12.0 - 15.0 g/dL 8.4  8.3  8.9   Hematocrit 36.0 - 46.0 % 24.9  24.3  26.6   Platelets 150 - 400 K/uL 159         Latest Ref Rng & Units 04/07/2022    6:10 AM 04/06/2022    6:58 AM 04/05/2022    4:37 AM  CMP  Glucose 70 - 99 mg/dL 04/07/2022  710  626   BUN 8 - 23 mg/dL 5  7  11    Creatinine 0.44 - 1.00 mg/dL 948  5.46  2.70   Sodium 135 - 145 mmol/L 140  141  141   Potassium 3.5 - 5.1 mmol/L 3.5  3.7  3.6   Chloride 98 - 111 mmol/L 107  113  110   CO2 22 - 32 mmol/L 26  23  25    Calcium 8.9 - 10.3 mg/dL 8.2  8.1  8.7     Imaging studies: No new pertinent imaging studies   Assessment/Plan: (ICD-10's: K92.1) 66 y.o. hemodynamically stable female with now further evidence of GI bleeding s/p embolization on 09/18   - Fortunately, she has not had any additional episodes of bloody bowel movements. She is hemodynamically stable without abdominal pain as well. For now, we will continue to follow closely without any need  for surgical intervention at the present time. Should she deteriorate from an abdominal perspective or have return of bloody bowel movements, we will likely need to proceed with colectomy. Patient, and her husband, are understanding.   - Agree with starting to advance diet    - Monitor abdominal examination - Monitor H&H - Monitor leukocytosis - Pain control prn; antiemetics prn   - Further management per primary service; we will follow    All of the above findings and recommendations were discussed with the patient, patient's family (husband at bedside), and the medical team, and all of their questions were answered to their expressed satisfaction.  -- Edison Simon, PA-C [facility] 04/07/2022, 7:41 AM M-F: 7am  - 4pm

## 2022-04-08 DIAGNOSIS — K921 Melena: Secondary | ICD-10-CM | POA: Diagnosis not present

## 2022-04-08 DIAGNOSIS — K922 Gastrointestinal hemorrhage, unspecified: Secondary | ICD-10-CM | POA: Diagnosis not present

## 2022-04-08 LAB — BASIC METABOLIC PANEL WITH GFR
Anion gap: 5 (ref 5–15)
BUN: 7 mg/dL — ABNORMAL LOW (ref 8–23)
CO2: 26 mmol/L (ref 22–32)
Calcium: 8.4 mg/dL — ABNORMAL LOW (ref 8.9–10.3)
Chloride: 110 mmol/L (ref 98–111)
Creatinine, Ser: 0.78 mg/dL (ref 0.44–1.00)
GFR, Estimated: >60 mL/min
Glucose, Bld: 95 mg/dL (ref 70–99)
Potassium: 3.4 mmol/L — ABNORMAL LOW (ref 3.5–5.1)
Sodium: 141 mmol/L (ref 135–145)

## 2022-04-08 LAB — HEMOGLOBIN AND HEMATOCRIT, BLOOD
HCT: 24.9 % — ABNORMAL LOW (ref 36.0–46.0)
Hemoglobin: 8.4 g/dL — ABNORMAL LOW (ref 12.0–15.0)

## 2022-04-08 LAB — CBC
HCT: 23.8 % — ABNORMAL LOW (ref 36.0–46.0)
Hemoglobin: 8.1 g/dL — ABNORMAL LOW (ref 12.0–15.0)
MCH: 27.8 pg (ref 26.0–34.0)
MCHC: 34 g/dL (ref 30.0–36.0)
MCV: 81.8 fL (ref 80.0–100.0)
Platelets: 172 K/uL (ref 150–400)
RBC: 2.91 MIL/uL — ABNORMAL LOW (ref 3.87–5.11)
RDW: 16.8 % — ABNORMAL HIGH (ref 11.5–15.5)
WBC: 13.6 K/uL — ABNORMAL HIGH (ref 4.0–10.5)
nRBC: 0.3 % — ABNORMAL HIGH (ref 0.0–0.2)

## 2022-04-08 MED ORDER — PANTOPRAZOLE SODIUM 40 MG PO TBEC
40.0000 mg | DELAYED_RELEASE_TABLET | Freq: Two times a day (BID) | ORAL | Status: DC
  Administered 2022-04-08 – 2022-04-09 (×3): 40 mg via ORAL
  Filled 2022-04-08 (×3): qty 1

## 2022-04-08 NOTE — Progress Notes (Signed)
[facility] SURGICAL PROGRESS NOTE (cpt [id number])  Hospital Day(s): 4.   Post op day(s): 2 Days Post-Op.   Interval History: Patient seen and examined, no acute events or new complaints overnight. Patient reports she is doing well; feels a little "woozy" today. She denied any abdominal pain, some cramping before passing flatus. No fever, chills, nausea, emesis. Hgb to 8.1 this morning; very stable trend. Leukocytosis is improved; 13.6K. Renal function is normal; scr - 0.78; UO - 1500 ccs + unmeasured. Mild hypokalemia to 3.4. She is on heart healthy diet; tolerating. She does not feel that she has had any BM in last 12-24 hours but is passing flatus. No gross blood per rectum.   Review of Systems:  Constitutional: denies fever, chills  HEENT: denies cough or congestion  Respiratory: denies any shortness of breath  Cardiovascular: denies chest pain or palpitations  Gastrointestinal: denies abdominal pain, N/V, no more bloody bowel movements.  Genitourinary: denies burning with urination or urinary frequency Musculoskeletal: denies pain, decreased motor or sensation  Vital signs in last 24 hours: [min-max] current  Temp:  [98.2 F (36.8 C)-99 F (37.2 C)] 98.2 F (36.8 C) (09/19 2100) Pulse Rate:  [93-106] 94 (09/20 0600) Resp:  [14-28] 19 (09/20 0600) BP: (84-148)/(41-77) 111/60 (09/20 0600) SpO2:  [95 %-98 %] 96 % (09/20 0600)     Height: 5\' 4"  (162.6 cm) Weight: 99.6 kg BMI (Calculated): 37.67   Intake/Output last 2 shifts:  09/19 0701 - 09/20 0700 In: 1434.6 [P.O.:1080; I.V.:89.6; IV Piggyback:265] Out: 1500 [Urine:1500]   Physical Exam:  Constitutional: alert, cooperative and no distress  HENT: normocephalic without obvious abnormality  Eyes: PERRL, EOM's grossly intact and symmetric  Respiratory: breathing non-labored at rest  Cardiovascular: borderline tachycardia to 102 bpm and sinus rhythm  Gastrointestinal: soft, non-tender, and non-distended; no  rebound/guarding Musculoskeletal: no edema or wounds, motor and sensation grossly intact, NT    Labs:     Latest Ref Rng & Units 04/08/2022    4:49 AM 04/07/2022    6:10 AM 04/06/2022   10:17 PM  CBC  WBC 4.0 - 10.5 K/uL 13.6  14.8    Hemoglobin 12.0 - 15.0 g/dL 8.1  8.4  8.3   Hematocrit 36.0 - 46.0 % 23.8  24.9  24.3   Platelets 150 - 400 K/uL 172  159        Latest Ref Rng & Units 04/08/2022    4:49 AM 04/07/2022    6:10 AM 04/06/2022    6:58 AM  CMP  Glucose 70 - 99 mg/dL 95  114  116   BUN 8 - 23 mg/dL 7  <5  7   Creatinine 0.44 - 1.00 mg/dL 0.78  0.74  0.77   Sodium 135 - 145 mmol/L 141  140  141   Potassium 3.5 - 5.1 mmol/L 3.4  3.5  3.7   Chloride 98 - 111 mmol/L 110  107  113   CO2 22 - 32 mmol/L 26  26  23    Calcium 8.9 - 10.3 mg/dL 8.4  8.2  8.1     Imaging studies: No new pertinent imaging studies   Assessment/Plan: (ICD-10's: K92.1) 66 y.o. hemodynamically stable female without any further evidence of GI bleeding s/p embolization on 09/18   - Fortunately, she has not had any additional episodes of bloody bowel movements. She is hemodynamically stable without abdominal pain as well. Hgb remains stable as well. For now, we will continue to follow closely without any  need for surgical intervention at the present time. Should she deteriorate from an abdominal perspective or have return of bloody bowel movements, we will likely need to proceed with colectomy. Patient, and her husband, are understanding.   - Continue diet as tolerated   - Monitor abdominal examination - Monitor H&H; stable - Monitor leukocytosis; improving - Pain control prn; antiemetics prn   - Further management per primary service; we will follow    All of the above findings and recommendations were discussed with the patient, patient's family (husband at bedside), and the medical team, and all of their questions were answered to their expressed satisfaction.  -- Edison Simon, PA-C [facility] 04/08/2022, 7:43 AM M-F: 7am - 4pm

## 2022-04-08 NOTE — Progress Notes (Signed)
PROGRESS NOTE    Cheryl Coleman  JOI:[id number] DOB: 01/18/1956 DOA: 04/03/2022 PCP: [name], [name], MD    Brief Narrative:  66 year old female past medical history of arthritis, asthma, GERD, hyperlipidemia, tachycardia and hypertension.  She recently had a diverticular bleed requiring embolization to the descending colon and sigmoid colon.  He came back with GI bleed.  Initial CT angio was negative for bleeding.  Bleeding scan on 04/04/2022 was positive after 75 minutes in the distal transverse colon.  Patient was given 1 unit of packed red blood cells on 04/04/2022.  With continued bleeding the patient did have a coil embolization by Dr. Fredia Sorrow on 04/05/2022.  The patient continued to bleed and became hypotensive requiring 2 units of packed red blood cells on the evening of 04/05/2022.  General surgery was consulted.  Dr. Wyn Quaker vascular surgery took for microbleed embolization of the colonic branch of the SMA and splenic flexure branch on 04/06/2022.  Had one bleeding episode after procedure yesterday but no further bleeding.  Passing some clots on 9/20.  Hemoglobin remained stable at 8.4.  Surgery following.  No immediate plans for colectomy but patient understands that should she develop hemodynamically significant bleeding then a colectomy would be the next.   Assessment & Plan:   Principal Problem:   Colonic hemorrhage Active Problems:   Acute blood loss anemia   Hypovolemic shock (HCC)   Essential hypertension   Gastroesophageal reflux disease   Asthma   Diverticulosis of colon with hemorrhage   Hypomagnesemia   Hematochezia   * Colonic hemorrhage Dr. Fredia Sorrow did a coil embolization on 04/05/2022.  The patient was still bleeding and the patient had a microbeed colonic embolization by Dr. Wyn Quaker on 04/06/2022 (distal transverse colonic branch of SMA and splenic flexure branch and proximal left colonic branch).  During the hospital course the patient has received 3 units of packed red  blood cells.  Latest hemoglobin is 8.4.  IV iron given on 04/07/2022.  Vascular surgery signed off 9/19.  General surgery continues to follow.  Monitor for now.  Should the patient develop any hemodynamically significant bleeding she will likely need a colectomy.   Acute blood loss anemia Hemoglobin 12.3 upon admission.  Patient received 1 unit of packed red blood cells on 04/04/2022.  2 units of packed red blood cells overnight on 04/05/2022.  Most recent hemoglobin 8.4.  IV iron given on 04/07/2022.  No further need for transfusion at this time   Hypovolemic shock (HCC) Resolved   Hypomagnesemia Replaced   Asthma Asymptomatic. Continue home regimen.    Gastroesophageal reflux disease P.o. PPI twice daily   Essential hypertension Holding Cardizem with GI bleed   DVT prophylaxis: SCD Code Status: Full Family Communication: Husband at bedside 9/20 Disposition Plan: Status is: Inpatient Remains inpatient appropriate because: GI bleed.  Monitoring for stabilization of hemoglobin   Level of care: Med-Surg  Consultants:  General surgery  Procedures:  Coil embolization  Antimicrobials: None   Subjective: Seen and examined.  Resting comfortably in bed.  No visible distress.  No complaints of pain.  Belly soft.  No abdominal tenderness.  Tolerating p.o. intake.  Objective: Vitals:   04/08/22 0600 04/08/22 0817 04/08/22 1313 04/08/22 1600  BP: 111/60   134/71  Pulse: 94 94 90 87  Resp: 19  20 20   Temp:  98.6 F (37 C) 98 F (36.7 C) 98.5 F (36.9 C)  TempSrc:  Oral Oral Oral  SpO2: 96% 96%    Weight:  Height:        Intake/Output Summary (Last 24 hours) at 04/08/2022 1629 Last data filed at 04/07/2022 1800 Gross per 24 hour  Intake 240 ml  Output 500 ml  Net -260 ml   Filed Weights   04/04/22 2010 04/06/22 0800 04/06/22 0832  Weight: 99.6 kg 99.6 kg 99.6 kg    Examination:  General exam: No acute distress Respiratory system: Clear to auscultation.  Respiratory effort normal. Cardiovascular system: S1-S2, RRR, no murmurs, no pedal edema Gastrointestinal system: Soft, NT/ND, normal bowel sounds Central nervous system: Alert and oriented. No focal neurological deficits. Extremities: Symmetric 5 x 5 power. Skin: No rashes, lesions or ulcers Psychiatry: Judgement and insight appear normal. Mood & affect appropriate.     Data Reviewed: I have personally reviewed following labs and imaging studies  CBC: Recent Labs  Lab 04/03/22 0910 04/03/22 1400 04/06/22 0658 04/06/22 1056 04/06/22 1622 04/06/22 2217 04/07/22 0610 04/08/22 0449 04/08/22 1508  WBC 9.1  --  13.7*  --   --   --  14.8* 13.6*  --   HGB 12.3   < > 8.8*   < > 8.9* 8.3* 8.4* 8.1* 8.4*  HCT 35.8*   < > 25.5*   < > 26.6* 24.3* 24.9* 23.8* 24.9*  MCV 76.8*  --  80.2  --   --   --  80.8 81.8  --   PLT 258  --  147*  --   --   --  159 172  --    < > = values in this interval not displayed.   Basic Metabolic Panel: Recent Labs  Lab 04/04/22 1211 04/05/22 0437 04/06/22 0658 04/07/22 0610 04/08/22 0449  NA 141 141 141 140 141  K 3.8 3.6 3.7 3.5 3.4*  CL 109 110 113* 107 110  CO2 22 25 23 26 26   GLUCOSE 93 104* 116* 114* 95  BUN 10 11 7* <5* 7*  CREATININE 0.82 0.88 0.77 0.74 0.78  CALCIUM 8.7* 8.7* 8.1* 8.2* 8.4*  MG  --  1.9 1.6*  --   --   PHOS  --   --  3.6  --   --    GFR: Estimated Creatinine Clearance: 78.3 mL/min (by C-G formula based on SCr of 0.78 mg/dL). Liver Function Tests: Recent Labs  Lab 04/03/22 0910  AST 19  ALT 18  ALKPHOS 62  BILITOT 0.6  PROT 8.0  ALBUMIN 3.9   No results for input(s): "LIPASE", "AMYLASE" in the last 168 hours. No results for input(s): "AMMONIA" in the last 168 hours. Coagulation Profile: No results for input(s): "INR", "PROTIME" in the last 168 hours. Cardiac Enzymes: No results for input(s): "CKTOTAL", "CKMB", "CKMBINDEX", "TROPONINI" in the last 168 hours. BNP (last 3 results) No results for input(s):  "PROBNP" in the last 8760 hours. HbA1C: No results for input(s): "HGBA1C" in the last 72 hours. CBG: Recent Labs  Lab 04/04/22 2015  GLUCAP 105*   Lipid Profile: No results for input(s): "CHOL", "HDL", "LDLCALC", "TRIG", "CHOLHDL", "LDLDIRECT" in the last 72 hours. Thyroid Function Tests: No results for input(s): "TSH", "T4TOTAL", "FREET4", "T3FREE", "THYROIDAB" in the last 72 hours. Anemia Panel: No results for input(s): "VITAMINB12", "FOLATE", "FERRITIN", "TIBC", "IRON", "RETICCTPCT" in the last 72 hours. Sepsis Labs: No results for input(s): "PROCALCITON", "LATICACIDVEN" in the last 168 hours.  Recent Results (from the past 240 hour(s))  MRSA Next Gen by PCR, Nasal     Status: None   Collection Time: 04/04/22  8:21  PM   Specimen: Nasal Mucosa; Nasal Swab  Result Value Ref Range Status   MRSA by PCR Next Gen NOT DETECTED NOT DETECTED Final    Comment: (NOTE) The GeneXpert MRSA Assay (FDA approved for NASAL specimens only), is one component of a comprehensive MRSA colonization surveillance program. It is not intended to diagnose MRSA infection nor to guide or monitor treatment for MRSA infections. Test performance is not FDA approved in patients less than 71 years old. Performed at [facility], [address] [place], [place] [address]          Radiology Studies: No results found.      Scheduled Meds:  atorvastatin  10 mg Oral q1800   Chlorhexidine Gluconate Cloth  6 each Topical Q0600   citalopram  10 mg Oral Daily   influenza vaccine adjuvanted  0.5 mL Intramuscular Tomorrow-1000   montelukast  10 mg Oral QHS   pantoprazole  40 mg Oral BID   sodium chloride flush  3 mL Intravenous Q12H   terbinafine  250 mg Oral Daily   Continuous Infusions:  sodium chloride       LOS: 4 days      Tresa Moore, MD [facility]   If 7PM-7AM, please contact night-coverage  04/08/2022, 4:29 PM

## 2022-04-09 DIAGNOSIS — K921 Melena: Secondary | ICD-10-CM | POA: Diagnosis not present

## 2022-04-09 DIAGNOSIS — K922 Gastrointestinal hemorrhage, unspecified: Secondary | ICD-10-CM | POA: Diagnosis not present

## 2022-04-09 LAB — CBC WITH DIFFERENTIAL/PLATELET
Abs Immature Granulocytes: 0.07 K/uL (ref 0.00–0.07)
Basophils Absolute: 0 K/uL (ref 0.0–0.1)
Basophils Relative: 0 %
Eosinophils Absolute: 0.3 K/uL (ref 0.0–0.5)
Eosinophils Relative: 2 %
HCT: 24.7 % — ABNORMAL LOW (ref 36.0–46.0)
Hemoglobin: 8.3 g/dL — ABNORMAL LOW (ref 12.0–15.0)
Immature Granulocytes: 1 %
Lymphocytes Relative: 21 %
Lymphs Abs: 3 K/uL (ref 0.7–4.0)
MCH: 27.7 pg (ref 26.0–34.0)
MCHC: 33.6 g/dL (ref 30.0–36.0)
MCV: 82.3 fL (ref 80.0–100.0)
Monocytes Absolute: 1.3 K/uL — ABNORMAL HIGH (ref 0.1–1.0)
Monocytes Relative: 9 %
Neutro Abs: 10.2 K/uL — ABNORMAL HIGH (ref 1.7–7.7)
Neutrophils Relative %: 67 %
Platelets: 222 K/uL (ref 150–400)
RBC: 3 MIL/uL — ABNORMAL LOW (ref 3.87–5.11)
RDW: 17.5 % — ABNORMAL HIGH (ref 11.5–15.5)
WBC: 14.9 K/uL — ABNORMAL HIGH (ref 4.0–10.5)
nRBC: 0.1 % (ref 0.0–0.2)

## 2022-04-09 LAB — BASIC METABOLIC PANEL WITH GFR
Anion gap: 7 (ref 5–15)
BUN: 6 mg/dL — ABNORMAL LOW (ref 8–23)
CO2: 23 mmol/L (ref 22–32)
Calcium: 8.8 mg/dL — ABNORMAL LOW (ref 8.9–10.3)
Chloride: 108 mmol/L (ref 98–111)
Creatinine, Ser: 0.83 mg/dL (ref 0.44–1.00)
GFR, Estimated: >60 mL/min
Glucose, Bld: 115 mg/dL — ABNORMAL HIGH (ref 70–99)
Potassium: 3.8 mmol/L (ref 3.5–5.1)
Sodium: 138 mmol/L (ref 135–145)

## 2022-04-09 MED ORDER — DICYCLOMINE HCL 20 MG PO TABS: 20.0000 mg | ORAL_TABLET | Freq: Three times a day (TID) | ORAL | 0 refills | Status: DC | PRN

## 2022-04-09 MED ORDER — ONDANSETRON HCL 4 MG PO TABS: 4.0000 mg | ORAL_TABLET | Freq: Every day | ORAL | 0 refills | Status: DC | PRN

## 2022-04-09 MED ORDER — SIMETHICONE 80 MG PO CHEW: 80.0000 mg | CHEWABLE_TABLET | Freq: Four times a day (QID) | ORAL | 0 refills | Status: DC | PRN

## 2022-04-09 NOTE — Progress Notes (Signed)
Pt had a medium brown BM this morning with red streaks. MD made aware and this is thought to be old blood. Labs resulted and MD aware.  Pt. Discharged to home via private vehicle. Discharge instructions and medication regimen reviewed at bedside with patient. Pt. verbalizes understanding of instructions and medication regimen. Patient assessment unchanged from this morning. TELE and IV discontinued per policy.

## 2022-04-09 NOTE — Progress Notes (Signed)
Nutrition Brief Note  RD received consult for nutritional assessment.   66 y/o female with h/o diverticulosis with recurrent bleeding s/p angiography and microbead embolization of the colic and sigmoid arteries 01/23/22 and who is now admitted with recurrent GIB now s/p IR embolization of distal left colic supply 9/17 and s/p vascular embolization on 9/18.   Met with pt in room today. Pt reports good appetite and oral intake pta and in hospital. Pt is eating 100% of meals. Pt reports eating some oatmeal for breakfast this morning. Pt with questions about her diet once she gets home. RD discussed with patient recommendations for low fiber diet initially until GIB resolves, then slow transition back to high fiber diet. Recommended daily MVI and protein drinks to help restore hemoglobin. RD answered all patient's questions. Plan is for pt to discharge today.   Wt Readings from Last 15 Encounters:  04/06/22 99.6 kg  03/12/22 102.2 kg  02/27/22 90.7 kg  02/12/22 102.3 kg  02/12/22 100.7 kg  01/29/22 100.7 kg  01/23/22 104 kg  01/14/22 102 kg  12/04/21 103.4 kg  10/23/21 101.6 kg  10/14/21 102.5 kg  09/12/21 102.5 kg  07/29/21 102.1 kg  07/25/21 102.2 kg  07/08/21 100.5 kg    Body mass index is 37.69 kg/m. Patient meets criteria for obesity based on current BMI.   Current diet order is HH, patient is consuming approximately 100% of meals at this time. Labs and medications reviewed.   No nutrition interventions warranted at this time. If nutrition issues arise, please consult RD.   Casey Campbell MS, RD, LDN Please refer to AMION for RD and/or RD on-call/weekend/after hours pager   

## 2022-04-09 NOTE — Discharge Summary (Signed)
Physician Discharge Summary  Cheryl Coleman ZOX:[id number] DOB: Nov 11, 1955 DOA: 04/03/2022  PCP: [name], [name], MD  Admit date: 04/03/2022 Discharge date: 04/09/2022  Admitted From: Home Disposition:  Home  Recommendations for Outpatient Follow-up:  Follow up with PCP in 1-2 weeks   Home Health:No Equipment/Devices:None   Discharge Condition:Stable  CODE STATUS:FULL  Diet recommendation: Soft/bland  Brief/Interim Summary: 66 year old female past medical history of arthritis, asthma, GERD, hyperlipidemia, tachycardia and hypertension.  She recently had a diverticular bleed requiring embolization to the descending colon and sigmoid colon.  He came back with GI bleed.  Initial CT angio was negative for bleeding.  Bleeding scan on 04/04/2022 was positive after 75 minutes in the distal transverse colon.  Patient was given 1 unit of packed red blood cells on 04/04/2022.  With continued bleeding the patient did have a coil embolization by Dr. Fredia Sorrow on 04/05/2022.  The patient continued to bleed and became hypotensive requiring 2 units of packed red blood cells on the evening of 04/05/2022.  General surgery was consulted.  Dr. Wyn Quaker vascular surgery took for microbleed embolization of the colonic branch of the SMA and splenic flexure branch on 04/06/2022.  Had one bleeding episode after procedure yesterday but no further bleeding.   Passing some clots on 9/20.  Hemoglobin remained stable at 8.4.  Surgery following.  No immediate plans for colectomy but patient understands that should she develop hemodynamically significant bleeding then a colectomy would be the next.  Seen and examined on 9/21.  Patient did have bowel movement with some red streaks.  Hemoglobin stable at 8.4.  No signs of hemodynamically significant blood loss.  Stable for discharge at this time.  Patient will follow-up with PCP in 1 week for repeat lab work.  Can follow-up as needed with general surgery for consideration of  colectomy should bleeding recur.    Discharge Diagnoses:  Principal Problem:   Colonic hemorrhage Active Problems:   Acute blood loss anemia   Hypovolemic shock (HCC)   Essential hypertension   Gastroesophageal reflux disease   Asthma   Gastrointestinal hemorrhage   Diverticulosis of colon with hemorrhage   Hypomagnesemia   Hematochezia  * Colonic hemorrhage Dr. Fredia Sorrow did a coil embolization on 04/05/2022.  The patient was still bleeding and the patient had a microbeed colonic embolization by Dr. Wyn Quaker on 04/06/2022 (distal transverse colonic branch of SMA and splenic flexure branch and proximal left colonic branch).  During the hospital course the patient has received 3 units of packed red blood cells.  Latest hemoglobin is 8.4.  IV iron given on 04/07/2022.  Vascular surgery signed off 9/19.  General surgery saw on day of discharge.  Hemoglobin stable at 8.4.  Patient did have BM with red streaks.  Considering hemodynamic stability suspect this may be representative of old blood.  I do not suspect hemodynamically significant bleeding at this time.  Patient stable for discharge.  Follow-up with PCP in 1 week for repeat lab work.    Acute blood loss anemia Hemoglobin 12.3 upon admission.  Patient received 1 unit of packed red blood cells on 04/04/2022.  2 units of packed red blood cells overnight on 04/05/2022.  Most recent hemoglobin 8.4.  IV iron given on 04/07/2022.  No further need for transfusion at this time   Hypovolemic shock (HCC) Resolved   Hypomagnesemia Replaced   Asthma Asymptomatic. Continue home regimen.    Gastroesophageal reflux disease P.o. PPI twice daily   Essential hypertension Can resume Cardizem 120 mg daily  Discharge Instructions  Discharge Instructions     Diet - low sodium heart healthy   Complete by: As directed    Increase activity slowly   Complete by: As directed    No wound care   Complete by: As directed       Allergies as of 04/09/2022        Reactions   Oxycodone Other (See Comments)   Feels bad   Dilaudid [hydromorphone] Other (See Comments)   Too drowsy        Medication List     TAKE these medications    albuterol 108 (90 Base) MCG/ACT inhaler Commonly known as: VENTOLIN HFA INHALE 1-2 PUFFS BY MOUTH EVERY 6 (SIX) HOURS AS NEEDED   atorvastatin 10 MG tablet Commonly known as: LIPITOR TAKE 1 TABLET (10 MG TOTAL) BY MOUTH DAILY AT 6 PM.   citalopram 10 MG tablet Commonly known as: CeleXA Take 1 tablet (10 mg total) by mouth daily.   clotrimazole-betamethasone cream Commonly known as: Lotrisone Apply 1 Application topically 2 (two) times daily.   dicyclomine 20 MG tablet Commonly known as: BENTYL Take 1 tablet (20 mg total) by mouth 3 (three) times daily as needed for spasms.   diltiazem 120 MG 24 hr capsule Commonly known as: Cardizem CD Take 1 capsule (120 mg total) by mouth daily. qhs What changed: Another medication with the same name was removed. Continue taking this medication, and follow the directions you see here.   fluticasone 50 MCG/ACT nasal spray Commonly known as: FLONASE SPRAY 2 SPRAYS INTO EACH NOSTRIL EVERY DAY What changed: See the new instructions.   montelukast 10 MG tablet Commonly known as: SINGULAIR TAKE 1 TABLET BY MOUTH EVERYDAY AT BEDTIME What changed: See the new instructions.   omeprazole 40 MG capsule Commonly known as: PRILOSEC TAKE 1 CAPSULE BY MOUTH TWICE A DAY   ondansetron 4 MG tablet Commonly known as: Zofran Take 1 tablet (4 mg total) by mouth daily as needed for nausea or vomiting.   simethicone 80 MG chewable tablet Commonly known as: Gas-X Chew 1 tablet (80 mg total) by mouth 4 (four) times daily as needed for flatulence.   terbinafine 250 MG tablet Commonly known as: LAMISIL Take 1 tablet (250 mg total) by mouth daily.   WOMENS VITA PAK PO Take by mouth daily.        Follow-up Information     [name], [name], MD. Schedule an  appointment as soon as possible for a visit in 1 week(s).   Specialty: Internal Medicine Why: Repeat labwork including CBC   wednesday 04-15-2022 at 10 am Contact information: [address] [place] [address] [phone]         Debbe Odea, MD .   Specialties: Cardiology, Radiology Why: per cardiology they will call the patient back Contact information: [address] [place] [address] [phone]                Allergies  Allergen Reactions   Oxycodone Other (See Comments)    Feels bad   Dilaudid [Hydromorphone] Other (See Comments)    Too drowsy    Consultations: Vascular surgery General surgery   Procedures/Studies: PERIPHERAL VASCULAR CATHETERIZATION  Result Date: 04/06/2022 See surgical note for result.  CT ANGIO GI BLEED  Result Date: 04/05/2022 CLINICAL DATA:  Lower GI bleeding EXAM: CTA ABDOMEN AND PELVIS WITHOUT AND WITH CONTRAST TECHNIQUE: Multidetector CT imaging of the abdomen and pelvis was performed using the standard protocol during bolus administration  of intravenous contrast. Multiplanar reconstructed images and MIPs were obtained and reviewed to evaluate the vascular anatomy. RADIATION DOSE REDUCTION: This exam was performed according to the departmental dose-optimization program which includes automated exposure control, adjustment of the mA and/or kV according to patient size and/or use of iterative reconstruction technique. CONTRAST:  OMNIPAQUE IOHEXOL 350 MG/ML SOLN COMPARISON:  CTA GI bleed 04/04/2022. Nuclear medicine GI bleeding scan 04/04/2022 FINDINGS: VASCULAR Aorta: Normal caliber aorta without aneurysm, dissection, vasculitis or significant stenosis. Celiac: Patent without evidence of aneurysm, dissection, vasculitis or significant stenosis. SMA: Patent without evidence of aneurysm, dissection, vasculitis or significant stenosis. Renals: Both renal arteries are patent without evidence of aneurysm,  dissection, vasculitis, fibromuscular dysplasia or significant stenosis. IMA: Patent without evidence of aneurysm, dissection, vasculitis or significant stenosis. Inflow: Patent without evidence of aneurysm, dissection, vasculitis or significant stenosis. Proximal Outflow: Bilateral common femoral and visualized portions of the superficial and profunda femoral arteries are patent without evidence of aneurysm, dissection, vasculitis or significant stenosis. Veins: No obvious venous abnormality within the limitations of this arterial phase study. Review of the MIP images confirms the above findings. NON-VASCULAR Lower chest: No acute abnormality. Hepatobiliary: Gallstones are present. There is no biliary ductal dilatation. The liver is within normal limits. Pancreas: Unremarkable. No pancreatic ductal dilatation or surrounding inflammatory changes. Spleen: Normal in size without focal abnormality. Adrenals/Urinary Tract: Adrenal glands are unremarkable. Kidneys are normal, without renal calculi, focal lesion, or hydronephrosis. Posterior bladder diverticula appear unchanged. There is contrast throughout the bladder. Stomach/Bowel: There is active bleeding seen within the distal transverse colon corresponding to findings from nuclear medicine study. There is no focal wall thickening or inflammation in this region. There are scattered colonic diverticula. No bowel obstruction. The appendix, small bowel and stomach are within normal limits. Lymphatic: No enlarged lymph nodes are identified. Reproductive: Uterus and bilateral adnexa are unremarkable. Other: No abdominal wall hernia or abnormality. No abdominopelvic ascites. Musculoskeletal: No acute or significant osseous findings. IMPRESSION: VASCULAR 1. Active bleeding identified within the distal transverse colon. No focal bowel wall thickening or inflammation. 2. No other acute aortic pathology identified. NON-VASCULAR 1. No other acute localizing process in the  abdomen or pelvis. 2. Colonic diverticulosis. 3. Bladder diverticula. 4. Cholelithiasis. These results were called by telephone at the time of interpretation on 04/05/2022 at 11:42 pm to provider [facility] , who verbally acknowledged these results. Electronically Signed   By: Darliss Cheney M.D.   On: 04/05/2022 23:42   IR Angiogram Visceral Selective  Result Date: 04/05/2022 INDICATION: GI bleed with positive nuclear medicine bleeding scan at the level of the distal transverse colon/splenic flexure. EXAM: 1. ULTRASOUND GUIDANCE FOR VASCULAR ACCESS OF THE RIGHT COMMON FEMORAL ARTERY 2. VISCERAL ARTERIOGRAPHY OF THE INFERIOR MESENTERIC ARTERY 3. ADDITIONAL SELECTIVE ARTERIOGRAPHY OF THE LEFT COLIC ARTERY, SECOND ORDER 4. ADDITIONAL SELECTIVE ARTERIOGRAPHY OF A LEFT COLIC ARTERY BRANCH, THIRD ORDER 5. TRANSCATHETER EMBOLIZATION OF LEFT COLIC ARTERY BRANCH 6. VISCERAL ARTERIOGRAPHY OF THE SUPERIOR MESENTERIC ARTERY MEDICATIONS: NONE ANESTHESIA/SEDATION: Moderate (conscious) sedation was employed during this procedure. A total of Versed 2.0 mg and Fentanyl 100 mcg was administered intravenously by radiology nursing. Moderate Sedation Time: 73 minutes. The patient's level of consciousness and vital signs were monitored continuously by radiology nursing throughout the procedure under my direct supervision. CONTRAST:  40 mL Visipaque 320 FLUOROSCOPY TIME:  Radiation Exposure Index (as provided by the fluoroscopic device): 977 mGy Kerma COMPLICATIONS: None immediate. PROCEDURE: Informed consent was obtained from the patient following explanation of  the procedure, risks, benefits and alternatives. The patient understands, agrees and consents for the procedure. All questions were addressed. A time out was performed prior to the initiation of the procedure. Maximal barrier sterile technique utilized including caps, mask, sterile gowns, sterile gloves, large sterile drape, hand hygiene, and chlorhexidine prep. Ultrasound  was used to confirm patency of the right common femoral artery. A permanent ultrasound image was recorded. Under direct ultrasound guidance, access of the right common femoral artery was performed with a micropuncture set. A 5 French vascular sheath was placed. A 5 French VS 1 catheter was used to selectively catheterize the inferior mesenteric artery. Selective arteriography of the IMA supply was performed. A microcatheter was then introduced via the 5 French catheter and used to selectively catheterize the left colic artery. Selective arteriography was performed. The microcatheter was then advanced into a superior branch of the left colic artery and additional selective arteriography performed. Transcatheter embolization of a superior branch of the left colic artery was then performed with deployment of a single 2 mm diameter Ruby micro coil. After coil deployment, additional arteriography was performed through the microcatheter. The microcatheter and VS1 catheter were removed. A 5 French Cobra catheter was then advanced into the abdominal aorta and used to selectively catheterize the superior mesenteric artery. The catheter was advanced into the proximal trunk of the SMA and selective arteriography performed. The Cobra catheter was removed. Arteriotomy closure was performed with the Angio-Seal device. FINDINGS: IMA arteriography demonstrates no active contrast extravasation, arteriovenous malformation or arterial pseudoaneurysms in the IMA distribution. Selective arteriography at the level of the left colic artery demonstrates supply to the distal transverse colon and splenic flexure. Additional selective arteriography was then performed of a superior branch supplying the distal transverse colon and directed towards cross flow with SMA supply. Given positive bleeding scan at this level, decision was made to place a microcoil to reduce cross flow from the SMA and perfusion pressure to the distal transverse colon  in hopes diminishing further bleeding. Post embolization arteriography demonstrates reduced flow without arterial compromise to the colon. SMA arteriography demonstrates no evidence of active bleeding, arteriovenous malformation or other vascular abnormality. IMPRESSION: Arteriography performed as above, focused to the level of the distal transverse colon and splenic flexure which is supplied by branches of the left colic artery. A single embolization coil was placed in a superior branch of the left colic artery supplying the distal transverse colon and directed towards cross flow with SMA supply to reduce perfusion pressure and try to decrease recurrent bleeding. Electronically Signed   By: Irish Lack M.D.   On: 04/05/2022 20:02   IR US Guide Vasc Access Right  Result Date: 04/05/2022 INDICATION: GI bleed with positive nuclear medicine bleeding scan at the level of the distal transverse colon/splenic flexure. EXAM: 1. ULTRASOUND GUIDANCE FOR VASCULAR ACCESS OF THE RIGHT COMMON FEMORAL ARTERY 2. VISCERAL ARTERIOGRAPHY OF THE INFERIOR MESENTERIC ARTERY 3. ADDITIONAL SELECTIVE ARTERIOGRAPHY OF THE LEFT COLIC ARTERY, SECOND ORDER 4. ADDITIONAL SELECTIVE ARTERIOGRAPHY OF A LEFT COLIC ARTERY BRANCH, THIRD ORDER 5. TRANSCATHETER EMBOLIZATION OF LEFT COLIC ARTERY BRANCH 6. VISCERAL ARTERIOGRAPHY OF THE SUPERIOR MESENTERIC ARTERY MEDICATIONS: NONE ANESTHESIA/SEDATION: Moderate (conscious) sedation was employed during this procedure. A total of Versed 2.0 mg and Fentanyl 100 mcg was administered intravenously by radiology nursing. Moderate Sedation Time: 73 minutes. The patient's level of consciousness and vital signs were monitored continuously by radiology nursing throughout the procedure under my direct supervision. CONTRAST:  40 mL Visipaque 320 FLUOROSCOPY  TIME:  Radiation Exposure Index (as provided by the fluoroscopic device): 977 mGy Kerma COMPLICATIONS: None immediate. PROCEDURE: Informed consent was  obtained from the patient following explanation of the procedure, risks, benefits and alternatives. The patient understands, agrees and consents for the procedure. All questions were addressed. A time out was performed prior to the initiation of the procedure. Maximal barrier sterile technique utilized including caps, mask, sterile gowns, sterile gloves, large sterile drape, hand hygiene, and chlorhexidine prep. Ultrasound was used to confirm patency of the right common femoral artery. A permanent ultrasound image was recorded. Under direct ultrasound guidance, access of the right common femoral artery was performed with a micropuncture set. A 5 French vascular sheath was placed. A 5 French VS 1 catheter was used to selectively catheterize the inferior mesenteric artery. Selective arteriography of the IMA supply was performed. A microcatheter was then introduced via the 5 French catheter and used to selectively catheterize the left colic artery. Selective arteriography was performed. The microcatheter was then advanced into a superior branch of the left colic artery and additional selective arteriography performed. Transcatheter embolization of a superior branch of the left colic artery was then performed with deployment of a single 2 mm diameter Ruby micro coil. After coil deployment, additional arteriography was performed through the microcatheter. The microcatheter and VS1 catheter were removed. A 5 French Cobra catheter was then advanced into the abdominal aorta and used to selectively catheterize the superior mesenteric artery. The catheter was advanced into the proximal trunk of the SMA and selective arteriography performed. The Cobra catheter was removed. Arteriotomy closure was performed with the Angio-Seal device. FINDINGS: IMA arteriography demonstrates no active contrast extravasation, arteriovenous malformation or arterial pseudoaneurysms in the IMA distribution. Selective arteriography at the level of  the left colic artery demonstrates supply to the distal transverse colon and splenic flexure. Additional selective arteriography was then performed of a superior branch supplying the distal transverse colon and directed towards cross flow with SMA supply. Given positive bleeding scan at this level, decision was made to place a microcoil to reduce cross flow from the SMA and perfusion pressure to the distal transverse colon in hopes diminishing further bleeding. Post embolization arteriography demonstrates reduced flow without arterial compromise to the colon. SMA arteriography demonstrates no evidence of active bleeding, arteriovenous malformation or other vascular abnormality. IMPRESSION: Arteriography performed as above, focused to the level of the distal transverse colon and splenic flexure which is supplied by branches of the left colic artery. A single embolization coil was placed in a superior branch of the left colic artery supplying the distal transverse colon and directed towards cross flow with SMA supply to reduce perfusion pressure and try to decrease recurrent bleeding. Electronically Signed   By: Irish Lack M.D.   On: 04/05/2022 20:02   IR EMBO ART  VEN HEMORR LYMPH EXTRAV  INC GUIDE ROADMAPPING  Result Date: 04/05/2022 INDICATION: GI bleed with positive nuclear medicine bleeding scan at the level of the distal transverse colon/splenic flexure. EXAM: 1. ULTRASOUND GUIDANCE FOR VASCULAR ACCESS OF THE RIGHT COMMON FEMORAL ARTERY 2. VISCERAL ARTERIOGRAPHY OF THE INFERIOR MESENTERIC ARTERY 3. ADDITIONAL SELECTIVE ARTERIOGRAPHY OF THE LEFT COLIC ARTERY, SECOND ORDER 4. ADDITIONAL SELECTIVE ARTERIOGRAPHY OF A LEFT COLIC ARTERY BRANCH, THIRD ORDER 5. TRANSCATHETER EMBOLIZATION OF LEFT COLIC ARTERY BRANCH 6. VISCERAL ARTERIOGRAPHY OF THE SUPERIOR MESENTERIC ARTERY MEDICATIONS: NONE ANESTHESIA/SEDATION: Moderate (conscious) sedation was employed during this procedure. A total of Versed 2.0 mg and  Fentanyl 100 mcg was administered intravenously by  radiology nursing. Moderate Sedation Time: 73 minutes. The patient's level of consciousness and vital signs were monitored continuously by radiology nursing throughout the procedure under my direct supervision. CONTRAST:  40 mL Visipaque 320 FLUOROSCOPY TIME:  Radiation Exposure Index (as provided by the fluoroscopic device): 376 mGy Kerma COMPLICATIONS: None immediate. PROCEDURE: Informed consent was obtained from the patient following explanation of the procedure, risks, benefits and alternatives. The patient understands, agrees and consents for the procedure. All questions were addressed. A time out was performed prior to the initiation of the procedure. Maximal barrier sterile technique utilized including caps, mask, sterile gowns, sterile gloves, large sterile drape, hand hygiene, and chlorhexidine prep. Ultrasound was used to confirm patency of the right common femoral artery. A permanent ultrasound image was recorded. Under direct ultrasound guidance, access of the right common femoral artery was performed with a micropuncture set. A 5 French vascular sheath was placed. A 5 French VS 1 catheter was used to selectively catheterize the inferior mesenteric artery. Selective arteriography of the IMA supply was performed. A microcatheter was then introduced via the 5 French catheter and used to selectively catheterize the left colic artery. Selective arteriography was performed. The microcatheter was then advanced into a superior branch of the left colic artery and additional selective arteriography performed. Transcatheter embolization of a superior branch of the left colic artery was then performed with deployment of a single 2 mm diameter Ruby micro coil. After coil deployment, additional arteriography was performed through the microcatheter. The microcatheter and VS1 catheter were removed. A 5 French Cobra catheter was then advanced into the abdominal aorta  and used to selectively catheterize the superior mesenteric artery. The catheter was advanced into the proximal trunk of the SMA and selective arteriography performed. The Cobra catheter was removed. Arteriotomy closure was performed with the Angio-Seal device. FINDINGS: IMA arteriography demonstrates no active contrast extravasation, arteriovenous malformation or arterial pseudoaneurysms in the IMA distribution. Selective arteriography at the level of the left colic artery demonstrates supply to the distal transverse colon and splenic flexure. Additional selective arteriography was then performed of a superior branch supplying the distal transverse colon and directed towards cross flow with SMA supply. Given positive bleeding scan at this level, decision was made to place a microcoil to reduce cross flow from the SMA and perfusion pressure to the distal transverse colon in hopes diminishing further bleeding. Post embolization arteriography demonstrates reduced flow without arterial compromise to the colon. SMA arteriography demonstrates no evidence of active bleeding, arteriovenous malformation or other vascular abnormality. IMPRESSION: Arteriography performed as above, focused to the level of the distal transverse colon and splenic flexure which is supplied by branches of the left colic artery. A single embolization coil was placed in a superior branch of the left colic artery supplying the distal transverse colon and directed towards cross flow with SMA supply to reduce perfusion pressure and try to decrease recurrent bleeding. Electronically Signed   By: Aletta Edouard M.D.   On: 04/05/2022 20:02   IR Angiogram Visceral Selective  Result Date: 04/05/2022 INDICATION: GI bleed with positive nuclear medicine bleeding scan at the level of the distal transverse colon/splenic flexure. EXAM: 1. ULTRASOUND GUIDANCE FOR VASCULAR ACCESS OF THE RIGHT COMMON FEMORAL ARTERY 2. VISCERAL ARTERIOGRAPHY OF THE INFERIOR  MESENTERIC ARTERY 3. ADDITIONAL SELECTIVE ARTERIOGRAPHY OF THE LEFT COLIC ARTERY, SECOND ORDER 4. ADDITIONAL SELECTIVE ARTERIOGRAPHY OF A LEFT COLIC ARTERY BRANCH, THIRD ORDER 5. TRANSCATHETER EMBOLIZATION OF LEFT COLIC ARTERY BRANCH 6. VISCERAL ARTERIOGRAPHY OF THE SUPERIOR MESENTERIC  ARTERY MEDICATIONS: NONE ANESTHESIA/SEDATION: Moderate (conscious) sedation was employed during this procedure. A total of Versed 2.0 mg and Fentanyl 100 mcg was administered intravenously by radiology nursing. Moderate Sedation Time: 73 minutes. The patient's level of consciousness and vital signs were monitored continuously by radiology nursing throughout the procedure under my direct supervision. CONTRAST:  40 mL Visipaque 320 FLUOROSCOPY TIME:  Radiation Exposure Index (as provided by the fluoroscopic device): 977 mGy Kerma COMPLICATIONS: None immediate. PROCEDURE: Informed consent was obtained from the patient following explanation of the procedure, risks, benefits and alternatives. The patient understands, agrees and consents for the procedure. All questions were addressed. A time out was performed prior to the initiation of the procedure. Maximal barrier sterile technique utilized including caps, mask, sterile gowns, sterile gloves, large sterile drape, hand hygiene, and chlorhexidine prep. Ultrasound was used to confirm patency of the right common femoral artery. A permanent ultrasound image was recorded. Under direct ultrasound guidance, access of the right common femoral artery was performed with a micropuncture set. A 5 French vascular sheath was placed. A 5 French VS 1 catheter was used to selectively catheterize the inferior mesenteric artery. Selective arteriography of the IMA supply was performed. A microcatheter was then introduced via the 5 French catheter and used to selectively catheterize the left colic artery. Selective arteriography was performed. The microcatheter was then advanced into a superior branch of the  left colic artery and additional selective arteriography performed. Transcatheter embolization of a superior branch of the left colic artery was then performed with deployment of a single 2 mm diameter Ruby micro coil. After coil deployment, additional arteriography was performed through the microcatheter. The microcatheter and VS1 catheter were removed. A 5 French Cobra catheter was then advanced into the abdominal aorta and used to selectively catheterize the superior mesenteric artery. The catheter was advanced into the proximal trunk of the SMA and selective arteriography performed. The Cobra catheter was removed. Arteriotomy closure was performed with the Angio-Seal device. FINDINGS: IMA arteriography demonstrates no active contrast extravasation, arteriovenous malformation or arterial pseudoaneurysms in the IMA distribution. Selective arteriography at the level of the left colic artery demonstrates supply to the distal transverse colon and splenic flexure. Additional selective arteriography was then performed of a superior branch supplying the distal transverse colon and directed towards cross flow with SMA supply. Given positive bleeding scan at this level, decision was made to place a microcoil to reduce cross flow from the SMA and perfusion pressure to the distal transverse colon in hopes diminishing further bleeding. Post embolization arteriography demonstrates reduced flow without arterial compromise to the colon. SMA arteriography demonstrates no evidence of active bleeding, arteriovenous malformation or other vascular abnormality. IMPRESSION: Arteriography performed as above, focused to the level of the distal transverse colon and splenic flexure which is supplied by branches of the left colic artery. A single embolization coil was placed in a superior branch of the left colic artery supplying the distal transverse colon and directed towards cross flow with SMA supply to reduce perfusion pressure and  try to decrease recurrent bleeding. Electronically Signed   By: Irish LackGlenn  Yamagata M.D.   On: 04/05/2022 20:02   NM GI Blood Loss  Result Date: 04/04/2022 CLINICAL DATA:  Blood loss, hematochezia EXAM: NUCLEAR MEDICINE GASTROINTESTINAL BLEEDING SCAN TECHNIQUE: Sequential abdominal images were obtained following intravenous administration of Tc-2622m labeled red blood cells. RADIOPHARMACEUTICALS:  21.53 mCi Tc-3422m pertechnetate in-vitro labeled red cells. COMPARISON:  CT GI bleed examination 04/04/2022 FINDINGS: Expected blood pool and urinary tract  expiratory radiotracer accumulation. Peristalsing colonic radiotracer activity is identified at approximately 75 minutes of imaging and originates in the distal transverse colon. IMPRESSION: Peristalsing colonic radiotracer activity is identified at approximately 75 minutes of imaging and originates in the distal transverse colon. These results will be called to the ordering clinician or representative by the Radiologist Assistant, and communication documented in the PACS or [employer]. Electronically Signed   By: Jearld Lesch M.D.   On: 04/04/2022 17:03   CT ANGIO GI BLEED  Result Date: 04/04/2022 CLINICAL DATA:  Lower GI bleed EXAM: CTA ABDOMEN AND PELVIS WITHOUT AND WITH CONTRAST TECHNIQUE: Multidetector CT imaging of the abdomen and pelvis was performed using the standard protocol during bolus administration of intravenous contrast. Multiplanar reconstructed images and MIPs were obtained and reviewed to evaluate the vascular anatomy. RADIATION DOSE REDUCTION: This exam was performed according to the departmental dose-optimization program which includes automated exposure control, adjustment of the mA and/or kV according to patient size and/or use of iterative reconstruction technique. CONTRAST:  OMNIPAQUE IOHEXOL 350 MG/ML SOLN COMPARISON:  CTA GI bleed dated 01/23/2022 FINDINGS: VASCULAR Patent abdominal vasculature.  Atherosclerotic calcifications.  No evidence of active GI bleeding. No contrast extravasation into the lumen of the colon. Review of the MIP images confirms the above findings. NON-VASCULAR Lower chest: Lung bases are essentially clear. Hepatobiliary: Liver is within normal limits. Layering gallstones (series 8/image 31), without associated inflammatory changes. No intrahepatic or extrahepatic duct dilatation. Pancreas: Within normal limits. Spleen: Within normal limits. Adrenals/Urinary Tract: Adrenal glands are within normal limits. Kidneys are within normal limits.  No hydronephrosis. Bladder is within normal limits. Stomach/Bowel: Stomach is within normal limits. No evidence of bowel obstruction. Numerous left colonic diverticuli, without associated diverticulitis. Lymphatic: No suspicious abdominopelvic lymphadenopathy. Reproductive: Uterus is within normal limits. Bilateral ovaries are within normal limits. Other: No abdominopelvic ascites. Musculoskeletal: Mild degenerative changes at L4-5. IMPRESSION: No evidence of active GI bleeding. Left colonic diverticulosis, without evidence of diverticulitis. Cholelithiasis, without associated inflammatory changes. Electronically Signed   By: Charline Bills M.D.   On: 04/04/2022 00:56      Subjective: Seen and examined on the day of discharge.  Stable no distress.  Hemodynamics improved.  Stable for discharge home.  Discharge Exam: Vitals:   04/09/22 0907 04/09/22 0908  BP: 126/66   Pulse: (!) 102 (!) 107  Resp:    Temp:    SpO2: 98% 98%   Vitals:   04/08/22 1943 04/09/22 0900 04/09/22 0907 04/09/22 0908  BP: (!) 114/98  126/66   Pulse: 100 (!) 108 (!) 102 (!) 107  Resp: 20 18    Temp: 98.7 F (37.1 C) 98.6 F (37 C)    TempSrc: Oral Axillary    SpO2: 96% 97% 98% 98%  Weight:      Height:        General: Pt is alert, awake, not in acute distress Cardiovascular: RRR, S1/S2 +, no rubs, no gallops Respiratory: CTA bilaterally, no wheezing, no rhonchi Abdominal: Soft,  NT, ND, bowel sounds + Extremities: no edema, no cyanosis    The results of significant diagnostics from this hospitalization (including imaging, microbiology, ancillary and laboratory) are listed below for reference.     Microbiology: Recent Results (from the past 240 hour(s))  MRSA Next Gen by PCR, Nasal     Status: None   Collection Time: 04/04/22  8:21 PM   Specimen: Nasal Mucosa; Nasal Swab  Result Value Ref Range Status   MRSA by PCR  Next Gen NOT DETECTED NOT DETECTED Final    Comment: (NOTE) The GeneXpert MRSA Assay (FDA approved for NASAL specimens only), is one component of a comprehensive MRSA colonization surveillance program. It is not intended to diagnose MRSA infection nor to guide or monitor treatment for MRSA infections. Test performance is not FDA approved in patients less than 53 years old. Performed at [facility], [address] [place], [place] [address]      Labs: BNP (last 3 results) No results for input(s): "BNP" in the last 8760 hours. Basic Metabolic Panel: Recent Labs  Lab 04/05/22 0437 04/06/22 0658 04/07/22 0610 04/08/22 0449 04/09/22 0951  NA 141 141 140 141 138  K 3.6 3.7 3.5 3.4* 3.8  CL 110 113* 107 110 108  CO2 GLUCOSE 104* 116* 114* 95 115*  BUN 11 7* <5* 7* 6*  CREATININE 0.88 0.77 0.74 0.78 0.83  CALCIUM 8.7* 8.1* 8.2* 8.4* 8.8*  MG 1.9 1.6*  --   --   --   PHOS  --  3.6  --   --   --    Liver Function Tests: Recent Labs  Lab 04/03/22 0910  AST 19  ALT 18  ALKPHOS 62  BILITOT 0.6  PROT 8.0  ALBUMIN 3.9   No results for input(s): "LIPASE", "AMYLASE" in the last 168 hours. No results for input(s): "AMMONIA" in the last 168 hours. CBC: Recent Labs  Lab 04/03/22 0910 04/03/22 1400 04/06/22 0658 04/06/22 1056 04/06/22 2217 04/07/22 0610 04/08/22 0449 04/08/22 1508 04/09/22 1035  WBC 9.1  --  13.7*  --   --  14.8* 13.6*  --  14.9*  NEUTROABS  --   --   --   --   --   --   --   --   10.2*  HGB 12.3   < > 8.8*   < > 8.3* 8.4* 8.1* 8.4* 8.3*  HCT 35.8*   < > 25.5*   < > 24.3* 24.9* 23.8* 24.9* 24.7*  MCV 76.8*  --  80.2  --   --  80.8 81.8  --  82.3  PLT 258  --  147*  --   --  159 172  --  222   < > = values in this interval not displayed.   Cardiac Enzymes: No results for input(s): "CKTOTAL", "CKMB", "CKMBINDEX", "TROPONINI" in the last 168 hours. BNP: Invalid input(s): "POCBNP" CBG: Recent Labs  Lab 04/04/22 2015  GLUCAP 105*   D-Dimer No results for input(s): "DDIMER" in the last 72 hours. Hgb A1c No results for input(s): "HGBA1C" in the last 72 hours. Lipid Profile No results for input(s): "CHOL", "HDL", "LDLCALC", "TRIG", "CHOLHDL", "LDLDIRECT" in the last 72 hours. Thyroid function studies No results for input(s): "TSH", "T4TOTAL", "T3FREE", "THYROIDAB" in the last 72 hours.  Invalid input(s): "FREET3" Anemia work up No results for input(s): "VITAMINB12", "FOLATE", "FERRITIN", "TIBC", "IRON", "RETICCTPCT" in the last 72 hours. Urinalysis    Component Value Date/Time   COLORURINE YELLOW 07/08/2021 1135   APPEARANCEUR CLEAR 07/08/2021 1135   APPEARANCEUR Clear 08/29/2018 0848   LABSPEC 1.012 07/08/2021 1135   PHURINE 5.5 07/08/2021 1135   GLUCOSEU NEGATIVE 07/08/2021 1135   HGBUR NEGATIVE 07/08/2021 1135   BILIRUBINUR NEGATIVE 07/04/2020 1744   BILIRUBINUR Negative 08/29/2018 0848   KETONESUR NEGATIVE 07/08/2021 1135   PROTEINUR TRACE (A) 07/08/2021 1135   NITRITE NEGATIVE 07/08/2021 1135   LEUKOCYTESUR NEGATIVE 07/08/2021 1135   Sepsis  Labs Recent Labs  Lab 04/06/22 0658 04/07/22 0610 04/08/22 0449 04/09/22 1035  WBC 13.7* 14.8* 13.6* 14.9*   Microbiology Recent Results (from the past 240 hour(s))  MRSA Next Gen by PCR, Nasal     Status: None   Collection Time: 04/04/22  8:21 PM   Specimen: Nasal Mucosa; Nasal Swab  Result Value Ref Range Status   MRSA by PCR Next Gen NOT DETECTED NOT DETECTED Final    Comment: (NOTE) The  GeneXpert MRSA Assay (FDA approved for NASAL specimens only), is one component of a comprehensive MRSA colonization surveillance program. It is not intended to diagnose MRSA infection nor to guide or monitor treatment for MRSA infections. Test performance is not FDA approved in patients less than 1 years old. Performed at [facility], [address], [place], [place] [address]      Time coordinating discharge: Over 30 minutes  SIGNED:   Tresa Moore, MD  [facility] 04/09/2022, 1:38 PM Pager   If 7PM-7AM, please contact night-coverage

## 2022-04-09 NOTE — Progress Notes (Signed)
[facility] SURGICAL PROGRESS NOTE (cpt [id number])  Hospital Day(s): 5.   Post op day(s): 3 Days Post-Op.   Interval History: Patient seen and examined, no acute events or new complaints overnight. Patient reports she is doing well; no abdominal pain. No fever, chills, nausea, emesis. Hgb checked after old blood in stool noted; this was actually improved to 8.4. This morning, labs are pending She is on heart healthy diet; tolerating. Again, she had another BM last night; this had old blood in it.   Review of Systems:  Constitutional: denies fever, chills  HEENT: denies cough or congestion  Respiratory: denies any shortness of breath  Cardiovascular: denies chest pain or palpitations  Gastrointestinal: denies abdominal pain, N/V, no more bloody bowel movements.  Genitourinary: denies burning with urination or urinary frequency Musculoskeletal: denies pain, decreased motor or sensation  Vital signs in last 24 hours: [min-max] current  Temp:  [98 F (36.7 C)-98.7 F (37.1 C)] 98.7 F (37.1 C) (09/20 1943) Pulse Rate:  [87-100] 100 (09/20 1943) Resp:  [20-21] 20 (09/20 1943) BP: (114-134)/(65-98) 114/98 (09/20 1943) SpO2:  [96 %] 96 % (09/20 1943)     Height: 5\' 4"  (162.6 cm) Weight: 99.6 kg BMI (Calculated): 37.67   Intake/Output last 2 shifts:  09/20 0701 - 09/21 0700 In: 985 [P.O.:960; I.V.:25] Out: -    Physical Exam:  Constitutional: alert, cooperative and no distress  HENT: normocephalic without obvious abnormality  Eyes: PERRL, EOM's grossly intact and symmetric  Respiratory: breathing non-labored at rest  Cardiovascular: borderline tachycardia to 102 bpm and sinus rhythm  Gastrointestinal: soft, non-tender, and non-distended; no rebound/guarding Musculoskeletal: no edema or wounds, motor and sensation grossly intact, NT    Labs:     Latest Ref Rng & Units 04/08/2022    3:08 PM 04/08/2022    4:49 AM 04/07/2022    6:10 AM  CBC  WBC 4.0 - 10.5 K/uL  13.6   14.8   Hemoglobin 12.0 - 15.0 g/dL 8.4  8.1  8.4   Hematocrit 36.0 - 46.0 % 24.9  23.8  24.9   Platelets 150 - 400 K/uL  172  159       Latest Ref Rng & Units 04/08/2022    4:49 AM 04/07/2022    6:10 AM 04/06/2022    6:58 AM  CMP  Glucose 70 - 99 mg/dL 95  04/08/2022  009   BUN 8 - 23 mg/dL 7  <5  7   Creatinine 233 - 1.00 mg/dL 0.07  6.22  6.33   Sodium 135 - 145 mmol/L 141  140  141   Potassium 3.5 - 5.1 mmol/L 3.4  3.5  3.7   Chloride 98 - 111 mmol/L 110  107  113   CO2 22 - 32 mmol/L 26  26  23    Calcium 8.9 - 10.3 mg/dL 8.4  8.2  8.1     Imaging studies: No new pertinent imaging studies   Assessment/Plan: (ICD-10's: K92.1) 66 y.o. hemodynamically stable female without any further evidence of GI bleeding s/p embolization on 09/18   - Suspect the bowel movement with old blood in it last night was residual from previous bleeding episodes. Hgb actually had improved last night. She is hemodynamically stable without abdominal pain as well. No clinical evidence of colonic ischemia either.  - Continue diet as tolerated   - Monitor abdominal examination - Monitor H&H; stable - Pain control prn; antiemetics prn   - Further management per primary service  -  Discharge Planning; Plan for repeat labs this morning. If these look good, I agree it would be reasonable to discharge home. Nothing to add from surgical perspective. I will leave our contact information but she certainly does NOT need surgical follow up.   All of the above findings and recommendations were discussed with the patient, patient's family (husband at bedside), and the medical team, and all of their questions were answered to their expressed satisfaction.  -- Edison Simon, PA-C [facility] 04/09/2022, 8:10 AM M-F: 7am - 4pm

## 2022-04-15 ENCOUNTER — Ambulatory Visit (INDEPENDENT_AMBULATORY_CARE_PROVIDER_SITE_OTHER): Payer: Medicare HMO | Admitting: Internal Medicine

## 2022-04-15 ENCOUNTER — Encounter: Payer: Self-pay | Admitting: Internal Medicine

## 2022-04-15 ENCOUNTER — Telehealth: Payer: Self-pay | Admitting: Internal Medicine

## 2022-04-15 VITALS — BP 118/68 | HR 86 | Temp 98.1°F | Ht 64.0 in | Wt 217.2 lb

## 2022-04-15 DIAGNOSIS — D500 Iron deficiency anemia secondary to blood loss (chronic): Secondary | ICD-10-CM

## 2022-04-15 DIAGNOSIS — Z23 Encounter for immunization: Secondary | ICD-10-CM | POA: Diagnosis not present

## 2022-04-15 DIAGNOSIS — R600 Localized edema: Secondary | ICD-10-CM

## 2022-04-15 DIAGNOSIS — Z1231 Encounter for screening mammogram for malignant neoplasm of breast: Secondary | ICD-10-CM

## 2022-04-15 DIAGNOSIS — D62 Acute posthemorrhagic anemia: Secondary | ICD-10-CM

## 2022-04-15 DIAGNOSIS — R0602 Shortness of breath: Secondary | ICD-10-CM

## 2022-04-15 DIAGNOSIS — K5791 Diverticulosis of intestine, part unspecified, without perforation or abscess with bleeding: Secondary | ICD-10-CM | POA: Diagnosis not present

## 2022-04-15 LAB — CBC WITH DIFFERENTIAL/PLATELET
Basophils Absolute: 0.1 K/uL (ref 0.0–0.1)
Basophils Relative: 0.9 % (ref 0.0–3.0)
Eosinophils Absolute: 0.3 K/uL (ref 0.0–0.7)
Eosinophils Relative: 3.4 % (ref 0.0–5.0)
HCT: 27.8 % — ABNORMAL LOW (ref 36.0–46.0)
Hemoglobin: 9.6 g/dL — ABNORMAL LOW (ref 12.0–15.0)
Lymphocytes Relative: 21.8 % (ref 12.0–46.0)
Lymphs Abs: 2.2 K/uL (ref 0.7–4.0)
MCHC: 34.4 g/dL (ref 30.0–36.0)
MCV: 83.8 fl (ref 78.0–100.0)
Monocytes Absolute: 0.7 K/uL (ref 0.1–1.0)
Monocytes Relative: 6.9 % (ref 3.0–12.0)
Neutro Abs: 6.7 K/uL (ref 1.4–7.7)
Neutrophils Relative %: 67 % (ref 43.0–77.0)
Platelets: 385 K/uL (ref 150.0–400.0)
RBC: 3.32 Mil/uL — ABNORMAL LOW (ref 3.87–5.11)
RDW: 18.2 % — ABNORMAL HIGH (ref 11.5–15.5)
WBC: 10 K/uL (ref 4.0–10.5)

## 2022-04-15 LAB — IBC + FERRITIN
Ferritin: 146.7 ng/mL (ref 10.0–291.0)
Iron: 46 ug/dL (ref 42–145)
Saturation Ratios: 12.4 % — ABNORMAL LOW (ref 20.0–50.0)
TIBC: 371 ug/dL (ref 250.0–450.0)
Transferrin: 265 mg/dL (ref 212.0–360.0)

## 2022-04-15 NOTE — Telephone Encounter (Signed)
Patient requesting to transfer to Dr. Derrel Nip . Mother-in-law Joellyn Rued. Sister-in-law is Maryruth Hancock

## 2022-04-15 NOTE — Progress Notes (Signed)
Chief Complaint  Patient presents with   Hospitalization Follow-up    Pt had  a GI bleed   HFU 1. [facility] hospitalized 9/15-9/21/23 for diverticular bleed this is 2nd episode in 2 months but 3rd in lifetime she was anemic and transfused 2 units of blood and 2nd embolization in 2 months by vascular [facility] surgery was consulted and rec if happens again resection with colestomy bag which may be long term or reversible  She had hypotensive shock requiring ivf and blood and benicar 10 mg was stopped  H/o palpitations due to f/u with cardiology [place] she is only taking dilt 120 qd and bp running low normal with lightheadness at times and she wants to know if cards has another option for palpitations which will not make her BP run low? Sent cards a message to get pt in for appt After all of the fluids she feels chest heaviness echo done in 2022 normal ef but pt wants BNP lab   Review of Systems  Constitutional:  Negative for weight loss.  HENT:  Negative for hearing loss.   Eyes:  Negative for blurred vision.  Respiratory:  Negative for shortness of breath.   Cardiovascular:  Negative for chest pain.  Gastrointestinal:  Negative for abdominal pain and blood in stool.  Genitourinary:  Negative for dysuria.  Musculoskeletal:  Negative for falls and joint pain.  Skin:  Negative for rash.  Neurological:  Negative for headaches.  Psychiatric/Behavioral:  Negative for depression.    Past Medical History:  Diagnosis Date   Arthritis    Asthma    COVID    COVID-19    07/24/20, 05/19/21   Diverticulitis    GERD (gastroesophageal reflux disease)    GIB (gastrointestinal bleeding)    diverticular 01/23/22-01/24/22 [facility] s/p emobolization & 15 years ago in [place]   Hiatal hernia    History of blood transfusion    History of chicken pox    History of GI bleed    2013/14   Hyperlipidemia    Hypertension    Leukocytosis    Type O blood, Rh positive    Past Surgical History:  Procedure Laterality  Date   BREAST CYST ASPIRATION Right    CESAREAN SECTION     04/27/84   COLONOSCOPY     COLONOSCOPY WITH PROPOFOL N/A 02/27/2022   Procedure: COLONOSCOPY WITH PROPOFOL;  Surgeon: [name], MD;  Location: [facility] ENDOSCOPY;  Service: Gastroenterology;  Laterality: N/A;   EMBOLIZATION N/A 01/23/2022   Procedure: EMBOLIZATION;  Surgeon: [name], MD;  Location: [facility] INVASIVE CV LAB;  Service: Cardiovascular;  Laterality: N/A;   EMBOLIZATION N/A 04/06/2022   Procedure: EMBOLIZATION;  Surgeon: Annice Needy, MD;  Location: [facility] INVASIVE CV LAB;  Service: Cardiovascular;  Laterality: N/A;   ESOPHAGOGASTRODUODENOSCOPY (EGD) WITH PROPOFOL N/A 08/29/2020   Procedure: ESOPHAGOGASTRODUODENOSCOPY (EGD) WITH PROPOFOL;  Surgeon: [name], MD;  Location: [facility] ENDOSCOPY;  Service: Gastroenterology;  Laterality: N/A;   IR ANGIOGRAM VISCERAL SELECTIVE  04/05/2022   IR ANGIOGRAM VISCERAL SELECTIVE  04/05/2022   IR EMBO ART  VEN HEMORR LYMPH EXTRAV  INC GUIDE ROADMAPPING  04/05/2022   IR US GUIDE VASC ACCESS RIGHT  04/05/2022   ORIF ACETABULAR FRACTURE     Family History  Problem Relation Age of Onset   Hypertension Maternal Aunt    Diabetes Maternal Aunt    Breast cancer Neg Hx    Social History   Socioeconomic History   Marital status: Married  Spouse name: Not on file   Number of children: Not on file   Years of education: Not on file   Highest education level: Not on file  Occupational History   Not on file  Tobacco Use   Smoking status: Never   Smokeless tobacco: Never  Vaping Use   Vaping Use: Never used  Substance and Sexual Activity   Alcohol use: Not Currently   Drug use: Not Currently   Sexual activity: Yes    Comment: husband   Other Topics Concern   Not on file  Social History Narrative   Lived in [place] from [place]    Married 1st husband died in late 90s/2000s   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, wears seat belt, safe in relationship    Social Determinants  of Health   Financial Resource Strain: Not on file  Food Insecurity: No Food Insecurity (04/06/2022)   Hunger Vital Sign    Worried About Running Out of Food in the Last Year: Never true    Ran Out of Food in the Last Year: Never true  Transportation Needs: No Transportation Needs (04/06/2022)   PRAPARE - Administrator, Civil Service (Medical): No    Lack of Transportation (Non-Medical): No  Physical Activity: Not on file  Stress: Not on file  Social Connections: Not on file  Intimate Partner Violence: Not At Risk (04/06/2022)   Humiliation, Afraid, Rape, and Kick questionnaire    Fear of Current or Ex-Partner: No    Emotionally Abused: No    Physically Abused: No    Sexually Abused: No   Current Meds  Medication Sig   albuterol (VENTOLIN HFA) 108 (90 Base) MCG/ACT inhaler INHALE 1-2 PUFFS BY MOUTH EVERY 6 (SIX) HOURS AS NEEDED   atorvastatin (LIPITOR) 10 MG tablet TAKE 1 TABLET (10 MG TOTAL) BY MOUTH DAILY AT 6 PM.   citalopram (CELEXA) 10 MG tablet Take 1 tablet (10 mg total) by mouth daily.   clotrimazole-betamethasone (LOTRISONE) cream Apply 1 Application topically 2 (two) times daily.   diltiazem (CARDIZEM CD) 120 MG 24 hr capsule Take 1 capsule (120 mg total) by mouth daily. qhs   fluticasone (FLONASE) 50 MCG/ACT nasal spray SPRAY 2 SPRAYS INTO EACH NOSTRIL EVERY DAY (Patient taking differently: Place 2 sprays into both nostrils daily as needed for allergies or rhinitis.)   montelukast (SINGULAIR) 10 MG tablet TAKE 1 TABLET BY MOUTH EVERYDAY AT BEDTIME (Patient taking differently: Take 10 mg by mouth at bedtime.)   omeprazole (PRILOSEC) 40 MG capsule TAKE 1 CAPSULE BY MOUTH TWICE A DAY   Specialty Vitamins Products (WOMENS VITA PAK PO) Take by mouth daily.   terbinafine (LAMISIL) 250 MG tablet Take 1 tablet (250 mg total) by mouth daily.   Allergies  Allergen Reactions   Oxycodone Other (See Comments)    Feels bad   Dilaudid [Hydromorphone] Other (See Comments)     Too drowsy   Recent Results (from the past 2160 hour(s))  Comprehensive metabolic panel     Status: Abnormal   Collection Time: 01/23/22  8:01 AM  Result Value Ref Range   Sodium 140 135 - 145 mmol/L   Potassium 3.6 3.5 - 5.1 mmol/L   Chloride 106 98 - 111 mmol/L   CO2 26 22 - 32 mmol/L   Glucose, Bld 104 (H) 70 - 99 mg/dL    Comment: Glucose reference range applies only to samples taken after fasting for at least  8 hours.   BUN 9 8 - 23 mg/dL   Creatinine, Ser 6.96 0.44 - 1.00 mg/dL   Calcium 9.5 8.9 - 29.5 mg/dL   Total Protein 8.6 (H) 6.5 - 8.1 g/dL   Albumin 4.4 3.5 - 5.0 g/dL   AST 22 15 - 41 U/L   ALT 17 0 - 44 U/L   Alkaline Phosphatase 63 38 - 126 U/L   Total Bilirubin 0.7 0.3 - 1.2 mg/dL   GFR, Estimated >28 >41 mL/min    Comment: (NOTE) Calculated using the CKD-EPI Creatinine Equation (2021)    Anion gap 8 5 - 15    Comment: Performed at [facility], [address] [place], [place] [address]  CBC     Status: Abnormal   Collection Time: 01/23/22  8:01 AM  Result Value Ref Range   WBC 9.2 4.0 - 10.5 K/uL   RBC 4.95 3.87 - 5.11 MIL/uL   Hemoglobin 13.3 12.0 - 15.0 g/dL   HCT 10.2 72.5 - 36.6 %   MCV 77.2 (L) 80.0 - 100.0 fL   MCH 26.9 26.0 - 34.0 pg   MCHC 34.8 30.0 - 36.0 g/dL   RDW 44.0 34.7 - 42.5 %   Platelets 270 150 - 400 K/uL   nRBC 0.0 0.0 - 0.2 %    Comment: Performed at [facility], [address] [place], [place] [address]  Type and screen [facility]     Status: None   Collection Time: 01/23/22  8:01 AM  Result Value Ref Range   ABO/RH(D) O POS    Antibody Screen NEG    Sample Expiration 01/26/2022,2359    Unit Number [id number]    Blood Component Type RED CELLS,LR    Unit division 00    Status of Unit ISSUED,FINAL    Transfusion Status OK TO TRANSFUSE    Crossmatch Result COMPATIBLE    Unit Number [id number]    Blood Component Type RED CELLS,LR    Unit division 00    Status of Unit  ISSUED,FINAL    Transfusion Status OK TO TRANSFUSE    Crossmatch Result COMPATIBLE    Unit Number [id number]    Blood Component Type RED CELLS,LR    Unit division 00    Status of Unit REL FROM [facility]    Transfusion Status OK TO TRANSFUSE    Crossmatch Result COMPATIBLE    Unit Number [id number]    Blood Component Type RED CELLS,LR    Unit division 00    Status of Unit REL FROM [facility]    Transfusion Status OK TO TRANSFUSE    Crossmatch Result COMPATIBLE   BPAM RBC     Status: None   Collection Time: 01/23/22  8:01 AM  Result Value Ref Range   ISSUE DATE / TIME 176160737106    Blood Product Unit Number [id number]    PRODUCT CODE K0938H82    Unit Type and Rh 5100    Blood Product Expiration Date 993716967893    ISSUE DATE / TIME 810175102585    Blood Product Unit Number [id number]    PRODUCT CODE E3154M08    Unit Type and Rh 5100    Blood Product Expiration Date 676195093267    Blood Product Unit Number [id number]    PRODUCT CODE N0539J67    Unit Type and Rh 5100    Blood Product Expiration Date 341937902409    Blood Product Unit Number [id number]    PRODUCT CODE M1962I29    Unit  Type and Rh 5100    Blood Product Expiration Date 761950932671   HIV Antibody (routine testing w rflx)     Status: None   Collection Time: 01/23/22  8:18 AM  Result Value Ref Range   HIV Screen 4th Generation wRfx Non Reactive Non Reactive    Comment: Performed at [facility], [address], [place], [place] [address]  Hemoglobin and hematocrit, blood     Status: Abnormal   Collection Time: 01/23/22  8:18 AM  Result Value Ref Range   Hemoglobin 12.4 12.0 - 15.0 g/dL   HCT 35.7 (L) 36.0 - 46.0 %    Comment: Performed at [facility], [address], [place], [place] [address]  Prepare RBC (crossmatch)     Status: None   Collection Time: 01/23/22 12:15 PM  Result Value Ref Range   Order Confirmation      ORDER PROCESSED BY BLOOD BANK Performed at [facility], [address], [place], [place] [address]   Prepare RBC (crossmatch)     Status: None   Collection Time: 01/23/22 12:23 PM  Result Value Ref Range   Order Confirmation      ORDER PROCESSED BY BLOOD BANK Performed at [facility], [address] [place], [place] [address]   ABO/Rh     Status: None   Collection Time: 01/23/22 12:47 PM  Result Value Ref Range   ABO/RH(D)      O POS Performed at [facility], [address] [place], [place] [address]   Glucose, capillary     Status: Abnormal   Collection Time: 01/23/22  3:30 PM  Result Value Ref Range   Glucose-Capillary 114 (H) 70 - 99 mg/dL    Comment: Glucose reference range applies only to samples taken after fasting for at least 8 hours.  Hemoglobin and hematocrit, blood     Status: Abnormal   Collection Time: 01/23/22  4:33 PM  Result Value Ref Range   Hemoglobin 12.2 12.0 - 15.0 g/dL   HCT 35.5 (L) 36.0 - 46.0 %    Comment: Performed at [facility], [address] [place], [place] [address]  Iron and TIBC     Status: None   Collection Time: 01/23/22  4:33 PM  Result Value Ref Range   Iron 69 28 - 170 ug/dL   TIBC 311 250 - 450 ug/dL   Saturation Ratios 22 10.4 - 31.8 %   UIBC 242 ug/dL    Comment: Performed at [facility], [address] [place], [place] [address]  Hemoglobin and hematocrit, blood     Status: Abnormal   Collection Time: 01/23/22 10:11 PM  Result Value Ref Range   Hemoglobin 12.1 12.0 - 15.0 g/dL   HCT 35.0 (L) 36.0 - 46.0 %    Comment: Performed at [facility], [address] [place], [place] [address]  CBC with Differential/Platelet     Status: Abnormal   Collection Time: 01/24/22 10:29 AM  Result Value Ref Range   WBC 17.4 (H) 4.0 - 10.5 K/uL   RBC 4.25 3.87 - 5.11 MIL/uL   Hemoglobin 11.7 (L) 12.0 - 15.0 g/dL   HCT 33.2 (L) 36.0 - 46.0 %   MCV 78.1 (L) 80.0 - 100.0 fL   MCH 27.5 26.0 - 34.0 pg   MCHC 35.2 30.0 - 36.0 g/dL    RDW 15.5 11.5 - 15.5 %   Platelets 206 150 - 400 K/uL   nRBC  0.0 0.0 - 0.2 %   Neutrophils Relative % 67 %   Neutro Abs 11.6 (H) 1.7 - 7.7 K/uL   Lymphocytes Relative 25 %   Lymphs Abs 4.4 (H) 0.7 - 4.0 K/uL   Monocytes Relative 7 %   Monocytes Absolute 1.3 (H) 0.1 - 1.0 K/uL   Eosinophils Relative 0 %   Eosinophils Absolute 0.0 0.0 - 0.5 K/uL   Basophils Relative 0 %   Basophils Absolute 0.0 0.0 - 0.1 K/uL   Immature Granulocytes 1 %   Abs Immature Granulocytes 0.10 (H) 0.00 - 0.07 K/uL    Comment: Performed at [facility], [address] [place], [place] [address]  Comprehensive metabolic panel     Status: Abnormal   Collection Time: 01/24/22 10:29 AM  Result Value Ref Range   Sodium 139 135 - 145 mmol/L   Potassium 3.4 (L) 3.5 - 5.1 mmol/L   Chloride 105 98 - 111 mmol/L   CO2 24 22 - 32 mmol/L   Glucose, Bld 115 (H) 70 - 99 mg/dL    Comment: Glucose reference range applies only to samples taken after fasting for at least 8 hours.   BUN 8 8 - 23 mg/dL   Creatinine, Ser 6.040.80 0.44 - 1.00 mg/dL   Calcium 9.2 8.9 - 54.010.3 mg/dL   Total Protein 7.7 6.5 - 8.1 g/dL   Albumin 3.7 3.5 - 5.0 g/dL   AST 30 15 - 41 U/L   ALT 15 0 - 44 U/L   Alkaline Phosphatase 43 38 - 126 U/L   Total Bilirubin 0.8 0.3 - 1.2 mg/dL   GFR, Estimated >98>60 >11>60 mL/min    Comment: (NOTE) Calculated using the CKD-EPI Creatinine Equation (2021)    Anion gap 10 5 - 15    Comment: Performed at [facility], [address] [place], [place] [address]  Magnesium     Status: None   Collection Time: 01/24/22 10:29 AM  Result Value Ref Range   Magnesium 1.9 1.7 - 2.4 mg/dL    Comment: Performed at [facility], [address] [place], [place] [address]  Phosphorus     Status: None   Collection Time: 01/24/22 10:29 AM  Result Value Ref Range   Phosphorus 3.3 2.5 - 4.6 mg/dL    Comment: Performed at [facility], [address] [place], [place] [address]  CBC      Status: Abnormal   Collection Time: 01/24/22  3:37 PM  Result Value Ref Range   WBC 16.7 (H) 4.0 - 10.5 K/uL   RBC 4.03 3.87 - 5.11 MIL/uL   Hemoglobin 11.0 (L) 12.0 - 15.0 g/dL   HCT 57.831.4 (L) 46.936.0 - 62.946.0 %   MCV 77.9 (L) 80.0 - 100.0 fL   MCH 27.3 26.0 - 34.0 pg   MCHC 35.0 30.0 - 36.0 g/dL   RDW 52.815.5 41.311.5 - 24.415.5 %   Platelets 194 150 - 400 K/uL   nRBC 0.0 0.0 - 0.2 %    Comment: Performed at [facility], [address] [place], [place] [address]  CBC with Differential/Platelet     Status: Abnormal   Collection Time: 01/29/22 11:45 AM  Result Value Ref Range   WBC 10.8 (H) 4.0 - 10.5 K/uL   RBC 4.37 3.87 - 5.11 Mil/uL   Hemoglobin 12.1 12.0 - 15.0 g/dL   HCT 25.335.8 (L) 66.436.0 - 40.346.0 %   MCV 82.0 78.0 - 100.0 fl   MCHC 33.9 30.0 -  36.0 g/dL   RDW 04.5 (H) 40.9 - 81.1 %   Platelets 243.0 150.0 - 400.0 K/uL   Neutrophils Relative % 52.3 43.0 - 77.0 %   Lymphocytes Relative 35.1 12.0 - 46.0 %   Monocytes Relative 8.7 3.0 - 12.0 %   Eosinophils Relative 3.4 0.0 - 5.0 %   Basophils Relative 0.5 0.0 - 3.0 %   Neutro Abs 5.6 1.4 - 7.7 K/uL   Lymphs Abs 3.8 0.7 - 4.0 K/uL   Monocytes Absolute 0.9 0.1 - 1.0 K/uL   Eosinophils Absolute 0.4 0.0 - 0.7 K/uL   Basophils Absolute 0.1 0.0 - 0.1 K/uL  IBC + Ferritin     Status: Abnormal   Collection Time: 01/29/22 11:45 AM  Result Value Ref Range   Iron 52 42 - 145 ug/dL   Transferrin 914.7 829.5 - 360.0 mg/dL   Saturation Ratios 62.1 (L) 20.0 - 50.0 %   Ferritin 39.4 10.0 - 291.0 ng/mL   TIBC 394.8 250.0 - 450.0 mcg/dL  Basic Metabolic Panel (BMET)     Status: None   Collection Time: 01/29/22 11:45 AM  Result Value Ref Range   Sodium 136 135 - 145 mEq/L   Potassium 3.8 3.5 - 5.1 mEq/L   Chloride 102 96 - 112 mEq/L   CO2 27 19 - 32 mEq/L   Glucose, Bld 92 70 - 99 mg/dL   BUN 9 6 - 23 mg/dL   Creatinine, Ser 3.08 0.40 - 1.20 mg/dL   GFR 65.78 >46.96 mL/min    Comment: Calculated using the CKD-EPI Creatinine Equation (2021)    Calcium 9.4 8.4 - 10.5 mg/dL  CBC with Differential/Platelet     Status: Abnormal   Collection Time: 02/06/22 11:10 AM  Result Value Ref Range   WBC 12.0 (H) 4.0 - 10.5 K/uL   RBC 4.43 3.87 - 5.11 MIL/uL   Hemoglobin 12.5 12.0 - 15.0 g/dL   HCT 29.5 (L) 28.4 - 13.2 %   MCV 80.6 80.0 - 100.0 fL   MCH 28.2 26.0 - 34.0 pg   MCHC 35.0 30.0 - 36.0 g/dL   RDW 44.0 10.2 - 72.5 %   Platelets 304 150 - 400 K/uL   nRBC 0.0 0.0 - 0.2 %   Neutrophils Relative % 56 %   Neutro Abs 6.6 1.7 - 7.7 K/uL   Lymphocytes Relative 35 %   Lymphs Abs 4.2 (H) 0.7 - 4.0 K/uL   Monocytes Relative 7 %   Monocytes Absolute 0.8 0.1 - 1.0 K/uL   Eosinophils Relative 2 %   Eosinophils Absolute 0.2 0.0 - 0.5 K/uL   Basophils Relative 0 %   Basophils Absolute 0.0 0.0 - 0.1 K/uL   Immature Granulocytes 0 %   Abs Immature Granulocytes 0.04 0.00 - 0.07 K/uL    Comment: Performed at [facility], [address] [place], [place] [address]  Type and screen [facility]     Status: None   Collection Time: 04/03/22  9:06 AM  Result Value Ref Range   ABO/RH(D) O POS    Antibody Screen NEG    Sample Expiration 04/06/2022,2359    Unit Number [id number]    Blood Component Type RED CELLS,LR    Unit division 00    Status of Unit ISSUED,FINAL    Transfusion Status OK TO TRANSFUSE    Crossmatch Result Compatible    Unit Number [id number]    Blood Component Type RED CELLS,LR    Unit division 00  Status of Unit ISSUED,FINAL    Transfusion Status OK TO TRANSFUSE    Crossmatch Result Compatible    Unit Number [id number]    Blood Component Type RBC LR PHER2    Unit division 00    Status of Unit ISSUED,FINAL    Transfusion Status OK TO TRANSFUSE    Crossmatch Result      Compatible Performed at [facility], [address] [place], [place] [address]   BPAM RBC     Status: None   Collection Time: 04/03/22  9:06 AM  Result Value Ref Range   ISSUE DATE / TIME  782956213086    Blood Product Unit Number [id number]    PRODUCT CODE E0382V00    Unit Type and Rh 5100    Blood Product Expiration Date 132440102725    ISSUE DATE / TIME 366440347425    Blood Product Unit Number [id number]    PRODUCT CODE E0382V00    Unit Type and Rh 5100    Blood Product Expiration Date 202310212359    ISSUE DATE / TIME 518841660630    Blood Product Unit Number [id number]    PRODUCT CODE [id number]    Unit Type and Rh 5100    Blood Product Expiration Date 062376283151   Comprehensive metabolic panel     Status: Abnormal   Collection Time: 04/03/22  9:10 AM  Result Value Ref Range   Sodium 140 135 - 145 mmol/L   Potassium 3.7 3.5 - 5.1 mmol/L   Chloride 105 98 - 111 mmol/L   CO2 27 22 - 32 mmol/L   Glucose, Bld 109 (H) 70 - 99 mg/dL    Comment: Glucose reference range applies only to samples taken after fasting for at least 8 hours.   BUN 10 8 - 23 mg/dL   Creatinine, Ser 7.61 0.44 - 1.00 mg/dL   Calcium 9.2 8.9 - 60.7 mg/dL   Total Protein 8.0 6.5 - 8.1 g/dL   Albumin 3.9 3.5 - 5.0 g/dL   AST 19 15 - 41 U/L   ALT 18 0 - 44 U/L   Alkaline Phosphatase 62 38 - 126 U/L   Total Bilirubin 0.6 0.3 - 1.2 mg/dL   GFR, Estimated >37 >10 mL/min    Comment: (NOTE) Calculated using the CKD-EPI Creatinine Equation (2021)    Anion gap 8 5 - 15    Comment: Performed at [facility], [address] [place], [place] [address]  CBC     Status: Abnormal   Collection Time: 04/03/22  9:10 AM  Result Value Ref Range   WBC 9.1 4.0 - 10.5 K/uL   RBC 4.66 3.87 - 5.11 MIL/uL   Hemoglobin 12.3 12.0 - 15.0 g/dL   HCT 85.4 (L) 62.7 - 03.5 %   MCV 76.8 (L) 80.0 - 100.0 fL   MCH 26.4 26.0 - 34.0 pg   MCHC 34.4 30.0 - 36.0 g/dL   RDW 00.9 38.1 - 82.9 %   Platelets 258 150 - 400 K/uL   nRBC 0.0 0.0 - 0.2 %    Comment: Performed at [facility], [address] [place], [place] [address]  Hemoglobin and hematocrit, blood     Status: Abnormal    Collection Time: 04/03/22  2:00 PM  Result Value Ref Range   Hemoglobin 11.0 (L) 12.0 - 15.0 g/dL   HCT 96.7 (L) 89.3 - 81.0 %    Comment: Performed at [facility], [address] [place], [place] [address]  Hemoglobin and  hematocrit, blood     Status: Abnormal   Collection Time: 04/03/22 11:04 PM  Result Value Ref Range   Hemoglobin 10.4 (L) 12.0 - 15.0 g/dL   HCT 54.0 (L) 98.1 - 19.1 %    Comment: Performed at [facility], [address] [place], [place] [address]  Hemoglobin and hematocrit, blood     Status: Abnormal   Collection Time: 04/04/22  4:40 AM  Result Value Ref Range   Hemoglobin 9.3 (L) 12.0 - 15.0 g/dL   HCT 56.2 (L) 13.0 - 86.5 %    Comment: Performed at [facility], [address], [place], [place] [address]  Prepare RBC (crossmatch)     Status: None   Collection Time: 04/04/22 11:36 AM  Result Value Ref Range   Order Confirmation      ORDER PROCESSED BY BLOOD BANK Performed at [facility], [address] [place], [place] [address]   Basic metabolic panel     Status: Abnormal   Collection Time: 04/04/22 12:11 PM  Result Value Ref Range   Sodium 141 135 - 145 mmol/L   Potassium 3.8 3.5 - 5.1 mmol/L   Chloride 109 98 - 111 mmol/L   CO2 22 22 - 32 mmol/L   Glucose, Bld 93 70 - 99 mg/dL    Comment: Glucose reference range applies only to samples taken after fasting for at least 8 hours.   BUN 10 8 - 23 mg/dL   Creatinine, Ser 8.41 0.44 - 1.00 mg/dL   Calcium 8.7 (L) 8.9 - 10.3 mg/dL   GFR, Estimated >32 >44 mL/min    Comment: (NOTE) Calculated using the CKD-EPI Creatinine Equation (2021)    Anion gap 10 5 - 15    Comment: Performed at [facility], [address] [place], [place] [address]  Hemoglobin     Status: Abnormal   Collection Time: 04/04/22  1:35 PM  Result Value Ref Range   Hemoglobin 9.2 (L) 12.0 - 15.0 g/dL    Comment: Performed at [facility], [address] [place],  [place] [address]  Hemoglobin and hematocrit, blood     Status: Abnormal   Collection Time: 04/04/22  5:54 PM  Result Value Ref Range   Hemoglobin 8.8 (L) 12.0 - 15.0 g/dL   HCT 44.0 (L) 34.7 - 42.5 %    Comment: Performed at [facility], [address], [place], [place] [address]  Ferritin     Status: None   Collection Time: 04/04/22  5:54 PM  Result Value Ref Range   Ferritin 22 11 - 307 ng/mL    Comment: Performed at [facility], [address] [place], [place] [address]  Iron and TIBC     Status: None   Collection Time: 04/04/22  5:54 PM  Result Value Ref Range   Iron 64 28 - 170 ug/dL   TIBC 329 518 - 841 ug/dL   Saturation Ratios 21 10.4 - 31.8 %   UIBC 241 ug/dL    Comment: Performed at [facility], [address] [place], [place] [address]  Vitamin B12     Status: None   Collection Time: 04/04/22  5:54 PM  Result Value Ref Range   Vitamin B-12 408 180 - 914 pg/mL    Comment: (NOTE) This assay is not validated for testing neonatal or myeloproliferative syndrome specimens for Vitamin B12 levels. Performed at [facility], [address] [place], [place] [address]   Folate  Status: None   Collection Time: 04/04/22  5:54 PM  Result Value Ref Range   Folate 26.0 >5.9 ng/mL    Comment: Performed at [facility], [address] [place], [place] [address]  Prepare RBC (crossmatch)     Status: None   Collection Time: 04/04/22  6:00 PM  Result Value Ref Range   Order Confirmation      DUPLICATE REQUEST Performed at [facility], [address] [place], [place] [address]   Glucose, capillary     Status: Abnormal   Collection Time: 04/04/22  8:15 PM  Result Value Ref Range   Glucose-Capillary 105 (H) 70 - 99 mg/dL    Comment: Glucose reference range applies only to samples taken after fasting for at least 8 hours.  MRSA Next Gen by PCR, Nasal     Status: None   Collection Time: 04/04/22  8:21 PM   Specimen: Nasal  Mucosa; Nasal Swab  Result Value Ref Range   MRSA by PCR Next Gen NOT DETECTED NOT DETECTED    Comment: (NOTE) The GeneXpert MRSA Assay (FDA approved for NASAL specimens only), is one component of a comprehensive MRSA colonization surveillance program. It is not intended to diagnose MRSA infection nor to guide or monitor treatment for MRSA infections. Test performance is not FDA approved in patients less than 73 years old. Performed at [facility], [address] [place], [place] [address]   Hemoglobin and hematocrit, blood     Status: Abnormal   Collection Time: 04/05/22 12:52 AM  Result Value Ref Range   Hemoglobin 9.2 (L) 12.0 - 15.0 g/dL   HCT 63.8 (L) 75.6 - 43.3 %    Comment: Performed at [facility], [address] [place], [place] [address]  Basic metabolic panel     Status: Abnormal   Collection Time: 04/05/22  4:37 AM  Result Value Ref Range   Sodium 141 135 - 145 mmol/L   Potassium 3.6 3.5 - 5.1 mmol/L   Chloride 110 98 - 111 mmol/L   CO2 25 22 - 32 mmol/L   Glucose, Bld 104 (H) 70 - 99 mg/dL    Comment: Glucose reference range applies only to samples taken after fasting for at least 8 hours.   BUN 11 8 - 23 mg/dL   Creatinine, Ser 8.41 0.44 - 1.00 mg/dL   Calcium 8.7 (L) 8.9 - 10.3 mg/dL   GFR, Estimated >66 >06 mL/min    Comment: (NOTE) Calculated using the CKD-EPI Creatinine Equation (2021)    Anion gap 6 5 - 15    Comment: Performed at [facility], [address], [place], [place] [address]  Magnesium     Status: None   Collection Time: 04/05/22  4:37 AM  Result Value Ref Range   Magnesium 1.9 1.7 - 2.4 mg/dL    Comment: Performed at [facility], [address] [place], [place] [address]  Hemoglobin and hematocrit, blood     Status: Abnormal   Collection Time: 04/05/22  9:03 AM  Result Value Ref Range   Hemoglobin 8.9 (L) 12.0 - 15.0 g/dL   HCT 35.5 (L) 73.2 - 20.2 %    Comment: Performed at [facility], [address] [place], [place] [address]  Hemoglobin and hematocrit, blood     Status: Abnormal   Collection Time: 04/05/22 10:37 AM  Result Value Ref Range   Hemoglobin 8.8 (L) 12.0 - 15.0 g/dL   HCT 62.3 (L) 76.2 -  46.0 %    Comment: Performed at [facility], [address] [place], [place] [address]  Prepare RBC (crossmatch)     Status: None   Collection Time: 04/05/22  1:31 PM  Result Value Ref Range   Order Confirmation      ORDER PROCESSED BY BLOOD BANK Performed at [facility], [address] [place], [place] [address]   Hemoglobin and hematocrit, blood     Status: Abnormal   Collection Time: 04/05/22  5:34 PM  Result Value Ref Range   Hemoglobin 8.0 (L) 12.0 - 15.0 g/dL   HCT 29.5 (L) 62.1 - 30.8 %    Comment: Performed at [facility], [address] [place], [place] [address]  Hematocrit     Status: Abnormal   Collection Time: 04/05/22  9:11 PM  Result Value Ref Range   HCT 20.0 (L) 36.0 - 46.0 %    Comment: Performed at [facility], [address] [place], [place] [address]  Hemoglobin     Status: Abnormal   Collection Time: 04/05/22  9:11 PM  Result Value Ref Range   Hemoglobin 6.9 (L) 12.0 - 15.0 g/dL    Comment: Performed at [facility], [address] [place], [place] [address]  Prepare RBC (crossmatch)     Status: None   Collection Time: 04/05/22 11:51 PM  Result Value Ref Range   Order Confirmation      ORDER PROCESSED BY BLOOD BANK Performed at [facility], [address] [place], [place] [address]   CBC     Status: Abnormal   Collection Time: 04/06/22  6:58 AM  Result Value Ref Range   WBC 13.7 (H) 4.0 - 10.5 K/uL   RBC 3.18 (L) 3.87 - 5.11 MIL/uL   Hemoglobin 8.8 (L) 12.0 - 15.0 g/dL   HCT 10.2 (L) 72.5 - 36.6 %   MCV 80.2 80.0 - 100.0 fL   MCH 27.7 26.0 - 34.0 pg   MCHC 34.5 30.0 - 36.0 g/dL   RDW 44.0 (H) 34.7 - 42.5 %   Platelets 147 (L) 150 - 400 K/uL   nRBC 0.0 0.0  - 0.2 %    Comment: Performed at [facility], [address] [place], [place] [address]  Basic metabolic panel     Status: Abnormal   Collection Time: 04/06/22  6:58 AM  Result Value Ref Range   Sodium 141 135 - 145 mmol/L   Potassium 3.7 3.5 - 5.1 mmol/L   Chloride 113 (H) 98 - 111 mmol/L   CO2 23 22 - 32 mmol/L   Glucose, Bld 116 (H) 70 - 99 mg/dL    Comment: Glucose reference range applies only to samples taken after fasting for at least 8 hours.   BUN 7 (L) 8 - 23 mg/dL   Creatinine, Ser 7.56 0.44 - 1.00 mg/dL   Calcium 8.1 (L) 8.9 - 10.3 mg/dL   GFR, Estimated >43 >32 mL/min    Comment: (NOTE) Calculated using the CKD-EPI Creatinine Equation (2021)    Anion gap 5 5 - 15    Comment: Performed at [facility], [address] [place], [place] [address]  Magnesium     Status: Abnormal   Collection Time: 04/06/22  6:58 AM  Result Value Ref Range   Magnesium 1.6 (L) 1.7 - 2.4 mg/dL    Comment: Performed at [facility], [address] [place], [place] [address]  Phosphorus     Status:  None   Collection Time: 04/06/22  6:58 AM  Result Value Ref Range   Phosphorus 3.6 2.5 - 4.6 mg/dL    Comment: Performed at [facility], [address] [place], [place] [address]  Hemoglobin and hematocrit, blood     Status: Abnormal   Collection Time: 04/06/22 10:56 AM  Result Value Ref Range   Hemoglobin 9.2 (L) 12.0 - 15.0 g/dL   HCT 47.8 (L) 29.5 - 62.1 %    Comment: Performed at [facility], [address] [place], [place] [address]  Hemoglobin and hematocrit, blood     Status: Abnormal   Collection Time: 04/06/22  4:22 PM  Result Value Ref Range   Hemoglobin 8.9 (L) 12.0 - 15.0 g/dL   HCT 78.4 (L) 69.6 - 29.5 %    Comment: Performed at [facility], [address] [place], [place] [address]  Hemoglobin and hematocrit, blood     Status: Abnormal   Collection Time: 04/06/22 10:17 PM  Result Value Ref Range   Hemoglobin  8.3 (L) 12.0 - 15.0 g/dL   HCT 24.4 (L) 01.0 - 27.2 %    Comment: Performed at [facility], [address] [place], [place] [address]  Basic metabolic panel     Status: Abnormal   Collection Time: 04/07/22  6:10 AM  Result Value Ref Range   Sodium 140 135 - 145 mmol/L   Potassium 3.5 3.5 - 5.1 mmol/L   Chloride 107 98 - 111 mmol/L   CO2 26 22 - 32 mmol/L   Glucose, Bld 114 (H) 70 - 99 mg/dL    Comment: Glucose reference range applies only to samples taken after fasting for at least 8 hours.   BUN <5 (L) 8 - 23 mg/dL   Creatinine, Ser 4.03 0.44 - 1.00 mg/dL   Calcium 8.2 (L) 8.9 - 10.3 mg/dL   GFR, Estimated >47 >42 mL/min    Comment: (NOTE) Calculated using the CKD-EPI Creatinine Equation (2021)    Anion gap 7 5 - 15    Comment: Performed at [facility], [address] [place], [place] [address]  CBC     Status: Abnormal   Collection Time: 04/07/22  6:10 AM  Result Value Ref Range   WBC 14.8 (H) 4.0 - 10.5 K/uL   RBC 3.08 (L) 3.87 - 5.11 MIL/uL   Hemoglobin 8.4 (L) 12.0 - 15.0 g/dL   HCT 87.5 (L) 64.3 - 32.9 %   MCV 80.8 80.0 - 100.0 fL   MCH 27.3 26.0 - 34.0 pg   MCHC 33.7 30.0 - 36.0 g/dL   RDW 51.8 (H) 84.1 - 66.0 %   Platelets 159 150 - 400 K/uL   nRBC 0.2 0.0 - 0.2 %    Comment: Performed at [facility], [address] [place], [place] [address]  CBC     Status: Abnormal   Collection Time: 04/08/22  4:49 AM  Result Value Ref Range   WBC 13.6 (H) 4.0 - 10.5 K/uL   RBC 2.91 (L) 3.87 - 5.11 MIL/uL   Hemoglobin 8.1 (L) 12.0 - 15.0 g/dL   HCT 01.0 (L) 93.2 - 35.5 %   MCV 81.8 80.0 - 100.0 fL   MCH 27.8 26.0 - 34.0 pg   MCHC 34.0 30.0 - 36.0 g/dL   RDW 73.2 (H) 20.2 - 54.2 %   Platelets 172 150 - 400 K/uL   nRBC 0.3 (H) 0.0 - 0.2 %    Comment: Performed  at [facility], [address] [place], [place] [address]  Basic metabolic panel     Status: Abnormal   Collection Time: 04/08/22  4:49 AM  Result Value Ref Range    Sodium 141 135 - 145 mmol/L   Potassium 3.4 (L) 3.5 - 5.1 mmol/L   Chloride 110 98 - 111 mmol/L   CO2 26 22 - 32 mmol/L   Glucose, Bld 95 70 - 99 mg/dL    Comment: Glucose reference range applies only to samples taken after fasting for at least 8 hours.   BUN 7 (L) 8 - 23 mg/dL   Creatinine, Ser 5.10 0.44 - 1.00 mg/dL   Calcium 8.4 (L) 8.9 - 10.3 mg/dL   GFR, Estimated >25 >85 mL/min    Comment: (NOTE) Calculated using the CKD-EPI Creatinine Equation (2021)    Anion gap 5 5 - 15    Comment: Performed at [facility] [address] [place], [place] [address]  Hemoglobin and hematocrit, blood     Status: Abnormal   Collection Time: 04/08/22  3:08 PM  Result Value Ref Range   Hemoglobin 8.4 (L) 12.0 - 15.0 g/dL   HCT 42.3 (L) 53.6 - 14.4 %    Comment: Performed at [facility], [address] [place], [place] [address]  Basic metabolic panel     Status: Abnormal   Collection Time: 04/09/22  9:51 AM  Result Value Ref Range   Sodium 138 135 - 145 mmol/L   Potassium 3.8 3.5 - 5.1 mmol/L   Chloride 108 98 - 111 mmol/L   CO2 23 22 - 32 mmol/L   Glucose, Bld 115 (H) 70 - 99 mg/dL    Comment: Glucose reference range applies only to samples taken after fasting for at least 8 hours.   BUN 6 (L) 8 - 23 mg/dL   Creatinine, Ser 0.86 0.44 - 1.00 mg/dL   Calcium 8.8 (L) 8.9 - 10.3 mg/dL   GFR, Estimated >76 >19 mL/min    Comment: (NOTE) Calculated using the CKD-EPI Creatinine Equation (2021)    Anion gap 7 5 - 15    Comment: Performed at [facility], [address] [place], [place] [address]  CBC with Differential/Platelet     Status: Abnormal   Collection Time: 04/09/22 10:35 AM  Result Value Ref Range   WBC 14.9 (H) 4.0 - 10.5 K/uL   RBC 3.00 (L) 3.87 - 5.11 MIL/uL   Hemoglobin 8.3 (L) 12.0 - 15.0 g/dL   HCT 67.1 (L) 24.5 - 80.9 %   MCV 82.3 80.0 - 100.0 fL   MCH 27.7 26.0 - 34.0 pg   MCHC 33.6 30.0 - 36.0 g/dL   RDW 98.3 (H) 38.2 - 50.5 %    Platelets 222 150 - 400 K/uL   nRBC 0.1 0.0 - 0.2 %   Neutrophils Relative % 67 %   Neutro Abs 10.2 (H) 1.7 - 7.7 K/uL   Lymphocytes Relative 21 %   Lymphs Abs 3.0 0.7 - 4.0 K/uL   Monocytes Relative 9 %   Monocytes Absolute 1.3 (H) 0.1 - 1.0 K/uL   Eosinophils Relative 2 %   Eosinophils Absolute 0.3 0.0 - 0.5 K/uL   Basophils Relative 0 %   Basophils Absolute 0.0 0.0 - 0.1 K/uL   Immature Granulocytes 1 %   Abs Immature Granulocytes 0.07 0.00 - 0.07 K/uL    Comment: Performed at [facility], [address], [place], [place] [address]  Objective  Body mass index is 37.28 kg/m. Wt Readings from Last 3 Encounters:  04/15/22 217 lb 3.2 oz (98.5 kg)  04/06/22 219 lb 9.3 oz (99.6 kg)  03/12/22 225 lb 6.4 oz (102.2 kg)   Temp Readings from Last 3 Encounters:  04/15/22 98.1 F (36.7 C) (Oral)  04/09/22 98.6 F (37 C) (Axillary)  02/27/22 (!) 97.5 F (36.4 C)   BP Readings from Last 3 Encounters:  04/15/22 118/68  04/09/22 126/66  03/12/22 125/74   Pulse Readings from Last 3 Encounters:  04/15/22 86  04/09/22 (!) 107  03/12/22 85    Physical Exam Vitals and nursing note reviewed.  Constitutional:      Appearance: Normal appearance. She is well-developed and well-groomed.  HENT:     Head: Normocephalic and atraumatic.  Eyes:     Conjunctiva/sclera: Conjunctivae normal.     Pupils: Pupils are equal, round, and reactive to light.  Cardiovascular:     Rate and Rhythm: Normal rate and regular rhythm.     Heart sounds: Normal heart sounds. No murmur heard. Pulmonary:     Effort: Pulmonary effort is normal.     Breath sounds: Normal breath sounds.  Abdominal:     General: Abdomen is flat. Bowel sounds are normal.     Tenderness: There is no abdominal tenderness.  Musculoskeletal:        General: No tenderness.  Skin:    General: Skin is warm and dry.       Neurological:     General: No focal deficit present.     Mental Status: She is alert and  oriented to person, place, and time. Mental status is at baseline.     Cranial Nerves: Cranial nerves 2-12 are intact.     Motor: Motor function is intact.     Coordination: Coordination is intact.     Gait: Gait is intact.  Psychiatric:        Attention and Perception: Attention and perception normal.        Mood and Affect: Mood and affect normal.        Speech: Speech normal.        Behavior: Behavior normal. Behavior is cooperative.        Thought Content: Thought content normal.        Cognition and Memory: Cognition and memory normal.        Judgment: Judgment normal.     Assessment  Plan  Gastrointestinal hemorrhage associated with intestinal diverticulosis - Plan: CBC with Differential/Platelet, IBC + Ferritin, Ambulatory referral to General Surgery Dr. Tommi Rumps consult if happens again will need intestinal resetion S/p 2 u pRBCs  Iron deficiency anemia due to chronic blood loss - Plan: CBC with Differential/Platelet, IBC + Ferritin  Acute blood loss anemia - Plan: CBC with Differential/Platelet, IBC + Ferritin  SOB (shortness of breath) - Plan: B Nat Peptide Leg edema - Plan: B Nat Peptide   HM Flu shot given today 04/15/22 Tdap [place] records [place]  Consider prevnar 20, shingrix further covid shots pna had 2019 [place]  -consider prevnar and pna 23 get records D.r Tawny Hopping [place] Consider shingrix in future  Pfizer covid 3/3 rec 4th   04/02/20 mammo normal ordered sch 08/20/21 negative ordered 08/2022   Colonoscopy get records [facility] obtained:  colonoscopy 10/15/06 mod to severe diverticulosis IH f/u in 5 years  EGD 11/29/07 candida, mild gastritis  EGD 01/03/13/colonoscopy diverticulosis ext and int hemorrhoids TI with ulceration neg H pylori  UGD 02/01/18 gastritis ? If had colonoscopy  GI established [place]    Pap 10/2019 normal neg neg hpv   DEXA age 79 osteopenia calcium + vit D repeat 3-5 years had 04/02/20    Provider: Dr. French Ana [name]-Internal  Medicine

## 2022-04-15 NOTE — Patient Instructions (Addendum)
Ok to stop benicar/olmesartan 10 mg for now  Too low <90 (top)/<60 (bottom)    Call and make hospital follow up with vascular Dr. Delana Meyer  MD Physician   Primary Contact Information  Phone Fax E-mail Address  [phone] Not available [place] [address]     Specialties     Vascular Surgery, Radiology, Interventional Cardiology       Dr. Zachery Dauer [facility] surgery   Phone Fax E-mail Address  [phone] Not available [place] [address]     Specialties     General Surgery, Radiology

## 2022-04-16 DIAGNOSIS — K5731 Diverticulosis of large intestine without perforation or abscess with bleeding: Secondary | ICD-10-CM | POA: Diagnosis not present

## 2022-04-16 LAB — BRAIN NATRIURETIC PEPTIDE: Brain Natriuretic Peptide: 30 pg/mL

## 2022-04-16 NOTE — Telephone Encounter (Signed)
Attempted to call pt. No answer and mail box is full. Please schedule pt for a TOC appt with Dr. Derrel Nip per her okay.

## 2022-04-17 ENCOUNTER — Ambulatory Visit: Payer: Medicare HMO | Admitting: Cardiology

## 2022-04-20 ENCOUNTER — Ambulatory Visit
Admission: RE | Admit: 2022-04-20 | Discharge: 2022-04-20 | Disposition: A | Discharge status: Home / Self Care | Payer: Medicare HMO | Source: Ambulatory Visit | Attending: Emergency Medicine | Admitting: Emergency Medicine

## 2022-04-20 VITALS — BP 111/73 | HR 95 | Temp 98.1°F | Resp 18 | Ht 64.0 in | Wt 217.2 lb

## 2022-04-20 DIAGNOSIS — R300 Dysuria: Secondary | ICD-10-CM | POA: Diagnosis not present

## 2022-04-20 LAB — POCT URINALYSIS DIP (MANUAL ENTRY)
Bilirubin, UA: NEGATIVE
Glucose, UA: NEGATIVE mg/dL
Ketones, POC UA: NEGATIVE mg/dL
Nitrite, UA: NEGATIVE
Protein Ur, POC: 100 mg/dL — AB
Spec Grav, UA: 1.01
Urobilinogen, UA: 0.2 U/dL
pH, UA: 6

## 2022-04-20 MED ORDER — CEPHALEXIN 500 MG PO CAPS: 500.0000 mg | ORAL_CAPSULE | Freq: Two times a day (BID) | ORAL | 0 refills | Status: AC

## 2022-04-20 NOTE — ED Provider Notes (Signed)
Renaldo Fiddler    CSN: [id number] Arrival date & time: 04/20/22  0857      History   Chief Complaint Chief Complaint  Patient presents with   Dysuria    HPI Cheryl Coleman is a 66 y.o. female.  Patient presents with dysuria and urinary frequency x2 days.  No fever, abdominal pain, nausea, vomiting, diarrhea, flank pain, pelvic pain, or other symptoms.  Treatment at home with Tylenol and cranberry juice.  Her medical history includes hypertension, asthma, GERD, GI bleed, diverticulitis.  The history is provided by the patient and medical records.    Past Medical History:  Diagnosis Date   Arthritis    Asthma    COVID    COVID-19    07/24/20, 05/19/21   Diverticulitis    GERD (gastroesophageal reflux disease)    GIB (gastrointestinal bleeding)    diverticular 01/23/22-01/24/22 [facility] s/p emobolization & 15 years ago in [place]   Hiatal hernia    History of blood transfusion    History of chicken pox    History of GI bleed    2013/14   Hyperlipidemia    Hypertension    Leukocytosis    Type O blood, Rh positive     Patient Active Problem List   Diagnosis Date Noted   Hematochezia    Hypovolemic shock (HCC) 04/06/2022   Hypomagnesemia    Diverticulosis of colon with hemorrhage    Colonic hemorrhage 04/04/2022   Acute blood loss anemia    Sigmoid diverticulosis    External hemorrhoids    Gastrointestinal hemorrhage 01/29/2022   History of GI diverticular bleed 01/23/2022   BMI 38.0-38.9,adult 01/14/2022   Gallstones 10/24/2020   Dysphagia 07/23/2020   Acute non-recurrent sinusitis 05/31/2020   Osteopenia 05/09/2020   Onychomycosis 11/03/2019   Asthma 08/29/2019   Hiatal hernia 06/06/2019   Anxiety 11/25/2018   Palpitations 10/19/2018   Vitamin D deficiency 10/06/2018   Prediabetes 10/06/2018   Essential hypertension 08/18/2018   Gastroesophageal reflux disease 08/18/2018   Hyperlipidemia 08/18/2018   Mild intermittent asthma 08/18/2018   Allergic rhinitis due  to pollen 05/11/2014    Past Surgical History:  Procedure Laterality Date   BREAST CYST ASPIRATION Right    CESAREAN SECTION     04/27/84   COLONOSCOPY     COLONOSCOPY WITH PROPOFOL N/A 02/27/2022   Procedure: COLONOSCOPY WITH PROPOFOL;  Surgeon: [name], MD;  Location: [facility] ENDOSCOPY;  Service: Gastroenterology;  Laterality: N/A;   EMBOLIZATION N/A 01/23/2022   Procedure: EMBOLIZATION;  Surgeon: [name], MD;  Location: [facility] INVASIVE CV LAB;  Service: Cardiovascular;  Laterality: N/A;   EMBOLIZATION N/A 04/06/2022   Procedure: EMBOLIZATION;  Surgeon: Annice Needy, MD;  Location: [facility] INVASIVE CV LAB;  Service: Cardiovascular;  Laterality: N/A;   ESOPHAGOGASTRODUODENOSCOPY (EGD) WITH PROPOFOL N/A 08/29/2020   Procedure: ESOPHAGOGASTRODUODENOSCOPY (EGD) WITH PROPOFOL;  Surgeon: [name], MD;  Location: [facility] ENDOSCOPY;  Service: Gastroenterology;  Laterality: N/A;   IR ANGIOGRAM VISCERAL SELECTIVE  04/05/2022   IR ANGIOGRAM VISCERAL SELECTIVE  04/05/2022   IR EMBO ART  VEN HEMORR LYMPH EXTRAV  INC GUIDE ROADMAPPING  04/05/2022   IR US GUIDE VASC ACCESS RIGHT  04/05/2022   ORIF ACETABULAR FRACTURE      OB History   No obstetric history on file.      Home Medications    Prior to Admission medications   Medication Sig Start Date End Date Taking? Authorizing Provider  cephALEXin (KEFLEX) 500 MG capsule Take  1 capsule (500 mg total) by mouth 2 (two) times daily for 5 days. 04/20/22 04/25/22 Yes Mickie Bail, NP  albuterol (VENTOLIN HFA) 108 (90 Base) MCG/ACT inhaler INHALE 1-2 PUFFS BY MOUTH EVERY 6 (SIX) HOURS AS NEEDED 01/14/22   [name], [name], MD  atorvastatin (LIPITOR) 10 MG tablet TAKE 1 TABLET (10 MG TOTAL) BY MOUTH DAILY AT 6 PM. 09/04/21   [name], [name], MD  citalopram (CELEXA) 10 MG tablet Take 1 tablet (10 mg total) by mouth daily. 10/14/21   [name], [name], MD  clotrimazole-betamethasone (LOTRISONE) cream Apply 1  Application topically 2 (two) times daily. 03/25/22   McDonald, Rachelle Hora, DPM  dicyclomine (BENTYL) 20 MG tablet Take 1 tablet (20 mg total) by mouth 3 (three) times daily as needed for spasms. Patient not taking: Reported on 04/15/2022 04/09/22   Tresa Moore, MD  diltiazem (CARDIZEM CD) 120 MG 24 hr capsule Take 1 capsule (120 mg total) by mouth daily. qhs 01/14/22   [name], [name], MD  fluticasone (FLONASE) 50 MCG/ACT nasal spray SPRAY 2 SPRAYS INTO EACH NOSTRIL EVERY DAY Patient taking differently: Place 2 sprays into both nostrils daily as needed for allergies or rhinitis. 09/09/21   [name], [name], MD  montelukast (SINGULAIR) 10 MG tablet TAKE 1 TABLET BY MOUTH EVERYDAY AT BEDTIME Patient taking differently: Take 10 mg by mouth at bedtime. 09/15/21   [name], [name], MD  omeprazole (PRILOSEC) 40 MG capsule TAKE 1 CAPSULE BY MOUTH TWICE A DAY 02/08/22   [name], FNP  ondansetron (ZOFRAN) 4 MG tablet Take 1 tablet (4 mg total) by mouth daily as needed for nausea or vomiting. Patient not taking: Reported on 04/15/2022 04/09/22 04/09/23  Tresa Moore, MD  simethicone (GAS-X) 80 MG chewable tablet Chew 1 tablet (80 mg total) by mouth 4 (four) times daily as needed for flatulence. Patient not taking: Reported on 04/15/2022 04/09/22 04/09/23  Tresa Moore, MD  Specialty Vitamins Products (WOMENS VITA PAK PO) Take by mouth daily.    [provider]  terbinafine (LAMISIL) 250 MG tablet Take 1 tablet (250 mg total) by mouth daily. 03/25/22 06/23/22  Edwin Cap, DPM    Family History Family History  Problem Relation Age of Onset   Hypertension Maternal Aunt    Diabetes Maternal Aunt    Breast cancer Neg Hx     Social History Social History   Tobacco Use   Smoking status: Never   Smokeless tobacco: Never  Vaping Use   Vaping Use: Never used  Substance Use Topics   Alcohol use: Not Currently   Drug use: Not Currently     Allergies    Oxycodone and Dilaudid [hydromorphone]   Review of Systems Review of Systems  Constitutional:  Positive for chills. Negative for fever.  Gastrointestinal:  Negative for abdominal pain, diarrhea, nausea and vomiting.  Genitourinary:  Positive for dysuria and frequency. Negative for flank pain, hematuria and pelvic pain.  All other systems reviewed and are negative.    Physical Exam Triage Vital Signs ED Triage Vitals  Enc Vitals Group     BP      Pulse      Resp      Temp      Temp src      SpO2      Weight      Height      Head Circumference      Peak Flow  Pain Score      Pain Loc      Pain Edu?      Excl. in [place]?    No data found.  Updated Vital Signs BP 111/73   Pulse 95   Temp 98.1 F (36.7 C)   Resp 18   Ht 5\' 4"  (1.626 m)   Wt 217 lb 3.2 oz (98.5 kg)   SpO2 98%   BMI 37.28 kg/m   Visual Acuity Right Eye Distance:   Left Eye Distance:   Bilateral Distance:    Right Eye Near:   Left Eye Near:    Bilateral Near:     Physical Exam Vitals and nursing note reviewed.  Constitutional:      General: She is not in acute distress.    Appearance: She is well-developed. She is not ill-appearing.  HENT:     Mouth/Throat:     Mouth: Mucous membranes are moist.  Cardiovascular:     Rate and Rhythm: Normal rate and regular rhythm.     Heart sounds: Normal heart sounds.  Pulmonary:     Effort: Pulmonary effort is normal. No respiratory distress.     Breath sounds: Normal breath sounds.  Abdominal:     General: Bowel sounds are normal.     Palpations: Abdomen is soft.     Tenderness: There is no abdominal tenderness. There is no right CVA tenderness, left CVA tenderness, guarding or rebound.  Musculoskeletal:     Cervical back: Neck supple.  Skin:    General: Skin is warm and dry.  Neurological:     Mental Status: She is alert.  Psychiatric:        Mood and Affect: Mood normal.        Behavior: Behavior normal.      UC Treatments / Results   Labs (all labs ordered are listed, but only abnormal results are displayed) Labs Reviewed  POCT URINALYSIS DIP (MANUAL ENTRY) - Abnormal; Notable for the following components:      Result Value   Clarity, UA cloudy (*)    Blood, UA large (*)    Protein Ur, POC =100 (*)    Leukocytes, UA Large (3+) (*)    All other components within normal limits  URINE CULTURE    EKG   Radiology No results found.  Procedures Procedures (including critical care time)  Medications Ordered in UC Medications - No data to display  Initial Impression / Assessment and Plan / UC Course  I have reviewed the triage vital signs and the nursing notes.  Pertinent labs & imaging results that were available during my care of the patient were reviewed by me and considered in my medical decision making (see chart for details).   Dysuria.  Treating with Keflex. Urine culture pending. Discussed with patient that we will call her if the urine culture shows the need to change or discontinue the antibiotic. Instructed her to follow-up with her PCP if her symptoms are not improving. Patient agrees to plan of care.      Final Clinical Impressions(s) / UC Diagnoses   Final diagnoses:  Dysuria     Discharge Instructions      Take the antibiotic as directed.  The urine culture is pending.  We will call you if it shows the need to change or discontinue your antibiotic.    Follow up with your primary care provider if your symptoms are not improving.        ED Prescriptions  Medication Sig Dispense Auth. Provider   cephALEXin (KEFLEX) 500 MG capsule Take 1 capsule (500 mg total) by mouth 2 (two) times daily for 5 days. 10 capsule Mickie Bail, NP      PDMP not reviewed this encounter.   Mickie Bail, NP 04/20/22 [phone]

## 2022-04-20 NOTE — ED Triage Notes (Signed)
Patient to Urgent Care with complaints of dysuria x2 days. Reports urinary frequency yesterday. Has been drinking cranberry juice and fluids but symptoms worsened last night.

## 2022-04-20 NOTE — Discharge Instructions (Addendum)
Take the antibiotic as directed.  The urine culture is pending.  We will call you if it shows the need to change or discontinue your antibiotic.    Follow up with your primary care provider if your symptoms are not improving.    

## 2022-04-21 NOTE — Telephone Encounter (Signed)
Spoke with pt to let her know that you would take her as anew pt. Went to schedule pt and she stated that she just saw Dr. Olivia Mackie last week for a hospital follow up from a GI bleed. Pt stated that when she left the hospital her Hemoglobin was 8.3 and then last week when Dr. Olivia Mackie checked it it had came up to 9.6. Pt wasn't sure when she should schedule an appt with you, wasn't sure if you wanted to follow up on the hemoglobin.

## 2022-04-22 ENCOUNTER — Ambulatory Visit: Payer: Medicare HMO | Admitting: Internal Medicine

## 2022-04-22 LAB — URINE CULTURE: Culture: >=100000 — AB

## 2022-04-22 NOTE — Telephone Encounter (Signed)
Attempted to call pt. Mail box is full. Please schedule pt for a one month follow up when she returns call.

## 2022-05-07 ENCOUNTER — Other Ambulatory Visit: Payer: Self-pay | Admitting: Family

## 2022-05-07 DIAGNOSIS — K219 Gastro-esophageal reflux disease without esophagitis: Secondary | ICD-10-CM

## 2022-05-11 ENCOUNTER — Encounter: Payer: Self-pay | Admitting: Cardiology

## 2022-05-11 ENCOUNTER — Ambulatory Visit: Payer: Medicare HMO | Attending: Cardiology | Admitting: Cardiology

## 2022-05-11 VITALS — BP 108/66 | HR 80 | Ht 64.0 in | Wt 218.0 lb

## 2022-05-11 DIAGNOSIS — E7800 Pure hypercholesterolemia, unspecified: Secondary | ICD-10-CM

## 2022-05-11 DIAGNOSIS — I471 Supraventricular tachycardia, unspecified: Secondary | ICD-10-CM | POA: Diagnosis not present

## 2022-05-11 DIAGNOSIS — I10 Essential (primary) hypertension: Secondary | ICD-10-CM | POA: Diagnosis not present

## 2022-05-11 MED ORDER — DILTIAZEM HCL ER COATED BEADS 120 MG PO CP24: 120.0000 mg | ORAL_CAPSULE | Freq: Two times a day (BID) | ORAL | 3 refills | Status: DC

## 2022-05-11 NOTE — Progress Notes (Signed)
Cardiology Office Note:    Date:  05/11/2022   ID:  Cheryl Coleman, DOB 1955-12-15, MRN [id number]  PCP:  [name], [name], MD  [facility] Cardiologist:  Debbe Odea, MD  [facility] Electrophysiologist:  None   Referring MD: [name], French Ana *   Chief Complaint  Patient presents with   Follow-up    Hospital follow up, no new cardiac concerns      History of Present Illness:    Cheryl Coleman is a 66 y.o. female with a hx of hypertension, hyperlipidemia, paroxysmal SVT, anxiety who presents for hospital follow-up.    Recently seen in the ED for blood in stool, hypotension.  Benicar was stopped, Cardizem dose reduced.  Previously seen from a cardiac perspective due to paroxysmal SVT, managed with Cardizem.  Currently takes Cardizem CD1 20 mg twice daily.  Blood pressures have been adequately controlled, heart rate also controlled.  Denies any further bleeding issues.  Denies palpitations, chest pain or shortness of breath.  Prior notes Echocardiogram 08/2020 EF 60 to 65%, diastolic function normal. Cardiac monitor on 09/02/2020 paroxysmal SVT,   Past Medical History:  Diagnosis Date   Arthritis    Asthma    COVID    COVID-19    07/24/20, 05/19/21   Diverticulitis    GERD (gastroesophageal reflux disease)    GIB (gastrointestinal bleeding)    diverticular 01/23/22-01/24/22 [facility] s/p emobolization & 15 years ago in [place]   Hiatal hernia    History of blood transfusion    History of chicken pox    History of GI bleed    2013/14   Hyperlipidemia    Hypertension    Leukocytosis    Type O blood, Rh positive     Past Surgical History:  Procedure Laterality Date   BREAST CYST ASPIRATION Right    CESAREAN SECTION     04/27/84   COLONOSCOPY     COLONOSCOPY WITH PROPOFOL N/A 02/27/2022   Procedure: COLONOSCOPY WITH PROPOFOL;  Surgeon: [name], MD;  Location: [facility] ENDOSCOPY;  Service: Gastroenterology;  Laterality: N/A;   EMBOLIZATION N/A 01/23/2022    Procedure: EMBOLIZATION;  Surgeon: [name], MD;  Location: [facility] INVASIVE CV LAB;  Service: Cardiovascular;  Laterality: N/A;   EMBOLIZATION N/A 04/06/2022   Procedure: EMBOLIZATION;  Surgeon: Annice Needy, MD;  Location: [facility] INVASIVE CV LAB;  Service: Cardiovascular;  Laterality: N/A;   ESOPHAGOGASTRODUODENOSCOPY (EGD) WITH PROPOFOL N/A 08/29/2020   Procedure: ESOPHAGOGASTRODUODENOSCOPY (EGD) WITH PROPOFOL;  Surgeon: [name], MD;  Location: [facility] ENDOSCOPY;  Service: Gastroenterology;  Laterality: N/A;   IR ANGIOGRAM VISCERAL SELECTIVE  04/05/2022   IR ANGIOGRAM VISCERAL SELECTIVE  04/05/2022   IR EMBO ART  VEN HEMORR LYMPH EXTRAV  INC GUIDE ROADMAPPING  04/05/2022   IR US GUIDE VASC ACCESS RIGHT  04/05/2022   ORIF ACETABULAR FRACTURE      Current Medications: Current Meds  Medication Sig   albuterol (VENTOLIN HFA) 108 (90 Base) MCG/ACT inhaler INHALE 1-2 PUFFS BY MOUTH EVERY 6 (SIX) HOURS AS NEEDED   atorvastatin (LIPITOR) 10 MG tablet TAKE 1 TABLET (10 MG TOTAL) BY MOUTH DAILY AT 6 PM.   citalopram (CELEXA) 10 MG tablet Take 1 tablet (10 mg total) by mouth daily.   fluticasone (FLONASE) 50 MCG/ACT nasal spray SPRAY 2 SPRAYS INTO EACH NOSTRIL EVERY DAY (Patient taking differently: Place 2 sprays into both nostrils daily as needed for allergies or rhinitis.)   montelukast (SINGULAIR) 10 MG tablet TAKE 1 TABLET BY MOUTH  EVERYDAY AT BEDTIME (Patient taking differently: Take 10 mg by mouth at bedtime.)   omeprazole (PRILOSEC) 40 MG capsule TAKE 1 CAPSULE BY MOUTH TWICE A DAY   Specialty Vitamins Products (WOMENS VITA PAK PO) Take by mouth daily.   terbinafine (LAMISIL) 250 MG tablet Take 1 tablet (250 mg total) by mouth daily.   [DISCONTINUED] diltiazem (CARDIZEM CD) 120 MG 24 hr capsule Take 1 capsule (120 mg total) by mouth daily. qhs     Allergies:   Oxycodone and Dilaudid [hydromorphone]   Social History   Socioeconomic History   Marital status: Married    Spouse  name: Not on file   Number of children: Not on file   Years of education: Not on file   Highest education level: Not on file  Occupational History   Not on file  Tobacco Use   Smoking status: Never   Smokeless tobacco: Never  Vaping Use   Vaping Use: Never used  Substance and Sexual Activity   Alcohol use: Not Currently   Drug use: Not Currently   Sexual activity: Yes    Comment: husband   Other Topics Concern   Not on file  Social History Narrative   Lived in [place] from [place]    Married 1st husband died in late 90s/2000s   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, wears seat belt, safe in relationship    Social Determinants of Health   Financial Resource Strain: Not on file  Food Insecurity: No Food Insecurity (04/06/2022)   Hunger Vital Sign    Worried About Running Out of Food in the Last Year: Never true    Ran Out of Food in the Last Year: Never true  Transportation Needs: No Transportation Needs (04/06/2022)   PRAPARE - Administrator, Civil Service (Medical): No    Lack of Transportation (Non-Medical): No  Physical Activity: Not on file  Stress: Not on file  Social Connections: Not on file     Family History: The patient's family history includes Diabetes in her maternal aunt; Hypertension in her maternal aunt. There is no history of Breast cancer.  ROS:   Please see the history of present illness.     All other systems reviewed and are negative.  EKGs/Labs/Other Studies Reviewed:    The following studies were reviewed today:   EKG:  EKG is ordered today.  EKG shows normal sinus rhythm, left atrial enlargement.  Recent Labs: 04/03/2022: ALT 18 04/06/2022: Magnesium 1.6 04/09/2022: BUN 6; Creatinine, Ser 0.83; Potassium 3.8; Sodium 138 04/15/2022: Brain Natriuretic Peptide 30; Hemoglobin 9.6; Platelets 385.0  Recent Lipid Panel    Component Value Date/Time   CHOL 154 10/10/2021 0821   TRIG 77.0 10/10/2021 0821   HDL 54.70 10/10/2021 0821   CHOLHDL 3  10/10/2021 0821   VLDL 15.4 10/10/2021 0821   LDLCALC 84 10/10/2021 0821     Risk Assessment/Calculations:      Physical Exam:    VS:  BP 108/66 (BP Location: Left Arm, Patient Position: Sitting, Cuff Size: Normal)   Pulse 80   Ht 5\' 4"  (1.626 m)   Wt 218 lb (98.9 kg)   SpO2 98%   BMI 37.42 kg/m     Wt Readings from Last 3 Encounters:  05/11/22 218 lb (98.9 kg)  04/20/22 217 lb 3.2 oz (98.5 kg)  04/15/22 217 lb 3.2 oz (98.5 kg)     GEN:  Well nourished, well developed in no  acute distress HEENT: Normal NECK: No JVD; No carotid bruits CARDIAC: RRR, no murmurs, rubs, gallops RESPIRATORY:  Clear to auscultation without rales, wheezing or rhonchi  ABDOMEN: Soft, non-tender, non-distended MUSCULOSKELETAL:  No edema; No deformity  SKIN: Warm and dry NEUROLOGIC:  Alert and oriented x 3 PSYCHIATRIC:  Normal affect   ASSESSMENT:    1. Paroxysmal SVT (supraventricular tachycardia)   2. Pure hypercholesterolemia   3. Primary hypertension   4. Hypertension, unspecified type    PLAN:    In order of problems listed above:  Paroxysmal SVT, controlled on Cardizem.  Continue Cardizem 120 mg twice daily. Hyperlipidemia, cholesterol controlled, continue Lipitor. Hypertension, BP controlled.  Continue Cardizem 120 mg twice daily  Follow-up yearly.   Medication Adjustments/Labs and Tests Ordered: Current medicines are reviewed at length with the patient today.  Concerns regarding medicines are outlined above.  Orders Placed This Encounter  Procedures   EKG 12-Lead   Meds ordered this encounter  Medications   diltiazem (CARDIZEM CD) 120 MG 24 hr capsule    Sig: Take 1 capsule (120 mg total) by mouth 2 (two) times daily. qhs    Dispense:  180 capsule    Refill:  3    Patient Instructions  Medication Instructions:   Your physician recommends that you continue on your current medications as directed. Please refer to the Current Medication list given to you  today.   *If you need a refill on your cardiac medications before your next appointment, please call your pharmacy*   Follow-Up: At [facility], you and your health needs are our priority.  As part of our continuing mission to provide you with exceptional heart care, we have created designated Provider Care Teams.  These Care Teams include your primary Cardiologist (physician) and Advanced Practice Providers (APPs -  Physician Assistants and Nurse Practitioners) who all work together to provide you with the care you need, when you need it.  We recommend signing up for the patient portal called "MyChart".  Sign up information is provided on this After Visit Summary.  MyChart is used to connect with patients for Virtual Visits (Telemedicine).  Patients are able to view lab/test results, encounter notes, upcoming appointments, etc.  Non-urgent messages can be sent to your provider as well.   To learn more about what you can do with MyChart, go to [email or web address].    Your next appointment:   1 year(s)  The format for your next appointment:   In Person  Provider:   Kate Sable, MD    Other Instructions   Important Information About Sugar         Signed, Kate Sable, MD  05/11/2022 12:53 PM    [place]

## 2022-05-11 NOTE — Patient Instructions (Signed)
Medication Instructions:   Your physician recommends that you continue on your current medications as directed. Please refer to the Current Medication list given to you today.  *If you need a refill on your cardiac medications before your next appointment, please call your pharmacy*   Follow-Up: At [facility], you and your health needs are our priority.  As part of our continuing mission to provide you with exceptional heart care, we have created designated Provider Care Teams.  These Care Teams include your primary Cardiologist (physician) and Advanced Practice Providers (APPs -  Physician Assistants and Nurse Practitioners) who all work together to provide you with the care you need, when you need it.  We recommend signing up for the patient portal called "MyChart".  Sign up information is provided on this After Visit Summary.  MyChart is used to connect with patients for Virtual Visits (Telemedicine).  Patients are able to view lab/test results, encounter notes, upcoming appointments, etc.  Non-urgent messages can be sent to your provider as well.   To learn more about what you can do with MyChart, go to [email or web address].    Your next appointment:   1 year(s)  The format for your next appointment:   In Person  Provider:   Brian Agbor-Etang, MD    Other Instructions   Important Information About Sugar       

## 2022-05-12 ENCOUNTER — Other Ambulatory Visit: Payer: Self-pay | Admitting: *Deleted

## 2022-05-12 DIAGNOSIS — I10 Essential (primary) hypertension: Secondary | ICD-10-CM

## 2022-05-12 MED ORDER — DILTIAZEM HCL ER COATED BEADS 120 MG PO CP24: 120.0000 mg | ORAL_CAPSULE | Freq: Two times a day (BID) | ORAL | 3 refills | Status: DC

## 2022-05-18 ENCOUNTER — Encounter (INDEPENDENT_AMBULATORY_CARE_PROVIDER_SITE_OTHER): Payer: Self-pay

## 2022-05-22 ENCOUNTER — Ambulatory Visit: Payer: Medicare HMO | Admitting: Cardiology

## 2022-06-04 ENCOUNTER — Telehealth: Payer: Self-pay | Admitting: *Deleted

## 2022-06-04 NOTE — Telephone Encounter (Signed)
Patient called stating that she has an appointment with Dr Smith Robert on 11/28 and her PCP on 11/29 and will need labs drawn at both appts. She is asking if we can combine labs so that she only gets labs drawn once. Please return her call

## 2022-06-16 ENCOUNTER — Inpatient Hospital Stay: Payer: Medicare HMO | Attending: Oncology

## 2022-06-16 DIAGNOSIS — Z79899 Other long term (current) drug therapy: Secondary | ICD-10-CM | POA: Diagnosis not present

## 2022-06-16 DIAGNOSIS — D72820 Lymphocytosis (symptomatic): Secondary | ICD-10-CM | POA: Insufficient documentation

## 2022-06-16 DIAGNOSIS — E611 Iron deficiency: Secondary | ICD-10-CM | POA: Diagnosis not present

## 2022-06-16 LAB — CBC WITH DIFFERENTIAL/PLATELET
Abs Immature Granulocytes: 0.02 K/uL (ref 0.00–0.07)
Basophils Absolute: 0.1 K/uL (ref 0.0–0.1)
Basophils Relative: 1 %
Eosinophils Absolute: 0.2 K/uL (ref 0.0–0.5)
Eosinophils Relative: 2 %
HCT: 35.8 % — ABNORMAL LOW (ref 36.0–46.0)
Hemoglobin: 12.1 g/dL (ref 12.0–15.0)
Immature Granulocytes: 0 %
Lymphocytes Relative: 31 %
Lymphs Abs: 2.7 K/uL (ref 0.7–4.0)
MCH: 25.2 pg — ABNORMAL LOW (ref 26.0–34.0)
MCHC: 33.8 g/dL (ref 30.0–36.0)
MCV: 74.4 fL — ABNORMAL LOW (ref 80.0–100.0)
Monocytes Absolute: 0.6 K/uL (ref 0.1–1.0)
Monocytes Relative: 7 %
Neutro Abs: 5.4 K/uL (ref 1.7–7.7)
Neutrophils Relative %: 59 %
Platelets: 310 K/uL (ref 150–400)
RBC: 4.81 MIL/uL (ref 3.87–5.11)
RDW: 16.3 % — ABNORMAL HIGH (ref 11.5–15.5)
WBC: 9 K/uL (ref 4.0–10.5)
nRBC: 0 % (ref 0.0–0.2)

## 2022-06-16 LAB — IRON AND TIBC
Iron: 44 ug/dL (ref 28–170)
Saturation Ratios: 10 % — ABNORMAL LOW (ref 10.4–31.8)
TIBC: 421 ug/dL (ref 250–450)
UIBC: 377 ug/dL

## 2022-06-16 LAB — FERRITIN: Ferritin: 9 ng/mL — ABNORMAL LOW (ref 11–307)

## 2022-06-17 ENCOUNTER — Encounter: Payer: Self-pay | Admitting: Internal Medicine

## 2022-06-17 ENCOUNTER — Ambulatory Visit (INDEPENDENT_AMBULATORY_CARE_PROVIDER_SITE_OTHER): Payer: Medicare HMO | Admitting: Internal Medicine

## 2022-06-17 VITALS — BP 126/74 | HR 84 | Temp 97.9°F | Ht 64.0 in | Wt 218.2 lb

## 2022-06-17 DIAGNOSIS — K921 Melena: Secondary | ICD-10-CM

## 2022-06-17 DIAGNOSIS — K5791 Diverticulosis of intestine, part unspecified, without perforation or abscess with bleeding: Secondary | ICD-10-CM

## 2022-06-17 DIAGNOSIS — Z1231 Encounter for screening mammogram for malignant neoplasm of breast: Secondary | ICD-10-CM

## 2022-06-17 DIAGNOSIS — E785 Hyperlipidemia, unspecified: Secondary | ICD-10-CM | POA: Diagnosis not present

## 2022-06-17 DIAGNOSIS — D62 Acute posthemorrhagic anemia: Secondary | ICD-10-CM | POA: Diagnosis not present

## 2022-06-17 NOTE — Patient Instructions (Addendum)
Resume oral iron .  Take one tablet with food every other day  Recheck iron in 6 weeks  Start a walking program:  goal 30 minutes 5 days per week    You can take  up to 2000 mg of acetominophen (tylenol) every day safely  In divided doses (500 mg every 6 hours  Or 1000 mg every 12 hours.)   Please request your vaccination history from your former provider

## 2022-06-17 NOTE — Progress Notes (Signed)
Subjective:  Patient ID: Cheryl Coleman, female    DOB: March 17, 1956  Age: 66 y.o. MRN: [id number]  CC: There were no encounter diagnoses.   HPI Cheryl Coleman presents for [place].  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  Has had an  Eventful summer:  1) history of diverticular bleeds x 2 this summer.  Sept episode required ICU admission for shock/receieved  transfusion.  S/p embolization by vascular.   .  3rd episode has not occurred but would necessitate hemicolectomy.  Has changed diet to include daily metamucil  for the past month which has improved her elimination . 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   Moved here from [place] in 2019 to help husband's aging family.  Her family is in [place] and [place]   Diet:  protein shakes.  Healthy diet , not exercising yet but planning to   IDA: anemia resolved.  Has avoided oral due to constipation .   Anxiety:  aggravated by episodes of forgetfulness after the GI bleeds    Outpatient Medications Prior to Visit  Medication Sig Dispense Refill   albuterol (VENTOLIN HFA) 108 (90 Base) MCG/ACT inhaler INHALE 1-2 PUFFS BY MOUTH EVERY 6 (SIX) HOURS AS NEEDED 18 each 11   atorvastatin (LIPITOR) 10 MG tablet TAKE 1 TABLET (10 MG TOTAL) BY MOUTH DAILY AT 6 PM. 90 tablet 3   citalopram (CELEXA) 10 MG tablet Take 1 tablet (10 mg total) by mouth daily. 90 tablet 3   diltiazem (CARDIZEM CD) 120 MG 24 hr capsule Take 1 capsule (120 mg total) by mouth 2 (two) times daily. 180 capsule 3   fluticasone (FLONASE) 50 MCG/ACT nasal spray SPRAY 2 SPRAYS INTO EACH NOSTRIL EVERY DAY (Patient taking differently: Place 2 sprays into both nostrils daily as needed for allergies or rhinitis.) 48 mL 3   montelukast (SINGULAIR) 10 MG tablet TAKE 1  TABLET BY MOUTH EVERYDAY AT BEDTIME (Patient taking differently: Take 10 mg by mouth at bedtime.) 90 tablet 3   omeprazole (PRILOSEC) 40 MG capsule TAKE 1 CAPSULE BY MOUTH TWICE A DAY 180 capsule 0   Specialty Vitamins Products (WOMENS VITA PAK PO) Take by mouth daily.     terbinafine (LAMISIL) 250 MG tablet Take 1 tablet (250 mg total) by mouth daily. 90 tablet 0   clotrimazole-betamethasone (LOTRISONE) cream Apply 1 Application topically 2 (two) times daily. (Patient not taking: Reported on 05/11/2022) 30 g 2   dicyclomine (BENTYL) 20 MG tablet Take 1 tablet (20 mg total) by mouth 3 (three) times daily as needed for spasms. (Patient not taking: Reported on 04/15/2022) 30 tablet 0   ondansetron (ZOFRAN) 4 MG tablet Take 1 tablet (4 mg total) by mouth daily as needed for nausea or vomiting. (Patient not taking: Reported on 04/15/2022) 30 tablet 0   simethicone (GAS-X) 80 MG chewable tablet Chew 1 tablet (80 mg total) by mouth 4 (four) times daily as needed for flatulence. (Patient not taking: Reported on 04/15/2022) 100 tablet 0   No facility-administered medications prior to visit.    Review of Systems;  Patient denies headache, fevers, malaise, unintentional weight loss, skin rash, eye pain, sinus congestion and sinus pain, sore throat,  dysphagia,  hemoptysis , cough, dyspnea, wheezing, chest pain, palpitations, orthopnea, edema, abdominal pain, nausea, melena, diarrhea, constipation, flank pain, dysuria, hematuria, urinary  Frequency, nocturia, numbness, tingling, seizures,  Focal weakness, Loss of consciousness,  Tremor, insomnia, depression, anxiety, and suicidal ideation.      Objective:  BP 126/74   Pulse 84   Temp 97.9 F (36.6 C) (Oral)   Ht 5\' 4"  (1.626 m)   Wt 218 lb 3.2 oz (99 kg)   SpO2 98%   BMI 37.45 kg/m   BP Readings from Last 3 Encounters:  06/17/22 126/74  05/11/22 108/66  04/20/22 111/73    Wt Readings from Last 3 Encounters:  06/17/22 218 lb 3.2 oz (99 kg)   05/11/22 218 lb (98.9 kg)  04/20/22 217 lb 3.2 oz (98.5 kg)    General appearance: alert, cooperative and appears stated age Ears: normal TM's and external ear canals both ears Throat: lips, mucosa, and tongue normal; teeth and gums normal Neck: no adenopathy, no carotid bruit, supple, symmetrical, trachea midline and thyroid not enlarged, symmetric, no tenderness/mass/nodules Back: symmetric, no curvature. ROM normal. No CVA tenderness. Lungs: clear to auscultation bilaterally Heart: regular rate and rhythm, S1, S2 normal, no murmur, click, rub or gallop Abdomen: soft, non-tender; bowel sounds normal; no masses,  no organomegaly Pulses: 2+ and symmetric Skin: Skin color, texture, turgor normal. No rashes or lesions Lymph nodes: Cervical, supraclavicular, and axillary nodes normal. Neuro:  awake and interactive with normal mood and affect. Higher cortical functions are normal. Speech is clear without word-finding difficulty or dysarthria. Extraocular movements are intact. Visual fields of both eyes are grossly intact. Sensation to light touch is grossly intact bilaterally of upper and lower extremities. Motor examination shows 4+/5 symmetric hand grip and upper extremity and 5/5 lower extremity strength. There is no pronation or drift. Gait is non-ataxic   Lab Results  Component Value Date   HGBA1C 6.4 10/10/2021   HGBA1C 6.0 07/08/2021   HGBA1C 5.9 12/13/2020    Lab Results  Component Value Date   CREATININE 0.83 04/09/2022   CREATININE 0.78 04/08/2022   CREATININE 0.74 04/07/2022    Lab Results  Component Value Date   WBC 9.0 06/16/2022   HGB 12.1 06/16/2022   HCT 35.8 (L) 06/16/2022   PLT 310 06/16/2022   GLUCOSE 115 (H) 04/09/2022   CHOL 154 10/10/2021   TRIG 77.0 10/10/2021   HDL 54.70 10/10/2021   LDLCALC 84 10/10/2021   ALT 18 04/03/2022   AST 19 04/03/2022   NA 138 04/09/2022   K 3.8 04/09/2022   CL 108 04/09/2022   CREATININE 0.83 04/09/2022   BUN 6 (L)  04/09/2022   CO2 23 04/09/2022   TSH 2.29 12/13/2020   HGBA1C 6.4 10/10/2021    No results found.  Assessment & Plan:   Problem List Items Addressed This Visit   None   I spent a total of   minutes with this patient in a face to face visit on the date of this encounter reviewing the last office visit with me in       ,  most recent visit with cardiology ,    ,  patient's diet and exercise habits, home blood pressure /blod sugar readings, recent ER visit including labs and imaging studies ,   and post visit ordering of testing and therapeutics.    Follow-up: No follow-ups on file.   10/12/2021, MD

## 2022-06-17 NOTE — Assessment & Plan Note (Signed)
2 episodes of lower GI bleeding from transverse colon were treated with IR embolization but required transfusion . Most recent episode Sept 2023

## 2022-06-18 NOTE — Assessment & Plan Note (Signed)
Patient received 1 unit of packed red blood cells on 04/04/2022.  2 units of packed red blood cells overnight on 04/05/2022.  She has recovered after 4.  IV iron given on 04/07/2022.  Advised to resume oral iron every other day   Lab Results  Component Value Date   WBC 9.0 06/16/2022   HGB 12.1 06/16/2022   HCT 35.8 (L) 06/16/2022   MCV 74.4 (L) 06/16/2022   PLT 310 06/16/2022

## 2022-06-22 ENCOUNTER — Telehealth: Payer: Self-pay | Admitting: Oncology

## 2022-06-22 NOTE — Telephone Encounter (Signed)
Left Vm with patient and requested she call back to get scheduled for IV iron if she would like (patient can have Venofer per her insurance)---jcs

## 2022-07-06 ENCOUNTER — Other Ambulatory Visit: Payer: Self-pay | Admitting: Oncology

## 2022-07-06 DIAGNOSIS — D509 Iron deficiency anemia, unspecified: Secondary | ICD-10-CM | POA: Insufficient documentation

## 2022-07-06 DIAGNOSIS — D508 Other iron deficiency anemias: Secondary | ICD-10-CM

## 2022-07-07 MED FILL — Iron Sucrose Inj 20 MG/ML (Fe Equiv): INTRAVENOUS | Qty: 10 | Status: AC

## 2022-07-08 ENCOUNTER — Inpatient Hospital Stay: Payer: Medicare HMO | Attending: Oncology

## 2022-07-17 ENCOUNTER — Inpatient Hospital Stay: Payer: Medicare HMO

## 2022-07-17 DIAGNOSIS — H52209 Unspecified astigmatism, unspecified eye: Secondary | ICD-10-CM | POA: Diagnosis not present

## 2022-07-17 DIAGNOSIS — H5213 Myopia, bilateral: Secondary | ICD-10-CM | POA: Diagnosis not present

## 2022-07-17 DIAGNOSIS — H524 Presbyopia: Secondary | ICD-10-CM | POA: Diagnosis not present

## 2022-07-18 ENCOUNTER — Ambulatory Visit: Payer: Self-pay

## 2022-07-22 ENCOUNTER — Inpatient Hospital Stay: Payer: Medicare HMO

## 2022-07-27 ENCOUNTER — Ambulatory Visit: Payer: Medicare HMO | Admitting: Podiatry

## 2022-07-27 DIAGNOSIS — B353 Tinea pedis: Secondary | ICD-10-CM | POA: Diagnosis not present

## 2022-07-27 DIAGNOSIS — B351 Tinea unguium: Secondary | ICD-10-CM | POA: Diagnosis not present

## 2022-07-27 NOTE — Progress Notes (Signed)
  Subjective:  Patient ID: Cheryl Coleman, female    DOB: 19-Jul-1956,  MRN: [id number]  Chief Complaint  Patient presents with   Nail Problem    Thick painful toenails, 4 month follow up    67 y.o. female presents with the above complaint. History confirmed with patient.  Doing much better   Objective:  Physical Exam: warm, good capillary refill, no trophic changes or ulcerative lesions, normal DP and PT pulses, normal sensory exam, and new proximal clearing nail growth hallux, fifth toe has some residual dystrophy, no tinea pedis     Assessment:   1. Onychomycosis   2. Tinea pedis of both feet      Plan:  Patient was evaluated and treated and all questions answered.  Doing much better has very little itching and good nail growth. Think she needs further Lamisil at this point discussed possibility of recurrence.  She will let me know if she has this.   Return if symptoms worsen or fail to improve.

## 2022-07-29 ENCOUNTER — Inpatient Hospital Stay: Payer: Medicare HMO

## 2022-07-31 ENCOUNTER — Ambulatory Visit: Payer: Self-pay

## 2022-08-01 ENCOUNTER — Ambulatory Visit: Payer: Self-pay

## 2022-08-05 MED FILL — Iron Sucrose Inj 20 MG/ML (Fe Equiv): INTRAVENOUS | Qty: 10 | Status: AC

## 2022-08-06 ENCOUNTER — Encounter: Payer: Self-pay | Admitting: Nurse Practitioner

## 2022-08-06 ENCOUNTER — Ambulatory Visit (INDEPENDENT_AMBULATORY_CARE_PROVIDER_SITE_OTHER): Payer: Medicare HMO | Admitting: Nurse Practitioner

## 2022-08-06 ENCOUNTER — Inpatient Hospital Stay: Payer: Medicare HMO | Attending: Oncology

## 2022-08-06 VITALS — BP 128/80 | HR 80 | Temp 97.7°F | Ht 64.0 in | Wt 218.0 lb

## 2022-08-06 DIAGNOSIS — J40 Bronchitis, not specified as acute or chronic: Secondary | ICD-10-CM

## 2022-08-06 MED ORDER — PSEUDOEPH-BROMPHEN-DM 30-2-10 MG/5ML PO SYRP: 5.0000 mL | ORAL_SOLUTION | Freq: Four times a day (QID) | ORAL | 0 refills | Status: DC | PRN

## 2022-08-06 MED ORDER — DOXYCYCLINE HYCLATE 100 MG PO TABS: 100.0000 mg | ORAL_TABLET | Freq: Two times a day (BID) | ORAL | 0 refills | Status: DC

## 2022-08-06 NOTE — Progress Notes (Signed)
Cheryl Morrow, NP-C Phone: [phone]  Cheryl Coleman is a 67 y.o. female who presents today for cough, congestion and sore throat. Patient reports symptoms have been off and on x 2 months and are usually resolved with Mucinex and Flonase. She reports worsening symptoms with right ear pain x 1 week that are not resolving with OTC treatment. She took a COVID test last week at home that was negative. Her symptoms are worse at night. She is using her albuterol inhaler PRN.  Respiratory illness:  Cough- Yes  Congestion-    Sinus- Yes, nasal   Chest- Yes  Post nasal drip- Yes  Sore throat- Yes  Shortness of breath- No  Fever- No  Fatigue/Myalgia- No Headache- No Nausea/Vomiting- No Taste disturbance- No  Smell disturbance- No  Covid exposure- No  Covid vaccination- x 3  Flu vaccination- UTD  Medications- Mucinex and Flonase   Social History   Tobacco Use  Smoking Status Never  Smokeless Tobacco Never    Current Outpatient Medications on File Prior to Visit  Medication Sig Dispense Refill   albuterol (VENTOLIN HFA) 108 (90 Base) MCG/ACT inhaler INHALE 1-2 PUFFS BY MOUTH EVERY 6 (SIX) HOURS AS NEEDED 18 each 11   atorvastatin (LIPITOR) 10 MG tablet TAKE 1 TABLET (10 MG TOTAL) BY MOUTH DAILY AT 6 PM. 90 tablet 3   citalopram (CELEXA) 10 MG tablet Take 1 tablet (10 mg total) by mouth daily. 90 tablet 3   diltiazem (CARDIZEM CD) 120 MG 24 hr capsule Take 1 capsule (120 mg total) by mouth 2 (two) times daily. 180 capsule 3   fluticasone (FLONASE) 50 MCG/ACT nasal spray SPRAY 2 SPRAYS INTO EACH NOSTRIL EVERY DAY (Patient taking differently: Place 2 sprays into both nostrils daily as needed for allergies or rhinitis.) 48 mL 3   montelukast (SINGULAIR) 10 MG tablet TAKE 1 TABLET BY MOUTH EVERYDAY AT BEDTIME (Patient taking differently: Take 10 mg by mouth at bedtime.) 90 tablet 3   omeprazole (PRILOSEC) 40 MG capsule TAKE 1 CAPSULE BY MOUTH TWICE A DAY 180 capsule 0   Specialty Vitamins  Products (WOMENS VITA PAK PO) Take by mouth daily.     No current facility-administered medications on file prior to visit.     ROS see history of present illness  Objective  Physical Exam Vitals:   08/06/22 0834  BP: 128/80  Pulse: 80  Temp: 97.7 F (36.5 C)  SpO2: 95%    BP Readings from Last 3 Encounters:  08/06/22 128/80  06/17/22 126/74  05/11/22 108/66   Wt Readings from Last 3 Encounters:  08/06/22 218 lb (98.9 kg)  06/17/22 218 lb 3.2 oz (99 kg)  05/11/22 218 lb (98.9 kg)    Physical Exam Constitutional:      General: She is not in acute distress.    Appearance: Normal appearance.  HENT:     Head: Normocephalic.     Right Ear: Tympanic membrane normal.     Left Ear: Tympanic membrane normal.     Nose: Nose normal.     Mouth/Throat:     Mouth: Mucous membranes are moist.     Pharynx: Oropharynx is clear.  Eyes:     Conjunctiva/sclera: Conjunctivae normal.     Pupils: Pupils are equal, round, and reactive to light.  Cardiovascular:     Rate and Rhythm: Normal rate and regular rhythm.     Heart sounds: Normal heart sounds.  Pulmonary:     Effort: Pulmonary effort is normal. No respiratory distress.  Breath sounds: Normal breath sounds. No wheezing.  Abdominal:     General: Abdomen is flat. Bowel sounds are normal.     Palpations: Abdomen is soft.  Skin:    General: Skin is warm and dry.  Neurological:     Mental Status: She is alert.  Psychiatric:        Mood and Affect: Mood normal.        Behavior: Behavior normal.    Assessment/Plan: Please see individual problem list.  Bronchitis Assessment & Plan: Due to length of symptoms will treat with Doxy 100 BID x 7 days and Bromfed DM for cough. Advised patient to continue Flonase and her Albuterol Inhaler PRN. Encouraged adequate hydration.   Orders: -     Pseudoeph-Bromphen-DM; Take 5-10 mLs by mouth every 6 (six) hours as needed.  Dispense: 120 mL; Refill: 0 -     Doxycycline Hyclate; Take  1 tablet (100 mg total) by mouth 2 (two) times daily.  Dispense: 14 tablet; Refill: 0   Return if symptoms worsen or fail to improve.   Cheryl Morrow, NP-C [place]

## 2022-08-06 NOTE — Assessment & Plan Note (Signed)
Due to length of symptoms will treat with Doxy 100 BID x 7 days and Bromfed DM for cough. Advised patient to continue Flonase and her Albuterol Inhaler PRN. Encouraged adequate hydration.

## 2022-08-07 ENCOUNTER — Telehealth: Payer: Self-pay | Admitting: Internal Medicine

## 2022-08-07 MED FILL — Iron Sucrose Inj 20 MG/ML (Fe Equiv): INTRAVENOUS | Qty: 10 | Status: AC

## 2022-08-07 NOTE — Telephone Encounter (Signed)
Pt called in staying that she saw [facility] yesterday, and she prescribed her some antibiotics, however, she just found out that some of the side effects of this meds its GI bleed and colon problem, as per pt she has a history of GI bleed and she concern. She would like some advice on this issue. She wouls like Dr. Derrel Nip to know this.

## 2022-08-10 ENCOUNTER — Inpatient Hospital Stay: Payer: Medicare HMO

## 2022-08-10 MED ORDER — AMOXICILLIN-POT CLAVULANATE 875-125 MG PO TABS: 1.0000 | ORAL_TABLET | Freq: Two times a day (BID) | ORAL | 0 refills | Status: DC

## 2022-08-10 NOTE — Telephone Encounter (Signed)
Pt is aware that medication has been sent in.  

## 2022-08-10 NOTE — Telephone Encounter (Signed)
Spoke with pt and she stated that she saw Tomasita Morrow, NP last week for a sinus infection and was prescribed doxycycline. Pt stated that she did not take it because of the side effects of a GI bleed and pt has had a "severe" GI bleed and hemorrhage before so she was very nervous to try it. Pt is leaving to go to [place] tomorrow and would like to have a different antibiotic sent in. Tomasita Morrow, NP is not in the office today.

## 2022-08-14 ENCOUNTER — Encounter: Payer: Self-pay | Admitting: Pharmacist

## 2022-08-17 ENCOUNTER — Inpatient Hospital Stay: Payer: Medicare HMO

## 2022-08-21 ENCOUNTER — Encounter: Payer: Medicare HMO | Admitting: Internal Medicine

## 2022-08-24 ENCOUNTER — Inpatient Hospital Stay: Payer: Medicare HMO

## 2022-08-26 IMAGING — MG MM DIGITAL SCREENING BILAT W/ TOMO AND CAD
6 of 12 series · 6 of 36 positions shown · non-contrast
Comparison: Previous exam(s).

CLINICAL DATA: Screening.

EXAM:
DIGITAL SCREENING BILATERAL MAMMOGRAM WITH TOMOSYNTHESIS AND CAD
TECHNIQUE: Bilateral screening digital craniocaudal and mediolateral oblique
mammograms were obtained. Bilateral screening digital breast
tomosynthesis was performed. The images were evaluated with
computer-aided detection.

[L MLO synth-2D (1 of 2)]
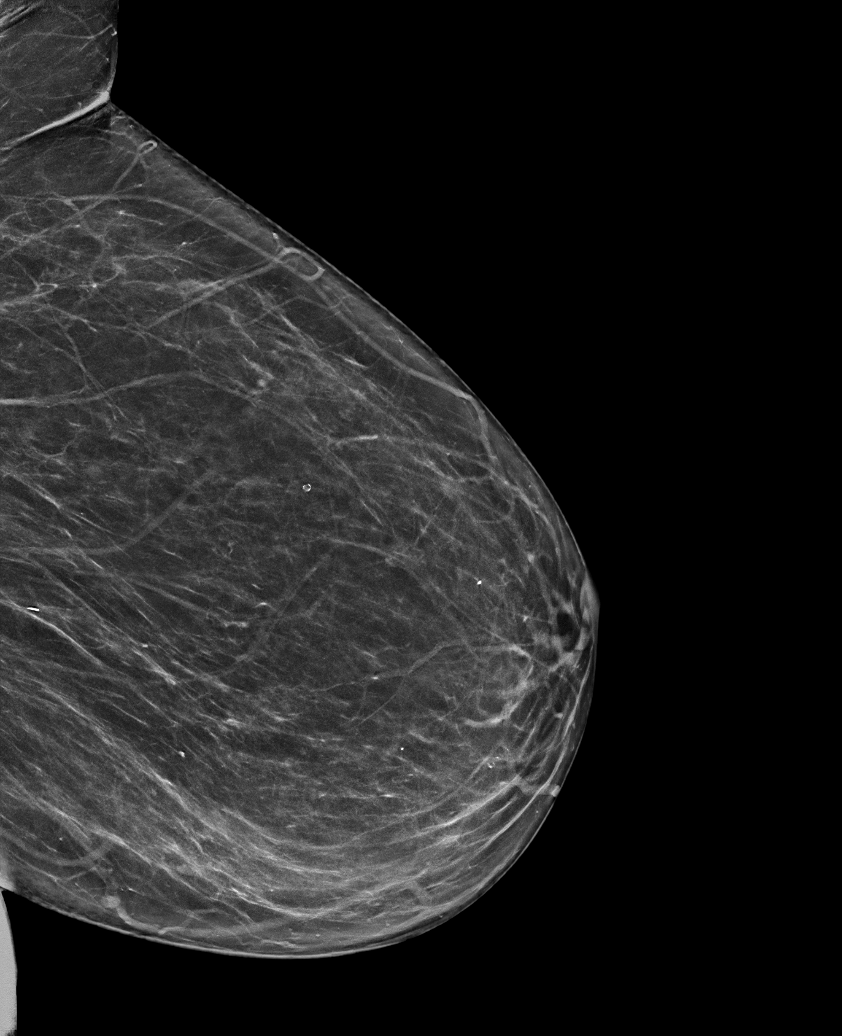

[L MLO synth-2D (2 of 2)]
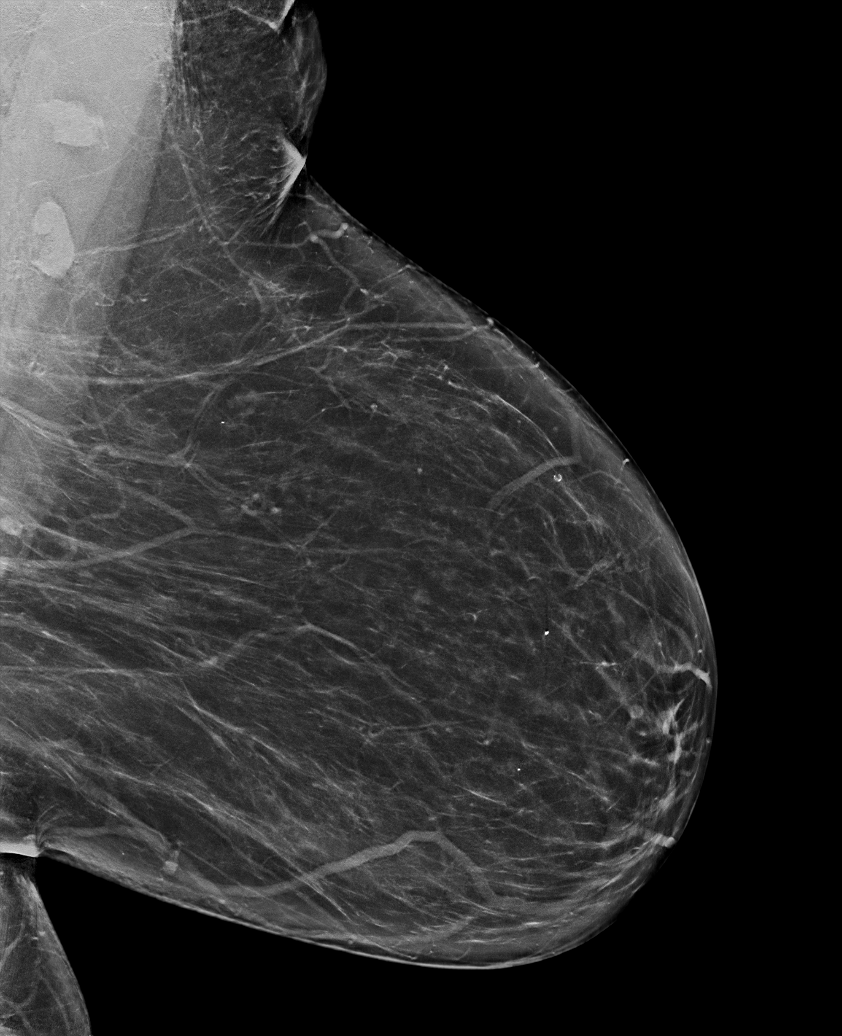

[L CC synth-2D]
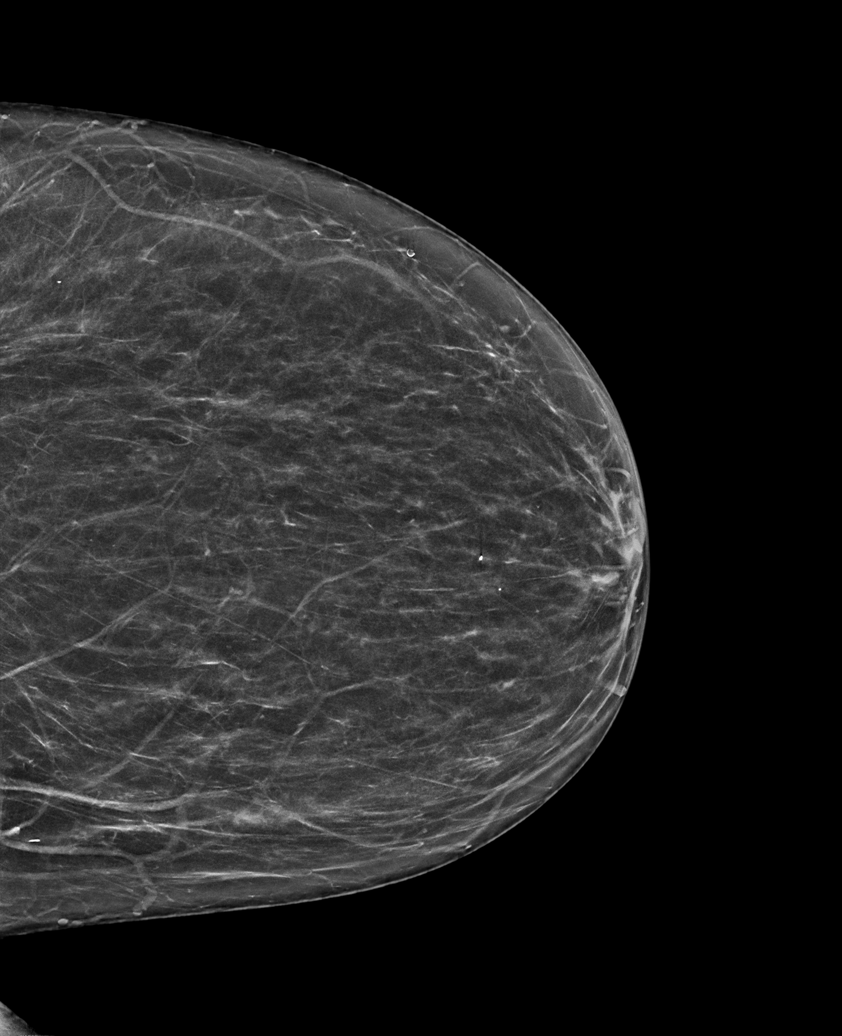

[R MLO synth-2D (1 of 2)]
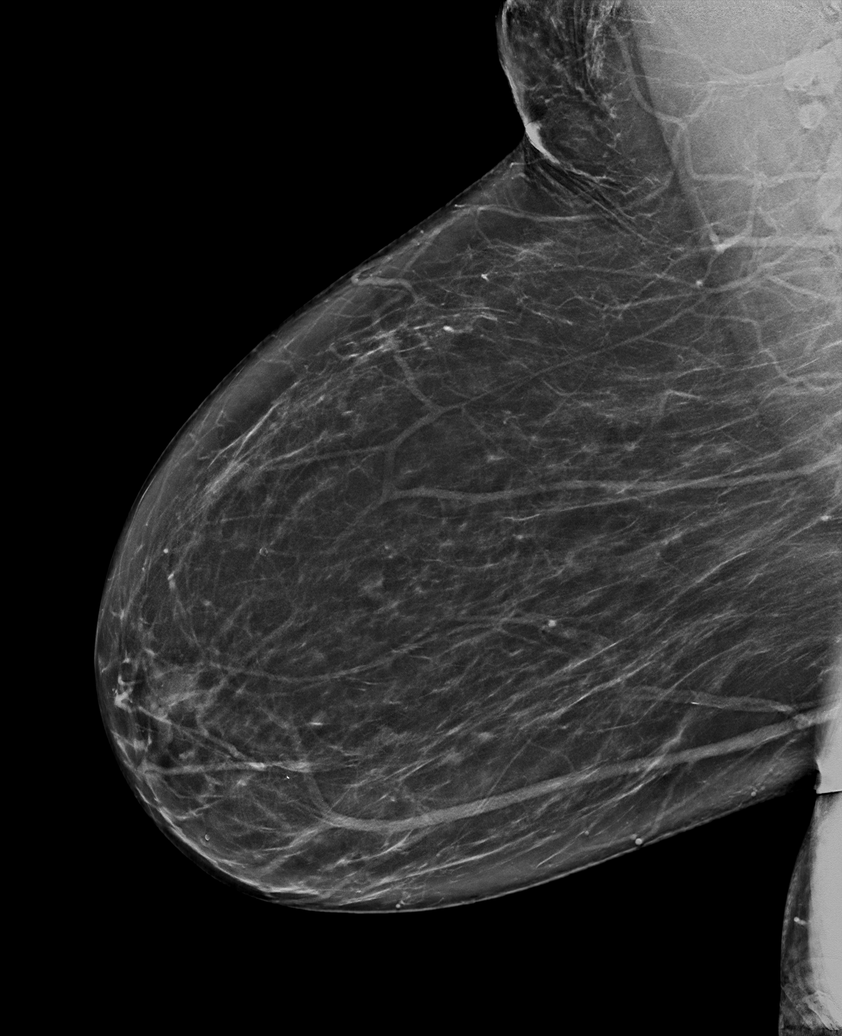

[R CC synth-2D]
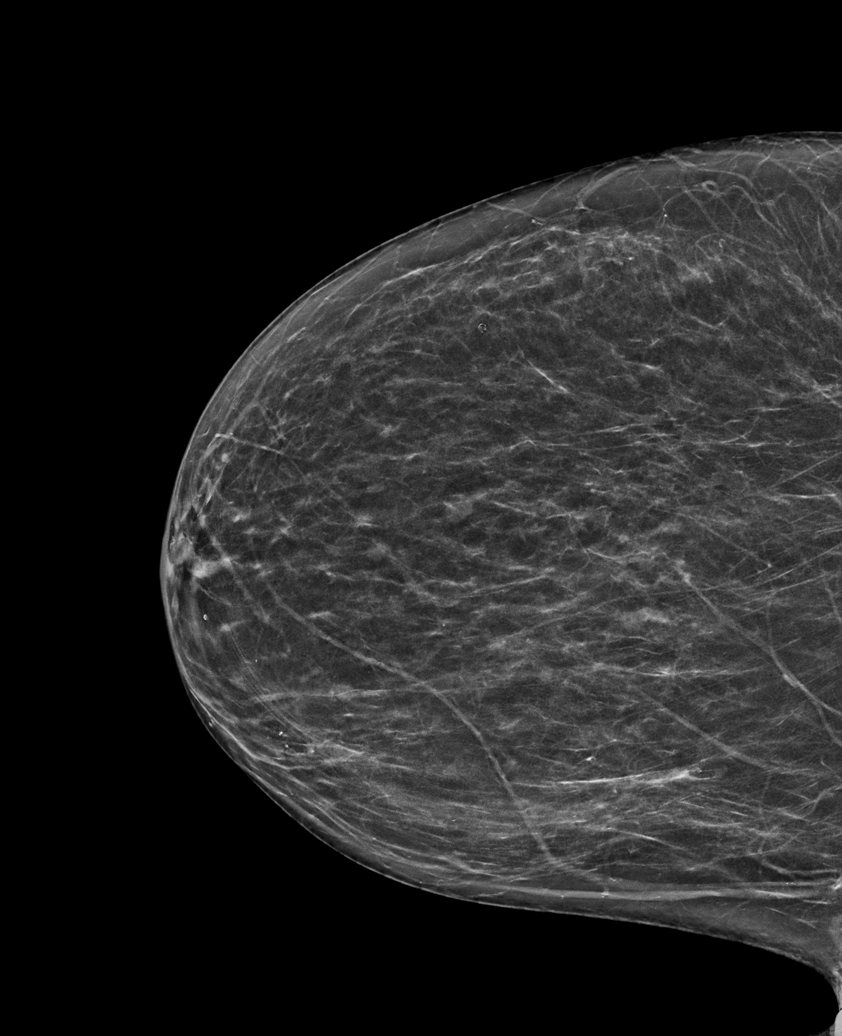

[R MLO synth-2D (2 of 2)]
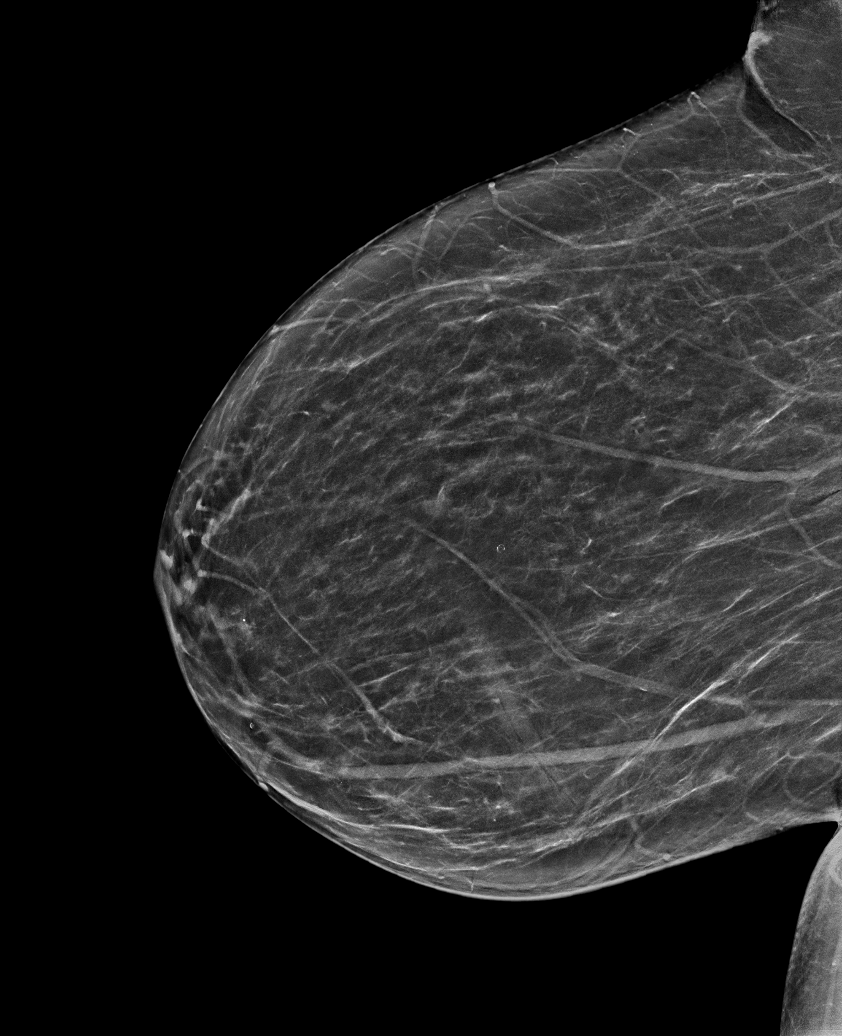

[6 of 36 positions shown; findings below may reference images not displayed]

ACR Breast Density Category b: There are scattered areas of
fibroglandular density.
FINDINGS: There are no findings suspicious for malignancy.
IMPRESSION: No mammographic evidence of malignancy. A result letter of this
screening mammogram will be mailed directly to the patient.

RECOMMENDATION:
Screening mammogram in one year. (Code:51-O-LD2)

BI-RADS CATEGORY  1: Negative.

## 2022-08-31 ENCOUNTER — Inpatient Hospital Stay: Payer: Medicare HMO

## 2022-09-14 ENCOUNTER — Encounter: Payer: Self-pay | Admitting: Oncology

## 2022-09-17 ENCOUNTER — Other Ambulatory Visit: Payer: Self-pay | Admitting: *Deleted

## 2022-09-17 ENCOUNTER — Telehealth: Payer: Self-pay | Admitting: *Deleted

## 2022-09-17 DIAGNOSIS — D72820 Lymphocytosis (symptomatic): Secondary | ICD-10-CM

## 2022-09-17 DIAGNOSIS — D508 Other iron deficiency anemias: Secondary | ICD-10-CM

## 2022-09-17 NOTE — Telephone Encounter (Signed)
Sherry/ megan- please cancel her IV iron infusions. We will get cbc ferritin and iron studies sometime during the week of 3/25 and I will see her a couple of days after labs. Based on labs we will decide if she needs iv iron or not

## 2022-09-17 NOTE — Telephone Encounter (Signed)
Patient called reporting that she was to have gotten Iron infusion in Dec but was [place] and [place] not [place] them. She has appts scheduled for March starting on the 12 th and is asking if she needs to have labs checked before she gets her iron or not. Pleases advise

## 2022-09-29 ENCOUNTER — Ambulatory Visit (INDEPENDENT_AMBULATORY_CARE_PROVIDER_SITE_OTHER): Payer: Medicare HMO | Admitting: Internal Medicine

## 2022-09-29 ENCOUNTER — Ambulatory Visit: Payer: Medicare HMO

## 2022-09-29 ENCOUNTER — Encounter: Payer: Self-pay | Admitting: Internal Medicine

## 2022-09-29 VITALS — BP 132/76 | HR 88 | Temp 97.9°F | Ht 64.0 in | Wt 215.0 lb

## 2022-09-29 DIAGNOSIS — J301 Allergic rhinitis due to pollen: Secondary | ICD-10-CM

## 2022-09-29 DIAGNOSIS — R5383 Other fatigue: Secondary | ICD-10-CM

## 2022-09-29 DIAGNOSIS — I10 Essential (primary) hypertension: Secondary | ICD-10-CM | POA: Diagnosis not present

## 2022-09-29 DIAGNOSIS — R350 Frequency of micturition: Secondary | ICD-10-CM

## 2022-09-29 DIAGNOSIS — K219 Gastro-esophageal reflux disease without esophagitis: Secondary | ICD-10-CM

## 2022-09-29 DIAGNOSIS — J019 Acute sinusitis, unspecified: Secondary | ICD-10-CM | POA: Diagnosis not present

## 2022-09-29 DIAGNOSIS — K5731 Diverticulosis of large intestine without perforation or abscess with bleeding: Secondary | ICD-10-CM

## 2022-09-29 DIAGNOSIS — Z862 Personal history of diseases of the blood and blood-forming organs and certain disorders involving the immune mechanism: Secondary | ICD-10-CM

## 2022-09-29 DIAGNOSIS — R7303 Prediabetes: Secondary | ICD-10-CM | POA: Diagnosis not present

## 2022-09-29 DIAGNOSIS — D509 Iron deficiency anemia, unspecified: Secondary | ICD-10-CM | POA: Diagnosis not present

## 2022-09-29 DIAGNOSIS — E785 Hyperlipidemia, unspecified: Secondary | ICD-10-CM

## 2022-09-29 DIAGNOSIS — F419 Anxiety disorder, unspecified: Secondary | ICD-10-CM

## 2022-09-29 DIAGNOSIS — R69 Illness, unspecified: Secondary | ICD-10-CM | POA: Diagnosis not present

## 2022-09-29 DIAGNOSIS — J4520 Mild intermittent asthma, uncomplicated: Secondary | ICD-10-CM

## 2022-09-29 LAB — COMPREHENSIVE METABOLIC PANEL WITH GFR
ALT: 10 U/L (ref 0–35)
AST: 13 U/L (ref 0–37)
Albumin: 4.2 g/dL (ref 3.5–5.2)
Alkaline Phosphatase: 63 U/L (ref 39–117)
BUN: 10 mg/dL (ref 6–23)
CO2: 29 meq/L (ref 19–32)
Calcium: 9.8 mg/dL (ref 8.4–10.5)
Chloride: 102 meq/L (ref 96–112)
Creatinine, Ser: 0.85 mg/dL (ref 0.40–1.20)
GFR: 70.52 mL/min
Glucose, Bld: 86 mg/dL (ref 70–99)
Potassium: 4.1 meq/L (ref 3.5–5.1)
Sodium: 139 meq/L (ref 135–145)
Total Bilirubin: 0.4 mg/dL (ref 0.2–1.2)
Total Protein: 7.9 g/dL (ref 6.0–8.3)

## 2022-09-29 LAB — CBC WITH DIFFERENTIAL/PLATELET
Basophils Absolute: 0 K/uL (ref 0.0–0.1)
Basophils Relative: 0.4 % (ref 0.0–3.0)
Eosinophils Absolute: 0.2 K/uL (ref 0.0–0.7)
Eosinophils Relative: 2.8 % (ref 0.0–5.0)
HCT: 36.9 % (ref 36.0–46.0)
Hemoglobin: 12.5 g/dL (ref 12.0–15.0)
Lymphocytes Relative: 41.7 % (ref 12.0–46.0)
Lymphs Abs: 3.5 K/uL (ref 0.7–4.0)
MCHC: 33.9 g/dL (ref 30.0–36.0)
MCV: 75.5 fl — ABNORMAL LOW (ref 78.0–100.0)
Monocytes Absolute: 0.5 K/uL (ref 0.1–1.0)
Monocytes Relative: 6.5 % (ref 3.0–12.0)
Neutro Abs: 4.1 K/uL (ref 1.4–7.7)
Neutrophils Relative %: 48.6 % (ref 43.0–77.0)
Platelets: 271 K/uL (ref 150.0–400.0)
RBC: 4.89 Mil/uL (ref 3.87–5.11)
RDW: 20.4 % — ABNORMAL HIGH (ref 11.5–15.5)
WBC: 8.3 K/uL (ref 4.0–10.5)

## 2022-09-29 LAB — URINALYSIS, ROUTINE W REFLEX MICROSCOPIC
Bilirubin Urine: NEGATIVE
Hgb urine dipstick: NEGATIVE
Ketones, ur: NEGATIVE
Leukocytes,Ua: NEGATIVE
Nitrite: NEGATIVE
RBC / HPF: NONE SEEN
Specific Gravity, Urine: 1.01 (ref 1.000–1.030)
Total Protein, Urine: NEGATIVE
Urine Glucose: NEGATIVE
Urobilinogen, UA: 0.2 (ref 0.0–1.0)
pH: 7 (ref 5.0–8.0)

## 2022-09-29 LAB — HEMOGLOBIN A1C: Hgb A1c MFr Bld: 5.9 % (ref 4.6–6.5)

## 2022-09-29 LAB — LIPID PANEL
Cholesterol: 174 mg/dL (ref 0–200)
HDL: 71.6 mg/dL
LDL Cholesterol: 89 mg/dL (ref 0–99)
NonHDL: 102.02
Total CHOL/HDL Ratio: 2
Triglycerides: 67 mg/dL (ref 0.0–149.0)
VLDL: 13.4 mg/dL (ref 0.0–40.0)

## 2022-09-29 LAB — IBC + FERRITIN
Ferritin: 10 ng/mL (ref 10.0–291.0)
Iron: 54 ug/dL (ref 42–145)
Saturation Ratios: 13 % — ABNORMAL LOW (ref 20.0–50.0)
TIBC: 414.4 ug/dL (ref 250.0–450.0)
Transferrin: 296 mg/dL (ref 212.0–360.0)

## 2022-09-29 LAB — MICROALBUMIN / CREATININE URINE RATIO
Creatinine,U: 88.8 mg/dL
Microalb Creat Ratio: 9.1 mg/g (ref 0.0–30.0)
Microalb, Ur: 8.1 mg/dL — ABNORMAL HIGH (ref 0.0–1.9)

## 2022-09-29 LAB — LDL CHOLESTEROL, DIRECT: Direct LDL: 86 mg/dL

## 2022-09-29 LAB — TSH: TSH: 1.05 u[IU]/mL (ref 0.35–5.50)

## 2022-09-29 MED ORDER — ATORVASTATIN CALCIUM 10 MG PO TABS: 10.0000 mg | ORAL_TABLET | Freq: Every day | ORAL | 3 refills | Status: DC

## 2022-09-29 MED ORDER — OMEPRAZOLE 40 MG PO CPDR: 40.0000 mg | DELAYED_RELEASE_CAPSULE | Freq: Two times a day (BID) | ORAL | 3 refills | Status: DC

## 2022-09-29 MED ORDER — MONTELUKAST SODIUM 10 MG PO TABS: 10.0000 mg | ORAL_TABLET | Freq: Every day | ORAL | 3 refills | Status: DC

## 2022-09-29 MED ORDER — CITALOPRAM HYDROBROMIDE 10 MG PO TABS: 10.0000 mg | ORAL_TABLET | Freq: Every day | ORAL | 3 refills | Status: DC

## 2022-09-29 NOTE — Patient Instructions (Addendum)
Try using colace (docusate)instead of bulk forming laxative.  You can use  up to 200 mg per night   Post void residual ultrasound of bladder  has been ordered  I will send the CBC and iron studies to Dr Janese Banks

## 2022-09-29 NOTE — Progress Notes (Signed)
Subjective:  Patient ID: Cheryl Coleman, female    DOB: [birth year]  Age: 67 y.o. MRN: [id number]  CC: The primary encounter diagnosis was Essential hypertension. Diagnoses of Hyperlipidemia, unspecified hyperlipidemia type, Anxiety, Mild intermittent asthma, unspecified whether complicated, Gastroesophageal reflux disease without esophagitis, Prediabetes, Other fatigue, Iron deficiency anemia, unspecified iron deficiency anemia type, Urinary frequency, Mild intermittent asthma without complication, Seasonal allergic rhinitis due to pollen, Diverticulosis of colon with hemorrhage, Acute non-recurrent sinusitis, unspecified location, and History of iron deficiency anemia were also pertinent to this visit.   HPI Ray Giovannoni presents for  Chief Complaint  Patient presents with  . Medical Management of Chronic Issues   1) history of lower GI bleed:  no recent bleeds  using metamucil but doesn't like the bloating,  has not tried colace.  Staying well hydrated.  But having nocturia  2) IDA:  secondary to GI bleed  .  Did not get infusions due to travel .  Needs cbc and iron checked   3) Seasonal rhinitis:  managed with Singulair ;uses  flonase prn for congestion and drainage   4) Recent bronchitis resolved without prednisone    Outpatient Medications Prior to Visit  Medication Sig Dispense Refill  . albuterol (VENTOLIN HFA) 108 (90 Base) MCG/ACT inhaler INHALE 1-2 PUFFS BY MOUTH EVERY 6 (SIX) HOURS AS NEEDED 18 each 11  . diltiazem (CARDIZEM CD) 120 MG 24 hr capsule Take 1 capsule (120 mg total) by mouth 2 (two) times daily. 180 capsule 3  . fluticasone (FLONASE) 50 MCG/ACT nasal spray SPRAY 2 SPRAYS INTO EACH NOSTRIL EVERY DAY (Patient taking differently: Place 2 sprays into both nostrils daily as needed for allergies or rhinitis.) 48 mL 3  . Specialty Vitamins Products (WOMENS VITA PAK PO) Take by mouth daily.    Marland Kitchen amoxicillin-clavulanate (AUGMENTIN) 875-125 MG tablet Take 1 tablet by mouth 2  (two) times daily. 14 tablet 0  . atorvastatin (LIPITOR) 10 MG tablet TAKE 1 TABLET (10 MG TOTAL) BY MOUTH DAILY AT 6 PM. 90 tablet 3  . brompheniramine-pseudoephedrine-DM 30-2-10 MG/5ML syrup Take 5-10 mLs by mouth every 6 (six) hours as needed. 120 mL 0  . citalopram (CELEXA) 10 MG tablet Take 1 tablet (10 mg total) by mouth daily. 90 tablet 3  . montelukast (SINGULAIR) 10 MG tablet TAKE 1 TABLET BY MOUTH EVERYDAY AT BEDTIME (Patient taking differently: Take 10 mg by mouth at bedtime.) 90 tablet 3  . omeprazole (PRILOSEC) 40 MG capsule TAKE 1 CAPSULE BY MOUTH TWICE A DAY 180 capsule 0   No facility-administered medications prior to visit.    Review of Systems;  Patient denies headache, fevers, malaise, unintentional weight loss, skin rash, eye pain, sinus congestion and sinus pain, sore throat, dysphagia,  hemoptysis , cough, dyspnea, wheezing, chest pain, palpitations, orthopnea, edema, abdominal pain, nausea, melena, diarrhea, constipation, flank pain, dysuria, hematuria, urinary  Frequency, nocturia, numbness, tingling, seizures,  Focal weakness, Loss of consciousness,  Tremor, insomnia, depression, anxiety, and suicidal ideation.      Objective:  BP 132/76   Pulse 88   Temp 97.9 F (36.6 C) (Oral)   Ht '5\' 4"'$  (1.626 m)   Wt 215 lb (97.5 kg)   SpO2 98%   BMI 36.90 kg/m   BP Readings from Last 3 Encounters:  09/29/22 132/76  08/06/22 128/80  06/17/22 126/74    Wt Readings from Last 3 Encounters:  09/29/22 215 lb (97.5 kg)  08/06/22 218 lb (98.9 kg)  06/17/22 218 lb 3.2 oz (  99 kg)    Physical Exam Vitals reviewed.  Constitutional:      General: She is not in acute distress.    Appearance: Normal appearance. She is normal weight. She is not ill-appearing, toxic-appearing or diaphoretic.  HENT:     Head: Normocephalic.  Eyes:     General: No scleral icterus.       Right eye: No discharge.        Left eye: No discharge.     Conjunctiva/sclera: Conjunctivae normal.   Cardiovascular:     Rate and Rhythm: Normal rate and regular rhythm.     Heart sounds: Normal heart sounds.  Pulmonary:     Effort: Pulmonary effort is normal. No respiratory distress.     Breath sounds: Normal breath sounds.  Musculoskeletal:        General: Normal range of motion.  Skin:    General: Skin is warm and dry.  Neurological:     General: No focal deficit present.     Mental Status: She is alert and oriented to person, place, and time. Mental status is at baseline.  Psychiatric:        Mood and Affect: Mood normal.        Behavior: Behavior normal.        Thought Content: Thought content normal.        Judgment: Judgment normal.   Lab Results  Component Value Date   HGBA1C 6.4 10/10/2021   HGBA1C 6.0 07/08/2021   HGBA1C 5.9 12/13/2020    Lab Results  Component Value Date   CREATININE 0.83 04/09/2022   CREATININE 0.78 04/08/2022   CREATININE 0.74 04/07/2022    Lab Results  Component Value Date   WBC 9.0 06/16/2022   HGB 12.1 06/16/2022   HCT 35.8 (L) 06/16/2022   PLT 310 06/16/2022   GLUCOSE 115 (H) 04/09/2022   CHOL 154 10/10/2021   TRIG 77.0 10/10/2021   HDL 54.70 10/10/2021   LDLCALC 84 10/10/2021   ALT 18 04/03/2022   AST 19 04/03/2022   NA 138 04/09/2022   K 3.8 04/09/2022   CL 108 04/09/2022   CREATININE 0.83 04/09/2022   BUN 6 (L) 04/09/2022   CO2 23 04/09/2022   TSH 2.29 12/13/2020   HGBA1C 6.4 10/10/2021    No results found.  Assessment & Plan:  .Essential hypertension -     Comprehensive metabolic panel -     Microalbumin / creatinine urine ratio  Hyperlipidemia, unspecified hyperlipidemia type Assessment & Plan: mNged with atorvastatin 10 mg.  LFts and lipids are due  Lab Results  Component Value Date   CHOL 154 10/10/2021   HDL 54.70 10/10/2021   LDLCALC 84 10/10/2021   TRIG 77.0 10/10/2021   CHOLHDL 3 10/10/2021     Orders: -     Atorvastatin Calcium; Take 1 tablet (10 mg total) by mouth daily at 6 PM.  Dispense:  90 tablet; Refill: 3 -     Lipid panel -     LDL cholesterol, direct -     Comprehensive metabolic panel  Anxiety -     Citalopram Hydrobromide; Take 1 tablet (10 mg total) by mouth daily.  Dispense: 90 tablet; Refill: 3  Mild intermittent asthma, unspecified whether complicated Assessment & Plan: Did not tolerate Breo secondary to side effects.  Continue singulair, flonase. Has been asymptomatic   Orders: -     Montelukast Sodium; Take 1 tablet (10 mg total) by mouth at bedtime. TAKE 1 TABLET BY MOUTH  EVERYDAY AT BEDTIME Strength: 10 mg  Dispense: 90 tablet; Refill: 3  Gastroesophageal reflux disease without esophagitis -     Omeprazole; Take 1 capsule (40 mg total) by mouth 2 (two) times daily.  Dispense: 180 capsule; Refill: 3  Prediabetes -     Hemoglobin A1c  Other fatigue -     TSH  Iron deficiency anemia, unspecified iron deficiency anemia type -     IBC + Ferritin -     CBC with Differential/Platelet  Urinary frequency -     US PELVIS LIMITED (TRANSABDOMINAL ONLY); Future -     Urinalysis, Routine w reflex microscopic  Mild intermittent asthma without complication Assessment & Plan: Asymptomatic. Continue useof Singulair daily and prn flonase    Seasonal allergic rhinitis due to pollen Assessment & Plan: Managed with singulair and prn flonase.    Diverticulosis of colon with hemorrhage Assessment & Plan: No recurrent bleeds since addressing constipation. Advise to try using colace alternating with metamucil to reduce bloating    Acute non-recurrent sinusitis, unspecified location Assessment & Plan: Resolved without complications with oral antibiotics.  Reminded to take probiotics with all abx    History of iron deficiency anemia Assessment & Plan: Secondary to lower GI bleed (diverticular, sept 2023) /  she Did not receive infusions and does not tolerate oral.  Repeat labs are due   Lab Results  Component Value Date   IRON 44 06/16/2022   TIBC 421  06/16/2022   FERRITIN 9 (L) 06/16/2022   Lab Results  Component Value Date   WBC 9.0 06/16/2022   HGB 12.1 06/16/2022   HCT 35.8 (L) 06/16/2022   MCV 74.4 (L) 06/16/2022   PLT 310 06/16/2022         I provided 8 minutes of face-to-face time during this encounter reviewing patient's last visit with Katy Fitch for bronchitis,  patient's  most recent visit with hematology,  gastroenterology , her September 2023 hospitalization ,  recent surgical and non surgical procedures, previous  labs and imaging studies, counseling on currently addressed issues,  and post visit ordering to diagnostics and therapeutics .   Follow-up: Return in about 6 months (around 04/01/2023).   Crecencio Mc, MD

## 2022-09-29 NOTE — Assessment & Plan Note (Signed)
Secondary to lower GI bleed (diverticular, sept 2023) /  she Did not receive infusions and does not tolerate oral.  Repeat labs are due   Lab Results  Component Value Date   IRON 44 06/16/2022   TIBC 421 06/16/2022   FERRITIN 9 (L) 06/16/2022   Lab Results  Component Value Date   WBC 9.0 06/16/2022   HGB 12.1 06/16/2022   HCT 35.8 (L) 06/16/2022   MCV 74.4 (L) 06/16/2022   PLT 310 06/16/2022

## 2022-09-29 NOTE — Assessment & Plan Note (Signed)
Asymptomatic. Continue useof Singulair daily and prn flonase

## 2022-09-29 NOTE — Assessment & Plan Note (Signed)
Resolved without complications with oral antibiotics.  Reminded to take probiotics with all abx

## 2022-09-29 NOTE — Assessment & Plan Note (Addendum)
Did not tolerate Breo secondary to side effects.  Continue singulair, flonase. Has been asymptomatic

## 2022-09-29 NOTE — Assessment & Plan Note (Signed)
No recurrent bleeds since addressing constipation. Advise to try using colace alternating with metamucil to reduce bloating

## 2022-09-29 NOTE — Assessment & Plan Note (Signed)
mNged with atorvastatin 10 mg.  LFts and lipids are due  Lab Results  Component Value Date   CHOL 154 10/10/2021   HDL 54.70 10/10/2021   LDLCALC 84 10/10/2021   TRIG 77.0 10/10/2021   CHOLHDL 3 10/10/2021

## 2022-09-29 NOTE — Assessment & Plan Note (Signed)
Managed with singulair and prn flonase.

## 2022-10-01 ENCOUNTER — Ambulatory Visit: Payer: Medicare HMO

## 2022-10-01 ENCOUNTER — Other Ambulatory Visit: Payer: Self-pay | Admitting: Internal Medicine

## 2022-10-01 DIAGNOSIS — I10 Essential (primary) hypertension: Secondary | ICD-10-CM

## 2022-10-01 MED ORDER — LOSARTAN POTASSIUM 25 MG PO TABS: 25.0000 mg | ORAL_TABLET | Freq: Every day | ORAL | 0 refills | Status: DC

## 2022-10-01 NOTE — Assessment & Plan Note (Signed)
Adding losartan for early microalbuminuria  Lab Results  Component Value Date   LABMICR See below: 08/29/2018   MICROALBUR 8.1 (H) 09/29/2022

## 2022-10-02 ENCOUNTER — Ambulatory Visit
Admission: RE | Admit: 2022-10-02 | Discharge: 2022-10-02 | Disposition: A | Discharge status: Home / Self Care | Payer: Medicare HMO | Source: Ambulatory Visit | Attending: Internal Medicine | Admitting: Internal Medicine

## 2022-10-02 DIAGNOSIS — Z1231 Encounter for screening mammogram for malignant neoplasm of breast: Secondary | ICD-10-CM | POA: Insufficient documentation

## 2022-10-05 ENCOUNTER — Ambulatory Visit: Payer: Medicare HMO

## 2022-10-05 ENCOUNTER — Telehealth: Payer: Self-pay | Admitting: *Deleted

## 2022-10-05 NOTE — Telephone Encounter (Signed)
Patient called reporting that her labs were drawn by PCP alast week and sjhe is asking if she needs to come in for infusion  herer or not. Please advise  Component Ref Range & Units 6 d ago (09/29/22) 3 mo ago (06/16/22) 3 mo ago (06/16/22) 5 mo ago (04/15/22) 6 mo ago (04/04/22) 6 mo ago (04/04/22) 8 mo ago (01/29/22)  Iron 42 - 145 ug/dL 54  44 R 46 64 R  52  Transferrin 212.0 - 360.0 mg/dL 296.0   265.0   282.0  Saturation Ratios 20.0 - 50.0 % 13.0 Low   10 Low  R 12.4 Low  21 R  13.2 Low   Ferritin 10.0 - 291.0 ng/mL 10.0 9 Low  R, CM  146.7  22 R, CM 39.4  TIBC 250.0 - 450.0 mcg/dL 414.4  421 R 371.0 305 R  394.8  UIBC   377 R, CM  241 R, CM    Resulting Agency [place] [facility] [place] [facility] [place] [facility] [place] [facility] [place] [facility] [place] [facility]         Specimen Collected: 09/29/22 10:56 Last Resulted: 09/29/22 14:31       Component Ref Range & Units 6 d ago (09/29/22) 3 mo ago (06/16/22) 5 mo ago (04/15/22) 5 mo ago (04/09/22) 6 mo ago (04/08/22) 6 mo ago (04/08/22) 6 mo ago (04/07/22)  WBC 4.0 - 10.5 K/uL 8.3 9.0 10.0 14.9 High   13.6 High  14.8 High   RBC 3.87 - 5.11 Mil/uL 4.89 4.81 R 3.32 Low  3.00 Low  R  2.91 Low  R 3.08 Low  R  Hemoglobin 12.0 - 15.0 g/dL 12.5 12.1 9.6 Low  8.3 Low  8.4 Low  8.1 Low  8.4 Low   HCT 36.0 - 46.0 % 36.9 35.8 Low  27.8 Low  24.7 Low  24.9 Low  CM 23.8 Low  24.9 Low   MCV 78.0 - 100.0 fl 75.5 Low  74.4 Low  R 83.8 82.3 R  81.8 R 80.8 R  MCHC 30.0 - 36.0 g/dL 33.9 33.8 34.4 33.6  34.0 33.7  RDW 11.5 - 15.5 % 20.4 High  16.3 High  18.2 High  17.5 High   16.8 High  16.0 High   Platelets 150.0 - 400.0 K/uL 271.0 310 R 385.0 222 R  172 R 159 R  Neutrophils Relative % 43.0 - 77.0 % 48.6 59 R 67.0 67 R     Lymphocytes Relative 12.0 - 46.0 % 41.7 31 R 21.8 21 R     Monocytes Relative 3.0 - 12.0 % 6.5 7 R 6.9 9 R     Eosinophils Relative 0.0 - 5.0 % 2.8 2 R 3.4 2 R     Basophils Relative 0.0 - 3.0 % 0.4 1 R 0.9 0 R      Neutro Abs 1.4 - 7.7 K/uL 4.1 5.4 R 6.7 10.2 High  R     Lymphs Abs 0.7 - 4.0 K/uL 3.5 2.7 2.2 3.0     Monocytes Absolute 0.1 - 1.0 K/uL 0.5 0.6 0.7 1.3 High      Eosinophils Absolute 0.0 - 0.7 K/uL 0.2 0.2 R 0.3 0.3 R     Basophils Absolute 0.0 - 0.1 K/uL 0.0 0.1 0.1 0.0     MCH  25.2 Low  R  27.7 R  27.8 R 27.3 R  nRBC  0.0 R  0.1 R  0.3 High  R, CM 0.2 R, CM  Immature Granulocytes  0 R  1 R     Abs Immature Granulocytes  0.02 R, CM  0.07 R, CM     Resulting Agency [place] [facility] [place] [facility] [place] [facility] [place] [facility] [place] [facility]         Specimen Collected: 09/29/22 10:56 Last Resulted: 09/29/22 14:04

## 2022-10-05 NOTE — Telephone Encounter (Signed)
She is scheduled for iv iron with my next appt this month

## 2022-10-06 ENCOUNTER — Encounter: Payer: Self-pay | Admitting: Oncology

## 2022-10-06 ENCOUNTER — Encounter: Payer: Self-pay | Admitting: *Deleted

## 2022-10-06 NOTE — Telephone Encounter (Signed)
Lab appointment cancelled and I sent a My Chart message to patient to inform her of this and that she needs to keep her appts on tthe `29 th

## 2022-10-07 ENCOUNTER — Ambulatory Visit: Payer: Medicare HMO

## 2022-10-09 ENCOUNTER — Ambulatory Visit
Admission: RE | Admit: 2022-10-09 | Discharge: 2022-10-09 | Disposition: A | Discharge status: Home / Self Care | Payer: Medicare HMO | Source: Ambulatory Visit | Attending: Internal Medicine | Admitting: Internal Medicine

## 2022-10-09 DIAGNOSIS — R350 Frequency of micturition: Secondary | ICD-10-CM | POA: Insufficient documentation

## 2022-10-09 DIAGNOSIS — Z0389 Encounter for observation for other suspected diseases and conditions ruled out: Secondary | ICD-10-CM | POA: Diagnosis not present

## 2022-10-12 ENCOUNTER — Ambulatory Visit: Payer: Medicare HMO

## 2022-10-13 ENCOUNTER — Other Ambulatory Visit: Payer: Medicare HMO

## 2022-10-16 ENCOUNTER — Ambulatory Visit: Payer: Medicare HMO | Admitting: Oncology

## 2022-10-16 ENCOUNTER — Ambulatory Visit: Payer: Medicare HMO

## 2022-10-16 ENCOUNTER — Other Ambulatory Visit: Payer: Medicare HMO

## 2022-10-19 ENCOUNTER — Encounter: Payer: Self-pay | Admitting: Oncology

## 2022-10-19 ENCOUNTER — Inpatient Hospital Stay: Payer: Medicare HMO

## 2022-10-19 ENCOUNTER — Inpatient Hospital Stay: Payer: Medicare HMO | Attending: Oncology | Admitting: Oncology

## 2022-10-19 VITALS — BP 132/72 | HR 77 | Temp 96.6°F | Resp 18 | Ht 64.0 in | Wt 219.3 lb

## 2022-10-19 VITALS — BP 135/81 | HR 80

## 2022-10-19 DIAGNOSIS — Z79899 Other long term (current) drug therapy: Secondary | ICD-10-CM | POA: Insufficient documentation

## 2022-10-19 DIAGNOSIS — D509 Iron deficiency anemia, unspecified: Secondary | ICD-10-CM | POA: Diagnosis not present

## 2022-10-19 DIAGNOSIS — D508 Other iron deficiency anemias: Secondary | ICD-10-CM

## 2022-10-19 DIAGNOSIS — D72820 Lymphocytosis (symptomatic): Secondary | ICD-10-CM | POA: Diagnosis not present

## 2022-10-19 MED ORDER — SODIUM CHLORIDE 0.9 % IV SOLN
200.0000 mg | INTRAVENOUS | Status: DC
  Administered 2022-10-19: 200 mg via INTRAVENOUS
  Filled 2022-10-19: qty 200

## 2022-10-19 MED ORDER — SODIUM CHLORIDE 0.9 % IV SOLN
Freq: Once | INTRAVENOUS | Status: AC
  Filled 2022-10-19: qty 250

## 2022-10-19 NOTE — Patient Instructions (Signed)

## 2022-10-19 NOTE — Progress Notes (Signed)
No concerns for the provider today. 

## 2022-10-19 NOTE — Progress Notes (Signed)
Hematology/Oncology Consult note [facility]  [phone] [phone]  Patient Care Team: Crecencio Mc, MD as PCP - General (Internal Medicine) Kate Sable, MD as PCP - Cardiology (Cardiology) Sindy Guadeloupe, MD as Consulting Physician (Hematology and Oncology) Lin Landsman, MD as Consulting Physician (Gastroenterology)   Name of the patient: Cheryl Coleman  TO:[id number]  [birth year]   Date of visit: 10/19/22  Diagnosis-lymphocytosis likely reactive Iron deficiency anemia secondary to diverticular bleeding  Chief complaint/ Reason for visit-routine follow-up of lymphocytosis and iron deficiency anemia  Heme/Onc history: Patient is a 67 year old female with a past medical history significant for GERD hiatal hernia hypertension hyperlipidemia who has been referred to [place] for leukocytosis.  Looking back at her prior CBCs her white cell count typically fluctuates between 9.5-11.5.  More recently on 07/08/2021 her white count was 9.2 with an H&H of 13.1/29.2 and a platelet count of 247.  Her hemoglobin and platelet counts have always been normal.  Her differential has shown relative lymphocytosis with an absolute lymphocyte count that ranges between 4000-4800.  Patient denies any B symptoms presently   Results of blood work from 1/6/2023Showed white cell count of 10, H&H of 13.3/36.9 with an MCV of 77 and a platelet count of 255.  Flow cytometry showed polyclonal B-cell lymphocytosis.  Reactive process or biclonal lymphoproliferative disorder with nonspecific phenotype.  Absolute eosinophilia.    Interval history-patient had 2 episodes of diverticular bleeding in July and September 2023.  She has not had any GI bleeding since then.  She gets bowel issues with oral iron and therefore she has not been taking any oral iron.  ECOG PS- 0 Pain scale- 0  Review of systems- Review of Systems  Constitutional:  Negative for chills, fever,  malaise/fatigue and weight loss.  HENT:  Negative for congestion, ear discharge and nosebleeds.   Eyes:  Negative for blurred vision.  Respiratory:  Negative for cough, hemoptysis, sputum production, shortness of breath and wheezing.   Cardiovascular:  Negative for chest pain, palpitations, orthopnea and claudication.  Gastrointestinal:  Negative for abdominal pain, blood in stool, constipation, diarrhea, heartburn, melena, nausea and vomiting.  Genitourinary:  Negative for dysuria, flank pain, frequency, hematuria and urgency.  Musculoskeletal:  Negative for back pain, joint pain and myalgias.  Skin:  Negative for rash.  Neurological:  Negative for dizziness, tingling, focal weakness, seizures, weakness and headaches.  Endo/Heme/Allergies:  Does not bruise/bleed easily.  Psychiatric/Behavioral:  Negative for depression and suicidal ideas. The patient does not have insomnia.       Allergies  Allergen Reactions   Oxycodone Other (See Comments)    Feels bad     Past Medical History:  Diagnosis Date   Acute blood loss anemia    Arthritis    Asthma    COVID    COVID-19    07/24/20, 05/19/21   Diverticulitis    GERD (gastroesophageal reflux disease)    GIB (gastrointestinal bleeding)    diverticular 01/23/22-01/24/22 [facility] s/p emobolization & 15 years ago in [place]   Hiatal hernia    History of blood transfusion    History of chicken pox    History of GI bleed    2013/14   Hyperlipidemia    Hypertension    Hypovolemic shock 04/06/2022   Leukocytosis    Type O blood, Rh positive      Past Surgical History:  Procedure Laterality Date   BREAST CYST ASPIRATION Right    CESAREAN SECTION  04/27/84   COLONOSCOPY     COLONOSCOPY WITH PROPOFOL N/A 02/27/2022   Procedure: COLONOSCOPY WITH PROPOFOL;  Surgeon: Lin Landsman, MD;  Location: [facility] ENDOSCOPY;  Service: Gastroenterology;  Laterality: N/A;   EMBOLIZATION N/A 01/23/2022   Procedure: EMBOLIZATION;  Surgeon: Katha Cabal, MD;  Location: [place] [facility];  Service: Cardiovascular;  Laterality: N/A;   EMBOLIZATION N/A 04/06/2022   Procedure: EMBOLIZATION;  Surgeon: Algernon Huxley, MD;  Location: [place] [facility];  Service: Cardiovascular;  Laterality: N/A;   ESOPHAGOGASTRODUODENOSCOPY (EGD) WITH PROPOFOL N/A 08/29/2020   Procedure: ESOPHAGOGASTRODUODENOSCOPY (EGD) WITH PROPOFOL;  Surgeon: Lin Landsman, MD;  Location: [facility] ENDOSCOPY;  Service: Gastroenterology;  Laterality: N/A;   IR ANGIOGRAM VISCERAL SELECTIVE  04/05/2022   IR ANGIOGRAM VISCERAL SELECTIVE  04/05/2022   IR EMBO ART  VEN HEMORR LYMPH EXTRAV  INC GUIDE ROADMAPPING  04/05/2022   IR US GUIDE VASC ACCESS RIGHT  04/05/2022   ORIF ACETABULAR FRACTURE      Social History   Socioeconomic History   Marital status: Married    Spouse name: Not on file   Number of children: Not on file   Years of education: Not on file   Highest education level: Not on file  Occupational History   Not on file  Tobacco Use   Smoking status: Never   Smokeless tobacco: Never  Vaping Use   Vaping Use: Never used  Substance and Sexual Activity   Alcohol use: Not Currently   Drug use: Not Currently   Sexual activity: Yes    Comment: husband   Other Topics Concern   Not on file  Social History Narrative   Lived in [place] from [place]    Married 1st husband died in late 90s/2000s   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, wears seat belt, safe in relationship    Social Determinants of Health   Financial Resource Strain: Not on file  Food Insecurity: No Food Insecurity (04/06/2022)   Hunger Vital Sign    Worried About Running Out of Food in the Last Year: Never true    Ran Out of Food in the Last Year: Never true  Transportation Needs: No Transportation Needs (04/06/2022)   PRAPARE - Hydrologist (Medical): No    Lack of Transportation (Non-Medical): No  Physical Activity: Not on file  Stress: Not on file  Social Connections: Not  on file  Intimate Partner Violence: Not At Risk (04/06/2022)   Humiliation, Afraid, Rape, and Kick questionnaire    Fear of Current or Ex-Partner: No    Emotionally Abused: No    Physically Abused: No    Sexually Abused: No    Family History  Problem Relation Age of Onset   Hypertension Maternal Aunt    Diabetes Maternal Aunt    Breast cancer Neg Hx      Current Outpatient Medications:    albuterol (VENTOLIN HFA) 108 (90 Base) MCG/ACT inhaler, INHALE 1-2 PUFFS BY MOUTH EVERY 6 (SIX) HOURS AS NEEDED, Disp: 18 each, Rfl: 11   atorvastatin (LIPITOR) 10 MG tablet, Take 1 tablet (10 mg total) by mouth daily at 6 PM., Disp: 90 tablet, Rfl: 3   citalopram (CELEXA) 10 MG tablet, Take 1 tablet (10 mg total) by mouth daily., Disp: 90 tablet, Rfl: 3   diltiazem (CARDIZEM CD) 120 MG 24 hr capsule, Take 1 capsule (120 mg total) by mouth 2 (two) times daily.,  Disp: 180 capsule, Rfl: 3   fluticasone (FLONASE) 50 MCG/ACT nasal spray, SPRAY 2 SPRAYS INTO EACH NOSTRIL EVERY DAY (Patient taking differently: Place 2 sprays into both nostrils daily as needed for allergies or rhinitis.), Disp: 48 mL, Rfl: 3   losartan (COZAAR) 25 MG tablet, Take 1 tablet (25 mg total) by mouth daily., Disp: 90 tablet, Rfl: 0   montelukast (SINGULAIR) 10 MG tablet, Take 1 tablet (10 mg total) by mouth at bedtime. TAKE 1 TABLET BY MOUTH EVERYDAY AT BEDTIME Strength: 10 mg, Disp: 90 tablet, Rfl: 3   omeprazole (PRILOSEC) 40 MG capsule, Take 1 capsule (40 mg total) by mouth 2 (two) times daily., Disp: 180 capsule, Rfl: 3   Specialty Vitamins Products (WOMENS VITA PAK PO), Take by mouth daily., Disp: , Rfl:   Physical exam:  Vitals:   10/19/22 1259  BP: 132/72  Pulse: 77  Resp: 18  Temp: (!) 96.6 F (35.9 C)  TempSrc: Tympanic  SpO2: 100%  Weight: 219 lb 4.8 oz (99.5 kg)  Height: 5\' 4"  (1.626 m)   Physical Exam Cardiovascular:     Rate and Rhythm: Normal rate and regular rhythm.     Heart sounds: Normal heart sounds.   Pulmonary:     Effort: Pulmonary effort is normal.  Skin:    General: Skin is warm and dry.  Neurological:     Mental Status: She is alert and oriented to person, place, and time.         Latest Ref Rng & Units 09/29/2022   10:56 AM  CMP  Glucose 70 - 99 mg/dL 86   BUN 6 - 23 mg/dL 10   Creatinine 0.40 - 1.20 mg/dL 0.85   Sodium 135 - 145 mEq/L 139   Potassium 3.5 - 5.1 mEq/L 4.1   Chloride 96 - 112 mEq/L 102   CO2 19 - 32 mEq/L 29   Calcium 8.4 - 10.5 mg/dL 9.8   Total Protein 6.0 - 8.3 g/dL 7.9   Total Bilirubin 0.2 - 1.2 mg/dL 0.4   Alkaline Phos 39 - 117 U/L 63   AST 0 - 37 U/L 13   ALT 0 - 35 U/L 10       Latest Ref Rng & Units 09/29/2022   10:56 AM  CBC  WBC 4.0 - 10.5 K/uL 8.3   Hemoglobin 12.0 - 15.0 g/dL 12.5   Hematocrit 36.0 - 46.0 % 36.9   Platelets 150.0 - 400.0 K/uL 271.0     No images are attached to the encounter.  US PELVIS LIMITED (TRANSABDOMINAL ONLY)  Result Date: 10/09/2022 CLINICAL DATA:  Rule out urinary retention. EXAM: LIMITED ULTRASOUND OF PELVIS TECHNIQUE: Limited transabdominal ultrasound examination of the pelvis was performed. COMPARISON:  None Available. FINDINGS: The prevoid volume of the bladder is 86.43 cc. The patient urinated 41.89 cc with a postvoid volume of 44.5 cc. The bladder is otherwise unremarkable. IMPRESSION: The patient urinated 41.89 cc with a postvoid volume of 44.5 cc. Electronically Signed   By: Dorise Bullion III M.D.   On: 10/09/2022 19:35   MM 3D SCREEN BREAST BILATERAL  Result Date: 10/05/2022 CLINICAL DATA:  Screening. EXAM: DIGITAL SCREENING BILATERAL MAMMOGRAM WITH TOMOSYNTHESIS AND CAD TECHNIQUE: Bilateral screening digital craniocaudal and mediolateral oblique mammograms were obtained. Bilateral screening digital breast tomosynthesis was performed. The images were evaluated with computer-aided detection. COMPARISON:  Previous exam(s). ACR Breast Density Category b: There are scattered areas of fibroglandular  density. FINDINGS: There are no findings suspicious for malignancy.  IMPRESSION: No mammographic evidence of malignancy. A result letter of this screening mammogram will be mailed directly to the patient. RECOMMENDATION: Screening mammogram in one year. (Code:SM-B-01Y) BI-RADS CATEGORY  1: Negative. Electronically Signed   By: Audie Pinto M.D.   On: 10/05/2022 13:32     Assessment and plan- Patient is a 67 y.o. female Here for routine follow-up of lymphocytosis and iron deficiency anemia  Patient's white cell count is normal presently at 8.3 with a normal differential.  Iron deficiency anemia: Patient's hemoglobin had dropped down to 8.4 in September 2023 when she had diverticular bleeding.  Presently her hemoglobin is 12.5.  However iron studies are still indicated of iron deficiency with a ferritin level of 10 and iron saturation of 13%.  Discussed risks and benefits of IV iron including all but not limited to possible risk of infusion reaction.  Patient understands and agrees to proceed as planned.  She will either receive 200 mg x 5 or 300 mg x 3 of IV Venofer.  CBC ferritin and iron studies in 3 and 6 months and I will see her back in 6 months   Visit Diagnosis 1. Iron deficiency anemia, unspecified iron deficiency anemia type   2. Lymphocytosis      Dr. Randa Evens, MD, MPH [facility] at [facility] [id number] 10/19/2022 1:25 PM

## 2022-10-20 ENCOUNTER — Ambulatory Visit (INDEPENDENT_AMBULATORY_CARE_PROVIDER_SITE_OTHER): Payer: Medicare HMO

## 2022-10-20 DIAGNOSIS — I10 Essential (primary) hypertension: Secondary | ICD-10-CM

## 2022-10-20 NOTE — Progress Notes (Addendum)
BP-140/78  SpO2- 97%  Pulse-82 Patient states she takes her medications and checks her BP at home. Readings from home range from 126/78 to around 138/84  I have reviewed the above information and agree with above.  No changes advised    Duncan Dull, MD

## 2022-10-21 LAB — BASIC METABOLIC PANEL WITH GFR
BUN: 9 mg/dL (ref 6–23)
CO2: 28 meq/L (ref 19–32)
Calcium: 9.3 mg/dL (ref 8.4–10.5)
Chloride: 103 meq/L (ref 96–112)
Creatinine, Ser: 0.87 mg/dL (ref 0.40–1.20)
GFR: 68.55 mL/min
Glucose, Bld: 87 mg/dL (ref 70–99)
Potassium: 3.9 meq/L (ref 3.5–5.1)
Sodium: 137 meq/L (ref 135–145)

## 2022-10-27 ENCOUNTER — Inpatient Hospital Stay: Payer: Medicare HMO

## 2022-10-27 VITALS — BP 133/77 | HR 76 | Temp 96.6°F | Resp 18

## 2022-10-27 DIAGNOSIS — Z79899 Other long term (current) drug therapy: Secondary | ICD-10-CM | POA: Diagnosis not present

## 2022-10-27 DIAGNOSIS — D508 Other iron deficiency anemias: Secondary | ICD-10-CM

## 2022-10-27 DIAGNOSIS — D72820 Lymphocytosis (symptomatic): Secondary | ICD-10-CM | POA: Diagnosis not present

## 2022-10-27 DIAGNOSIS — D509 Iron deficiency anemia, unspecified: Secondary | ICD-10-CM | POA: Diagnosis not present

## 2022-10-27 MED ORDER — SODIUM CHLORIDE 0.9 % IV SOLN
200.0000 mg | INTRAVENOUS | Status: DC
  Administered 2022-10-27: 200 mg via INTRAVENOUS
  Filled 2022-10-27: qty 200

## 2022-10-27 MED ORDER — SODIUM CHLORIDE 0.9 % IV SOLN
Freq: Once | INTRAVENOUS | Status: AC
  Filled 2022-10-27: qty 250

## 2022-10-28 DIAGNOSIS — H43812 Vitreous degeneration, left eye: Secondary | ICD-10-CM | POA: Diagnosis not present

## 2022-10-28 DIAGNOSIS — H2513 Age-related nuclear cataract, bilateral: Secondary | ICD-10-CM | POA: Diagnosis not present

## 2022-10-28 DIAGNOSIS — H43392 Other vitreous opacities, left eye: Secondary | ICD-10-CM | POA: Diagnosis not present

## 2022-10-28 MED FILL — Iron Sucrose Inj 20 MG/ML (Fe Equiv): INTRAVENOUS | Qty: 10 | Status: AC

## 2022-10-29 ENCOUNTER — Inpatient Hospital Stay: Payer: Medicare HMO

## 2022-10-29 VITALS — BP 116/76 | HR 75 | Temp 97.7°F | Resp 18

## 2022-10-29 DIAGNOSIS — Z79899 Other long term (current) drug therapy: Secondary | ICD-10-CM | POA: Diagnosis not present

## 2022-10-29 DIAGNOSIS — D72820 Lymphocytosis (symptomatic): Secondary | ICD-10-CM | POA: Diagnosis not present

## 2022-10-29 DIAGNOSIS — D508 Other iron deficiency anemias: Secondary | ICD-10-CM

## 2022-10-29 DIAGNOSIS — D509 Iron deficiency anemia, unspecified: Secondary | ICD-10-CM | POA: Diagnosis not present

## 2022-10-29 MED ORDER — SODIUM CHLORIDE 0.9 % IV SOLN
200.0000 mg | INTRAVENOUS | Status: DC
  Administered 2022-10-29: 200 mg via INTRAVENOUS
  Filled 2022-10-29: qty 200

## 2022-10-29 MED ORDER — SODIUM CHLORIDE 0.9 % IV SOLN
Freq: Once | INTRAVENOUS | Status: AC
  Filled 2022-10-29: qty 250

## 2022-10-30 MED FILL — Iron Sucrose Inj 20 MG/ML (Fe Equiv): INTRAVENOUS | Qty: 10 | Status: AC

## 2022-11-02 ENCOUNTER — Inpatient Hospital Stay: Payer: Medicare HMO

## 2022-11-02 VITALS — BP 113/65 | HR 89 | Temp 98.4°F | Resp 18

## 2022-11-02 DIAGNOSIS — Z79899 Other long term (current) drug therapy: Secondary | ICD-10-CM | POA: Diagnosis not present

## 2022-11-02 DIAGNOSIS — D72820 Lymphocytosis (symptomatic): Secondary | ICD-10-CM | POA: Diagnosis not present

## 2022-11-02 DIAGNOSIS — D509 Iron deficiency anemia, unspecified: Secondary | ICD-10-CM | POA: Diagnosis not present

## 2022-11-02 DIAGNOSIS — D508 Other iron deficiency anemias: Secondary | ICD-10-CM

## 2022-11-02 MED ORDER — SODIUM CHLORIDE 0.9 % IV SOLN
200.0000 mg | INTRAVENOUS | Status: DC
  Administered 2022-11-02: 200 mg via INTRAVENOUS
  Filled 2022-11-02: qty 200

## 2022-11-02 MED ORDER — SODIUM CHLORIDE 0.9 % IV SOLN
Freq: Once | INTRAVENOUS | Status: AC
  Filled 2022-11-02: qty 250

## 2022-11-04 ENCOUNTER — Inpatient Hospital Stay: Payer: Medicare HMO

## 2022-11-04 VITALS — BP 129/65 | HR 82 | Temp 98.8°F | Resp 16

## 2022-11-04 DIAGNOSIS — D72820 Lymphocytosis (symptomatic): Secondary | ICD-10-CM | POA: Diagnosis not present

## 2022-11-04 DIAGNOSIS — D509 Iron deficiency anemia, unspecified: Secondary | ICD-10-CM | POA: Diagnosis not present

## 2022-11-04 DIAGNOSIS — D508 Other iron deficiency anemias: Secondary | ICD-10-CM

## 2022-11-04 DIAGNOSIS — Z79899 Other long term (current) drug therapy: Secondary | ICD-10-CM | POA: Diagnosis not present

## 2022-11-04 MED ORDER — SODIUM CHLORIDE 0.9 % IV SOLN
200.0000 mg | INTRAVENOUS | Status: DC
  Administered 2022-11-04: 200 mg via INTRAVENOUS
  Filled 2022-11-04: qty 200

## 2022-11-04 MED ORDER — SODIUM CHLORIDE 0.9 % IV SOLN
Freq: Once | INTRAVENOUS | Status: AC
  Filled 2022-11-04: qty 250

## 2022-11-04 NOTE — Progress Notes (Signed)
Patient declined staying for the full 30 minute observation period. Vital signs stable at discharge. Patient informed to go to ED for any signs of allergic reaction.

## 2022-12-26 ENCOUNTER — Other Ambulatory Visit: Payer: Self-pay | Admitting: Internal Medicine

## 2022-12-26 DIAGNOSIS — I10 Essential (primary) hypertension: Secondary | ICD-10-CM

## 2022-12-28 ENCOUNTER — Telehealth: Payer: Self-pay | Admitting: Internal Medicine

## 2022-12-28 DIAGNOSIS — J4520 Mild intermittent asthma, uncomplicated: Secondary | ICD-10-CM

## 2022-12-28 MED ORDER — MONTELUKAST SODIUM 10 MG PO TABS: 10.0000 mg | ORAL_TABLET | Freq: Every day | ORAL | 3 refills | Status: DC

## 2022-12-28 NOTE — Telephone Encounter (Signed)
Prescription Request  12/28/2022  LOV: 09/29/2022  What is the name of the medication or equipment? montelukast   Have you contacted your pharmacy to request a refill? Yes   Which pharmacy would you like this sent to? [facility] in [place] Patient notified that their request is being sent to the clinical staff for review and that they should receive a response within 2 business days.   Please advise at Mobile [phone] (mobile)

## 2022-12-28 NOTE — Telephone Encounter (Signed)
Medication has been refilled.

## 2023-01-15 ENCOUNTER — Other Ambulatory Visit: Payer: Self-pay

## 2023-01-15 DIAGNOSIS — D509 Iron deficiency anemia, unspecified: Secondary | ICD-10-CM

## 2023-01-18 ENCOUNTER — Inpatient Hospital Stay: Payer: Medicare HMO | Attending: Oncology

## 2023-01-18 DIAGNOSIS — D509 Iron deficiency anemia, unspecified: Secondary | ICD-10-CM | POA: Insufficient documentation

## 2023-01-18 DIAGNOSIS — D72820 Lymphocytosis (symptomatic): Secondary | ICD-10-CM | POA: Insufficient documentation

## 2023-01-18 LAB — CBC WITH DIFFERENTIAL (CANCER CENTER ONLY)
Abs Immature Granulocytes: 0.02 K/uL (ref 0.00–0.07)
Basophils Absolute: 0.1 K/uL (ref 0.0–0.1)
Basophils Relative: 1 %
Eosinophils Absolute: 0.2 K/uL (ref 0.0–0.5)
Eosinophils Relative: 2 %
HCT: 38.5 % (ref 36.0–46.0)
Hemoglobin: 13.3 g/dL (ref 12.0–15.0)
Immature Granulocytes: 0 %
Lymphocytes Relative: 41 %
Lymphs Abs: 4.1 K/uL — ABNORMAL HIGH (ref 0.7–4.0)
MCH: 26.8 pg (ref 26.0–34.0)
MCHC: 34.5 g/dL (ref 30.0–36.0)
MCV: 77.5 fL — ABNORMAL LOW (ref 80.0–100.0)
Monocytes Absolute: 0.7 K/uL (ref 0.1–1.0)
Monocytes Relative: 7 %
Neutro Abs: 4.9 K/uL (ref 1.7–7.7)
Neutrophils Relative %: 49 %
Platelet Count: 250 K/uL (ref 150–400)
RBC: 4.97 MIL/uL (ref 3.87–5.11)
RDW: 16.5 % — ABNORMAL HIGH (ref 11.5–15.5)
WBC Count: 10 K/uL (ref 4.0–10.5)
nRBC: 0 % (ref 0.0–0.2)

## 2023-01-18 LAB — IRON AND TIBC
Iron: 101 ug/dL (ref 28–170)
Saturation Ratios: 31 % (ref 10.4–31.8)
TIBC: 329 ug/dL (ref 250–450)
UIBC: 228 ug/dL

## 2023-01-18 LAB — FERRITIN: Ferritin: 149 ng/mL (ref 11–307)

## 2023-01-20 ENCOUNTER — Ambulatory Visit (INDEPENDENT_AMBULATORY_CARE_PROVIDER_SITE_OTHER): Payer: Medicare HMO | Admitting: Family Medicine

## 2023-01-20 ENCOUNTER — Encounter: Payer: Self-pay | Admitting: Family Medicine

## 2023-01-20 VITALS — BP 138/86 | HR 94 | Temp 98.1°F | Ht 64.0 in | Wt 221.0 lb

## 2023-01-20 DIAGNOSIS — R350 Frequency of micturition: Secondary | ICD-10-CM | POA: Diagnosis not present

## 2023-01-20 DIAGNOSIS — N390 Urinary tract infection, site not specified: Secondary | ICD-10-CM | POA: Insufficient documentation

## 2023-01-20 HISTORY — DX: Urinary tract infection, site not specified: N39.0

## 2023-01-20 LAB — POC URINALSYSI DIPSTICK (AUTOMATED)
Bilirubin, UA: NEGATIVE
Glucose, UA: NEGATIVE
Ketones, UA: NEGATIVE
Nitrite, UA: NEGATIVE
Protein, UA: POSITIVE — AB
Spec Grav, UA: 1.01
Urobilinogen, UA: 0.2 U/dL
pH, UA: 6.5

## 2023-01-20 LAB — URINALYSIS, MICROSCOPIC ONLY

## 2023-01-20 MED ORDER — NITROFURANTOIN MONOHYD MACRO 100 MG PO CAPS: 100.0000 mg | ORAL_CAPSULE | Freq: Two times a day (BID) | ORAL | 0 refills | Status: DC

## 2023-01-20 NOTE — Patient Instructions (Signed)
Nice to meet you. We will treat you with Macrobid for UTI. If your symptoms or not improving over the next day or 2 please let us know. If you develop any fevers or any worsening symptoms please get reevaluated.

## 2023-01-20 NOTE — Progress Notes (Signed)
  Marikay Alar, MD Phone: [phone]  Lakera Josselyn is a 67 y.o. female who presents today for same day visit.   UTI: onset of symptoms yesterday Dysuria- yes  Frequency- yes   Urgency- yes   Hematuria- no   Fever- no  Abd pain- suprapubic   Vaginal d/c- no   Social History   Tobacco Use  Smoking Status Never  Smokeless Tobacco Never    Current Outpatient Medications on File Prior to Visit  Medication Sig Dispense Refill   albuterol (VENTOLIN HFA) 108 (90 Base) MCG/ACT inhaler INHALE 1-2 PUFFS BY MOUTH EVERY 6 (SIX) HOURS AS NEEDED 18 each 11   atorvastatin (LIPITOR) 10 MG tablet Take 1 tablet (10 mg total) by mouth daily at 6 PM. 90 tablet 3   citalopram (CELEXA) 10 MG tablet Take 1 tablet (10 mg total) by mouth daily. 90 tablet 3   diltiazem (CARDIZEM CD) 120 MG 24 hr capsule Take 1 capsule (120 mg total) by mouth 2 (two) times daily. 180 capsule 3   fluticasone (FLONASE) 50 MCG/ACT nasal spray SPRAY 2 SPRAYS INTO EACH NOSTRIL EVERY DAY (Patient taking differently: Place 2 sprays into both nostrils daily as needed for allergies or rhinitis.) 48 mL 3   losartan (COZAAR) 25 MG tablet TAKE 1 TABLET (25 MG TOTAL) BY MOUTH DAILY. 90 tablet 1   montelukast (SINGULAIR) 10 MG tablet Take 1 tablet (10 mg total) by mouth at bedtime. 90 tablet 3   omeprazole (PRILOSEC) 40 MG capsule Take 1 capsule (40 mg total) by mouth 2 (two) times daily. 180 capsule 3   Specialty Vitamins Products (WOMENS VITA PAK PO) Take by mouth daily.     No current facility-administered medications on file prior to visit.     ROS see history of present illness  Objective  Physical Exam Vitals:   01/20/23 1339  BP: 138/86  Pulse: 94  Temp: 98.1 F (36.7 C)  SpO2: 97%    BP Readings from Last 3 Encounters:  01/20/23 138/86  11/04/22 129/65  11/02/22 113/65   Wt Readings from Last 3 Encounters:  01/20/23 221 lb (100.2 kg)  10/19/22 219 lb 4.8 oz (99.5 kg)  09/29/22 215 lb (97.5 kg)     Physical Exam Constitutional:      General: She is not in acute distress. Abdominal:     General: Bowel sounds are normal. There is no distension.     Palpations: Abdomen is soft.     Tenderness: There is no abdominal tenderness.  Neurological:     Mental Status: She is alert.      Assessment/Plan: Please see individual problem list.  Urine frequency Assessment & Plan: Acute issue.  Likely related to UTI based on symptoms.  Will treat with Macrobid 100 mg twice daily.  Urine to be sent for culture and microscopy.  If not improving over the next couple of days she will let us know.  If any worsening symptoms she will seek medical attention.  Orders: -     POCT Urinalysis Dipstick (Automated) -     Nitrofurantoin Monohyd Macro; Take 1 capsule (100 mg total) by mouth 2 (two) times daily.  Dispense: 10 capsule; Refill: 0 -     Urine Culture -     Urine Microscopic     Return if symptoms worsen or fail to improve.   Marikay Alar, MD [facility]

## 2023-01-20 NOTE — Assessment & Plan Note (Addendum)
Acute issue.  Likely related to UTI based on symptoms.  Will treat with Macrobid 100 mg twice daily.  Urine to be sent for culture and microscopy.  If not improving over the next couple of days she will let us know.  If any worsening symptoms she will seek medical attention.

## 2023-01-23 LAB — URINE CULTURE
MICRO NUMBER:: 15158982
SPECIMEN QUALITY:: ADEQUATE

## 2023-01-25 ENCOUNTER — Ambulatory Visit (INDEPENDENT_AMBULATORY_CARE_PROVIDER_SITE_OTHER): Payer: Medicare HMO | Admitting: Internal Medicine

## 2023-01-25 ENCOUNTER — Encounter: Payer: Self-pay | Admitting: Internal Medicine

## 2023-01-25 VITALS — BP 110/70 | HR 90 | Temp 98.0°F | Resp 16 | Ht 64.0 in | Wt 222.0 lb

## 2023-01-25 DIAGNOSIS — R600 Localized edema: Secondary | ICD-10-CM | POA: Diagnosis not present

## 2023-01-25 DIAGNOSIS — H60543 Acute eczematoid otitis externa, bilateral: Secondary | ICD-10-CM | POA: Insufficient documentation

## 2023-01-25 DIAGNOSIS — I872 Venous insufficiency (chronic) (peripheral): Secondary | ICD-10-CM | POA: Diagnosis not present

## 2023-01-25 DIAGNOSIS — J4520 Mild intermittent asthma, uncomplicated: Secondary | ICD-10-CM

## 2023-01-25 DIAGNOSIS — B9689 Other specified bacterial agents as the cause of diseases classified elsewhere: Secondary | ICD-10-CM | POA: Diagnosis not present

## 2023-01-25 DIAGNOSIS — H6691 Otitis media, unspecified, right ear: Secondary | ICD-10-CM | POA: Insufficient documentation

## 2023-01-25 DIAGNOSIS — I10 Essential (primary) hypertension: Secondary | ICD-10-CM | POA: Diagnosis not present

## 2023-01-25 DIAGNOSIS — N390 Urinary tract infection, site not specified: Secondary | ICD-10-CM

## 2023-01-25 HISTORY — DX: Otitis media, unspecified, right ear: H66.91

## 2023-01-25 MED ORDER — MOMETASONE FUROATE 0.1 % EX CREA: TOPICAL_CREAM | CUTANEOUS | 1 refills | Status: DC

## 2023-01-25 MED ORDER — AMOXICILLIN-POT CLAVULANATE 875-125 MG PO TABS: 1.0000 | ORAL_TABLET | Freq: Two times a day (BID) | ORAL | 0 refills | Status: DC

## 2023-01-25 NOTE — Assessment & Plan Note (Signed)
Did not resolve  with macrobid .  Augmentin prescribed

## 2023-01-25 NOTE — Assessment & Plan Note (Signed)
Well controlled on current regimen. Renal function stable, no changes today. 

## 2023-01-25 NOTE — Progress Notes (Signed)
Subjective:  Patient ID: Cheryl Coleman, female    DOB: 05/13/1956  Age: 67 y.o. MRN: [id number]  CC: The primary encounter diagnosis was Mild intermittent asthma without complication. Diagnoses of Essential hypertension, Right otitis media, unspecified otitis media type, UTI due to Klebsiella species, Edema of right lower leg due to venous stasis, and Eczema of external ear, bilateral were also pertinent to this visit.   HPI Cheryl Coleman presents for  Chief Complaint  Patient presents with   Medical Management of Chronic Issues   1) Rhinitis and productive cough accompanied by fatigue since Friday .  No fevers ,  COVID negative home test.   new onset aching  in Right ear yesterday , intermittent .  Sinus congestion and "weird headaches" that are intermittent,  actually currently resolved   2) Treated last week for UTI  but symptoms have returned.  Culture reviewed,  Klebsiella  intermediate sensitivity to Macrobid,  sensitive to Augmentin and Levaquin.   3) h/o fracture to right tib fib remotely,  has a pin in it,  recently noted swelling of the ankle and lower leg.  No calf pain or recent trauma.  No prolonged immobilization,  recent surgery or CA .  Has lots of spider veins.    Does not exercise regularly   4) Bilateral itching of ear canal   Outpatient Medications Prior to Visit  Medication Sig Dispense Refill   albuterol (VENTOLIN HFA) 108 (90 Base) MCG/ACT inhaler INHALE 1-2 PUFFS BY MOUTH EVERY 6 (SIX) HOURS AS NEEDED 18 each 11   atorvastatin (LIPITOR) 10 MG tablet Take 1 tablet (10 mg total) by mouth daily at 6 PM. 90 tablet 3   citalopram (CELEXA) 10 MG tablet Take 1 tablet (10 mg total) by mouth daily. 90 tablet 3   diltiazem (CARDIZEM CD) 120 MG 24 hr capsule Take 1 capsule (120 mg total) by mouth 2 (two) times daily. 180 capsule 3   fluticasone (FLONASE) 50 MCG/ACT nasal spray SPRAY 2 SPRAYS INTO EACH NOSTRIL EVERY DAY (Patient taking differently: Place 2 sprays into both nostrils  daily as needed for allergies or rhinitis.) 48 mL 3   losartan (COZAAR) 25 MG tablet TAKE 1 TABLET (25 MG TOTAL) BY MOUTH DAILY. 90 tablet 1   montelukast (SINGULAIR) 10 MG tablet Take 1 tablet (10 mg total) by mouth at bedtime. 90 tablet 3   omeprazole (PRILOSEC) 40 MG capsule Take 1 capsule (40 mg total) by mouth 2 (two) times daily. 180 capsule 3   Specialty Vitamins Products (WOMENS VITA PAK PO) Take by mouth daily.     nitrofurantoin, macrocrystal-monohydrate, (MACROBID) 100 MG capsule Take 1 capsule (100 mg total) by mouth 2 (two) times daily. 10 capsule 0   No facility-administered medications prior to visit.    Review of Systems;  Patient denies fevers, malaise, unintentional weight loss, skin rash, eye pain, sinus pain, dysphagia,  hemoptysis , cough, dyspnea, wheezing, chest pain, palpitations, orthopnea, edema, abdominal pain, nausea, melena, diarrhea, constipation, flank pain, dysuria, hematuria, urinary  Frequency, nocturia, numbness, tingling, seizures,  Focal weakness, Loss of consciousness,  Tremor, insomnia, depression, anxiety, and suicidal ideation.      Objective:  BP 110/70   Pulse 90   Temp 98 F (36.7 C)   Resp 16   Ht 5\' 4"  (1.626 m)   Wt 222 lb (100.7 kg)   SpO2 98%   BMI 38.11 kg/m   BP Readings from Last 3 Encounters:  01/25/23 110/70  01/20/23 138/86  11/04/22 129/65    Wt Readings from Last 3 Encounters:  01/25/23 222 lb (100.7 kg)  01/20/23 221 lb (100.2 kg)  10/19/22 219 lb 4.8 oz (99.5 kg)    Physical Exam Vitals reviewed.  Constitutional:      General: She is not in acute distress.    Appearance: Normal appearance. She is normal weight. She is not ill-appearing, toxic-appearing or diaphoretic.  HENT:     Head: Normocephalic.     Right Ear: Tympanic membrane is erythematous.     Left Ear: Tympanic membrane is not erythematous.  Eyes:     General: No scleral icterus.       Right eye: No discharge.        Left eye: No discharge.      Conjunctiva/sclera: Conjunctivae normal.  Cardiovascular:     Rate and Rhythm: Normal rate and regular rhythm.     Heart sounds: Normal heart sounds.  Pulmonary:     Effort: Pulmonary effort is normal. No respiratory distress.     Breath sounds: Normal breath sounds.  Musculoskeletal:        General: Normal range of motion.  Lymphadenopathy:     Cervical: No cervical adenopathy.  Skin:    General: Skin is warm and dry.  Neurological:     General: No focal deficit present.     Mental Status: She is alert and oriented to person, place, and time. Mental status is at baseline.  Psychiatric:        Mood and Affect: Mood normal.        Behavior: Behavior normal.        Thought Content: Thought content normal.        Judgment: Judgment normal.    Lab Results  Component Value Date   HGBA1C 5.9 09/29/2022   HGBA1C 6.4 10/10/2021   HGBA1C 6.0 07/08/2021    Lab Results  Component Value Date   CREATININE 0.87 10/20/2022   CREATININE 0.85 09/29/2022   CREATININE 0.83 04/09/2022    Lab Results  Component Value Date   WBC 10.0 01/18/2023   HGB 13.3 01/18/2023   HCT 38.5 01/18/2023   PLT 250 01/18/2023   GLUCOSE 87 10/20/2022   CHOL 174 09/29/2022   TRIG 67.0 09/29/2022   HDL 71.60 09/29/2022   LDLDIRECT 86.0 09/29/2022   LDLCALC 89 09/29/2022   ALT 10 09/29/2022   AST 13 09/29/2022   NA 137 10/20/2022   K 3.9 10/20/2022   CL 103 10/20/2022   CREATININE 0.87 10/20/2022   BUN 9 10/20/2022   CO2 28 10/20/2022   TSH 1.05 09/29/2022   HGBA1C 5.9 09/29/2022   MICROALBUR 8.1 (H) 09/29/2022    US PELVIS LIMITED (TRANSABDOMINAL ONLY)  Result Date: 10/09/2022 CLINICAL DATA:  Rule out urinary retention. EXAM: LIMITED ULTRASOUND OF PELVIS TECHNIQUE: Limited transabdominal ultrasound examination of the pelvis was performed. COMPARISON:  None Available. FINDINGS: The prevoid volume of the bladder is 86.43 cc. The patient urinated 41.89 cc with a postvoid volume of 44.5 cc. The  bladder is otherwise unremarkable. IMPRESSION: The patient urinated 41.89 cc with a postvoid volume of 44.5 cc. Electronically Signed   By: Gerome Sam III M.D.   On: 10/09/2022 19:35    Assessment & Plan:  .Mild intermittent asthma without complication Assessment & Plan: She is not wheezing on exam.  No indication for steroids   Essential hypertension Assessment & Plan: Well controlled on current regimen. Renal function stable, no changes today.  Right otitis media, unspecified otitis media type Assessment & Plan: Given chronicity of symptoms, development of right  ear pain and  exam consistent with bacterial otitis ,  Will treat with empiric antibiotics, decongestants,  . probiotic advised      UTI due to Klebsiella species Assessment & Plan: Did not resolve  with macrobid .  Augmentin prescribed   Edema of right lower leg due to venous stasis Assessment & Plan: Supportive care outlined   Eczema of external ear, bilateral Assessment & Plan: Mometasone cream prescribed for twice daily use    Other orders -     Mometasone Furoate; Apply two times daily to ear canal as needed for itching  Dispense: 15 g; Refill: 1 -     Amoxicillin-Pot Clavulanate; Take 1 tablet by mouth 2 (two) times daily.  Dispense: 14 tablet; Refill: 0    Follow-up: No follow-ups on file.   [name], MD

## 2023-01-25 NOTE — Assessment & Plan Note (Signed)
Mometasone cream prescribed for twice daily use

## 2023-01-25 NOTE — Assessment & Plan Note (Signed)
Given chronicity of symptoms, development of right  ear pain and  exam consistent with bacterial otitis ,  Will treat with empiric antibiotics, decongestants,  . probiotic advised

## 2023-01-25 NOTE — Patient Instructions (Signed)
The swelling in your right leg is transient and due to venous insufficiency . It does not require medication,  but elevating your legs and walking more will help.   am treating you for a UTI and otitis with augmentin .  I also advise continued use of the following OTC meds to help with your other symptoms.   Delsym or Robitussin DM   FOR THE  COUGH. Mucinex D for the congestion    Please take a probiotic ( Align, Flora que or Culturelle) OR A GENERIC EQUIVALENT  (or eat a serving of yogurt daily ) for three weeks since you are taking an  antibiotic to prevent a very serious antibiotic associated infection  Called clostridium dificile colitis that can cause diarrhea, multi organ failure, sepsis and death if not managed.

## 2023-01-25 NOTE — Assessment & Plan Note (Signed)
She is not wheezing on exam.  No indication for steroids

## 2023-01-25 NOTE — Assessment & Plan Note (Signed)
Supportive care outlined 

## 2023-01-28 ENCOUNTER — Telehealth: Payer: Self-pay | Admitting: Internal Medicine

## 2023-01-28 ENCOUNTER — Encounter: Payer: Self-pay | Admitting: Internal Medicine

## 2023-01-28 DIAGNOSIS — J4520 Mild intermittent asthma, uncomplicated: Secondary | ICD-10-CM

## 2023-01-28 NOTE — Telephone Encounter (Signed)
Called Patient and read message to her and the whole time she was talking to someone in the back ground then said bye. I said would you like to schedule an appointment or are you getting better? She states schedule an appointment so I am looking at the schedule and trying to schedule and she says you can call me back and hung up.

## 2023-01-28 NOTE — Telephone Encounter (Signed)
Pt called stating the Augmentin is not working and she would like Zithromax instead

## 2023-02-01 ENCOUNTER — Other Ambulatory Visit: Payer: Self-pay | Admitting: Podiatry

## 2023-02-01 ENCOUNTER — Ambulatory Visit: Payer: Self-pay

## 2023-02-02 MED ORDER — ALBUTEROL SULFATE HFA 108 (90 BASE) MCG/ACT IN AERS: INHALATION_SPRAY | RESPIRATORY_TRACT | 11 refills | Status: DC

## 2023-02-04 ENCOUNTER — Ambulatory Visit: Payer: Medicare HMO

## 2023-02-04 ENCOUNTER — Telehealth: Payer: Self-pay

## 2023-02-10 NOTE — Telephone Encounter (Signed)
error 

## 2023-02-18 ENCOUNTER — Telehealth: Payer: Self-pay | Admitting: Internal Medicine

## 2023-02-18 NOTE — Telephone Encounter (Signed)
Prescription Request  02/18/2023  LOV: 01/25/2023  What is the name of the medication or equipment? Nystatin  Have you contacted your pharmacy to request a refill? Yes   Which pharmacy would you like this sent to?  [id number], [place] - [address] [place] [address] Phone: [phone] Fax: [phone]     Patient notified that their request is being sent to the clinical staff for review and that they should receive a response within 2 business days.   Please advise at Mobile [phone] (mobile)

## 2023-02-22 NOTE — Telephone Encounter (Signed)
LMTCB. Need to find out if pt is needing the nystatin powder or the cream.

## 2023-02-23 ENCOUNTER — Other Ambulatory Visit: Payer: Self-pay | Admitting: Internal Medicine

## 2023-02-23 DIAGNOSIS — B370 Candidal stomatitis: Secondary | ICD-10-CM

## 2023-02-24 ENCOUNTER — Encounter: Payer: Self-pay | Admitting: Internal Medicine

## 2023-02-25 ENCOUNTER — Other Ambulatory Visit: Payer: Self-pay

## 2023-02-25 ENCOUNTER — Ambulatory Visit: Payer: Medicare HMO | Admitting: Gastroenterology

## 2023-02-25 ENCOUNTER — Encounter: Payer: Self-pay | Admitting: Oncology

## 2023-02-25 ENCOUNTER — Encounter: Payer: Self-pay | Admitting: Gastroenterology

## 2023-02-25 VITALS — BP 127/74 | HR 83 | Temp 97.9°F | Ht 64.0 in | Wt 221.0 lb

## 2023-02-25 DIAGNOSIS — K219 Gastro-esophageal reflux disease without esophagitis: Secondary | ICD-10-CM | POA: Diagnosis not present

## 2023-02-25 MED ORDER — NYSTATIN 100000 UNIT/ML MT SUSP: OROMUCOSAL | 0 refills | Status: DC

## 2023-02-25 NOTE — Progress Notes (Signed)
Cheryl Repress, MD [address]  [address]  [place], [place] [address]  Main: [phone]  Fax: [phone]    Gastroenterology Consultation  Referring Provider:     [name], MD Primary Care Physician:  Cheryl Shams, MD Primary Gastroenterologist:  Dr. Arlyss Coleman Reason for Consultation: Chronic GERD        HPI:   Cheryl Coleman is a 67 y.o. female referred by Dr. [name], MD  for consultation & management of GERD, chronic constipation  GERD: Patient has history of chronic GERD, underwent several EGDs in the past which did not reveal erosive esophagitis, showed intraepithelial lymphocytes on biopsies. Patient reports having heartburn associated with regurgitation for several months, including nocturnal symptoms.  Currently taking omeprazole 40 mg once a day which provides partial relief only.  She also reports localized pain in her epigastric area.  Patient has gained about 25 pounds within last 1 year and patient does not feel her normal self because she was not this heavy before.  She is planning to go on a [employer]ystem diet. She consumes rice and steak regularly She moved in later part of 2019 from [place] to [place].  Since then with the onset of pandemic, she has not been able to go out nor visit her children. Has been working from home.  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  She has been going back and forth between [place] and [place] due to some personal issues.  She found her life to be very stressful staying home, and discontinue place within last 1 year.  Patient is originally from [place]  Chronic constipation: This has also started within last 1 year, has gotten worse due to decreased physical activity and lack of fiber in her diet.  She takes MiraLAX as needed only. She underwent colonoscopy in 67 which revealed superficial ulceration in the terminal ileum,  biopsies revealed chronic inflammation.  Patient reported that she had a repeat colonoscopy in 2019, report is not available with me today.  Patient denies abdominal pain, rectal bleeding, nausea or vomiting  She does not smoke or drink alcohol She denies family history of GI malignancy  Follow-up visit 68/07/2020 Patient is here to discuss about her upper GI symptoms.  She reports that about a month ago, she did not eat all day and was having dinner, and she was hungry, trying to eat fast, felt like the food got stuck in the substernal area, followed by syncope.  She went to ER, she was told that she had vasovagal attack.  Since then, patient reports that every time she has dinner, she has been extra careful and little anxious, chews thoroughly.  She has been on omeprazole 40 mg twice daily for about a year and she thinks it is not helping.  She does report feeling gassy.  She reports that her bowel movements are fairly regular.  She has been in [place] most of the year in 2021, taking care of her granddaughter and therefore canceled her upper endoscopy.  She had history of Candida esophagitis  Follow-up visit 68/27/2023 Patient was recently admitted to [facility] on 7/7 secondary to several episodes of rectal bleeding, CT angio was positive for diverticular bleed in the distal descending and proximal sigmoid colon, s/p embolization.  Patient was found to have drop in hemoglobin from 13.3 to 11.0.  Patient reports that since discharge,  she has been having brown bowel movements.  She started taking Metamucil along with MiraLAX and noticed hot sensation in her lower abdomen that radiates to her upper abdomen and she is worried about this feeling.  She stopped taking MiraLAX, currently taking Metamucil only.  She has not lost any weight.  She denies any further episodes of rectal bleeding.  She denies any constipation, sensation of incomplete emptying  Follow-up visit 68/02/2023 Patient is here for follow-up of  chronic GERD symptoms.  She has nocturnal GERD symptoms which include burning in the chest, regurgitation, sour taste in the mouth.  She almost sleeps in an upright position for a long time.  She has been taking omeprazole 40 mg once a day at night.  She also thinks that she may have thrush in her mouth and she used nystatin swish and swallow from her previous prescription.  She is requesting for a refill.  She denies any difficulty swallowing.  She has gained about 20 pounds.  She was very stressful at her job and she switched her job now which she is liking it.  She works at [employer] as a Merchandiser, retail.  Her biggest weakness is rice which led to weight gain.  She has been drinking soda daily for last 1 week.  Does not consume red meat much. Iron deficiency anemia has resolved.  She is taking Metamucil daily which keeps her bowels regular  NSAIDs: None  Antiplts/Anticoagulants/Anti thrombotics: None  GI Procedures:  Colonoscopy 02/27/2022 Sigmoid colon diverticulosis  Upper endoscopy 08/29/2020 DIAGNOSIS:  A. DUODENUM; COLD BIOPSY:  - UNREMARKABLE DUODENAL MUCOSA.  - NEGATIVE FOR DYSPLASIA AND MALIGNANCY.   B.  STOMACH, RANDOM; COLD BIOPSY:  - ANTRAL MUCOSA WITH MODERATE CHRONIC ACTIVE GASTRITIS.  - DEFINITIVE HELICOBACTER ORGANISMS ARE NOT IDENTIFIED ON IHC, SEE  COMMENT.  - OXYNTIC MUCOSA WITH MILD CHRONIC GASTRITIS AND FEATURES CONSISTENT  WITH PROTON PUMP INHIBITOR EFFECT.  - NEGATIVE FOR DYSPLASIA AND MALIGNANCY.   C.  ESOPHAGUS, RANDOM; COLD BIOPSY:  - FRAGMENTS OF SQUAMOUS MUCOSA WITH FOCAL CHANGES CONSISTENT WITH  REFLUX.  - NEGATIVE FOR DYSPLASIA AND MALIGNANCY.    EGD 02/01/2018 at [facility]    EGD and colonoscopy 01/03/2013 at [facility]      EGD 11/29/2007 at [facility]  No evidence of H. Pylori  Past Medical History:  Diagnosis Date   Acute blood loss anemia    Arthritis    Asthma    COVID    COVID-19    07/24/20, 05/19/21    Diverticulitis    GERD (gastroesophageal reflux disease)    GIB (gastrointestinal bleeding)    diverticular 01/23/22-01/24/22 [facility] s/p emobolization & 15 years ago in [place]   Hiatal hernia    History of blood transfusion    History of chicken pox    History of GI bleed    2013/14   Hyperlipidemia    Hypertension    Hypovolemic shock (HCC) 04/06/2022   Leukocytosis    Type O blood, Rh positive     Past Surgical History:  Procedure Laterality Date   BREAST CYST ASPIRATION Right    CESAREAN SECTION     04/27/84   COLONOSCOPY     COLONOSCOPY WITH PROPOFOL N/A 02/27/2022   Procedure: COLONOSCOPY WITH PROPOFOL;  Surgeon: [name], MD;  Location: [facility] ENDOSCOPY;  Service: Gastroenterology;  Laterality: N/A;   EMBOLIZATION (CATH LAB) N/A 01/23/2022   Procedure: EMBOLIZATION;  Surgeon: [name], MD;  Location: [facility]  INVASIVE CV LAB;  Service: Cardiovascular;  Laterality: N/A;   EMBOLIZATION (CATH LAB) N/A 04/06/2022   Procedure: EMBOLIZATION;  Surgeon: Annice Needy, MD;  Location: [facility] INVASIVE CV LAB;  Service: Cardiovascular;  Laterality: N/A;   ESOPHAGOGASTRODUODENOSCOPY (EGD) WITH PROPOFOL N/A 08/29/2020   Procedure: ESOPHAGOGASTRODUODENOSCOPY (EGD) WITH PROPOFOL;  Surgeon: [name], MD;  Location: [facility] ENDOSCOPY;  Service: Gastroenterology;  Laterality: N/A;   IR ANGIOGRAM VISCERAL SELECTIVE  04/05/2022   IR ANGIOGRAM VISCERAL SELECTIVE  04/05/2022   IR EMBO ART  VEN HEMORR LYMPH EXTRAV  INC GUIDE ROADMAPPING  04/05/2022   IR US GUIDE VASC ACCESS RIGHT  04/05/2022   ORIF ACETABULAR FRACTURE      Current Outpatient Medications:    albuterol (VENTOLIN HFA) 108 (90 Base) MCG/ACT inhaler, INHALE 1-2 PUFFS BY MOUTH EVERY 6 (SIX) HOURS AS NEEDED, Disp: 18 each, Rfl: 11   atorvastatin (LIPITOR) 10 MG tablet, Take 1 tablet (10 mg total) by mouth daily at 6 PM., Disp: 90 tablet, Rfl: 3   citalopram (CELEXA) 10 MG tablet, Take 1 tablet (10 mg total) by mouth daily., Disp:  90 tablet, Rfl: 3   diltiazem (CARDIZEM CD) 120 MG 24 hr capsule, Take 1 capsule (120 mg total) by mouth 2 (two) times daily., Disp: 180 capsule, Rfl: 3   fluticasone (FLONASE) 50 MCG/ACT nasal spray, SPRAY 2 SPRAYS INTO EACH NOSTRIL EVERY DAY (Patient taking differently: Place 2 sprays into both nostrils daily as needed for allergies or rhinitis.), Disp: 48 mL, Rfl: 3   losartan (COZAAR) 25 MG tablet, TAKE 1 TABLET (25 MG TOTAL) BY MOUTH DAILY., Disp: 90 tablet, Rfl: 1   montelukast (SINGULAIR) 10 MG tablet, Take 1 tablet (10 mg total) by mouth at bedtime., Disp: 90 tablet, Rfl: 3   omeprazole (PRILOSEC) 40 MG capsule, Take 1 capsule (40 mg total) by mouth 2 (two) times daily., Disp: 180 capsule, Rfl: 3   Specialty Vitamins Products (WOMENS VITA PAK PO), Take by mouth daily., Disp: , Rfl:    Family History  Problem Relation Age of Onset   Hypertension Maternal Aunt    Diabetes Maternal Aunt    Breast cancer Neg Hx      Social History   Tobacco Use   Smoking status: Never   Smokeless tobacco: Never  Vaping Use   Vaping status: Never Used  Substance Use Topics   Alcohol use: Not Currently   Drug use: Not Currently    Allergies as of 02/25/2023 - Review Complete 02/25/2023  Allergen Reaction Noted   Oxycodone Other (See Comments) 08/18/2018    Review of Systems:    All systems reviewed and negative except where noted in HPI.   Physical Exam:  BP 127/74 (BP Location: Left Arm, Patient Position: Sitting, Cuff Size: Large)   Pulse 83   Temp 97.9 F (36.6 C) (Oral)   Ht 5\' 4"  (1.626 m)   Wt 221 lb (100.2 kg)   BMI 37.93 kg/m  No LMP recorded. Patient is postmenopausal.  General:   Alert,  Well-developed, well-nourished, pleasant and cooperative in NAD Head:  Normocephalic and atraumatic. Eyes:  Sclera clear, no icterus.   Conjunctiva pink. Ears:  Normal auditory acuity. Nose:  No deformity, discharge, or lesions. Mouth:  No deformity or lesions,oropharynx pink &  moist. Neck:  Supple; no masses or thyromegaly. Lungs:  Respirations even and unlabored.  Clear throughout to auscultation.   No wheezes, crackles, or rhonchi. No acute distress. Heart:  Regular rate and rhythm; no  murmurs, clicks, rubs, or gallops. Abdomen:  Normal bowel sounds. Soft, non-tender and mildly distended, tympanic without masses, hepatosplenomegaly or hernias noted.  No guarding or rebound tenderness.   Rectal: Not performed Msk:  Symmetrical without gross deformities. Good, equal movement & strength bilaterally. Pulses:  Normal pulses noted. Extremities:  No clubbing or edema.  No cyanosis. Neurologic:  Alert and oriented x3;  grossly normal neurologically. Skin:  Intact without significant lesions or rashes. No jaundice. Psych:  Alert and cooperative. Normal mood and affect.  Imaging Studies: No abdominal imaging  Assessment and Plan:   Cheyeanne Jarman is a 67 y.o. female with morbid obesity, hypertension, hyperlipidemia, chronic GERD, history of diverticular bleed s/p embolization in 01/2022 secondary to severe constipation  Chronic GERD Increase omeprazole to 40 mg twice daily Reiterated on antireflux lifestyle Discussed about weight loss by healthy eating habits and physical activity Recommend EGD for further evaluation Can refill nystatin suspension, swish and swallow  Iron deficiency anemia secondary to diverticular bleed Status post IV iron infusions Anemia has resolved Continue to manage constipation with high-fiber diet, fiber supplements, adequate intake of water and MiraLAX as needed   Follow up as needed   Cheryl Repress, MD

## 2023-02-26 ENCOUNTER — Encounter: Payer: Self-pay | Admitting: Internal Medicine

## 2023-02-26 DIAGNOSIS — B370 Candidal stomatitis: Secondary | ICD-10-CM | POA: Insufficient documentation

## 2023-03-02 NOTE — Telephone Encounter (Signed)
See medication refill request. Duplicate message.

## 2023-03-06 ENCOUNTER — Encounter: Payer: Self-pay | Admitting: Oncology

## 2023-03-06 ENCOUNTER — Ambulatory Visit
Admission: RE | Admit: 2023-03-06 | Discharge: 2023-03-06 | Disposition: A | Discharge status: Home / Self Care | Payer: Managed Care, Other (non HMO) | Source: Ambulatory Visit | Attending: Emergency Medicine | Admitting: Emergency Medicine

## 2023-03-06 VITALS — BP 130/75 | HR 80 | Temp 97.9°F | Resp 18

## 2023-03-06 DIAGNOSIS — N3000 Acute cystitis without hematuria: Secondary | ICD-10-CM | POA: Diagnosis not present

## 2023-03-06 LAB — POCT URINALYSIS DIP (MANUAL ENTRY)
Bilirubin, UA: NEGATIVE
Glucose, UA: NEGATIVE mg/dL
Ketones, POC UA: NEGATIVE mg/dL
Nitrite, UA: POSITIVE — AB
Protein Ur, POC: 100 mg/dL — AB
Spec Grav, UA: 1.01
Urobilinogen, UA: 0.2 U/dL
pH, UA: 5.5

## 2023-03-06 MED ORDER — NITROFURANTOIN MONOHYD MACRO 100 MG PO CAPS: 100.0000 mg | ORAL_CAPSULE | Freq: Two times a day (BID) | ORAL | 0 refills | Status: DC

## 2023-03-06 NOTE — ED Provider Notes (Signed)
Renaldo Fiddler    CSN: [id number] Arrival date & time: 03/06/23  1408      History   Chief Complaint Chief Complaint  Patient presents with   Dysuria    Frequent urination - Entered by patient    HPI Cheryl Coleman is a 67 y.o. female.   Patient presents for evaluation of dysuria, frequency, urgency and lower abdominal pressure occurring for 2 days.  Has not attempted treatment.  Denies presence of fevers, hematuria, flank pain and vaginal symptoms.  Endorses over the last year she has begun to have reoccurring infections with today's symptoms being the third time.  Past Medical History:  Diagnosis Date   Acute blood loss anemia    Arthritis    Asthma    COVID    COVID-19    07/24/20, 05/19/21   Diverticulitis    Diverticulosis of colon with hemorrhage    GERD (gastroesophageal reflux disease)    GIB (gastrointestinal bleeding)    diverticular 01/23/22-01/24/22 [facility] s/p emobolization & 15 years ago in [place]   Hiatal hernia    History of blood transfusion    History of chicken pox    History of GI bleed    2013/14   Hyperlipidemia    Hypertension    Hypovolemic shock (HCC) 04/06/2022   Leukocytosis    Type O blood, Rh positive     Patient Active Problem List   Diagnosis Date Noted   Oral thrush 02/26/2023   Otitis media, unspecified, right ear 01/25/2023   Edema of right lower leg due to venous stasis 01/25/2023   Eczema of external ear, bilateral 01/25/2023   UTI due to Klebsiella species 01/20/2023   History of iron deficiency anemia 09/29/2022   Iron deficiency anemia 07/06/2022   Hypomagnesemia    Sigmoid diverticulosis    External hemorrhoids    History of GI diverticular bleed 01/23/2022   BMI 38.0-38.9,adult 01/14/2022   Gallstones 10/24/2020   Osteopenia 05/09/2020   Onychomycosis 11/03/2019   Asthma 08/29/2019   Hiatal hernia 06/06/2019   Anxiety 11/25/2018   Palpitations 10/19/2018   Vitamin D deficiency 10/06/2018   Prediabetes 10/06/2018    Essential hypertension 08/18/2018   Gastroesophageal reflux disease 08/18/2018   Hyperlipidemia 08/18/2018   Mild intermittent asthma 08/18/2018   Allergic rhinitis due to pollen 05/11/2014    Past Surgical History:  Procedure Laterality Date   BREAST CYST ASPIRATION Right    CESAREAN SECTION     04/27/84   COLONOSCOPY     COLONOSCOPY WITH PROPOFOL N/A 02/27/2022   Procedure: COLONOSCOPY WITH PROPOFOL;  Surgeon: [name], MD;  Location: [facility] ENDOSCOPY;  Service: Gastroenterology;  Laterality: N/A;   EMBOLIZATION (CATH LAB) N/A 01/23/2022   Procedure: EMBOLIZATION;  Surgeon: [name], MD;  Location: [facility] INVASIVE CV LAB;  Service: Cardiovascular;  Laterality: N/A;   EMBOLIZATION (CATH LAB) N/A 04/06/2022   Procedure: EMBOLIZATION;  Surgeon: Annice Needy, MD;  Location: [facility] INVASIVE CV LAB;  Service: Cardiovascular;  Laterality: N/A;   ESOPHAGOGASTRODUODENOSCOPY (EGD) WITH PROPOFOL N/A 08/29/2020   Procedure: ESOPHAGOGASTRODUODENOSCOPY (EGD) WITH PROPOFOL;  Surgeon: [name], MD;  Location: [facility] ENDOSCOPY;  Service: Gastroenterology;  Laterality: N/A;   IR ANGIOGRAM VISCERAL SELECTIVE  04/05/2022   IR ANGIOGRAM VISCERAL SELECTIVE  04/05/2022   IR EMBO ART  VEN HEMORR LYMPH EXTRAV  INC GUIDE ROADMAPPING  04/05/2022   IR US GUIDE VASC ACCESS RIGHT  04/05/2022   ORIF ACETABULAR FRACTURE      OB  History   No obstetric history on file.      Home Medications    Prior to Admission medications   Medication Sig Start Date End Date Taking? Authorizing Provider  nitrofurantoin, macrocrystal-monohydrate, (MACROBID) 100 MG capsule Take 1 capsule (100 mg total) by mouth 2 (two) times daily. 03/06/23  Yes Stefannie Defeo R, NP  albuterol (VENTOLIN HFA) 108 (90 Base) MCG/ACT inhaler INHALE 1-2 PUFFS BY MOUTH EVERY 6 (SIX) HOURS AS NEEDED 02/02/23   [name], MD  atorvastatin (LIPITOR) 10 MG tablet Take 1 tablet (10 mg total) by mouth daily at 6 PM. 09/29/22   [name], MD  citalopram (CELEXA) 10 MG tablet Take 1 tablet (10 mg total) by mouth daily. 09/29/22   [name], MD  diltiazem (CARDIZEM CD) 120 MG 24 hr capsule Take 1 capsule (120 mg total) by mouth 2 (two) times daily. 05/12/22   Debbe Odea, MD  fluticasone (FLONASE) 50 MCG/ACT nasal spray SPRAY 2 SPRAYS INTO EACH NOSTRIL EVERY DAY Patient taking differently: Place 2 sprays into both nostrils daily as needed for allergies or rhinitis. 09/09/21   [name], [name], MD  losartan (COZAAR) 25 MG tablet TAKE 1 TABLET (25 MG TOTAL) BY MOUTH DAILY. 12/28/22   [name], MD  montelukast (SINGULAIR) 10 MG tablet Take 1 tablet (10 mg total) by mouth at bedtime. 12/28/22   [name], MD  nystatin (MYCOSTATIN) 100000 UNIT/ML suspension Take 5 ML swish and swallow two to three times daily as needed 02/25/23   [name], MD  omeprazole (PRILOSEC) 40 MG capsule Take 1 capsule (40 mg total) by mouth 2 (two) times daily. 09/29/22   [name], MD  Specialty Vitamins Products (WOMENS VITA PAK PO) Take by mouth daily.    [provider]    Family History Family History  Problem Relation Age of Onset   Hypertension Maternal Aunt    Diabetes Maternal Aunt    Breast cancer Neg Hx     Social History Social History   Tobacco Use   Smoking status: Never   Smokeless tobacco: Never  Vaping Use   Vaping status: Never Used  Substance Use Topics   Alcohol use: Not Currently   Drug use: Not Currently     Allergies   Oxycodone   Review of Systems Review of Systems  Constitutional: Negative.   HENT: Negative.    Respiratory: Negative.    Cardiovascular: Negative.   Gastrointestinal: Negative.   Genitourinary:  Positive for dysuria, frequency and urgency. Negative for decreased urine volume, difficulty urinating, dyspareunia, enuresis, flank pain, genital sores, hematuria, menstrual problem, pelvic pain, vaginal bleeding, vaginal discharge and vaginal  pain.     Physical Exam Triage Vital Signs ED Triage Vitals  Encounter Vitals Group     BP 03/06/23 1430 130/75     Systolic BP Percentile --      Diastolic BP Percentile --      Pulse Rate 03/06/23 1430 80     Resp 03/06/23 1430 18     Temp 03/06/23 1459 97.9 F (36.6 C)     Temp Source 03/06/23 1459 Oral     SpO2 03/06/23 1430 98 %     Weight --      Height --      Head Circumference --      Peak Flow --      Pain Score 03/06/23 1455 4     Pain Loc --  Pain Education --      Exclude from Growth Chart --    No data found.  Updated Vital Signs BP 130/75 (BP Location: Left Arm)   Pulse 80   Temp 97.9 F (36.6 C) (Oral)   Resp 18   SpO2 98%   Visual Acuity Right Eye Distance:   Left Eye Distance:   Bilateral Distance:    Right Eye Near:   Left Eye Near:    Bilateral Near:     Physical Exam Constitutional:      Appearance: Normal appearance.  HENT:     Head: Normocephalic.  Eyes:     Extraocular Movements: Extraocular movements intact.  Pulmonary:     Effort: Pulmonary effort is normal.  Abdominal:     General: Abdomen is flat. Bowel sounds are normal. There is no distension.     Palpations: Abdomen is soft.     Tenderness: There is no abdominal tenderness. There is no right CVA tenderness, left CVA tenderness or guarding.  Neurological:     Mental Status: She is alert and oriented to person, place, and time. Mental status is at baseline.      UC Treatments / Results  Labs (all labs ordered are listed, but only abnormal results are displayed) Labs Reviewed  POCT URINALYSIS DIP (MANUAL ENTRY) - Abnormal; Notable for the following components:      Result Value   Clarity, UA cloudy (*)    Blood, UA moderate (*)    Protein Ur, POC =100 (*)    Nitrite, UA Positive (*)    Leukocytes, UA Small (1+) (*)    All other components within normal limits    EKG   Radiology No results found.  Procedures Procedures (including critical care  time)  Medications Ordered in UC Medications - No data to display  Initial Impression / Assessment and Plan / UC Course  I have reviewed the triage vital signs and the nursing notes.  Pertinent labs & imaging results that were available during my care of the patient were reviewed by me and considered in my medical decision making (see chart for details). Acute cystitis without hematuria  Urinalysis showing leukocytes and nitrates, sent for culture, discussed findings, Macrobid sent to pharmacy, recommended supportive care through good fluid intake primarily with water, good hygiene measures and over-the-counter Pyridium as needed, has had reoccurring infections over the last year therefore given walking referral to urology for follow-up Final Clinical Impressions(s) / UC Diagnoses   Final diagnoses:  Acute cystitis without hematuria     Discharge Instructions      Your urinalysis shows Claus Silvestro blood cells and nitrates which are indicative of infection, your urine will be sent to the lab to determine exactly which bacteria is present, if any changes need to be made to your medications you will be notified  Begin use of Macrobid every morning and every evening for 5 days  You may use over-the-counter Azo to help minimize your symptoms until antibiotic removes bacteria, this medication will turn your urine orange  Increase your fluid intake through use of water  As always practice good hygiene, wiping front to back and avoidance of scented vaginal products to prevent further irritation  If symptoms continue to persist after use of medication or recur please follow-up with urgent care or your primary doctor as needed  You have had 3 infections within the last year you have been given information to urology who is the bladder specialist for follow-up  ED Prescriptions     Medication Sig Dispense Auth. Provider   nitrofurantoin, macrocrystal-monohydrate, (MACROBID) 100 MG capsule  Take 1 capsule (100 mg total) by mouth 2 (two) times daily. 10 capsule Valinda Hoar, NP      PDMP not reviewed this encounter.   Valinda Hoar, NP 03/06/23 1506

## 2023-03-06 NOTE — ED Triage Notes (Signed)
Patient presents to UC for dysuria and urinary freq x 2 days ago.

## 2023-03-06 NOTE — Discharge Instructions (Addendum)
Your urinalysis shows Cheryl Coleman blood cells and nitrates which are indicative of infection, your urine will be sent to the lab to determine exactly which bacteria is present, if any changes need to be made to your medications you will be notified  Begin use of Macrobid every morning and every evening for 5 days  You may use over-the-counter Azo to help minimize your symptoms until antibiotic removes bacteria, this medication will turn your urine orange  Increase your fluid intake through use of water  As always practice good hygiene, wiping front to back and avoidance of scented vaginal products to prevent further irritation  If symptoms continue to persist after use of medication or recur please follow-up with urgent care or your primary doctor as needed  You have had 3 infections within the last year you have been given information to urology who is the bladder specialist for follow-up

## 2023-03-08 DIAGNOSIS — K219 Gastro-esophageal reflux disease without esophagitis: Secondary | ICD-10-CM | POA: Diagnosis not present

## 2023-03-08 DIAGNOSIS — J301 Allergic rhinitis due to pollen: Secondary | ICD-10-CM | POA: Diagnosis not present

## 2023-03-08 LAB — URINE CULTURE: Culture: >=100000 — AB

## 2023-03-09 ENCOUNTER — Encounter: Payer: Self-pay | Admitting: Family Medicine

## 2023-03-10 ENCOUNTER — Telehealth: Payer: Self-pay

## 2023-03-10 MED ORDER — CEPHALEXIN 500 MG PO CAPS: 500.0000 mg | ORAL_CAPSULE | Freq: Four times a day (QID) | ORAL | 0 refills | Status: DC

## 2023-03-10 MED ORDER — SULFAMETHOXAZOLE-TRIMETHOPRIM 800-160 MG PO TABS: 1.0000 | ORAL_TABLET | Freq: Two times a day (BID) | ORAL | 0 refills | Status: AC

## 2023-03-10 NOTE — Telephone Encounter (Signed)
Per protocol, pt to dc Macrobid and start tx with Bactrim.  Attempted to reach patient x1. LVM. Rx sent to pharmacy on file.

## 2023-03-11 NOTE — Telephone Encounter (Signed)
Called Patient and she states she is scheduled to see her PCP which is Dr. Darrick Huntsman so she is good.

## 2023-03-12 NOTE — Telephone Encounter (Signed)
Forwarding to PCP so she is aware.

## 2023-03-17 DIAGNOSIS — H43392 Other vitreous opacities, left eye: Secondary | ICD-10-CM | POA: Diagnosis not present

## 2023-03-17 DIAGNOSIS — H52203 Unspecified astigmatism, bilateral: Secondary | ICD-10-CM | POA: Diagnosis not present

## 2023-03-17 DIAGNOSIS — H2513 Age-related nuclear cataract, bilateral: Secondary | ICD-10-CM | POA: Diagnosis not present

## 2023-03-17 DIAGNOSIS — H43812 Vitreous degeneration, left eye: Secondary | ICD-10-CM | POA: Diagnosis not present

## 2023-04-06 ENCOUNTER — Other Ambulatory Visit: Payer: Self-pay

## 2023-04-06 ENCOUNTER — Telehealth: Payer: Self-pay | Admitting: Gastroenterology

## 2023-04-06 DIAGNOSIS — K219 Gastro-esophageal reflux disease without esophagitis: Secondary | ICD-10-CM

## 2023-04-06 NOTE — Telephone Encounter (Signed)
Pt requesting call back to reschedule procedure

## 2023-04-06 NOTE — Telephone Encounter (Signed)
Called patient and reschedule EGD to 05/13/2023. Sent new instructions with new dates and sent to mychart account

## 2023-04-07 ENCOUNTER — Encounter: Admission: RE | Payer: Self-pay | Source: Home / Self Care

## 2023-04-07 ENCOUNTER — Ambulatory Visit: Admission: RE | Admit: 2023-04-07 | Payer: Medicare HMO | Source: Home / Self Care | Admitting: Gastroenterology

## 2023-04-07 SURGERY — ESOPHAGOGASTRODUODENOSCOPY (EGD) WITH PROPOFOL
Anesthesia: General

## 2023-04-09 ENCOUNTER — Encounter: Payer: Medicare HMO | Admitting: Internal Medicine

## 2023-04-12 ENCOUNTER — Other Ambulatory Visit: Payer: Self-pay | Admitting: *Deleted

## 2023-04-12 DIAGNOSIS — D509 Iron deficiency anemia, unspecified: Secondary | ICD-10-CM

## 2023-04-12 DIAGNOSIS — D72820 Lymphocytosis (symptomatic): Secondary | ICD-10-CM

## 2023-04-19 ENCOUNTER — Encounter: Payer: Medicare HMO | Admitting: Internal Medicine

## 2023-04-19 ENCOUNTER — Ambulatory Visit: Payer: Medicare HMO | Admitting: Internal Medicine

## 2023-04-20 ENCOUNTER — Inpatient Hospital Stay: Payer: Medicare HMO

## 2023-04-20 ENCOUNTER — Inpatient Hospital Stay: Payer: Medicare HMO | Admitting: Oncology

## 2023-04-21 ENCOUNTER — Ambulatory Visit (INDEPENDENT_AMBULATORY_CARE_PROVIDER_SITE_OTHER): Payer: Medicare HMO | Admitting: Urology

## 2023-04-21 ENCOUNTER — Encounter: Payer: Self-pay | Admitting: Oncology

## 2023-04-21 ENCOUNTER — Encounter: Payer: Self-pay | Admitting: Urology

## 2023-04-21 VITALS — BP 125/70 | HR 87 | Ht 64.0 in | Wt 223.6 lb

## 2023-04-21 DIAGNOSIS — N3000 Acute cystitis without hematuria: Secondary | ICD-10-CM

## 2023-04-21 DIAGNOSIS — N3281 Overactive bladder: Secondary | ICD-10-CM | POA: Diagnosis not present

## 2023-04-21 MED ORDER — ESTRADIOL 0.1 MG/GM VA CREA: TOPICAL_CREAM | VAGINAL | 12 refills | Status: DC

## 2023-04-21 MED ORDER — GEMTESA 75 MG PO TABS: 1.0000 | ORAL_TABLET | Freq: Every day | ORAL | 0 refills | Status: DC

## 2023-04-21 NOTE — Progress Notes (Signed)
Marcelle Overlie Plume,acting as a scribe for Vanna Scotland, MD.,have documented all relevant documentation on the behalf of Vanna Scotland, MD,as directed by  Vanna Scotland, MD while in the presence of Vanna Scotland, MD.  04/21/2023 4:31 PM   Cheryl Coleman 1956-03-11 [id number]  Referring provider: [name], MD [address] [place],  [place] [address]  Chief Complaint  Patient presents with   Establish Care    HPI: 67 year-old female who is self-referred for further evaluation of recurrent  UTIs.   She was seen and evaluated on 03/08/2023 with lower abdominal pain, dysuria, and frequency for 2 days. Her urinalysis at the time showed moderate blood, nitrate positive, small leukocytes. A urine culture grew Klebsiella.   She also had a Klebsiella UTI in 01/2023 with a similar resistance pattern.   She had an abdominal ultrasound in 10/2020 that showed normal kidneys bilaterally with no stones.   She also had a UTI a little over a year ago. There is only 2 documented infections.   Her urinalysis today does show >30 WBC per high powered field, otherwise unremarkable.   Today, she reports urgency and incontinence, particularly when away from home, leading to the use of pads. The urgency has been a long-standing issue, with a history of seeing a urologist over 20 years ago. The patient denies any vaginal symptoms or pressure. She reports a rash in the left groin area, which was treated and has improved   PMH: Past Medical History:  Diagnosis Date   Acute blood loss anemia    Arthritis    Asthma    COVID    COVID-19    07/24/20, 05/19/21   Diverticulitis    Diverticulosis of colon with hemorrhage    GERD (gastroesophageal reflux disease)    GIB (gastrointestinal bleeding)    diverticular 01/23/22-01/24/22 [facility] s/p emobolization & 15 years ago in [place]   Hiatal hernia    History of blood transfusion    History of chicken pox    History of GI bleed    2013/14    Hyperlipidemia    Hypertension    Hypovolemic shock (HCC) 04/06/2022   Leukocytosis    Type O blood, Rh positive     Surgical History: Past Surgical History:  Procedure Laterality Date   BREAST CYST ASPIRATION Right    CESAREAN SECTION     04/27/84   COLONOSCOPY     COLONOSCOPY WITH PROPOFOL N/A 02/27/2022   Procedure: COLONOSCOPY WITH PROPOFOL;  Surgeon: [name], MD;  Location: [facility] ENDOSCOPY;  Service: Gastroenterology;  Laterality: N/A;   EMBOLIZATION (CATH LAB) N/A 01/23/2022   Procedure: EMBOLIZATION;  Surgeon: [name], MD;  Location: [facility] INVASIVE CV LAB;  Service: Cardiovascular;  Laterality: N/A;   EMBOLIZATION (CATH LAB) N/A 04/06/2022   Procedure: EMBOLIZATION;  Surgeon: Annice Needy, MD;  Location: [facility] INVASIVE CV LAB;  Service: Cardiovascular;  Laterality: N/A;   ESOPHAGOGASTRODUODENOSCOPY (EGD) WITH PROPOFOL N/A 08/29/2020   Procedure: ESOPHAGOGASTRODUODENOSCOPY (EGD) WITH PROPOFOL;  Surgeon: [name], MD;  Location: [facility] ENDOSCOPY;  Service: Gastroenterology;  Laterality: N/A;   IR ANGIOGRAM VISCERAL SELECTIVE  04/05/2022   IR ANGIOGRAM VISCERAL SELECTIVE  04/05/2022   IR EMBO ART  VEN HEMORR LYMPH EXTRAV  INC GUIDE ROADMAPPING  04/05/2022   IR US GUIDE VASC ACCESS RIGHT  04/05/2022   ORIF ACETABULAR FRACTURE      Home Medications:  Allergies as of 04/21/2023       Reactions  Oxycodone Other (See Comments)   Feels bad        Medication List        Accurate as of April 21, 2023  4:31 PM. If you have any questions, ask your nurse or doctor.          STOP taking these medications    nystatin 100000 UNIT/ML suspension Commonly known as: MYCOSTATIN Stopped by: Vanna Scotland       TAKE these medications    albuterol 108 (90 Base) MCG/ACT inhaler Commonly known as: VENTOLIN HFA INHALE 1-2 PUFFS BY MOUTH EVERY 6 (SIX) HOURS AS NEEDED   atorvastatin 10 MG tablet Commonly known as: LIPITOR Take 1 tablet (10 mg total) by  mouth daily at 6 PM.   citalopram 10 MG tablet Commonly known as: CeleXA Take 1 tablet (10 mg total) by mouth daily.   diltiazem 120 MG 24 hr capsule Commonly known as: Cardizem CD Take 1 capsule (120 mg total) by mouth 2 (two) times daily.   estradiol 0.1 MG/GM vaginal cream Commonly known as: ESTRACE Estrogen Cream Instruction Discard applicator Apply pea sized amount to tip of finger to urethra before bed. Wash hands well after application. Use Monday, Wednesday and Friday Started by: Vanna Scotland   fluticasone 50 MCG/ACT nasal spray Commonly known as: FLONASE SPRAY 2 SPRAYS INTO EACH NOSTRIL EVERY DAY What changed: See the new instructions.   Gemtesa 75 MG Tabs Generic drug: Vibegron Take 1 tablet (75 mg total) by mouth daily. Started by: Vanna Scotland   losartan 25 MG tablet Commonly known as: COZAAR TAKE 1 TABLET (25 MG TOTAL) BY MOUTH DAILY.   montelukast 10 MG tablet Commonly known as: SINGULAIR Take 1 tablet (10 mg total) by mouth at bedtime.   omeprazole 40 MG capsule Commonly known as: PRILOSEC Take 1 capsule (40 mg total) by mouth 2 (two) times daily.   WOMENS VITA PAK PO Take by mouth daily.        Allergies:  Allergies  Allergen Reactions   Oxycodone Other (See Comments)    Feels bad    Family History: Family History  Problem Relation Age of Onset   Hypertension Maternal Aunt    Diabetes Maternal Aunt    Breast cancer Neg Hx     Social History:  reports that she has never smoked. She has never used smokeless tobacco. She reports that she does not currently use alcohol. She reports that she does not currently use drugs.   Physical Exam: BP 125/70   Pulse 87   Ht 5\' 4"  (1.626 m)   Wt 223 lb 9.6 oz (101.4 kg)   BMI 38.38 kg/m   Constitutional:  Alert and oriented, No acute distress. HEENT: [place] AT, moist mucus membranes.  Trachea midline, no masses. Neurologic: Grossly intact, no focal deficits, moving all 4 extremities. Psychiatric:  Normal mood and affect.  Results for orders placed or performed in visit on 04/21/23  Microscopic Examination   Urine  Result Value Ref Range   WBC, UA >30 (A) 0 - 5 /hpf   RBC, Urine 0-2 0 - 2 /hpf   Epithelial Cells (non renal) 0-10 0 - 10 /hpf   Bacteria, UA Many (A) None seen/Few  Urinalysis, Complete  Result Value Ref Range   Specific Gravity, UA 1.010 1.005 - 1.030   pH, UA 6.0 5.0 - 7.5   Color, UA Yellow Yellow   Appearance Ur Hazy (A) Clear   Leukocytes,UA 2+ (A) Negative   Protein,UA Trace (  A) Negative/Trace   Glucose, UA Negative Negative   Ketones, UA Negative Negative   RBC, UA Trace (A) Negative   Bilirubin, UA Negative Negative   Urobilinogen, Ur 0.2 0.2 - 1.0 mg/dL   Nitrite, UA Negative Negative   Microscopic Examination See below:      Assessment & Plan:    1. Recurrent UTI - Estrace 0.1 mg vaginal cream - Urine culture ordered today, will treated as needed but doesn't feel like she is infected today  - Recurrent UTI Prevention Strategies  Stay well hydrated. Get a moderate amount of exercise. Eat a diet rich in fruit and vegetables. Start a bowel regimen to manage your constipation. Your goal is to have consistent, formed bowel movements that are easy for you to pass. You may use either of the over-the-counter supplements Benefiber or Miralax to help with this. I recommend that you try Benefiber first and move on to Miralax if this is not helping you enough. You may adjust the recommended dose of Miralax (one capful daily) to achieve this goal. Start taking an over-the-counter cranberry supplement for urinary tract health. Take this once or twice daily on an empty stomach, e.g. right before bed. Start taking an over-the-counter d-mannose supplement. Take this daily per packaging instructions. Start taking an over-the-counter probiotic containing the bacterial species called Lactobacillus. Take this daily. Start vaginal estrogen cream. Apply a pea-sized  amount around the opening of the urethra every day for 2 weeks, then three times weekly forever.  2. Overactive bladder - Start on Gemtesa 75 mg, monitor for side effects such as dry mouth, dry eyes, and constipation.  -This seems to be more chronic than her more acute recurrent urinary tract infections and thus we will treat her overactive component -We did discuss behavioral modification as well today -She prefers to avoid anticholinergics given her history of gastrointestinal symptoms and concern for chronic constipation.  Also check formulary to let us know any of the beta 3 agonist are on her formulary.  Return in about 6 weeks (around 06/02/2023) for reassessment urinary symptoms and medication efficacy. [facility] [address] [place], [place] [address] [phone]

## 2023-04-21 NOTE — Patient Instructions (Signed)
For UTI prevention take cranberry tablets, probiotic, D-Mannose daily.  

## 2023-04-22 LAB — URINALYSIS, COMPLETE
Bilirubin, UA: NEGATIVE
Glucose, UA: NEGATIVE
Ketones, UA: NEGATIVE
Nitrite, UA: NEGATIVE
Specific Gravity, UA: 1.01 (ref 1.005–1.030)
Urobilinogen, Ur: 0.2 mg/dL (ref 0.2–1.0)
pH, UA: 6 (ref 5.0–7.5)

## 2023-04-22 LAB — MICROSCOPIC EXAMINATION: WBC, UA: >30 /HPF — AB (ref 0–5)

## 2023-04-24 LAB — CULTURE, URINE COMPREHENSIVE

## 2023-04-26 ENCOUNTER — Other Ambulatory Visit: Payer: Self-pay

## 2023-04-26 MED ORDER — SULFAMETHOXAZOLE-TRIMETHOPRIM 800-160 MG PO TABS: 1.0000 | ORAL_TABLET | Freq: Two times a day (BID) | ORAL | 0 refills | Status: DC

## 2023-04-30 ENCOUNTER — Inpatient Hospital Stay: Payer: Medicare HMO | Attending: Oncology

## 2023-04-30 ENCOUNTER — Encounter: Payer: Self-pay | Admitting: Oncology

## 2023-04-30 ENCOUNTER — Inpatient Hospital Stay: Payer: Medicare HMO | Admitting: Oncology

## 2023-05-12 ENCOUNTER — Encounter: Payer: Medicare HMO | Admitting: Internal Medicine

## 2023-05-12 ENCOUNTER — Encounter: Payer: Self-pay | Admitting: Gastroenterology

## 2023-05-13 ENCOUNTER — Ambulatory Visit: Payer: Medicare HMO | Admitting: Anesthesiology

## 2023-05-13 ENCOUNTER — Other Ambulatory Visit: Payer: Self-pay

## 2023-05-13 ENCOUNTER — Encounter
Admission: RE | Disposition: A | Discharge status: Home / Self Care | Payer: Self-pay | Source: Home / Self Care | Attending: Gastroenterology

## 2023-05-13 ENCOUNTER — Ambulatory Visit
Admission: RE | Admit: 2023-05-13 | Discharge: 2023-05-13 | Disposition: A | Discharge status: Home / Self Care | Payer: Medicare HMO | Attending: Gastroenterology | Admitting: Gastroenterology

## 2023-05-13 ENCOUNTER — Encounter: Payer: Self-pay | Admitting: Gastroenterology

## 2023-05-13 DIAGNOSIS — K5730 Diverticulosis of large intestine without perforation or abscess without bleeding: Secondary | ICD-10-CM | POA: Insufficient documentation

## 2023-05-13 DIAGNOSIS — M1990 Unspecified osteoarthritis, unspecified site: Secondary | ICD-10-CM | POA: Diagnosis not present

## 2023-05-13 DIAGNOSIS — I10 Essential (primary) hypertension: Secondary | ICD-10-CM | POA: Insufficient documentation

## 2023-05-13 DIAGNOSIS — K2100 Gastro-esophageal reflux disease with esophagitis, without bleeding: Secondary | ICD-10-CM | POA: Diagnosis present

## 2023-05-13 DIAGNOSIS — K449 Diaphragmatic hernia without obstruction or gangrene: Secondary | ICD-10-CM | POA: Diagnosis not present

## 2023-05-13 DIAGNOSIS — E785 Hyperlipidemia, unspecified: Secondary | ICD-10-CM | POA: Insufficient documentation

## 2023-05-13 DIAGNOSIS — J45909 Unspecified asthma, uncomplicated: Secondary | ICD-10-CM | POA: Diagnosis not present

## 2023-05-13 DIAGNOSIS — K219 Gastro-esophageal reflux disease without esophagitis: Secondary | ICD-10-CM

## 2023-05-13 DIAGNOSIS — K2289 Other specified disease of esophagus: Secondary | ICD-10-CM | POA: Insufficient documentation

## 2023-05-13 HISTORY — PX: ESOPHAGOGASTRODUODENOSCOPY (EGD) WITH PROPOFOL: SHX5813

## 2023-05-13 LAB — KOH PREP: KOH Prep: NONE SEEN

## 2023-05-13 SURGERY — ESOPHAGOGASTRODUODENOSCOPY (EGD) WITH PROPOFOL
Anesthesia: General

## 2023-05-13 MED ORDER — SODIUM CHLORIDE 0.9 % IV SOLN: INTRAVENOUS | Status: DC

## 2023-05-13 MED ORDER — PROPOFOL 10 MG/ML IV BOLUS
INTRAVENOUS | Status: DC | PRN
  Administered 2023-05-13 (×2): 30 mg via INTRAVENOUS
  Administered 2023-05-13: 90 mg via INTRAVENOUS

## 2023-05-13 MED ORDER — LIDOCAINE HCL (CARDIAC) PF 100 MG/5ML IV SOSY
PREFILLED_SYRINGE | INTRAVENOUS | Status: DC | PRN
  Administered 2023-05-13: 100 mg via INTRAVENOUS

## 2023-05-13 NOTE — Transfer of Care (Signed)
Immediate Anesthesia Transfer of Care Note  Patient: Cheryl Coleman  Procedure(s) Performed: ESOPHAGOGASTRODUODENOSCOPY (EGD) WITH PROPOFOL  Patient Location: PACU  Anesthesia Type:MAC  Level of Consciousness: drowsy  Airway & Oxygen Therapy: Patient Spontanous Breathing  Post-op Assessment: Report given to RN and Post -op Vital signs reviewed and stable  Post vital signs: stable  Last Vitals:  Vitals Value Taken Time  BP    Temp    Pulse 88 05/13/23 1310  Resp    SpO2 96 % 05/13/23 1310  Vitals shown include unfiled device data.  Last Pain:  Vitals:   05/13/23 1206  TempSrc: Temporal  PainSc: 0-No pain         Complications: No notable events documented.

## 2023-05-13 NOTE — Plan of Care (Signed)
[facility] Tonsillectomy/Adenoidectomy, Postoperative PEDS care plan entered in error.

## 2023-05-13 NOTE — Op Note (Signed)
[facility] Gastroenterology Patient Name: Cheryl Coleman Procedure Date: 05/13/2023 12:53 PM MRN: [id number] Account #: [id number] Date of Birth: Dec 02, 1955 Admit Type: Outpatient Age: 67 Room: [facility] ENDO ROOM 3 Gender: Female Note Status: Finalized Instrument Name: Upper Endoscope [id number] Procedure:             Upper GI endoscopy Indications:           Follow-up of gastro-esophageal reflux disease Providers:             [name] MD, MD Referring MD:          Duncan Dull, MD (Referring MD) Medicines:             General Anesthesia Complications:         No immediate complications. Estimated blood loss: None. Procedure:             Pre-Anesthesia Assessment:                        - Prior to the procedure, a History and Physical was                         performed, and patient medications and allergies were                         reviewed. The patient is competent. The risks and                         benefits of the procedure and the sedation options and                         risks were discussed with the patient. All questions                         were answered and informed consent was obtained.                         Patient identification and proposed procedure were                         verified by the physician, the nurse, the                         anesthesiologist, the anesthetist and the technician                         in the pre-procedure area in the procedure room in the                         endoscopy suite. Mental Status Examination: alert and                         oriented. Airway Examination: normal oropharyngeal                         airway and neck mobility. Respiratory Examination:                         clear to auscultation. CV Examination: normal.  Prophylactic Antibiotics: The patient does not require                         prophylactic antibiotics. Prior Anticoagulants: The                          patient has taken no anticoagulant or antiplatelet                         agents. ASA Grade Assessment: III - A patient with                         severe systemic disease. After reviewing the risks and                         benefits, the patient was deemed in satisfactory                         condition to undergo the procedure. The anesthesia                         plan was to use general anesthesia. Immediately prior                         to administration of medications, the patient was                         re-assessed for adequacy to receive sedatives. The                         heart rate, respiratory rate, oxygen saturations,                         blood pressure, adequacy of pulmonary ventilation, and                         response to care were monitored throughout the                         procedure. The physical status of the patient was                         re-assessed after the procedure.                        After obtaining informed consent, the endoscope was                         passed under direct vision. Throughout the procedure,                         the patient's blood pressure, pulse, and oxygen                         saturations were monitored continuously. The Endoscope                         was introduced through the mouth, and advanced to the  second part of duodenum. The upper GI endoscopy was                         accomplished without difficulty. The patient tolerated                         the procedure well. Findings:      The duodenal bulb and second portion of the duodenum were normal.      The entire examined stomach was normal.      The cardia and gastric fundus were normal on retroflexion.      Esophagogastric landmarks were identified: the gastroesophageal junction       was found at 38 cm from the incisors.      White nummular lesions were noted in the upper third of the esophagus.        samples were obtained by brushing to rule out candida. Estimated blood       loss: none. Impression:            - Normal duodenal bulb and second portion of the                         duodenum.                        - Normal stomach.                        - Esophagogastric landmarks identified.                        - White nummular lesions in esophageal mucosa. Cells                         for cytology obtained. Recommendation:        - Discharge patient to home (with escort).                        - Resume previous diet today.                        - Continue present medications.                        - Follow an antireflux regimen indefinitely. Procedure Code(s):     --- Professional ---                        [id number], Esophagogastroduodenoscopy, flexible,                         transoral; diagnostic, including collection of                         specimen(s) by brushing or washing, when performed                         (separate procedure) Diagnosis Code(s):     --- Professional ---                        K22.89, Other specified disease of esophagus  K21.9, Gastro-esophageal reflux disease without                         esophagitis CPT copyright 2022 American Medical Association. All rights reserved. The codes documented in this report are preliminary and upon coder review may  be revised to meet current compliance requirements. Dr. Libby Maw [name] MD, MD 05/13/2023 1:09:10 PM This report has been signed electronically. Number of Addenda: 0 Note Initiated On: 05/13/2023 12:53 PM Estimated Blood Loss:  Estimated blood loss: none.      [facility]

## 2023-05-13 NOTE — H&P (Signed)
[name], MD [address]  [address]  [place], [place] [address]  Main: [phone]  Fax: [phone] Pager: [phone]  Primary Care Physician:  [name], MD Primary Gastroenterologist:  Dr. [name]  Pre-Procedure History & Physical: HPI:  Cheryl Coleman is a 67 y.o. female is here for an endoscopy.   Past Medical History:  Diagnosis Date   Acute blood loss anemia    Arthritis    Asthma    COVID    COVID-19    07/24/20, 05/19/21   Diverticulitis    Diverticulosis of colon with hemorrhage    GERD (gastroesophageal reflux disease)    GIB (gastrointestinal bleeding)    diverticular 01/23/22-01/24/22 [facility] s/p emobolization & 15 years ago in [place]   Hiatal hernia    History of blood transfusion    History of chicken pox    History of GI bleed    2013/14   Hyperlipidemia    Hypertension    Hypovolemic shock (HCC) 04/06/2022   Leukocytosis    Type O blood, Rh positive     Past Surgical History:  Procedure Laterality Date   BREAST CYST ASPIRATION Right    CESAREAN SECTION     04/27/84   COLONOSCOPY     COLONOSCOPY WITH PROPOFOL N/A 02/27/2022   Procedure: COLONOSCOPY WITH PROPOFOL;  Surgeon: [name], MD;  Location: [facility] ENDOSCOPY;  Service: Gastroenterology;  Laterality: N/A;   EMBOLIZATION (CATH LAB) N/A 01/23/2022   Procedure: EMBOLIZATION;  Surgeon: [name], MD;  Location: [facility] INVASIVE CV LAB;  Service: Cardiovascular;  Laterality: N/A;   EMBOLIZATION (CATH LAB) N/A 04/06/2022   Procedure: EMBOLIZATION;  Surgeon: Annice Needy, MD;  Location: [facility] INVASIVE CV LAB;  Service: Cardiovascular;  Laterality: N/A;   ESOPHAGOGASTRODUODENOSCOPY (EGD) WITH PROPOFOL N/A 08/29/2020   Procedure: ESOPHAGOGASTRODUODENOSCOPY (EGD) WITH PROPOFOL;  Surgeon: [name], MD;  Location: [facility] ENDOSCOPY;  Service: Gastroenterology;  Laterality: N/A;   IR ANGIOGRAM VISCERAL SELECTIVE  04/05/2022   IR ANGIOGRAM VISCERAL SELECTIVE  04/05/2022   IR  EMBO ART  VEN HEMORR LYMPH EXTRAV  INC GUIDE ROADMAPPING  04/05/2022   IR US GUIDE VASC ACCESS RIGHT  04/05/2022   ORIF ACETABULAR FRACTURE      Prior to Admission medications   Medication Sig Start Date End Date Taking? Authorizing Provider  atorvastatin (LIPITOR) 10 MG tablet Take 1 tablet (10 mg total) by mouth daily at 6 PM. 09/29/22  Yes [name], MD  citalopram (CELEXA) 10 MG tablet Take 1 tablet (10 mg total) by mouth daily. 09/29/22  Yes [name], MD  diltiazem (CARDIZEM CD) 120 MG 24 hr capsule Take 1 capsule (120 mg total) by mouth 2 (two) times daily. 05/12/22  Yes Agbor-Etang, Arlys John, MD  losartan (COZAAR) 25 MG tablet TAKE 1 TABLET (25 MG TOTAL) BY MOUTH DAILY. 12/28/22  Yes [name], MD  montelukast (SINGULAIR) 10 MG tablet Take 1 tablet (10 mg total) by mouth at bedtime. 12/28/22  Yes [name], MD  omeprazole (PRILOSEC) 40 MG capsule Take 1 capsule (40 mg total) by mouth 2 (two) times daily. 09/29/22  Yes [name], MD  albuterol (VENTOLIN HFA) 108 (90 Base) MCG/ACT inhaler INHALE 1-2 PUFFS BY MOUTH EVERY 6 (SIX) HOURS AS NEEDED 02/02/23   [name], MD  estradiol (ESTRACE) 0.1 MG/GM vaginal cream Estrogen Cream Instruction Discard applicator Apply pea sized amount to tip of finger to urethra before bed. Wash hands  well after application. Use Monday, Wednesday and Friday 04/21/23   Vanna Scotland, MD  fluticasone [facility]) 50 MCG/ACT nasal spray SPRAY 2 SPRAYS INTO EACH NOSTRIL EVERY DAY Patient taking differently: Place 2 sprays into both nostrils daily as needed for allergies or rhinitis. 09/09/21   [name], [name], MD  Specialty Vitamins Products (WOMENS VITA PAK PO) Take by mouth daily.    [provider]  sulfamethoxazole-trimethoprim (BACTRIM DS) 800-160 MG tablet Take 1 tablet by mouth 2 (two) times daily. 04/26/23   Vanna Scotland, MD  Vibegron (GEMTESA) 75 MG TABS Take 1 tablet (75 mg total) by mouth daily. 04/21/23   Vanna Scotland, MD    Allergies as of 04/06/2023 - Review Complete 03/06/2023  Allergen Reaction Noted   Oxycodone Other (See Comments) 08/18/2018    Family History  Problem Relation Age of Onset   Hypertension Maternal Aunt    Diabetes Maternal Aunt    Breast cancer Neg Hx     Social History   Socioeconomic History   Marital status: Married    Spouse name: Not on file   Number of children: Not on file   Years of education: Not on file   Highest education level: Not on file  Occupational History   Not on file  Tobacco Use   Smoking status: Never   Smokeless tobacco: Never  Vaping Use   Vaping status: Never Used  Substance and Sexual Activity   Alcohol use: Not Currently   Drug use: Not Currently   Sexual activity: Yes    Comment: husband   Other Topics Concern   Not on file  Social History Narrative   Lived in [place] from [place]    Married 1st husband died in late 90s/2000s   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, wears seat belt, safe in relationship    Social Determinants of Health   Financial Resource Strain: Not on file  Food Insecurity: No Food Insecurity (04/06/2022)   Hunger Vital Sign    Worried About Running Out of Food in the Last Year: Never true    Ran Out of Food in the Last Year: Never true  Transportation Needs: No Transportation Needs (04/06/2022)   PRAPARE - Administrator, Civil Service (Medical): No    Lack of Transportation (Non-Medical): No  Physical Activity: Not on file  Stress: Not on file  Social Connections: Not on file  Intimate Partner Violence: Not At Risk (04/06/2022)   Humiliation, Afraid, Rape, and Kick questionnaire    Fear of Current or Ex-Partner: No    Emotionally Abused: No    Physically Abused: No    Sexually Abused: No    Review of Systems: See HPI, otherwise negative ROS  Physical Exam: BP (!) 151/83   Pulse 84   Temp (!) 96.4 F (35.8 C) (Temporal)   Resp 16   Ht 5\' 4"  (1.626 m)   Wt 100.7 kg   SpO2 99%   BMI  38.11 kg/m  General:   Alert,  pleasant and cooperative in NAD Head:  Normocephalic and atraumatic. Neck:  Supple; no masses or thyromegaly. Lungs:  Clear throughout to auscultation.    Heart:  Regular rate and rhythm. Abdomen:  Soft, nontender and nondistended. Normal bowel sounds, without guarding, and without rebound.   Neurologic:  Alert and  oriented x4;  grossly normal neurologically.  Impression/Plan: Cheryl Coleman is here for an endoscopy to be performed for chronic GERD  Risks, benefits, limitations, and alternatives regarding  endoscopy have been reviewed with the patient.  Questions have been answered.  All parties agreeable.   Lannette Donath, MD  05/13/2023, 12:10 PM

## 2023-05-13 NOTE — Anesthesia Preprocedure Evaluation (Signed)
Anesthesia Evaluation  Patient identified by MRN, date of birth, ID band Patient awake    Reviewed: Allergy & Precautions, NPO status , Patient's Chart, lab work & pertinent test results  History of Anesthesia Complications Negative for: history of anesthetic complications  Airway Mallampati: III  TM Distance: >3 FB Neck ROM: full    Dental  (+) Chipped   Pulmonary neg shortness of breath, asthma    Pulmonary exam normal        Cardiovascular Exercise Tolerance: Good hypertension, Normal cardiovascular exam     Neuro/Psych  Neuromuscular disease  negative psych ROS   GI/Hepatic Neg liver ROS, hiatal hernia,GERD  Controlled,,  Endo/Other  negative endocrine ROS    Renal/GU negative Renal ROS  negative genitourinary   Musculoskeletal   Abdominal   Peds  Hematology negative hematology ROS (+)   Anesthesia Other Findings Past Medical History: No date: Acute blood loss anemia No date: Arthritis No date: Asthma No date: COVID No date: COVID-19     Comment:  07/24/20, 05/19/21 No date: Diverticulitis No date: Diverticulosis of colon with hemorrhage No date: GERD (gastroesophageal reflux disease) No date: GIB (gastrointestinal bleeding)     Comment:  diverticular 01/23/22-01/24/22 [facility] s/p emobolization & 15               years ago in [place] No date: Hiatal hernia No date: History of blood transfusion No date: History of chicken pox No date: History of GI bleed     Comment:  2013/14 No date: Hyperlipidemia No date: Hypertension 04/06/2022: Hypovolemic shock (HCC) No date: Leukocytosis No date: Type O blood, Rh positive  Past Surgical History: No date: BREAST CYST ASPIRATION; Right No date: CESAREAN SECTION     Comment:  04/27/84 No date: COLONOSCOPY 02/27/2022: COLONOSCOPY WITH PROPOFOL; N/A     Comment:  Procedure: COLONOSCOPY WITH PROPOFOL;  Surgeon: [name], MD;  Location: [facility]  ENDOSCOPY;  Service:               Gastroenterology;  Laterality: N/A; 01/23/2022: EMBOLIZATION (CATH LAB); N/A     Comment:  Procedure: EMBOLIZATION;  Surgeon: [name],               MD;  Location: [facility] INVASIVE CV LAB;  Service:               Cardiovascular;  Laterality: N/A; 04/06/2022: EMBOLIZATION (CATH LAB); N/A     Comment:  Procedure: EMBOLIZATION;  Surgeon: Annice Needy, MD;                Location: [facility] INVASIVE CV LAB;  Service: Cardiovascular;              Laterality: N/A; 08/29/2020: ESOPHAGOGASTRODUODENOSCOPY (EGD) WITH PROPOFOL; N/A     Comment:  Procedure: ESOPHAGOGASTRODUODENOSCOPY (EGD) WITH               PROPOFOL;  Surgeon: [name], MD;  Location:               [facility] ENDOSCOPY;  Service: Gastroenterology;  Laterality:               N/A; 04/05/2022: IR ANGIOGRAM VISCERAL SELECTIVE 04/05/2022: IR ANGIOGRAM VISCERAL SELECTIVE 04/05/2022: IR EMBO ART  VEN HEMORR LYMPH EXTRAV  INC GUIDE ROADMAPPING 04/05/2022: IR US GUIDE VASC ACCESS RIGHT No date: ORIF ACETABULAR FRACTURE  BMI    Body Mass Index: 38.11 kg/m  Reproductive/Obstetrics negative OB ROS                             Anesthesia Physical Anesthesia Plan  ASA: 3  Anesthesia Plan: General   Post-op Pain Management:    Induction: Intravenous  PONV Risk Score and Plan: Propofol infusion and TIVA  Airway Management Planned: Natural Airway and Nasal Cannula  Additional Equipment:   Intra-op Plan:   Post-operative Plan:   Informed Consent: I have reviewed the patients History and Physical, chart, labs and discussed the procedure including the risks, benefits and alternatives for the proposed anesthesia with the patient or authorized representative who has indicated his/her understanding and acceptance.     Dental Advisory Given  Plan Discussed with: Anesthesiologist, CRNA and Surgeon  Anesthesia Plan Comments: (Patient consented for risks of anesthesia  including but not limited to:  - adverse reactions to medications - risk of airway placement if required - damage to eyes, teeth, lips or other oral mucosa - nerve damage due to positioning  - sore throat or hoarseness - Damage to heart, brain, nerves, lungs, other parts of body or loss of life  Patient voiced understanding and assent.)       Anesthesia Quick Evaluation

## 2023-05-14 ENCOUNTER — Encounter: Payer: Self-pay | Admitting: Gastroenterology

## 2023-05-14 ENCOUNTER — Telehealth: Payer: Self-pay | Admitting: Gastroenterology

## 2023-05-14 MED ORDER — SUCRALFATE 1 G PO TABS: 1.0000 g | ORAL_TABLET | Freq: Three times a day (TID) | ORAL | 0 refills | Status: DC

## 2023-05-14 NOTE — Anesthesia Postprocedure Evaluation (Signed)
Anesthesia Post Note  Patient: Careers adviser  Procedure(s) Performed: ESOPHAGOGASTRODUODENOSCOPY (EGD) WITH PROPOFOL  Patient location during evaluation: Endoscopy Anesthesia Type: General Level of consciousness: awake and alert Pain management: pain level controlled Vital Signs Assessment: post-procedure vital signs reviewed and stable Respiratory status: spontaneous breathing, nonlabored ventilation, respiratory function stable and patient connected to nasal cannula oxygen Cardiovascular status: blood pressure returned to baseline and stable Postop Assessment: no apparent nausea or vomiting Anesthetic complications: no   No notable events documented.   Last Vitals:  Vitals:   05/13/23 1321 05/13/23 1333  BP: 129/75 128/81  Pulse: 80 81  Resp:    Temp:    SpO2: 100%     Last Pain:  Vitals:   05/13/23 1321  TempSrc:   PainSc: 0-No pain                 Cleda Mccreedy Jemari Hallum

## 2023-05-14 NOTE — Telephone Encounter (Signed)
Patient called and left a voicemail wanting to know if the medicine that was sent in to her pharmacy will it help. This is her other question. Is this to treat the white lesions that she saw in the esophagus?

## 2023-05-14 NOTE — Telephone Encounter (Signed)
Patient also sent mychart message so sent mychart message to provider

## 2023-05-25 ENCOUNTER — Ambulatory Visit: Payer: Medicare HMO | Admitting: Physician Assistant

## 2023-05-25 ENCOUNTER — Encounter: Payer: Self-pay | Admitting: Urology

## 2023-06-01 ENCOUNTER — Encounter: Payer: Self-pay | Admitting: Oncology

## 2023-06-10 ENCOUNTER — Other Ambulatory Visit: Payer: Self-pay | Admitting: Gastroenterology

## 2023-06-10 NOTE — Telephone Encounter (Signed)
Last office visit 02/25/2023 Chronic GERD  Last EGD was 05/13/2023  Was given Carafate on 05/14/2023 after EGD 0 refills

## 2023-06-15 ENCOUNTER — Encounter: Payer: Medicare HMO | Admitting: Internal Medicine

## 2023-06-18 ENCOUNTER — Other Ambulatory Visit: Payer: Self-pay | Admitting: Internal Medicine

## 2023-06-18 DIAGNOSIS — I10 Essential (primary) hypertension: Secondary | ICD-10-CM

## 2023-06-29 ENCOUNTER — Other Ambulatory Visit: Payer: Self-pay

## 2023-06-29 DIAGNOSIS — I10 Essential (primary) hypertension: Secondary | ICD-10-CM

## 2023-06-29 MED ORDER — DILTIAZEM HCL ER COATED BEADS 120 MG PO CP24: 120.0000 mg | ORAL_CAPSULE | Freq: Two times a day (BID) | ORAL | 0 refills | Status: DC

## 2023-06-29 NOTE — Telephone Encounter (Signed)
Last visit 05/11/22 with plan to f/u in 12 months.    Next visit: none/active recall  Please schedule follow up appt, Thaks!

## 2023-06-29 NOTE — Telephone Encounter (Signed)
Requested Prescriptions   Signed Prescriptions Disp Refills   diltiazem (CARDIZEM CD) 120 MG 24 hr capsule 60 capsule 0    Sig: Take 1 capsule (120 mg total) by mouth 2 (two) times daily. Due for yearly visit.  PLEASE CALL OFFICE TO SCHEDULE APPOINTMENT PRIOR TO NEXT REFILL (first attempt)    Authorizing Provider: Debbe Odea    Ordering User: Guerry Minors

## 2023-06-30 ENCOUNTER — Ambulatory Visit: Payer: Medicare HMO

## 2023-07-03 ENCOUNTER — Ambulatory Visit
Admission: RE | Admit: 2023-07-03 | Discharge: 2023-07-03 | Disposition: A | Discharge status: Home / Self Care | Payer: Self-pay | Source: Ambulatory Visit | Attending: Emergency Medicine | Admitting: Emergency Medicine

## 2023-07-03 VITALS — BP 116/78 | HR 91 | Temp 98.4°F | Resp 18

## 2023-07-03 DIAGNOSIS — R300 Dysuria: Secondary | ICD-10-CM | POA: Diagnosis not present

## 2023-07-03 DIAGNOSIS — N390 Urinary tract infection, site not specified: Secondary | ICD-10-CM | POA: Insufficient documentation

## 2023-07-03 DIAGNOSIS — N3000 Acute cystitis without hematuria: Secondary | ICD-10-CM | POA: Diagnosis not present

## 2023-07-03 LAB — POCT URINALYSIS DIP (MANUAL ENTRY)
Bilirubin, UA: NEGATIVE
Glucose, UA: NEGATIVE mg/dL
Ketones, POC UA: NEGATIVE mg/dL
Nitrite, UA: NEGATIVE
Protein Ur, POC: NEGATIVE mg/dL
Spec Grav, UA: <=1.005 — AB
Urobilinogen, UA: 0.2 U/dL
pH, UA: 5.5

## 2023-07-03 MED ORDER — SULFAMETHOXAZOLE-TRIMETHOPRIM 800-160 MG PO TABS: 1.0000 | ORAL_TABLET | Freq: Two times a day (BID) | ORAL | 0 refills | Status: AC

## 2023-07-03 NOTE — Discharge Instructions (Addendum)
Take the antibiotic as directed.  The urine culture is pending.  We will call you if it shows the need to change or discontinue your antibiotic.    Follow up with your primary care provider or urologist.    

## 2023-07-03 NOTE — ED Provider Notes (Signed)
Cheryl Coleman    CSN: [id number] Arrival date & time: 07/03/23  1205      History   Chief Complaint Chief Complaint  Patient presents with   Urinary Frequency    Pain on urination - Entered by patient   Dysuria    HPI Cheryl Coleman is a 67 y.o. female.  Patient presents with 1 week history of dysuria and urinary frequency.  No fever, abdominal pain, flank pain, hematuria.  No OTC medication taken today.  Patient was seen by urology on 04/21/2023; diagnosed with acute cystitis and overactive bladder; treated with estradiol cream and Gemtesa; then based on urine culture done at that visit, treated with Bactrim DS on 04/26/2023.  She was supposed to follow-up with urology on 05/25/2023 but had to reschedule due to starting a new job.  The history is provided by the patient and medical records.    Past Medical History:  Diagnosis Date   Acute blood loss anemia    Arthritis    Asthma    COVID    COVID-19    07/24/20, 05/19/21   Diverticulitis    Diverticulosis of colon with hemorrhage    GERD (gastroesophageal reflux disease)    GIB (gastrointestinal bleeding)    diverticular 01/23/22-01/24/22 [facility] s/p emobolization & 15 years ago in [place]   Hiatal hernia    History of blood transfusion    History of chicken pox    History of GI bleed    2013/14   Hyperlipidemia    Hypertension    Hypovolemic shock (HCC) 04/06/2022   Leukocytosis    Type O blood, Rh positive     Patient Active Problem List   Diagnosis Date Noted   Chronic GERD 05/13/2023   Oral thrush 02/26/2023   Otitis media, unspecified, right ear 01/25/2023   Edema of right lower leg due to venous stasis 01/25/2023   Eczema of external ear, bilateral 01/25/2023   UTI due to Klebsiella species 01/20/2023   History of iron deficiency anemia 09/29/2022   Iron deficiency anemia 07/06/2022   Hypomagnesemia    Sigmoid diverticulosis    External hemorrhoids    History of GI diverticular bleed 01/23/2022   BMI  38.0-38.9,adult 01/14/2022   Gallstones 10/24/2020   Osteopenia 05/09/2020   Onychomycosis 11/03/2019   Asthma 08/29/2019   Hiatal hernia 06/06/2019   Anxiety 11/25/2018   Palpitations 10/19/2018   Vitamin D deficiency 10/06/2018   Prediabetes 10/06/2018   Essential hypertension 08/18/2018   Gastroesophageal reflux disease 08/18/2018   Hyperlipidemia 08/18/2018   Mild intermittent asthma 08/18/2018   Allergic rhinitis due to pollen 05/11/2014    Past Surgical History:  Procedure Laterality Date   BREAST CYST ASPIRATION Right    CESAREAN SECTION     04/27/84   COLONOSCOPY     COLONOSCOPY WITH PROPOFOL N/A 02/27/2022   Procedure: COLONOSCOPY WITH PROPOFOL;  Surgeon: [name], MD;  Location: [facility] ENDOSCOPY;  Service: Gastroenterology;  Laterality: N/A;   EMBOLIZATION (CATH LAB) N/A 01/23/2022   Procedure: EMBOLIZATION;  Surgeon: [name], MD;  Location: [facility] INVASIVE CV LAB;  Service: Cardiovascular;  Laterality: N/A;   EMBOLIZATION (CATH LAB) N/A 04/06/2022   Procedure: EMBOLIZATION;  Surgeon: Annice Needy, MD;  Location: [facility] INVASIVE CV LAB;  Service: Cardiovascular;  Laterality: N/A;   ESOPHAGOGASTRODUODENOSCOPY (EGD) WITH PROPOFOL N/A 08/29/2020   Procedure: ESOPHAGOGASTRODUODENOSCOPY (EGD) WITH PROPOFOL;  Surgeon: [name], MD;  Location: [facility] ENDOSCOPY;  Service: Gastroenterology;  Laterality: N/A;  ESOPHAGOGASTRODUODENOSCOPY (EGD) WITH PROPOFOL N/A 05/13/2023   Procedure: ESOPHAGOGASTRODUODENOSCOPY (EGD) WITH PROPOFOL;  Surgeon: [name], MD;  Location: [facility] ENDOSCOPY;  Service: Endoscopy;  Laterality: N/A;   IR ANGIOGRAM VISCERAL SELECTIVE  04/05/2022   IR ANGIOGRAM VISCERAL SELECTIVE  04/05/2022   IR EMBO ART  VEN HEMORR LYMPH EXTRAV  INC GUIDE ROADMAPPING  04/05/2022   IR US GUIDE VASC ACCESS RIGHT  04/05/2022   ORIF ACETABULAR FRACTURE      OB History   No obstetric history on file.      Home Medications    Prior to Admission  medications   Medication Sig Start Date End Date Taking? Authorizing Provider  sulfamethoxazole-trimethoprim (BACTRIM DS) 800-160 MG tablet Take 1 tablet by mouth 2 (two) times daily for 7 days. 07/03/23 07/10/23 Yes Mickie Bail, NP  albuterol (VENTOLIN HFA) 108 (90 Base) MCG/ACT inhaler INHALE 1-2 PUFFS BY MOUTH EVERY 6 (SIX) HOURS AS NEEDED 02/02/23   [name], MD  atorvastatin (LIPITOR) 10 MG tablet Take 1 tablet (10 mg total) by mouth daily at 6 PM. 09/29/22   [name], MD  citalopram (CELEXA) 10 MG tablet Take 1 tablet (10 mg total) by mouth daily. 09/29/22   [name], MD  diltiazem (CARDIZEM CD) 120 MG 24 hr capsule Take 1 capsule (120 mg total) by mouth 2 (two) times daily. Due for yearly visit.  PLEASE CALL OFFICE TO SCHEDULE APPOINTMENT PRIOR TO NEXT REFILL (first attempt) 06/29/23   Debbe Odea, MD  estradiol (ESTRACE) 0.1 MG/GM vaginal cream Estrogen Cream Instruction Discard applicator Apply pea sized amount to tip of finger to urethra before bed. Wash hands well after application. Use Monday, Wednesday and Friday 04/21/23   Vanna Scotland, MD  fluticasone [facility]) 50 MCG/ACT nasal spray SPRAY 2 SPRAYS INTO EACH NOSTRIL EVERY DAY Patient taking differently: Place 2 sprays into both nostrils daily as needed for allergies or rhinitis. 09/09/21   [name], [name], MD  losartan (COZAAR) 25 MG tablet Take 1 tablet (25 mg total) by mouth daily. Needs appt with PCP for additional refills 06/21/23   [name], MD  montelukast (SINGULAIR) 10 MG tablet Take 1 tablet (10 mg total) by mouth at bedtime. 12/28/22   [name], MD  omeprazole (PRILOSEC) 40 MG capsule Take 1 capsule (40 mg total) by mouth 2 (two) times daily. 09/29/22   [name], MD  Specialty Vitamins Products (WOMENS VITA PAK PO) Take by mouth daily.    [provider]  sucralfate (CARAFATE) 1 g tablet TAKE 1 TABLET BY MOUTH 4 TIMES A DAY (WITH MEALS & AT BEDTIME) AS NEEDED  06/10/23   Vanga, Loel Dubonnet, MD  Vibegron (GEMTESA) 75 MG TABS Take 1 tablet (75 mg total) by mouth daily. 04/21/23   Vanna Scotland, MD    Family History Family History  Problem Relation Age of Onset   Hypertension Maternal Aunt    Diabetes Maternal Aunt    Breast cancer Neg Hx     Social History Social History   Tobacco Use   Smoking status: Never   Smokeless tobacco: Never  Vaping Use   Vaping status: Never Used  Substance Use Topics   Alcohol use: Not Currently   Drug use: Not Currently     Allergies   Oxycodone   Review of Systems Review of Systems  Constitutional:  Negative for chills and fever.  Gastrointestinal:  Negative for abdominal pain and nausea.  Genitourinary:  Positive for  dysuria and frequency. Negative for flank pain and hematuria.     Physical Exam Triage Vital Signs ED Triage Vitals [07/03/23 1228]  Encounter Vitals Group     BP      Systolic BP Percentile      Diastolic BP Percentile      Pulse Rate 91     Resp 18     Temp 98.4 F (36.9 C)     Temp src      SpO2 96 %     Weight      Height      Head Circumference      Peak Flow      Pain Score      Pain Loc      Pain Education      Exclude from Growth Chart    No data found.  Updated Vital Signs BP 116/78   Pulse 91   Temp 98.4 F (36.9 C)   Resp 18   SpO2 96%   Visual Acuity Right Eye Distance:   Left Eye Distance:   Bilateral Distance:    Right Eye Near:   Left Eye Near:    Bilateral Near:     Physical Exam Constitutional:      General: She is not in acute distress. HENT:     Mouth/Throat:     Mouth: Mucous membranes are moist.  Cardiovascular:     Rate and Rhythm: Normal rate and regular rhythm.  Pulmonary:     Effort: Pulmonary effort is normal. No respiratory distress.  Abdominal:     General: Bowel sounds are normal.     Palpations: Abdomen is soft.     Tenderness: There is no abdominal tenderness. There is no right CVA tenderness, left CVA  tenderness, guarding or rebound.  Skin:    General: Skin is warm and dry.  Neurological:     Mental Status: She is alert.      UC Treatments / Results  Labs (all labs ordered are listed, but only abnormal results are displayed) Labs Reviewed  POCT URINALYSIS DIP (MANUAL ENTRY) - Abnormal; Notable for the following components:      Result Value   Color, UA light yellow (*)    Clarity, UA turbid (*)    Spec Grav, UA <=1.005 (*)    Blood, UA trace-intact (*)    Leukocytes, UA Large (3+) (*)    All other components within normal limits  URINE CULTURE    EKG   Radiology No results found.  Procedures Procedures (including critical care time)  Medications Ordered in UC Medications - No data to display  Initial Impression / Assessment and Plan / UC Course  I have reviewed the triage vital signs and the nursing notes.  Pertinent labs & imaging results that were available during my care of the patient were reviewed by me and considered in my medical decision making (see chart for details).    Dysuria, Acute cystitis without hematuria, recurrent UTI.  Based on urology appointment and recent urine cultures, treating with 7 day course of Bactrim. Urine culture pending. Discussed with patient that we will call her if the urine culture shows the need to change or discontinue the antibiotic. Instructed her to follow-up with her PCP or urologist. Patient agrees to plan of care.     Final Clinical Impressions(s) / UC Diagnoses   Final diagnoses:  Dysuria  Acute cystitis without hematuria  Recurrent UTI     Discharge Instructions  Take the antibiotic as directed.  The urine culture is pending.  We will call you if it shows the need to change or discontinue your antibiotic.    Follow-up with your primary care provider or urologist.      ED Prescriptions     Medication Sig Dispense Auth. Provider   sulfamethoxazole-trimethoprim (BACTRIM DS) 800-160 MG tablet Take 1  tablet by mouth 2 (two) times daily for 7 days. 14 tablet Mickie Bail, NP      PDMP not reviewed this encounter.   Mickie Bail, NP 07/03/23 1256

## 2023-07-03 NOTE — ED Triage Notes (Addendum)
Patient to Urgent Care with complaints of dysuria/ painful urination/ urinary frequency. Denies any known fevers.   Symptoms started approx 1 week ago.  Hx of UTIS- reports bactrim works well for her.

## 2023-07-05 LAB — URINE CULTURE: Culture: >=100000 — AB

## 2023-07-06 NOTE — Telephone Encounter (Signed)
Left voicemail to return call to schedule follow up.

## 2023-07-07 NOTE — Telephone Encounter (Signed)
Appointment is scheduled for 09/03/23

## 2023-07-20 ENCOUNTER — Ambulatory Visit: Payer: Medicare HMO | Admitting: Physician Assistant

## 2023-07-27 ENCOUNTER — Other Ambulatory Visit: Payer: Self-pay

## 2023-07-27 DIAGNOSIS — I10 Essential (primary) hypertension: Secondary | ICD-10-CM

## 2023-07-27 MED ORDER — DILTIAZEM HCL ER COATED BEADS 120 MG PO CP24: 120.0000 mg | ORAL_CAPSULE | Freq: Two times a day (BID) | ORAL | 0 refills | Status: DC

## 2023-07-27 NOTE — Telephone Encounter (Signed)
 Requested Prescriptions   Signed Prescriptions Disp Refills   diltiazem  (CARDIZEM  CD) 120 MG 24 hr capsule 60 capsule 0    Sig: Take 1 capsule (120 mg total) by mouth 2 (two) times daily. Keep follow up scheduled visit for future refills.    Authorizing Provider: DARLISS ROGUE    Ordering User: CLAUDENE POWELL CROME   Last visit 05/11/22 with plan to f/u in 12 months.     Next visit: 09/03/23

## 2023-07-29 ENCOUNTER — Ambulatory Visit: Payer: Medicare HMO | Admitting: Physician Assistant

## 2023-07-29 VITALS — BP 141/83 | HR 89 | Ht 64.0 in | Wt 225.5 lb

## 2023-07-29 DIAGNOSIS — N3281 Overactive bladder: Secondary | ICD-10-CM

## 2023-07-29 DIAGNOSIS — N390 Urinary tract infection, site not specified: Secondary | ICD-10-CM

## 2023-07-29 DIAGNOSIS — Z87440 Personal history of urinary (tract) infections: Secondary | ICD-10-CM

## 2023-07-29 MED ORDER — MIRABEGRON ER 50 MG PO TB24: 50.0000 mg | ORAL_TABLET | Freq: Every day | ORAL | 11 refills | Status: DC

## 2023-07-29 NOTE — Progress Notes (Signed)
 07/29/2023 3:58 PM   Cheryl Coleman [birth year] [id number]  CC: Chief Complaint  Patient presents with   Over Active Bladder   HPI: Cheryl Coleman is a 68 y.o. female with PMH chronic constipation, OAB wet, and recurrent UTI who presents today for symptom recheck on Gemtesa  and estrogen cream.   Today she reports significant symptomatic improvement on Gemtesa  with decreased urgency, frequency, and near resolution of urge incontinence.  She ran out of samples a couple of weeks ago.  Unfortunately, her insurance will not cover it.  She has been using estrogen cream, though inconsistently.  PMH: Past Medical History:  Diagnosis Date   Acute blood loss anemia    Arthritis    Asthma    COVID    COVID-19    07/24/20, 05/19/21   Diverticulitis    Diverticulosis of colon with hemorrhage    GERD (gastroesophageal reflux disease)    GIB (gastrointestinal bleeding)    diverticular 01/23/22-01/24/22 [facility] s/p emobolization & 15 years ago in [place]   Hiatal hernia    History of blood transfusion    History of chicken pox    History of GI bleed    2013/14   Hyperlipidemia    Hypertension    Hypovolemic shock (HCC) 04/06/2022   Leukocytosis    Type O blood, Rh positive     Surgical History: Past Surgical History:  Procedure Laterality Date   BREAST CYST ASPIRATION Right    CESAREAN SECTION     04/27/84   COLONOSCOPY     COLONOSCOPY WITH PROPOFOL  N/A 02/27/2022   Procedure: COLONOSCOPY WITH PROPOFOL ;  Surgeon: Unk Corinn Skiff, MD;  Location: [facility] ENDOSCOPY;  Service: Gastroenterology;  Laterality: N/A;   EMBOLIZATION (CATH LAB) N/A 01/23/2022   Procedure: EMBOLIZATION;  Surgeon: Jama Cordella MATSU, MD;  Location: [facility] INVASIVE CV LAB;  Service: Cardiovascular;  Laterality: N/A;   EMBOLIZATION (CATH LAB) N/A 04/06/2022   Procedure: EMBOLIZATION;  Surgeon: Marea Selinda RAMAN, MD;  Location: [facility] INVASIVE CV LAB;  Service: Cardiovascular;  Laterality: N/A;   ESOPHAGOGASTRODUODENOSCOPY (EGD) WITH  PROPOFOL  N/A 08/29/2020   Procedure: ESOPHAGOGASTRODUODENOSCOPY (EGD) WITH PROPOFOL ;  Surgeon: Unk Corinn Skiff, MD;  Location: [facility] ENDOSCOPY;  Service: Gastroenterology;  Laterality: N/A;   ESOPHAGOGASTRODUODENOSCOPY (EGD) WITH PROPOFOL  N/A 05/13/2023   Procedure: ESOPHAGOGASTRODUODENOSCOPY (EGD) WITH PROPOFOL ;  Surgeon: Unk Corinn Skiff, MD;  Location: [facility] ENDOSCOPY;  Service: Endoscopy;  Laterality: N/A;   IR ANGIOGRAM VISCERAL SELECTIVE  04/05/2022   IR ANGIOGRAM VISCERAL SELECTIVE  04/05/2022   IR EMBO ART  VEN HEMORR LYMPH EXTRAV  INC GUIDE ROADMAPPING  04/05/2022   IR US  GUIDE VASC ACCESS RIGHT  04/05/2022   ORIF ACETABULAR FRACTURE      Home Medications:  Allergies as of 07/29/2023       Reactions   Oxycodone Other (See Comments)   Feels bad        Medication List        Accurate as of July 29, 2023  3:58 PM. If you have any questions, ask your nurse or doctor.          STOP taking these medications    sucralfate  1 g tablet Commonly known as: CARAFATE        TAKE these medications    albuterol  108 (90 Base) MCG/ACT inhaler Commonly known as: VENTOLIN  HFA INHALE 1-2 PUFFS BY MOUTH EVERY 6 (SIX) HOURS AS NEEDED   atorvastatin  10 MG tablet Commonly known as: LIPITOR Take 1 tablet (10 mg total) by mouth daily  at 6 PM.   citalopram  10 MG tablet Commonly known as: CeleXA  Take 1 tablet (10 mg total) by mouth daily.   diltiazem  120 MG 24 hr capsule Commonly known as: Cardizem  CD Take 1 capsule (120 mg total) by mouth 2 (two) times daily. Keep follow up scheduled visit for future refills.   estradiol  0.1 MG/GM vaginal cream Commonly known as: ESTRACE  Estrogen Cream Instruction Discard applicator Apply pea sized amount to tip of finger to urethra before bed. Wash hands well after application. Use Monday, Wednesday and Friday   fluticasone  50 MCG/ACT nasal spray Commonly known as: FLONASE  SPRAY 2 SPRAYS INTO EACH NOSTRIL EVERY DAY What changed: See the  new instructions.   Gemtesa  75 MG Tabs Generic drug: Vibegron  Take 1 tablet (75 mg total) by mouth daily.   losartan  25 MG tablet Commonly known as: COZAAR  Take 1 tablet (25 mg total) by mouth daily. Needs appt with PCP for additional refills   montelukast  10 MG tablet Commonly known as: SINGULAIR  Take 1 tablet (10 mg total) by mouth at bedtime.   omeprazole  40 MG capsule Commonly known as: PRILOSEC Take 1 capsule (40 mg total) by mouth 2 (two) times daily.   WOMENS VITA PAK PO Take by mouth daily.        Allergies:  Allergies  Allergen Reactions   Oxycodone Other (See Comments)    Feels bad    Family History: Family History  Problem Relation Age of Onset   Hypertension Maternal Aunt    Diabetes Maternal Aunt    Breast cancer Neg Hx     Social History:   reports that she has never smoked. She has never used smokeless tobacco. She reports that she does not currently use alcohol. She reports that she does not currently use drugs.  Physical Exam: BP (!) 141/83   Pulse 89   Ht 5' 4 (1.626 m)   Wt 225 lb 8 oz (102.3 kg)   BMI 38.71 kg/m   Constitutional:  Alert and oriented, no acute distress, nontoxic appearing HEENT: [place], AT Cardiovascular: No clubbing, cyanosis, or edema Respiratory: Normal respiratory effort, no increased work of breathing Skin: No rashes, bruises or suspicious lesions Neurologic: Grossly intact, no focal deficits, moving all 4 extremities Psychiatric: Normal mood and affect  Assessment & Plan:   1. Overactive bladder (Primary) Significant symptomatic improvement on Gemtesa , however this is not covered by her insurance.  Will send in Myrbetriq  50 mg as an alternative.  If this is also cost prohibitive, may consider keeping her on Gemtesa  samples versus third line therapies. - mirabegron  ER (MYRBETRIQ ) 50 MG TB24 tablet; Take 1 tablet (50 mg total) by mouth daily.  Dispense: 30 tablet; Refill: 11  2. Recurrent UTI Continue topical vaginal  estrogen cream for UTI prevention.  Return for Annual OAB f/u with [facility].  Lucie Hones, PA-C  [facility] [address] [place], [place] [address] [phone]

## 2023-08-04 ENCOUNTER — Encounter: Payer: Self-pay | Admitting: Oncology

## 2023-08-11 ENCOUNTER — Ambulatory Visit
Admission: RE | Admit: 2023-08-11 | Discharge: 2023-08-11 | Disposition: A | Discharge status: Home / Self Care | Payer: Medicare HMO | Source: Ambulatory Visit | Attending: Internal Medicine | Admitting: Internal Medicine

## 2023-08-11 ENCOUNTER — Ambulatory Visit: Payer: Medicare HMO | Admitting: Internal Medicine

## 2023-08-11 ENCOUNTER — Ambulatory Visit
Admission: RE | Admit: 2023-08-11 | Discharge: 2023-08-11 | Disposition: A | Discharge status: Home / Self Care | Payer: Medicare HMO | Attending: Internal Medicine | Admitting: Internal Medicine

## 2023-08-11 ENCOUNTER — Encounter: Payer: Self-pay | Admitting: Oncology

## 2023-08-11 ENCOUNTER — Encounter: Payer: Self-pay | Admitting: Internal Medicine

## 2023-08-11 VITALS — BP 130/78 | HR 93 | Ht 64.0 in | Wt 228.4 lb

## 2023-08-11 DIAGNOSIS — R5383 Other fatigue: Secondary | ICD-10-CM | POA: Diagnosis not present

## 2023-08-11 DIAGNOSIS — M546 Pain in thoracic spine: Secondary | ICD-10-CM | POA: Insufficient documentation

## 2023-08-11 DIAGNOSIS — Z1231 Encounter for screening mammogram for malignant neoplasm of breast: Secondary | ICD-10-CM

## 2023-08-11 DIAGNOSIS — F419 Anxiety disorder, unspecified: Secondary | ICD-10-CM

## 2023-08-11 DIAGNOSIS — E785 Hyperlipidemia, unspecified: Secondary | ICD-10-CM

## 2023-08-11 DIAGNOSIS — D509 Iron deficiency anemia, unspecified: Secondary | ICD-10-CM | POA: Diagnosis not present

## 2023-08-11 DIAGNOSIS — B370 Candidal stomatitis: Secondary | ICD-10-CM

## 2023-08-11 DIAGNOSIS — R079 Chest pain, unspecified: Secondary | ICD-10-CM | POA: Diagnosis not present

## 2023-08-11 DIAGNOSIS — R7401 Elevation of levels of liver transaminase levels: Secondary | ICD-10-CM | POA: Diagnosis not present

## 2023-08-11 DIAGNOSIS — I10 Essential (primary) hypertension: Secondary | ICD-10-CM | POA: Diagnosis not present

## 2023-08-11 DIAGNOSIS — J301 Allergic rhinitis due to pollen: Secondary | ICD-10-CM

## 2023-08-11 DIAGNOSIS — R7303 Prediabetes: Secondary | ICD-10-CM | POA: Diagnosis not present

## 2023-08-11 DIAGNOSIS — Z0000 Encounter for general adult medical examination without abnormal findings: Secondary | ICD-10-CM | POA: Diagnosis not present

## 2023-08-11 DIAGNOSIS — N390 Urinary tract infection, site not specified: Secondary | ICD-10-CM

## 2023-08-11 DIAGNOSIS — J4520 Mild intermittent asthma, uncomplicated: Secondary | ICD-10-CM | POA: Insufficient documentation

## 2023-08-11 DIAGNOSIS — K219 Gastro-esophageal reflux disease without esophagitis: Secondary | ICD-10-CM | POA: Diagnosis not present

## 2023-08-11 DIAGNOSIS — K449 Diaphragmatic hernia without obstruction or gangrene: Secondary | ICD-10-CM

## 2023-08-11 LAB — COMPREHENSIVE METABOLIC PANEL WITH GFR
ALT: 37 U/L — ABNORMAL HIGH (ref 0–35)
AST: 21 U/L (ref 0–37)
Albumin: 4.6 g/dL (ref 3.5–5.2)
Alkaline Phosphatase: 80 U/L (ref 39–117)
BUN: 13 mg/dL (ref 6–23)
CO2: 27 meq/L (ref 19–32)
Calcium: 9.9 mg/dL (ref 8.4–10.5)
Chloride: 102 meq/L (ref 96–112)
Creatinine, Ser: 0.84 mg/dL (ref 0.40–1.20)
GFR: 71.09 mL/min
Glucose, Bld: 95 mg/dL (ref 70–99)
Potassium: 4 meq/L (ref 3.5–5.1)
Sodium: 138 meq/L (ref 135–145)
Total Bilirubin: 0.4 mg/dL (ref 0.2–1.2)
Total Protein: 8.8 g/dL — ABNORMAL HIGH (ref 6.0–8.3)

## 2023-08-11 LAB — CBC WITH DIFFERENTIAL/PLATELET
Basophils Absolute: 0.1 K/uL (ref 0.0–0.1)
Basophils Relative: 0.7 % (ref 0.0–3.0)
Eosinophils Absolute: 0.2 K/uL (ref 0.0–0.7)
Eosinophils Relative: 2.2 % (ref 0.0–5.0)
HCT: 40.4 % (ref 36.0–46.0)
Hemoglobin: 13.8 g/dL (ref 12.0–15.0)
Lymphocytes Relative: 42.4 % (ref 12.0–46.0)
Lymphs Abs: 4.5 K/uL — ABNORMAL HIGH (ref 0.7–4.0)
MCHC: 34.1 g/dL (ref 30.0–36.0)
MCV: 81.8 fl (ref 78.0–100.0)
Monocytes Absolute: 0.6 K/uL (ref 0.1–1.0)
Monocytes Relative: 6 % (ref 3.0–12.0)
Neutro Abs: 5.1 K/uL (ref 1.4–7.7)
Neutrophils Relative %: 48.7 % (ref 43.0–77.0)
Platelets: 261 K/uL (ref 150.0–400.0)
RBC: 4.94 Mil/uL (ref 3.87–5.11)
RDW: 15 % (ref 11.5–15.5)
WBC: 10.6 K/uL — ABNORMAL HIGH (ref 4.0–10.5)

## 2023-08-11 LAB — MICROALBUMIN / CREATININE URINE RATIO
Creatinine,U: 75.2 mg/dL
Microalb Creat Ratio: 35.5 mg/g — ABNORMAL HIGH (ref 0.0–30.0)
Microalb, Ur: 26.7 mg/dL — ABNORMAL HIGH (ref 0.0–1.9)

## 2023-08-11 LAB — IBC + FERRITIN
Ferritin: 139.5 ng/mL (ref 10.0–291.0)
Iron: 56 ug/dL (ref 42–145)
Saturation Ratios: 16.6 % — ABNORMAL LOW (ref 20.0–50.0)
TIBC: 337.4 ug/dL (ref 250.0–450.0)
Transferrin: 241 mg/dL (ref 212.0–360.0)

## 2023-08-11 LAB — LIPID PANEL
Cholesterol: 184 mg/dL (ref 0–200)
HDL: 71.9 mg/dL
LDL Cholesterol: 98 mg/dL (ref 0–99)
NonHDL: 112.14
Total CHOL/HDL Ratio: 3
Triglycerides: 73 mg/dL (ref 0.0–149.0)
VLDL: 14.6 mg/dL (ref 0.0–40.0)

## 2023-08-11 LAB — LDL CHOLESTEROL, DIRECT: Direct LDL: 97 mg/dL

## 2023-08-11 LAB — HEMOGLOBIN A1C: Hgb A1c MFr Bld: 6.2 % (ref 4.6–6.5)

## 2023-08-11 LAB — TSH: TSH: 2.12 u[IU]/mL (ref 0.35–5.50)

## 2023-08-11 MED ORDER — ATORVASTATIN CALCIUM 10 MG PO TABS: 10.0000 mg | ORAL_TABLET | Freq: Every day | ORAL | 3 refills | Status: DC

## 2023-08-11 MED ORDER — CITALOPRAM HYDROBROMIDE 10 MG PO TABS: 10.0000 mg | ORAL_TABLET | Freq: Every day | ORAL | 3 refills | Status: DC

## 2023-08-11 MED ORDER — OMEPRAZOLE 40 MG PO CPDR: 40.0000 mg | DELAYED_RELEASE_CAPSULE | Freq: Two times a day (BID) | ORAL | 3 refills | Status: AC

## 2023-08-11 MED ORDER — LOSARTAN POTASSIUM 25 MG PO TABS: 25.0000 mg | ORAL_TABLET | Freq: Every day | ORAL | 0 refills | Status: DC

## 2023-08-11 NOTE — Patient Instructions (Addendum)
Check the name of the bladder medication you are taking .  You were given samples of Gemtesa in October,  but Myrbetriq was prescribe on Jan 9    Pulmonary function tests and chest x ray ordered   Please consider a second pneumonia vaccine when you are feeling better

## 2023-08-11 NOTE — Assessment & Plan Note (Signed)
Resolved with iron infusions. EGD normal Oct 2024   Repeat labs needed

## 2023-08-11 NOTE — Assessment & Plan Note (Signed)

## 2023-08-11 NOTE — Assessment & Plan Note (Addendum)
 Reasonable control  on  on current regimen of losartan  25 mg and diltiazem  CD 120 mg daily; however she has increased proteinuria.SABRA  increase dose of losartan  to 50 mg.    Lab Results  Component Value Date   CREATININE 0.84 08/11/2023   Lab Results  Component Value Date   LABMICR See below: 04/21/2023   LABMICR See below: 08/29/2018   MICROALBUR 26.7 (H) 08/11/2023   MICROALBUR 8.1 (H) 09/29/2022

## 2023-08-11 NOTE — Assessment & Plan Note (Signed)
I have addressed  BMI and recommended wt loss of 10% of body weight over the next 6 months using a low fat, fruit/vegetable based Mediteranean diet and regular exercise a minimum of 5 days per week.   

## 2023-08-11 NOTE — Assessment & Plan Note (Signed)
May be the cause of her early morning wheezing, Advised to sleep on wedge

## 2023-08-11 NOTE — Progress Notes (Signed)
Patient ID: Kaytlen Demetrius, female    DOB: 09-Aug-1955  Age: 69 y.o. MRN: [id number]  The patient is here for annual preventive examination and management of other chronic and acute problems.   The risk factors are reflected in the social history.  The roster of all physicians providing medical care to patient - is listed in the Snapshot section of the chart.  Activities of daily living:  The patient is 100% independent in all ADLs: dressing, toileting, feeding as well as independent mobility  Home safety : The patient has smoke detectors in the home. They wear seatbelts.  There are no firearms at home. There is no violence in the home.   There is no risks for hepatitis, STDs or HIV. There is no   history of blood transfusion. They have no travel history to infectious disease endemic areas of the world.  The patient has seen their dentist in the last six month. They have seen their eye doctor in the last year. They admit to slight hearing difficulty with regard to whispered voices and some television programs.  They have deferred audiologic testing in the last year.  They do not  have excessive sun exposure. Discussed the need for sun protection: hats, long sleeves and use of sunscreen if there is significant sun exposure.   Diet: the importance of a healthy diet is discussed. They do have a healthy diet.  The benefits of regular aerobic exercise were discussed. She walks 4 times per week ,  20 minutes.   Depression screen: there are no signs or vegative symptoms of depression- irritability, change in appetite, anhedonia, sadness/tearfullness.  Cognitive assessment: the patient manages all their financial and personal affairs and is actively engaged. They could relate day,date,year and events; recalled 2/3 objects at 3 minutes; performed clock-face test normally.  The following portions of the patient's history were reviewed and updated as appropriate: allergies, current medications, past family  history, past medical history,  past surgical history, past social history  and problem list.  Visual acuity was not assessed per patient preference since she has regular follow up with her ophthalmologist. Hearing and body mass index were assessed and reviewed.   During the course of the visit the patient was educated and counseled about appropriate screening and preventive services including : fall prevention , diabetes screening, nutrition counseling, colorectal cancer screening, and recommended immunizations.    CC: The primary encounter diagnosis was Encounter for preventive health examination. Diagnoses of Hyperlipidemia, unspecified hyperlipidemia type, Anxiety, Essential hypertension, Gastroesophageal reflux disease without esophagitis, Prediabetes, Other fatigue, Iron deficiency anemia, unspecified iron deficiency anemia type, Encounter for screening mammogram for malignant neoplasm of breast, Oral thrush, Mild intermittent asthma, unspecified whether complicated, Acute right-sided thoracic back pain, Recurrent UTI, Hiatal hernia, Mild intermittent asthma without complication, and Seasonal allergic rhinitis due to pollen were also pertinent to this visit.  1) OAB:  seeing Urology . Gemtesa changed to Myrbetriq at last visit / she's not sure what she is taking currently  2)  recurrent right sided dull posterior thoracic pain during recent URI .  Was not coughing, but felt short of breath,  congested in chest  aggravated by cold air.  No fevers or chills,  "but I feel like I had pneumonia>;  had otitis externa on the right which resolved with a cream  I had given her before.     She has a remote history of an infiltrate on the right by chest x ray   3) mild  intermittent asthma: diagnosed as a child ,  was quiescent for years while living in [place],  no prior PFTs,  feels wheezy in the mornings.  Using albuterol ONLY FOR EPISODES that ppaear to be triggered by viral URIs in the winter  4)  bilateral breast tenderness:  has been drinking excessive amounts of caffeine since living with Gracie   History Breella has a past medical history of Acute blood loss anemia, Arthritis, Asthma, COVID, COVID-19, Diverticulitis, Diverticulosis of colon with hemorrhage, GERD (gastroesophageal reflux disease), GIB (gastrointestinal bleeding), Hiatal hernia, History of blood transfusion, History of chicken pox, History of GI bleed, Hyperlipidemia, Hypertension, Hypovolemic shock (HCC) (04/06/2022), Leukocytosis, Otitis media, unspecified, right ear (01/25/2023), Type O blood, Rh positive, and UTI due to Klebsiella species (01/20/2023).   She has a past surgical history that includes Cesarean section; ORIF acetabular fracture; Colonoscopy; Esophagogastroduodenoscopy (egd) with propofol (N/A, 08/29/2020); Breast cyst aspiration (Right); EMBOLIZATION (N/A, 01/23/2022); Colonoscopy with propofol (N/A, 02/27/2022); IR EMBO ART  VEN HEMORR LYMPH EXTRAV  INC GUIDE ROADMAPPING (04/05/2022); IR Angiogram Visceral Selective (04/05/2022); IR Angiogram Visceral Selective (04/05/2022); IR US Guide Vasc Access Right (04/05/2022); EMBOLIZATION (N/A, 04/06/2022); and Esophagogastroduodenoscopy (egd) with propofol (N/A, 05/13/2023).   Her family history includes Diabetes in her maternal aunt; Hypertension in her maternal aunt.She reports that she has never smoked. She has never used smokeless tobacco. She reports that she does not currently use alcohol. She reports that she does not currently use drugs.  Outpatient Medications Prior to Visit  Medication Sig Dispense Refill   albuterol (VENTOLIN HFA) 108 (90 Base) MCG/ACT inhaler INHALE 1-2 PUFFS BY MOUTH EVERY 6 (SIX) HOURS AS NEEDED 18 each 11   diltiazem (CARDIZEM CD) 120 MG 24 hr capsule Take 1 capsule (120 mg total) by mouth 2 (two) times daily. Keep follow up scheduled visit for future refills. 60 capsule 0   fluticasone (FLONASE) 50 MCG/ACT nasal spray SPRAY 2 SPRAYS INTO EACH  NOSTRIL EVERY DAY (Patient taking differently: Place 2 sprays into both nostrils daily as needed for allergies or rhinitis.) 48 mL 3   mirabegron ER (MYRBETRIQ) 50 MG TB24 tablet Take 1 tablet (50 mg total) by mouth daily. 30 tablet 11   montelukast (SINGULAIR) 10 MG tablet Take 1 tablet (10 mg total) by mouth at bedtime. 90 tablet 3   Specialty Vitamins Products (WOMENS VITA PAK PO) Take by mouth daily.     atorvastatin (LIPITOR) 10 MG tablet Take 1 tablet (10 mg total) by mouth daily at 6 PM. 90 tablet 3   citalopram (CELEXA) 10 MG tablet Take 1 tablet (10 mg total) by mouth daily. 90 tablet 3   losartan (COZAAR) 25 MG tablet Take 1 tablet (25 mg total) by mouth daily. Needs appt with PCP for additional refills 90 tablet 0   omeprazole (PRILOSEC) 40 MG capsule Take 1 capsule (40 mg total) by mouth 2 (two) times daily. 180 capsule 3   estradiol (ESTRACE) 0.1 MG/GM vaginal cream Estrogen Cream Instruction Discard applicator Apply pea sized amount to tip of finger to urethra before bed. Wash hands well after application. Use Monday, Wednesday and Friday (Patient not taking: Reported on 08/11/2023) 42.5 g 12   No facility-administered medications prior to visit.    Review of Systems  Constitutional:  Negative for appetite change, chills, fatigue, fever and unexpected weight change.  Eyes:  Negative for visual disturbance.  Respiratory:  Negative for shortness of breath.   Cardiovascular:  Negative for chest pain and leg swelling.  Gastrointestinal:  Negative for abdominal pain.  Skin:  Negative for color change and rash.  Hematological:  Negative for adenopathy. Does not bruise/bleed easily.  Psychiatric/Behavioral:  Negative for dysphoric mood. The patient is not nervous/anxious.     Objective:  BP 130/78   Pulse 93   Ht 5\' 4"  (1.626 m)   Wt 228 lb 6.4 oz (103.6 kg)   SpO2 96%   BMI 39.20 kg/m   Physical Exam Vitals reviewed.  Constitutional:      General: She is not in acute  distress.    Appearance: Normal appearance. She is well-developed and normal weight. She is not ill-appearing, toxic-appearing or diaphoretic.  HENT:     Head: Normocephalic.     Right Ear: Tympanic membrane, ear canal and external ear normal. There is no impacted cerumen.     Left Ear: Tympanic membrane, ear canal and external ear normal. There is no impacted cerumen.     Nose: Nose normal.     Mouth/Throat:     Mouth: Mucous membranes are moist.     Pharynx: Oropharynx is clear.  Eyes:     General: No scleral icterus.       Right eye: No discharge.        Left eye: No discharge.     Conjunctiva/sclera: Conjunctivae normal.     Pupils: Pupils are equal, round, and reactive to light.  Neck:     Thyroid: No thyromegaly.     Vascular: No carotid bruit or JVD.  Cardiovascular:     Rate and Rhythm: Normal rate and regular rhythm.     Heart sounds: Normal heart sounds.  Pulmonary:     Effort: Pulmonary effort is normal. No respiratory distress.     Breath sounds: Normal breath sounds.  Chest:  Breasts:    Breasts are symmetrical.     Right: Normal. No swelling, inverted nipple, mass, nipple discharge, skin change or tenderness.     Left: Normal. No swelling, inverted nipple, mass, nipple discharge, skin change or tenderness.  Abdominal:     General: Bowel sounds are normal.     Palpations: Abdomen is soft. There is no mass.     Tenderness: There is no abdominal tenderness. There is no guarding or rebound.  Musculoskeletal:        General: Normal range of motion.     Cervical back: Normal range of motion and neck supple.  Lymphadenopathy:     Cervical: No cervical adenopathy.     Upper Body:     Right upper body: No supraclavicular, axillary or pectoral adenopathy.     Left upper body: No supraclavicular, axillary or pectoral adenopathy.  Skin:    General: Skin is warm and dry.  Neurological:     General: No focal deficit present.     Mental Status: She is alert and oriented  to person, place, and time. Mental status is at baseline.  Psychiatric:        Mood and Affect: Mood normal.        Behavior: Behavior normal.        Thought Content: Thought content normal.        Judgment: Judgment normal.     Assessment & Plan:  Encounter for preventive health examination Assessment & Plan: age appropriate education and counseling updated, referrals for preventative services and immunizations addressed, dietary and smoking counseling addressed, most recent labs reviewed.  I have personally reviewed and have noted:   1) the patient's medical and social history  2) The pt's use of alcohol, tobacco, and illicit drugs 3) The patient's current medications and supplements 4) Functional ability including ADL's, fall risk, home safety risk, hearing and visual impairment 5) Diet and physical activities 6) Evidence for depression or mood disorder 7) The patient's height, weight, and BMI have been recorded in the chart   I have made referrals, and provided counseling and education based on review of the above    Hyperlipidemia, unspecified hyperlipidemia type -     Atorvastatin Calcium; Take 1 tablet (10 mg total) by mouth daily at 6 PM.  Dispense: 90 tablet; Refill: 3 -     Lipid panel -     LDL cholesterol, direct  Anxiety -     Citalopram Hydrobromide; Take 1 tablet (10 mg total) by mouth daily.  Dispense: 90 tablet; Refill: 3  Essential hypertension Assessment & Plan: Well controlled on current regimen of losartan 25 mg and diltiazem CD 120 mg daily . Renal function  due     Orders: -     Losartan Potassium; Take 1 tablet (25 mg total) by mouth daily. Needs appt with PCP for additional refills  Dispense: 90 tablet; Refill: 0 -     Comprehensive metabolic panel -     Microalbumin / creatinine urine ratio  Gastroesophageal reflux disease without esophagitis -     Omeprazole; Take 1 capsule (40 mg total) by mouth 2 (two) times daily.  Dispense: 180 capsule;  Refill: 3  Prediabetes Assessment & Plan: I have addressed  BMI and recommended wt loss of 10% of body weight over the next 6 months using a low fat, fruit/vegetable based Mediteranean diet and regular exercise a minimum of 5 days per week.    Orders: -     Comprehensive metabolic panel -     Hemoglobin A1c  Other fatigue -     TSH  Iron deficiency anemia, unspecified iron deficiency anemia type Assessment & Plan: Resolved with iron infusions. EGD normal Oct 2024   Repeat labs needed   Orders: -     CBC with Differential/Platelet -     IBC + Ferritin  Encounter for screening mammogram for malignant neoplasm of breast -     3D Screening Mammogram, Left and Right; Future  Oral thrush Assessment & Plan: Not seen on October 2024 EGD.  KOH prep of nummular lesions was obtained vis brush biopsy and NEGATIVE    Mild intermittent asthma, unspecified whether complicated -     DG Chest 2 View; Future -     Pulmonary Function Test [facility] Only; Future  Acute right-sided thoracic back pain -     DG Chest 2 View; Future  Recurrent UTI -     Urinalysis, Routine w reflex microscopic; Future -     Urine Culture  Hiatal hernia Assessment & Plan: May be the cause of her early morning wheezing, Advised to sleep on wedge   Mild intermittent asthma without complication Assessment & Plan: Diagnosis made clinically > 50 yrs ago, no prior PFTS.  Ordering now along with PA/lateral CXR    Seasonal allergic rhinitis due to pollen Assessment & Plan: Continue singulair and flonase.      Follow-up: No follow-ups on file.   [name], MD

## 2023-08-11 NOTE — Assessment & Plan Note (Signed)
Continue singulair and flonase.  

## 2023-08-11 NOTE — Assessment & Plan Note (Signed)
Not seen on October 2024 EGD.  KOH prep of nummular lesions was obtained vis brush biopsy and NEGATIVE

## 2023-08-11 NOTE — Assessment & Plan Note (Signed)
Diagnosis made clinically > 50 yrs ago, no prior PFTS.  Ordering now along with PA/lateral CXR

## 2023-08-12 ENCOUNTER — Encounter: Payer: Self-pay | Admitting: Internal Medicine

## 2023-08-12 LAB — URINE CULTURE
MICRO NUMBER:: 15984196
Result:: NO GROWTH
SPECIMEN QUALITY:: ADEQUATE

## 2023-08-12 MED ORDER — LOSARTAN POTASSIUM 50 MG PO TABS: 50.0000 mg | ORAL_TABLET | Freq: Every day | ORAL | 1 refills | Status: DC

## 2023-08-12 NOTE — Addendum Note (Signed)
Addended by: [name] on: 08/12/2023 11:35 PM   Modules accepted: Orders

## 2023-08-14 ENCOUNTER — Encounter: Payer: Self-pay | Admitting: Internal Medicine

## 2023-08-16 ENCOUNTER — Encounter: Payer: Self-pay | Admitting: Internal Medicine

## 2023-08-16 ENCOUNTER — Other Ambulatory Visit: Payer: Self-pay | Admitting: Internal Medicine

## 2023-08-16 DIAGNOSIS — I10 Essential (primary) hypertension: Secondary | ICD-10-CM

## 2023-08-16 DIAGNOSIS — R0989 Other specified symptoms and signs involving the circulatory and respiratory systems: Secondary | ICD-10-CM

## 2023-08-16 NOTE — Telephone Encounter (Signed)
Does pt need to be seen sooner than 09/03/2023 with Cardiology?

## 2023-08-17 ENCOUNTER — Encounter: Payer: Self-pay | Admitting: Oncology

## 2023-08-18 ENCOUNTER — Ambulatory Visit: Payer: Medicare HMO | Attending: Cardiology | Admitting: Cardiology

## 2023-08-18 ENCOUNTER — Encounter: Payer: Self-pay | Admitting: Cardiology

## 2023-08-18 VITALS — BP 136/80 | HR 80 | Ht 64.0 in | Wt 228.4 lb

## 2023-08-18 DIAGNOSIS — I471 Supraventricular tachycardia, unspecified: Secondary | ICD-10-CM

## 2023-08-18 DIAGNOSIS — E7800 Pure hypercholesterolemia, unspecified: Secondary | ICD-10-CM | POA: Diagnosis not present

## 2023-08-18 DIAGNOSIS — I10 Essential (primary) hypertension: Secondary | ICD-10-CM | POA: Diagnosis not present

## 2023-08-18 DIAGNOSIS — R0602 Shortness of breath: Secondary | ICD-10-CM

## 2023-08-18 MED ORDER — DILTIAZEM HCL ER COATED BEADS 360 MG PO CP24: 360.0000 mg | ORAL_CAPSULE | Freq: Two times a day (BID) | ORAL | 3 refills | Status: DC

## 2023-08-18 MED ORDER — FUROSEMIDE 20 MG PO TABS: 10.0000 mg | ORAL_TABLET | Freq: Every day | ORAL | 3 refills | Status: AC | PRN

## 2023-08-18 NOTE — Progress Notes (Signed)
Cardiology Office Note:    Date:  08/18/2023   ID:  Cheryl Coleman, DOB Mar 13, 1956, MRN [id number]  PCP:  [name], MD  [facility] Cardiologist:  Debbe Odea, MD  [facility] Electrophysiologist:  None   Referring MD: [name], MD   Chief Complaint  Patient presents with   Follow-up    Patient concerned with possible CHF noted on recent chest xray.      History of Present Illness:    Cheryl Coleman is a 68 y.o. female with a hx of hypertension, hyperlipidemia, paroxysmal SVT, anxiety who presents for shortness of breath.  Followed up with PCP regarding shortness of breath, chest x-ray obtained showed pulmonary vascular congestion.  Has also noticed elevated heart rates usually in the evening when she is about to go to sleep.  Currently takes Cardizem 120 mg twice daily due to paroxysmal SVT.  Denies dizziness, syncope, chest pain.   Prior notes Echocardiogram 08/2020 EF 60 to 65%, diastolic function normal. Cardiac monitor on 09/02/2020 paroxysmal SVT,   Past Medical History:  Diagnosis Date   Acute blood loss anemia    Arthritis    Asthma    COVID    COVID-19    07/24/20, 05/19/21   Diverticulitis    Diverticulosis of colon with hemorrhage    GERD (gastroesophageal reflux disease)    GIB (gastrointestinal bleeding)    diverticular 01/23/22-01/24/22 [facility] s/p emobolization & 15 years ago in [place]   Hiatal hernia    History of blood transfusion    History of chicken pox    History of GI bleed    2013/14   Hyperlipidemia    Hypertension    Hypovolemic shock (HCC) 04/06/2022   Leukocytosis    Otitis media, unspecified, right ear 01/25/2023   Type O blood, Rh positive    UTI due to Klebsiella species 01/20/2023    Past Surgical History:  Procedure Laterality Date   BREAST CYST ASPIRATION Right    CESAREAN SECTION     04/27/84   COLONOSCOPY     COLONOSCOPY WITH PROPOFOL N/A 02/27/2022   Procedure: COLONOSCOPY WITH PROPOFOL;  Surgeon: [name], MD;  Location: [facility] ENDOSCOPY;  Service: Gastroenterology;  Laterality: N/A;   EMBOLIZATION (CATH LAB) N/A 01/23/2022   Procedure: EMBOLIZATION;  Surgeon: [name], MD;  Location: [facility] INVASIVE CV LAB;  Service: Cardiovascular;  Laterality: N/A;   EMBOLIZATION (CATH LAB) N/A 04/06/2022   Procedure: EMBOLIZATION;  Surgeon: Annice Needy, MD;  Location: [facility] INVASIVE CV LAB;  Service: Cardiovascular;  Laterality: N/A;   ESOPHAGOGASTRODUODENOSCOPY (EGD) WITH PROPOFOL N/A 08/29/2020   Procedure: ESOPHAGOGASTRODUODENOSCOPY (EGD) WITH PROPOFOL;  Surgeon: [name], MD;  Location: [facility] ENDOSCOPY;  Service: Gastroenterology;  Laterality: N/A;   ESOPHAGOGASTRODUODENOSCOPY (EGD) WITH PROPOFOL N/A 05/13/2023   Procedure: ESOPHAGOGASTRODUODENOSCOPY (EGD) WITH PROPOFOL;  Surgeon: [name], MD;  Location: [facility] ENDOSCOPY;  Service: Endoscopy;  Laterality: N/A;   IR ANGIOGRAM VISCERAL SELECTIVE  04/05/2022   IR ANGIOGRAM VISCERAL SELECTIVE  04/05/2022   IR EMBO ART  VEN HEMORR LYMPH EXTRAV  INC GUIDE ROADMAPPING  04/05/2022   IR US GUIDE VASC ACCESS RIGHT  04/05/2022   ORIF ACETABULAR FRACTURE      Current Medications: Current Meds  Medication Sig   albuterol (VENTOLIN HFA) 108 (90 Base) MCG/ACT inhaler INHALE 1-2 PUFFS BY MOUTH EVERY 6 (SIX) HOURS AS NEEDED   atorvastatin (LIPITOR) 10 MG tablet Take 1 tablet (10 mg total) by mouth daily at  6 PM.   citalopram (CELEXA) 10 MG tablet Take 1 tablet (10 mg total) by mouth daily.   fluticasone (FLONASE) 50 MCG/ACT nasal spray SPRAY 2 SPRAYS INTO EACH NOSTRIL EVERY DAY (Patient taking differently: Place 2 sprays into both nostrils daily as needed for allergies or rhinitis.)   furosemide (LASIX) 20 MG tablet Take 0.5 tablets (10 mg total) by mouth daily as needed.   losartan (COZAAR) 50 MG tablet Take 1 tablet (50 mg total) by mouth daily. Needs appt with PCP for additional refills   montelukast (SINGULAIR) 10 MG tablet Take 1 tablet  (10 mg total) by mouth at bedtime.   omeprazole (PRILOSEC) 40 MG capsule Take 1 capsule (40 mg total) by mouth 2 (two) times daily.   Specialty Vitamins Products (WOMENS VITA PAK PO) Take by mouth daily.   [DISCONTINUED] diltiazem (CARDIZEM CD) 120 MG 24 hr capsule Take 1 capsule (120 mg total) by mouth 2 (two) times daily. Keep follow up scheduled visit for future refills.     Allergies:   Oxycodone   Social History   Socioeconomic History   Marital status: Married    Spouse name: Not on file   Number of children: Not on file   Years of education: Not on file   Highest education level: Not on file  Occupational History   Not on file  Tobacco Use   Smoking status: Never   Smokeless tobacco: Never  Vaping Use   Vaping status: Never Used  Substance and Sexual Activity   Alcohol use: Not Currently   Drug use: Not Currently   Sexual activity: Yes    Comment: husband   Other Topics Concern   Not on file  Social History Narrative   Lived in [place] from [place]    Married 1st husband died in late 90s/2000s   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, wears seat belt, safe in relationship       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    Social Drivers of Corporate investment banker Strain: Not on file  Food Insecurity: No Food Insecurity (04/06/2022)   Hunger Vital Sign    Worried About Running Out of Food in the Last Year: Never true    Ran Out of Food in the Last Year: Never true  Transportation Needs: No Transportation Needs (04/06/2022)   PRAPARE - Administrator, Civil Service (Medical): No    Lack of Transportation (Non-Medical): No  Physical Activity: Not on file  Stress: Not on file  Social Connections: Not on file     Family History: The patient's family history includes Diabetes in her maternal aunt; Hypertension in her maternal aunt. There is no history of Breast cancer.  ROS:   Please see the history of present illness.     All  other systems reviewed and are negative.  EKGs/Labs/Other Studies Reviewed:    The following studies were reviewed today:   EKG Interpretation Date/Time:  Wednesday August 18 2023 11:56:07 EST Ventricular Rate:  80 PR Interval:  138 QRS Duration:  74 QT Interval:  382 QTC Calculation: 440 R Axis:   22  Text Interpretation: Normal sinus rhythm Biatrial enlargement Septal infarct , age undetermined Confirmed by Debbe Odea ([id number]) on 08/18/2023 12:33:04 PM    Recent Labs: 08/11/2023: ALT 37; BUN 13; Creatinine, Ser 0.84; Hemoglobin 13.8; Platelets 261.0; Potassium 4.0; Sodium 138; TSH 2.12  Recent  Lipid Panel    Component Value Date/Time   CHOL 184 08/11/2023 1022   TRIG 73.0 08/11/2023 1022   HDL 71.90 08/11/2023 1022   CHOLHDL 3 08/11/2023 1022   VLDL 14.6 08/11/2023 1022   LDLCALC 98 08/11/2023 1022   LDLDIRECT 97.0 08/11/2023 1022     Risk Assessment/Calculations:      Physical Exam:    VS:  BP 136/80 (BP Location: Left Arm, Patient Position: Sitting, Cuff Size: Large)   Pulse 80   Ht 5\' 4"  (1.626 m)   Wt 228 lb 6.4 oz (103.6 kg)   SpO2 99%   BMI 39.20 kg/m     Wt Readings from Last 3 Encounters:  08/18/23 228 lb 6.4 oz (103.6 kg)  08/11/23 228 lb 6.4 oz (103.6 kg)  07/29/23 225 lb 8 oz (102.3 kg)     GEN:  Well nourished, well developed in no acute distress HEENT: Normal NECK: No JVD; No carotid bruits CARDIAC: RRR, no murmurs, rubs, gallops RESPIRATORY:  Clear to auscultation without rales, wheezing or rhonchi  ABDOMEN: Soft, non-tender, non-distended MUSCULOSKELETAL:  No edema; No deformity  SKIN: Warm and dry NEUROLOGIC:  Alert and oriented x 3 PSYCHIATRIC:  Normal affect   ASSESSMENT:    1. Shortness of breath   2. Paroxysmal SVT (supraventricular tachycardia) (HCC)   3. Pure hypercholesterolemia   4. Primary hypertension   5. Hypertension, unspecified type    PLAN:    In order of problems listed above:  Shortness of breath,  pulmonary vascular redistribution on chest x-ray.  Obtain echocardiogram to evaluate any new structural abnormalities.  Start Lasix 10 mg daily as needed. Paroxysmal SVT, occasional palpitations in the p.m.  Increase Cardizem CD to 360 mg daily. Hyperlipidemia, cholesterol controlled, continue Lipitor. Hypertension, BP controlled.  Continue losartan 50, Cardizem 360 mg as above.  Follow-up after echo.   Medication Adjustments/Labs and Tests Ordered: Current medicines are reviewed at length with the patient today.  Concerns regarding medicines are outlined above.  Orders Placed This Encounter  Procedures   EKG 12-Lead   ECHOCARDIOGRAM COMPLETE   Meds ordered this encounter  Medications   diltiazem (CARDIZEM CD) 360 MG 24 hr capsule    Sig: Take 1 capsule (360 mg total) by mouth 2 (two) times daily. Keep follow up scheduled visit for future refills.    Dispense:  90 capsule    Refill:  3   furosemide (LASIX) 20 MG tablet    Sig: Take 0.5 tablets (10 mg total) by mouth daily as needed.    Dispense:  45 tablet    Refill:  3    Patient Instructions  Medication Instructions:   Increase Cardizem CD to 360 MG daily.  Start Lasix 10 MG daily as needed.  *If you need a refill on your cardiac medications before your next appointment, please call your pharmacy*   Lab Work: None ordered.  If you have labs (blood work) drawn today and your tests are completely normal, you will receive your results only by: MyChart Message (if you have MyChart) OR A paper copy in the mail If you have any lab test that is abnormal or we need to change your treatment, we will call you to review the results.   Testing/Procedures: Your physician has requested that you have an echocardiogram. Echocardiography is a painless test that uses sound waves to create images of your heart. It provides your doctor with information about the size and shape of your heart and how well  your heart's chambers and valves  are working.   You may receive an ultrasound enhancing agent through an IV if needed to better visualize your heart during the echo. This procedure takes approximately one hour.  There are no restrictions for this procedure.  This will take place at [address] [facility] [address]) [address], [place] [address]  Please note: We ask at that you not bring children with you during ultrasound (echo/ vascular) testing. Due to room size and safety concerns, children are not allowed in the ultrasound rooms during exams. Our front office staff cannot provide observation of children in our lobby area while testing is being conducted. An adult accompanying a patient to their appointment will only be allowed in the ultrasound room at the discretion of the ultrasound technician under special circumstances. We apologize for any inconvenience.    Follow-Up: At [facility], you and your health needs are our priority.  As part of our continuing mission to provide you with exceptional heart care, we have created designated Provider Care Teams.  These Care Teams include your primary Cardiologist (physician) and Advanced Practice Providers (APPs -  Physician Assistants and Nurse Practitioners) who all work together to provide you with the care you need, when you need it.  We recommend signing up for the patient portal called "MyChart".  Sign up information is provided on this After Visit Summary.  MyChart is used to connect with patients for Virtual Visits (Telemedicine).  Patients are able to view lab/test results, encounter notes, upcoming appointments, etc.  Non-urgent messages can be sent to your provider as well.   To learn more about what you can do with MyChart, go to [email or web address].    Your next appointment:   3 month(s)  Provider:   You may see Debbe Odea, MD or one of the following Advanced Practice Providers on your designated Care Team:   Nicolasa Ducking, NP Eula Listen,  PA-C Cadence Fransico Michael, PA-C Charlsie Quest, NP Carlos Levering, NP             Signed, Debbe Odea, MD  08/18/2023 1:17 PM    [facility]

## 2023-08-18 NOTE — Patient Instructions (Signed)
Medication Instructions:   Increase Cardizem CD to 360 MG daily.  Start Lasix 10 MG daily as needed.  *If you need a refill on your cardiac medications before your next appointment, please call your pharmacy*   Lab Work: None ordered.  If you have labs (blood work) drawn today and your tests are completely normal, you will receive your results only by: MyChart Message (if you have MyChart) OR A paper copy in the mail If you have any lab test that is abnormal or we need to change your treatment, we will call you to review the results.   Testing/Procedures: Your physician has requested that you have an echocardiogram. Echocardiography is a painless test that uses sound waves to create images of your heart. It provides your doctor with information about the size and shape of your heart and how well your heart's chambers and valves are working.   You may receive an ultrasound enhancing agent through an IV if needed to better visualize your heart during the echo. This procedure takes approximately one hour.  There are no restrictions for this procedure.  This will take place at [address] [facility] [address], [place] [address]  Please note: We ask at that you not bring children with you during ultrasound (echo/ vascular) testing. Due to room size and safety concerns, children are not allowed in the ultrasound rooms during exams. Our front office staff cannot provide observation of children in our lobby area while testing is being conducted. An adult accompanying a patient to their appointment will only be allowed in the ultrasound room at the discretion of the ultrasound technician under special circumstances. We apologize for any inconvenience.    Follow-Up: At [facility], you and your health needs are our priority.  As part of our continuing mission to provide you with exceptional heart care, we have created designated Provider Care Teams.  These Care Teams  include your primary Cardiologist (physician) and Advanced Practice Providers (APPs -  Physician Assistants and Nurse Practitioners) who all work together to provide you with the care you need, when you need it.  We recommend signing up for the patient portal called "MyChart".  Sign up information is provided on this After Visit Summary.  MyChart is used to connect with patients for Virtual Visits (Telemedicine).  Patients are able to view lab/test results, encounter notes, upcoming appointments, etc.  Non-urgent messages can be sent to your provider as well.   To learn more about what you can do with MyChart, go to [email or web address].    Your next appointment:   3 month(s)  Provider:   You may see Debbe Odea, MD or one of the following Advanced Practice Providers on your designated Care Team:   Nicolasa Ducking, NP Eula Listen, PA-C Cadence Fransico Michael, PA-C Charlsie Quest, NP Carlos Levering, NP

## 2023-08-30 ENCOUNTER — Other Ambulatory Visit (INDEPENDENT_AMBULATORY_CARE_PROVIDER_SITE_OTHER): Payer: Medicare HMO

## 2023-08-30 DIAGNOSIS — N390 Urinary tract infection, site not specified: Secondary | ICD-10-CM

## 2023-08-30 DIAGNOSIS — R7401 Elevation of levels of liver transaminase levels: Secondary | ICD-10-CM | POA: Diagnosis not present

## 2023-08-31 ENCOUNTER — Encounter: Payer: Self-pay | Admitting: Internal Medicine

## 2023-08-31 LAB — HEPATIC FUNCTION PANEL
ALT: 11 U/L (ref 0–35)
AST: 16 U/L (ref 0–37)
Albumin: 4.4 g/dL (ref 3.5–5.2)
Alkaline Phosphatase: 66 U/L (ref 39–117)
Bilirubin, Direct: 0.1 mg/dL (ref 0.0–0.3)
Total Bilirubin: 0.4 mg/dL (ref 0.2–1.2)
Total Protein: 8.7 g/dL — ABNORMAL HIGH (ref 6.0–8.3)

## 2023-09-03 ENCOUNTER — Ambulatory Visit: Payer: Medicare HMO | Admitting: Cardiology

## 2023-09-08 ENCOUNTER — Ambulatory Visit: Payer: Medicare HMO

## 2023-09-26 ENCOUNTER — Other Ambulatory Visit: Payer: Self-pay | Admitting: Gastroenterology

## 2023-09-27 ENCOUNTER — Encounter: Payer: Self-pay | Admitting: Cardiology

## 2023-09-29 ENCOUNTER — Ambulatory Visit: Payer: Medicare HMO | Attending: Cardiology

## 2023-09-29 DIAGNOSIS — R0602 Shortness of breath: Secondary | ICD-10-CM | POA: Diagnosis not present

## 2023-09-29 DIAGNOSIS — I471 Supraventricular tachycardia, unspecified: Secondary | ICD-10-CM | POA: Diagnosis not present

## 2023-09-29 LAB — ECHOCARDIOGRAM COMPLETE
AV Mean grad: 3 mmHg
AV Peak grad: 6.4 mmHg
Ao pk vel: 1.26 m/s
Area-P 1/2: 4.49 cm2
S' Lateral: 2.4 cm

## 2023-09-30 ENCOUNTER — Telehealth: Payer: Self-pay | Admitting: Internal Medicine

## 2023-09-30 ENCOUNTER — Encounter: Payer: Self-pay | Admitting: Oncology

## 2023-09-30 NOTE — Telephone Encounter (Signed)
 By now you have probably received the message from [facility] about a miscalculation on the test called the UACR,  this is the annual urine test that measures the albumin to creatinine ratio and  helps [place] determine if early kidney disease is present.   I have reviewed your  previous test which was done In January and it was abnormal and resulted  in my advice to you to  increase  your losartan dose to 50 mg.  I would like to repeat the  urine test in 3 months to see if the protein ratio has improved, because if not , there may be additional medications recommended.  Please schedule a follow up after the test so we can discuss in person .

## 2023-10-05 MED ORDER — DILTIAZEM HCL ER COATED BEADS 120 MG PO CP24: 120.0000 mg | ORAL_CAPSULE | Freq: Every day | ORAL | 3 refills | Status: AC

## 2023-10-05 MED ORDER — DILTIAZEM HCL ER COATED BEADS 240 MG PO CP24: 240.0000 mg | ORAL_CAPSULE | Freq: Every day | ORAL | 3 refills | Status: AC

## 2023-10-20 ENCOUNTER — Other Ambulatory Visit: Payer: Self-pay | Admitting: Internal Medicine

## 2023-10-20 DIAGNOSIS — J4520 Mild intermittent asthma, uncomplicated: Secondary | ICD-10-CM

## 2023-10-31 ENCOUNTER — Other Ambulatory Visit: Payer: Self-pay | Admitting: Internal Medicine

## 2023-10-31 DIAGNOSIS — I10 Essential (primary) hypertension: Secondary | ICD-10-CM

## 2023-11-15 ENCOUNTER — Telehealth: Admitting: Nurse Practitioner

## 2023-11-15 ENCOUNTER — Ambulatory Visit: Payer: Self-pay

## 2023-11-15 DIAGNOSIS — B370 Candidal stomatitis: Secondary | ICD-10-CM

## 2023-11-15 MED ORDER — NYSTATIN 100000 UNIT/ML MT SUSP: 5.0000 mL | Freq: Four times a day (QID) | OROMUCOSAL | 1 refills | Status: DC

## 2023-11-15 NOTE — Progress Notes (Signed)
 Virtual Visit Consent   Cheryl Coleman, you are scheduled for a virtual visit with a [place] provider today. Just as with appointments in the office, your consent must be obtained to participate. Your consent will be active for this visit and any virtual visit you may have with one of our providers in the next 365 days. If you have a MyChart account, a copy of this consent can be sent to you electronically.  As this is a virtual visit, video technology does not allow for your provider to perform a traditional examination. This may limit your provider's ability to fully assess your condition. If your provider identifies any concerns that need to be evaluated in person or the need to arrange testing (such as labs, EKG, etc.), we will make arrangements to do so. Although advances in technology are sophisticated, we cannot ensure that it will always work on either your end or our end. If the connection with a video visit is poor, the visit may have to be switched to a telephone visit. With either a video or telephone visit, we are not always able to ensure that we have a secure connection.  By engaging in this virtual visit, you consent to the provision of healthcare and authorize for your insurance to be billed (if applicable) for the services provided during this visit. Depending on your insurance coverage, you may receive a charge related to this service.  I need to obtain your verbal consent now. Are you willing to proceed with your visit today? Cheryl Coleman has provided verbal consent on 11/15/2023 for a virtual visit (video or telephone). Mardene Shake, FNP  Date: 11/15/2023 5:30 PM   Virtual Visit via Video Note   I, Mardene Shake, connected with  Cheryl Coleman  ([id number], [birth year]) on 11/15/23 at  5:30 PM EDT by a video-enabled telemedicine application and verified that I am speaking with the correct person using two identifiers.  Location: Patient: Virtual Visit Location Patient: Home Provider:  Virtual Visit Location Provider: Home Office   I discussed the limitations of evaluation and management by telemedicine and the availability of in person appointments. The patient expressed understanding and agreed to proceed.    History of Present Illness: Cheryl Coleman is a 68 y.o. who identifies as a female who was assigned female at birth, and is being seen today for white patched on her tongue.   She has experienced oral thrush in the past and has used nystatin  oral rinses  She has received this from her PCP and GI in the past   Today she is noticing the build up  She has changes her toothbrush with some improvement but is seemed to come back with additional symptoms including itching in the corners of her mouth   She does have GERD and feels when she lays flat this does exacerbate her symptoms   Problems:  Patient Active Problem List   Diagnosis Date Noted   Chronic GERD 05/13/2023   Oral thrush 02/26/2023   Edema of right lower leg due to venous stasis 01/25/2023   Eczema of external ear, bilateral 01/25/2023   History of iron  deficiency anemia 09/29/2022   Iron  deficiency anemia 07/06/2022   Hypomagnesemia    Sigmoid diverticulosis    External hemorrhoids    History of GI diverticular bleed 01/23/2022   BMI 38.0-38.9,adult 01/14/2022   Gallstones 10/24/2020   Osteopenia 05/09/2020   Encounter for preventive health examination 11/03/2019   Onychomycosis 11/03/2019   Asthma 08/29/2019   Hiatal  hernia 06/06/2019   Anxiety 11/25/2018   Palpitations 10/19/2018   Vitamin D  deficiency 10/06/2018   Prediabetes 10/06/2018   Essential hypertension 08/18/2018   Gastroesophageal reflux disease 08/18/2018   Hyperlipidemia 08/18/2018   Mild intermittent asthma 08/18/2018   Allergic rhinitis due to pollen 05/11/2014    Allergies:  Allergies  Allergen Reactions   Oxycodone Other (See Comments)    Feels bad   Medications:  Current Outpatient Medications:    albuterol   (VENTOLIN  HFA) 108 (90 Base) MCG/ACT inhaler, INHALE 1-2 PUFFS BY MOUTH EVERY 6 (SIX) HOURS AS NEEDED, Disp: 18 each, Rfl: 11   atorvastatin  (LIPITOR) 10 MG tablet, Take 1 tablet (10 mg total) by mouth daily at 6 PM., Disp: 90 tablet, Rfl: 3   citalopram  (CELEXA ) 10 MG tablet, Take 1 tablet (10 mg total) by mouth daily., Disp: 90 tablet, Rfl: 3   diltiazem  (CARDIZEM  CD) 120 MG 24 hr capsule, Take 1 capsule (120 mg total) by mouth daily. TAKE IN THE MORNING, Disp: 90 capsule, Rfl: 3   diltiazem  (CARDIZEM  CD) 240 MG 24 hr capsule, Take 1 capsule (240 mg total) by mouth daily. TAKE IN THE EVENING (total 360 mg daily dose), Disp: 90 capsule, Rfl: 3   fluticasone  (FLONASE ) 50 MCG/ACT nasal spray, SPRAY 2 SPRAYS INTO EACH NOSTRIL EVERY DAY (Patient taking differently: Place 2 sprays into both nostrils daily as needed for allergies or rhinitis.), Disp: 48 mL, Rfl: 3   furosemide  (LASIX ) 20 MG tablet, Take 0.5 tablets (10 mg total) by mouth daily as needed., Disp: 45 tablet, Rfl: 3   losartan  (COZAAR ) 50 MG tablet, Take 1 tablet (50 mg total) by mouth daily. Needs appt with PCP for additional refills, Disp: 90 tablet, Rfl: 1   mirabegron  ER (MYRBETRIQ ) 50 MG TB24 tablet, Take 1 tablet (50 mg total) by mouth daily. (Patient not taking: Reported on 08/18/2023), Disp: 30 tablet, Rfl: 11   montelukast  (SINGULAIR ) 10 MG tablet, TAKE 1 TABLET BY MOUTH EVERYDAY AT BEDTIME, Disp: 90 tablet, Rfl: 3   omeprazole  (PRILOSEC) 40 MG capsule, Take 1 capsule (40 mg total) by mouth 2 (two) times daily., Disp: 180 capsule, Rfl: 3   Specialty Vitamins Products (WOMENS VITA PAK PO), Take by mouth daily., Disp: , Rfl:   Observations/Objective: Patient is well-developed, well-nourished in no acute distress.  Resting comfortably  at home.  Head is normocephalic, atraumatic.  No labored breathing.  Speech is clear and coherent with logical content.  Patient is alert and oriented at baseline.    Assessment and Plan:  1. Oral  thrush Meds ordered this encounter  Medications   nystatin  (MYCOSTATIN ) 100000 UNIT/ML suspension    Sig: Take 5 mLs (500,000 Units total) by mouth 4 (four) times daily. Swish and spit four times daily.    Dispense:  473 mL    Refill:  1     Follow Up Instructions: I discussed the assessment and treatment plan with the patient. The patient was provided an opportunity to ask questions and all were answered. The patient agreed with the plan and demonstrated an understanding of the instructions.  A copy of instructions were sent to the patient via MyChart unless otherwise noted below.    The patient was advised to call back or seek an in-person evaluation if the symptoms worsen or if the condition fails to improve as anticipated.    Mardene Shake, FNP

## 2023-11-15 NOTE — Telephone Encounter (Signed)
 11/15/2023 4:31pm- Patient answered, then discontinued call. RN attempted to call again, no answer, LVMTCB [phone]   Copied from CRM [id number]. Topic: Clinical - Medication Refill >> Nov 15, 2023 10:52 AM Felizardo Hotter wrote: Most Recent Primary Care Visit:  Provider: [facility]  Department: [facility]  Visit Type: LAB VISIT  Date: 08/30/2023  Medication: Nystatin   Has the patient contacted their pharmacy? Yes (Agent: If no, request that the patient contact the pharmacy for the refill. If patient does not wish to contact the pharmacy document the reason why and proceed with request.) (Agent: If yes, when and what did the pharmacy advise?)Pharmacy need PCP approval  Is this the correct pharmacy for this prescription? Yes If no, delete pharmacy and type the correct one.  This is the patient's preferred pharmacy:  [facility] [phone] [place] - [address] [place] [address] Phone: [phone] Fax: [phone]  Has the prescription been filled recently? Yes  Is the patient out of the medication? Yes  Has the patient been seen for an appointment in the last year OR does the patient have an upcoming appointment? Yes  Can we respond through MyChart? Yes  Agent: Please be advised that Rx refills may take up to 3 business days. We ask that you follow-up with your pharmacy.

## 2023-11-15 NOTE — Telephone Encounter (Signed)
 Chief Complaint: Mouth itching Symptoms: White patches in mouth Frequency: x 3 weeks intermittently Pertinent Negatives: Patient denies fever, SOB, swelling Disposition: [] ED /[] Urgent Care (no appt availability in office) / [x] Appointment(In office/virtual)/ []  [facility]/ [] Home Care/ [] Refused Recommended Disposition /[] [facility]/ []  Follow-up with PCP Additional Notes: Pt reports she has history of thrush. Notes she has had itching and white patches inside mouth intermittently x 3 weeks. Pt denies fever, SOB, swelling, pain. VV scheduled. This RN educated pt on home care, new-worsening symptoms, when to call back/seek emergent care. Pt verbalized understanding and agrees to plan.    Reason for Disposition  [1] White patches that stick to tongue or inner cheek AND [2] can be wiped off  Answer Assessment - Initial Assessment Questions 1. SYMPTOM: "What's the main symptom you're concerned about?" (e.g., chapped lips, dry mouth, lump, sores)     Tongue is white, itching in mouth 2. ONSET: "When did the  symptoms  start?"     X 2 weeks intermittently 3. PAIN: "Is there any pain?" If Yes, ask: "How bad is it?" (Scale: 1-10; mild, moderate, severe)   - MILD (1-3):  doesn't interfere with eating or normal activities   - MODERATE (4-7): interferes with eating some solids and normal activities   - SEVERE (8-10):  excruciating pain, interferes with most normal activities   - SEVERE DYSPHAGIA: can't swallow liquids, drooling     None 4. CAUSE: "What do you think is causing the symptoms?"     Pt believes thrush, history 5. OTHER SYMPTOMS: "Do you have any other symptoms?" (e.g., fever, sore throat, toothache, swelling)     None  Protocols used: Mouth Symptoms-A-AH

## 2023-11-16 NOTE — Telephone Encounter (Signed)
 Pt is scheduled for a virtual visit this evening

## 2023-11-19 ENCOUNTER — Ambulatory Visit: Payer: Medicare HMO | Attending: Cardiology | Admitting: Cardiology

## 2023-11-25 ENCOUNTER — Ambulatory Visit
Admission: RE | Admit: 2023-11-25 | Discharge: 2023-11-25 | Disposition: A | Discharge status: Home / Self Care | Source: Ambulatory Visit | Attending: Internal Medicine | Admitting: Internal Medicine

## 2023-11-25 DIAGNOSIS — Z1231 Encounter for screening mammogram for malignant neoplasm of breast: Secondary | ICD-10-CM | POA: Diagnosis present

## 2023-12-14 ENCOUNTER — Ambulatory Visit

## 2023-12-14 ENCOUNTER — Encounter: Payer: Self-pay | Admitting: Internal Medicine

## 2023-12-14 ENCOUNTER — Ambulatory Visit (INDEPENDENT_AMBULATORY_CARE_PROVIDER_SITE_OTHER): Admitting: Internal Medicine

## 2023-12-14 VITALS — BP 124/80 | HR 79 | Ht 64.0 in | Wt 233.0 lb

## 2023-12-14 DIAGNOSIS — R7303 Prediabetes: Secondary | ICD-10-CM

## 2023-12-14 DIAGNOSIS — D500 Iron deficiency anemia secondary to blood loss (chronic): Secondary | ICD-10-CM | POA: Diagnosis not present

## 2023-12-14 DIAGNOSIS — G8929 Other chronic pain: Secondary | ICD-10-CM | POA: Insufficient documentation

## 2023-12-14 DIAGNOSIS — I10 Essential (primary) hypertension: Secondary | ICD-10-CM | POA: Diagnosis not present

## 2023-12-14 DIAGNOSIS — M25552 Pain in left hip: Secondary | ICD-10-CM

## 2023-12-14 DIAGNOSIS — Z8719 Personal history of other diseases of the digestive system: Secondary | ICD-10-CM | POA: Diagnosis not present

## 2023-12-14 MED ORDER — LOSARTAN POTASSIUM 50 MG PO TABS: 50.0000 mg | ORAL_TABLET | Freq: Every day | ORAL | 1 refills | Status: DC

## 2023-12-14 NOTE — Assessment & Plan Note (Signed)
 BMI > 35 accompanied by hypertension . Patient's BMI  Was reviewed and discussed with patient.  Low GI diet (prediabetic) and   regularl participation in exercise recommended.

## 2023-12-14 NOTE — Assessment & Plan Note (Signed)
 Reasonable control  on  on current regimen of losartan50  mg and diltiazem  CD 120 mg daily; however she has increased proteinuria. Lab Results  Component Value Date   CREATININE 0.84 08/11/2023   Lab Results  Component Value Date   LABMICR See below: 04/21/2023   LABMICR See below: 08/29/2018   MICROALBUR 26.7 (H) 08/11/2023   MICROALBUR 8.1 (H) 09/29/2022

## 2023-12-14 NOTE — Patient Instructions (Addendum)
 Your hip pain sounds like it is due to osteoarthritis, but the plain x rays are needed to rule out bone spurring and other degenerative changes that can cause moderate to severe pain   You can take  up to 2000 mg of acetominophen (tylenol ) every day safely  In divided doses (500 mg every 6 hours  Or 1000 mg every 12 hours.)     The  diet I mentioned to  today is the 10 day Green Smoothie Cleansing /Detox Diet by Terisa Fern . available at [employer] or on [employer] for around $10.  This is a low fat diet that elminates sugar, gluten, caffeine, alcohol and dairy products  for 10 days .  What you add back after the initial ten days is entirely up to  you!  You can expect to lose 5 to 10 lbs depending on how strict you are and how many meals your substitute .   I suggest  Substituting 2 meals daily and keeping one chewable meal (but keep it simple, like baked fish and salad, rice or bok choy) kept me satisfied and kept me from straying  .  You snack primarily on fresh  fruit, egg whites and judicious quantities of nuts. You can add a  vegetable based protein powder  To any smoothie made with almond milk.  (do not use protein powders made from whey , since whey is dairy) .  [employer] has a [employer] .

## 2023-12-14 NOTE — Assessment & Plan Note (Signed)
I have addressed  BMI and recommended wt loss of 10% of body weight over the next 6 months using a low fat, fruit/vegetable based Mediteranean diet and regular exercise a minimum of 5 days per week.   

## 2023-12-14 NOTE — Assessment & Plan Note (Addendum)
 Recurrent for the past month.  Aggravated by inactivity. In the absence of unusual physical activity or fall  DDX includes OA and DJD.  Plain films ordered.

## 2023-12-14 NOTE — Assessment & Plan Note (Signed)
 Resolved with iron  infusions. EGD normal Oct 2024    Lab Results  Component Value Date   WBC 10.6 (H) 08/11/2023   HGB 13.8 08/11/2023   HCT 40.4 08/11/2023   MCV 81.8 08/11/2023   PLT 261.0 08/11/2023

## 2023-12-14 NOTE — Progress Notes (Signed)
 Subjective:  Patient ID: Cheryl Coleman, female    DOB: 1955/10/08  Age: 68 y.o. MRN: [id number]  CC: The primary encounter diagnosis was History of GI diverticular bleed. Diagnoses of Essential hypertension, Chronic left hip pain, Iron  deficiency anemia due to chronic blood loss, Prediabetes, and Morbid obesity (HCC) were also pertinent to this visit.   HPI Cheryl Coleman presents for  Chief Complaint  Patient presents with   left side pain    Left sided hip pain radiates to  the left buttock  by the end of the day,  started about a month ago.  Now radiates to groin.   Aggravated by inactivity and brought on by  changing positions.   Improves with walking. No history of fall or unusual activity ,   Obesity:  she has had a weight gain of 11 lbs since last summer.  Body mass index is 39.99 kg/m. Discussed her diet and  her lack of regular exercise.  She is prediabetic,  highest A1c was 6.4 in 2022 , lowest 5.9 in 2023,  currently 6.2 in Jan 2025 .   Denies hip and ankle pain.   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   HTN:  home readings improved with increase in losartan  dose to 50 mg daily    Outpatient Medications Prior to Visit  Medication Sig Dispense Refill   albuterol  (VENTOLIN  HFA) 108 (90 Base) MCG/ACT inhaler INHALE 1-2 PUFFS BY MOUTH EVERY 6 (SIX) HOURS AS NEEDED 18 each 11   atorvastatin  (LIPITOR) 10 MG tablet Take 1 tablet (10 mg total) by mouth daily at 6 PM. 90 tablet 3   citalopram  (CELEXA ) 10 MG tablet Take 1 tablet (10 mg total) by mouth daily. 90 tablet 3   diltiazem  (CARDIZEM  CD) 120 MG 24 hr capsule Take 1 capsule (120 mg total) by mouth daily. TAKE IN THE MORNING 90 capsule 3   diltiazem  (CARDIZEM  CD) 240 MG 24 hr capsule Take 1 capsule (240 mg total) by mouth daily. TAKE IN THE EVENING (total 360 mg daily dose) 90 capsule 3   fluticasone  (FLONASE ) 50 MCG/ACT nasal spray SPRAY 2 SPRAYS INTO EACH NOSTRIL EVERY DAY  (Patient taking differently: Place 2 sprays into both nostrils daily as needed for allergies or rhinitis.) 48 mL 3   montelukast  (SINGULAIR ) 10 MG tablet TAKE 1 TABLET BY MOUTH EVERYDAY AT BEDTIME 90 tablet 3   nystatin  (MYCOSTATIN ) 100000 UNIT/ML suspension Take 5 mLs (500,000 Units total) by mouth 4 (four) times daily. Swish and spit four times daily. 473 mL 1   omeprazole  (PRILOSEC) 40 MG capsule Take 1 capsule (40 mg total) by mouth 2 (two) times daily. 180 capsule 3   Specialty Vitamins Products (WOMENS VITA PAK PO) Take by mouth daily.     losartan  (COZAAR ) 50 MG tablet Take 1 tablet (50 mg total) by mouth daily. Needs appt with PCP for additional refills 90 tablet 1   mirabegron  ER (MYRBETRIQ ) 50 MG TB24 tablet Take 1 tablet (50 mg total) by mouth daily. 30 tablet 11   furosemide  (LASIX ) 20 MG tablet Take 0.5 tablets (10 mg total) by mouth daily as needed. 45 tablet 3   No facility-administered medications prior to visit.    Review of Systems;  Patient denies headache, fevers, malaise, unintentional weight loss, skin rash, eye pain, sinus congestion and sinus pain, sore throat, dysphagia,  hemoptysis , cough, dyspnea, wheezing, chest pain, palpitations,  orthopnea, edema, abdominal pain, nausea, melena, diarrhea, constipation, flank pain, dysuria, hematuria, urinary  Frequency, nocturia, numbness, tingling, seizures,  Focal weakness, Loss of consciousness,  Tremor, insomnia, depression, anxiety, and suicidal ideation.      Objective:  BP 124/80   Pulse 79   Ht 5\' 4"  (1.626 m)   Wt 233 lb (105.7 kg)   SpO2 97%   BMI 39.99 kg/m   BP Readings from Last 3 Encounters:  12/14/23 124/80  08/18/23 136/80  08/11/23 130/78    Wt Readings from Last 3 Encounters:  12/14/23 233 lb (105.7 kg)  08/18/23 228 lb 6.4 oz (103.6 kg)  08/11/23 228 lb 6.4 oz (103.6 kg)    Physical Exam Vitals reviewed.  Constitutional:      General: She is not in acute distress.    Appearance: Normal  appearance. She is obese. She is not ill-appearing, toxic-appearing or diaphoretic.  HENT:     Head: Normocephalic.  Eyes:     General: No scleral icterus.       Right eye: No discharge.        Left eye: No discharge.     Conjunctiva/sclera: Conjunctivae normal.  Musculoskeletal:        General: Normal range of motion.     Right hip: Normal. No tenderness. Normal range of motion.     Left hip: Normal. Normal range of motion.  Skin:    General: Skin is warm and dry.  Neurological:     General: No focal deficit present.     Mental Status: She is alert and oriented to person, place, and time. Mental status is at baseline.  Psychiatric:        Mood and Affect: Mood normal.        Behavior: Behavior normal.        Thought Content: Thought content normal.        Judgment: Judgment normal.    Lab Results  Component Value Date   HGBA1C 6.2 08/11/2023   HGBA1C 5.9 09/29/2022   HGBA1C 6.4 10/10/2021    Lab Results  Component Value Date   CREATININE 0.84 08/11/2023   CREATININE 0.87 10/20/2022   CREATININE 0.85 09/29/2022    Lab Results  Component Value Date   WBC 10.6 (H) 08/11/2023   HGB 13.8 08/11/2023   HCT 40.4 08/11/2023   PLT 261.0 08/11/2023   GLUCOSE 95 08/11/2023   CHOL 184 08/11/2023   TRIG 73.0 08/11/2023   HDL 71.90 08/11/2023   LDLDIRECT 97.0 08/11/2023   LDLCALC 98 08/11/2023   ALT 11 08/30/2023   AST 16 08/30/2023   NA 138 08/11/2023   K 4.0 08/11/2023   CL 102 08/11/2023   CREATININE 0.84 08/11/2023   BUN 13 08/11/2023   CO2 27 08/11/2023   TSH 2.12 08/11/2023   HGBA1C 6.2 08/11/2023   MICROALBUR 26.7 (H) 08/11/2023    MM 3D SCREENING MAMMOGRAM BILATERAL BREAST Result Date: 11/26/2023 CLINICAL DATA:  Screening. EXAM: DIGITAL SCREENING BILATERAL MAMMOGRAM WITH TOMOSYNTHESIS AND CAD TECHNIQUE: Bilateral screening digital craniocaudal and mediolateral oblique mammograms were obtained. Bilateral screening digital breast tomosynthesis was performed. The  images were evaluated with computer-aided detection. COMPARISON:  Previous exam(s). ACR Breast Density Category b: There are scattered areas of fibroglandular density. FINDINGS: There are no findings suspicious for malignancy. IMPRESSION: No mammographic evidence of malignancy. A result letter of this screening mammogram will be mailed directly to the patient. RECOMMENDATION: Screening mammogram in one year. (Code:SM-B-01Y) BI-RADS CATEGORY  1: Negative. Electronically  Signed   By: Meghana  Konanur M.D.   On: 11/26/2023 12:44    Assessment & Plan:  .History of GI diverticular bleed  Essential hypertension Assessment & Plan: Reasonable control  on  on current regimen of losartan50  mg and diltiazem  CD 120 mg daily; however she has increased proteinuria. Lab Results  Component Value Date   CREATININE 0.84 08/11/2023   Lab Results  Component Value Date   LABMICR See below: 04/21/2023   LABMICR See below: 08/29/2018   MICROALBUR 26.7 (H) 08/11/2023   MICROALBUR 8.1 (H) 09/29/2022         Orders: -     Losartan  Potassium; Take 1 tablet (50 mg total) by mouth daily. Needs appt with PCP for additional refills  Dispense: 90 tablet; Refill: 1  Chronic left hip pain Assessment & Plan: Recurrent for the past month.  Aggravated by inactivity. In the absence of unusual physical activity or fall  DDX includes OA and DJD.  Plain films ordered.   Orders: -     DG HIP UNILAT W OR W/O PELVIS 2-3 VIEWS LEFT; Future  Iron  deficiency anemia due to chronic blood loss Assessment & Plan: Resolved with iron  infusions. EGD normal Oct 2024    Lab Results  Component Value Date   WBC 10.6 (H) 08/11/2023   HGB 13.8 08/11/2023   HCT 40.4 08/11/2023   MCV 81.8 08/11/2023   PLT 261.0 08/11/2023      Prediabetes Assessment & Plan: I have addressed  BMI and recommended wt loss of 10% of body weight over the next 6 months using a low fat, fruit/vegetable based [place] diet and regular exercise a  minimum of 5 days per week.     Morbid obesity (HCC) Assessment & Plan: BMI > 35 accompanied by hypertension . Patient's BMI  Was reviewed and discussed with patient.  Low GI diet (prediabetic) and   regularl participation in exercise recommended.       I spent 34 minutes on the day of this face to face encounter reviewing patient's  most recent visit with cardiology,  nephrology,  and neurology,  prior relevant surgical and non surgical procedures, recent  labs and imaging studies, counseling on weight management,  reviewing the assessment and plan with patient, and post visit ordering and reviewing of  diagnostics and therapeutics with patient  .   Follow-up: No follow-ups on file.   Thersia Flax, MD

## 2023-12-23 ENCOUNTER — Ambulatory Visit: Payer: Self-pay | Admitting: Internal Medicine

## 2024-01-08 ENCOUNTER — Ambulatory Visit: Payer: Self-pay

## 2024-01-09 ENCOUNTER — Ambulatory Visit
Admission: RE | Admit: 2024-01-09 | Discharge: 2024-01-09 | Disposition: A | Discharge status: Home / Self Care | Payer: Self-pay | Source: Ambulatory Visit | Attending: Emergency Medicine | Admitting: Emergency Medicine

## 2024-01-09 ENCOUNTER — Encounter: Payer: Self-pay | Admitting: Oncology

## 2024-01-09 VITALS — BP 131/82 | HR 77 | Temp 97.1°F | Resp 18

## 2024-01-09 DIAGNOSIS — M546 Pain in thoracic spine: Secondary | ICD-10-CM | POA: Diagnosis not present

## 2024-01-09 MED ORDER — BACLOFEN 5 MG PO TABS: 5.0000 mg | ORAL_TABLET | Freq: Every day | ORAL | 0 refills | Status: AC

## 2024-01-09 MED ORDER — PREDNISONE 10 MG (21) PO TBPK: ORAL_TABLET | Freq: Every day | ORAL | 0 refills | Status: DC

## 2024-01-09 NOTE — Discharge Instructions (Signed)
 Your pain is most likely caused by irritation to the muscles.  Begin prednisone  every morning with food as directed to reduce inflammation and help with pain, may take Tylenol  or any topical medicines additionally  May use muscle relaxant at bedtime as needed for additional comfort to help you rest  You may use heating pad in 15 minute intervals as needed for additional comfort  Begin stretching affected area daily for 10 minutes as tolerated to further loosen muscles   When lying down place pillow underneath and between knees for support  Can try sleeping without pillow on firm mattress   Practice good posture: head back, shoulders back, chest forward, pelvis back and weight distributed evenly on both legs  If pain persist after recommended treatment or reoccurs if may be beneficial to follow up with orthopedic specialist for evaluation, this doctor specializes in the bones and can manage your symptoms long-term with options such as but not limited to imaging, medications or physical therapy

## 2024-01-09 NOTE — ED Provider Notes (Signed)
 Cheryl Coleman    CSN: [id number] Arrival date & time: 01/09/24  1325      History   Chief Complaint Chief Complaint  Patient presents with   Back Pain    Pain on left side rib area - Entered by patient    HPI Cheryl Coleman is a 68 y.o. female.   Patient presents for evaluation of left pain beginning 3 days ago.  Has been constant, fluctuating in intensity, worse at nighttime.  Intermittently wrapping into the back.  Exacerbated by movement and when lying down.  Denies numbness or tingling, injury or trauma.  Has attempted use of Anoro which has been somewhat.  Endorses at times her bra strap can be tight causing indentions, unsure if related.  Denies urinary, abdominal or respiratory symptoms  Past Medical History:  Diagnosis Date   Acute blood loss anemia    Arthritis    Asthma    COVID    COVID-19    07/24/20, 05/19/21   Diverticulitis    Diverticulosis of colon with hemorrhage    GERD (gastroesophageal reflux disease)    GIB (gastrointestinal bleeding)    diverticular 01/23/22-01/24/22 [facility] s/p emobolization & 15 years ago in [place]   Hiatal hernia    History of blood transfusion    History of chicken pox    History of GI bleed    2013/14   Hyperlipidemia    Hypertension    Hypovolemic shock (HCC) 04/06/2022   Leukocytosis    Onychomycosis 11/03/2019   Otitis media, unspecified, right ear 01/25/2023   Type O blood, Rh positive    UTI due to Klebsiella species 01/20/2023    Patient Active Problem List   Diagnosis Date Noted   Chronic left hip pain 12/14/2023   Morbid obesity (HCC) 12/14/2023   Chronic GERD 05/13/2023   Edema of right lower leg due to venous stasis 01/25/2023   Eczema of external ear, bilateral 01/25/2023   History of iron  deficiency anemia 09/29/2022   Iron  deficiency anemia 07/06/2022   Hypomagnesemia    Sigmoid diverticulosis    External hemorrhoids    History of GI diverticular bleed 01/23/2022   Gallstones 10/24/2020   Osteopenia  05/09/2020   Encounter for preventive health examination 11/03/2019   Asthma 08/29/2019   Hiatal hernia 06/06/2019   Anxiety 11/25/2018   Palpitations 10/19/2018   Vitamin D  deficiency 10/06/2018   Prediabetes 10/06/2018   Essential hypertension 08/18/2018   Gastroesophageal reflux disease 08/18/2018   Hyperlipidemia 08/18/2018   Mild intermittent asthma 08/18/2018   Allergic rhinitis due to pollen 05/11/2014    Past Surgical History:  Procedure Laterality Date   BREAST CYST ASPIRATION Right    CESAREAN SECTION     04/27/84   COLONOSCOPY     COLONOSCOPY WITH PROPOFOL  N/A 02/27/2022   Procedure: COLONOSCOPY WITH PROPOFOL ;  Surgeon: Unk Corinn Skiff, MD;  Location: [facility] ENDOSCOPY;  Service: Gastroenterology;  Laterality: N/A;   EMBOLIZATION (CATH LAB) N/A 01/23/2022   Procedure: EMBOLIZATION;  Surgeon: Jama Cordella MATSU, MD;  Location: [facility] INVASIVE CV LAB;  Service: Cardiovascular;  Laterality: N/A;   EMBOLIZATION (CATH LAB) N/A 04/06/2022   Procedure: EMBOLIZATION;  Surgeon: Marea Selinda RAMAN, MD;  Location: [facility] INVASIVE CV LAB;  Service: Cardiovascular;  Laterality: N/A;   ESOPHAGOGASTRODUODENOSCOPY (EGD) WITH PROPOFOL  N/A 08/29/2020   Procedure: ESOPHAGOGASTRODUODENOSCOPY (EGD) WITH PROPOFOL ;  Surgeon: Unk Corinn Skiff, MD;  Location: [facility] ENDOSCOPY;  Service: Gastroenterology;  Laterality: N/A;   ESOPHAGOGASTRODUODENOSCOPY (EGD) WITH PROPOFOL  N/A 05/13/2023  Procedure: ESOPHAGOGASTRODUODENOSCOPY (EGD) WITH PROPOFOL ;  Surgeon: Unk Corinn Skiff, MD;  Location: [facility] ENDOSCOPY;  Service: Endoscopy;  Laterality: N/A;   IR ANGIOGRAM VISCERAL SELECTIVE  04/05/2022   IR ANGIOGRAM VISCERAL SELECTIVE  04/05/2022   IR EMBO ART  VEN HEMORR LYMPH EXTRAV  INC GUIDE ROADMAPPING  04/05/2022   IR US  GUIDE VASC ACCESS RIGHT  04/05/2022   ORIF ACETABULAR FRACTURE      OB History   No obstetric history on file.      Home Medications    Prior to Admission medications   Medication Sig Start  Date End Date Taking? Authorizing Provider  Baclofen 5 MG TABS Take 1 tablet (5 mg total) by mouth daily. 01/09/24  Yes [place] R, NP  predniSONE  (STERAPRED UNI-PAK 21 TAB) 10 MG (21) TBPK tablet Take by mouth daily. Take 6 tabs by mouth daily  for 1 days, then 5 tabs for 1 days, then 4 tabs for 1 days, then 3 tabs for 1 days, 2 tabs for 1 days, then 1 tab by mouth daily for 1 days 01/09/24  Yes Khizar Fiorella, Shelba R, NP  albuterol  (VENTOLIN  HFA) 108 (90 Base) MCG/ACT inhaler INHALE 1-2 PUFFS BY MOUTH EVERY 6 (SIX) HOURS AS NEEDED 02/02/23   Marylynn Verneita CROME, MD  atorvastatin  (LIPITOR) 10 MG tablet Take 1 tablet (10 mg total) by mouth daily at 6 PM. 08/11/23   Marylynn Verneita CROME, MD  citalopram  (CELEXA ) 10 MG tablet Take 1 tablet (10 mg total) by mouth daily. 08/11/23   Marylynn Verneita CROME, MD  diltiazem  (CARDIZEM  CD) 120 MG 24 hr capsule Take 1 capsule (120 mg total) by mouth daily. TAKE IN THE MORNING 10/05/23 01/03/24  Darliss Rogue, MD  diltiazem  (CARDIZEM  CD) 240 MG 24 hr capsule Take 1 capsule (240 mg total) by mouth daily. TAKE IN THE EVENING (total 360 mg daily dose) 10/05/23 01/03/24  Darliss Rogue, MD  fluticasone  (FLONASE ) 50 MCG/ACT nasal spray SPRAY 2 SPRAYS INTO EACH NOSTRIL EVERY DAY Patient taking differently: Place 2 sprays into both nostrils daily as needed for allergies or rhinitis. 09/09/21   [name], [name], MD  furosemide  (LASIX ) 20 MG tablet Take 0.5 tablets (10 mg total) by mouth daily as needed. 08/18/23 11/16/23  Darliss Rogue, MD  losartan  (COZAAR ) 50 MG tablet Take 1 tablet (50 mg total) by mouth daily. Needs appt with PCP for additional refills 12/14/23   Marylynn Verneita CROME, MD  montelukast  (SINGULAIR ) 10 MG tablet TAKE 1 TABLET BY MOUTH EVERYDAY AT BEDTIME 10/22/23   Marylynn Verneita CROME, MD  nystatin  (MYCOSTATIN ) 100000 UNIT/ML suspension Take 5 mLs (500,000 Units total) by mouth 4 (four) times daily. Swish and spit four times daily. 11/15/23   Kennyth Domino, FNP  omeprazole   (PRILOSEC) 40 MG capsule Take 1 capsule (40 mg total) by mouth 2 (two) times daily. 08/11/23   Marylynn Verneita CROME, MD  Specialty Vitamins Products (WOMENS VITA PAK PO) Take by mouth daily.    [provider]    Family History Family History  Problem Relation Age of Onset   Hypertension Maternal Aunt    Diabetes Maternal Aunt    Breast cancer Neg Hx     Social History Social History   Tobacco Use   Smoking status: Never   Smokeless tobacco: Never  Vaping Use   Vaping status: Never Used  Substance Use Topics   Alcohol use: Not Currently   Drug use: Not Currently     Allergies   Oxycodone  Review of Systems Review of Systems   Physical Exam Triage Vital Signs ED Triage Vitals  Encounter Vitals Group     BP 01/09/24 1334 131/82     Girls Systolic BP Percentile --      Girls Diastolic BP Percentile --      Boys Systolic BP Percentile --      Boys Diastolic BP Percentile --      Pulse Rate 01/09/24 1334 77     Resp 01/09/24 1334 18     Temp 01/09/24 1334 (!) 97.1 F (36.2 C)     Temp Source 01/09/24 1334 Tympanic     SpO2 01/09/24 1334 99 %     Weight --      Height --      Head Circumference --      Peak Flow --      Pain Score 01/09/24 1333 2     Pain Loc --      Pain Education --      Exclude from Growth Chart --    No data found.  Updated Vital Signs BP 131/82 (BP Location: Left Arm)   Pulse 77   Temp (!) 97.1 F (36.2 C) (Tympanic)   Resp 18   SpO2 99%   Visual Acuity Right Eye Distance:   Left Eye Distance:   Bilateral Distance:    Right Eye Near:   Left Eye Near:    Bilateral Near:     Physical Exam Constitutional:      Appearance: Normal appearance.   Eyes:     Extraocular Movements: Extraocular movements intact.   Pulmonary:     Effort: Pulmonary effort is normal.   Musculoskeletal:     Comments: Tenderness into the left thoracic region of the back, no spinal tenderness noted, able to sit erect without complication, pain  can be felt with turns of the trunk of the body   Neurological:     Mental Status: She is alert and oriented to person, place, and time. Mental status is at baseline.      UC Treatments / Results  Labs (all labs ordered are listed, but only abnormal results are displayed) Labs Reviewed - No data to display  EKG   Radiology No results found.  Procedures Procedures (including critical care time)  Medications Ordered in UC Medications - No data to display  Initial Impression / Assessment and Plan / UC Course  I have reviewed the triage vital signs and the nursing notes.  Pertinent labs & imaging results that were available during my care of the patient were reviewed by me and considered in my medical decision making (see chart for details).  Acute left-sided thoracic back pain  Etiology most likely muscle, reproducible on exam, denying respiratory urinary and abdominal symptoms low suspicion related, declined Toradol IM, prescribed prednisone  and baclofen and recommended supportive care through [facility] and advised follow-up as needed Final Clinical Impressions(s) / UC Diagnoses   Final diagnoses:  Acute left-sided thoracic back pain     Discharge Instructions      Your pain is most likely caused by irritation to the muscles.  Begin prednisone  every morning with food as directed to reduce inflammation and help with pain, may take Tylenol  or any topical medicines additionally  May use muscle relaxant at bedtime as needed for additional comfort to help you rest  You may use heating pad in 15 minute intervals as needed for additional comfort  Begin stretching affected area daily for 10  minutes as tolerated to further loosen muscles   When lying down place pillow underneath and between knees for support  Can try sleeping without pillow on firm mattress   Practice good posture: head back, shoulders back, chest forward, pelvis back and weight distributed evenly on both  legs  If pain persist after recommended treatment or reoccurs if may be beneficial to follow up with orthopedic specialist for evaluation, this doctor specializes in the bones and can manage your symptoms long-term with options such as but not limited to imaging, medications or physical therapy      ED Prescriptions     Medication Sig Dispense Auth. Provider   predniSONE  (STERAPRED UNI-PAK 21 TAB) 10 MG (21) TBPK tablet Take by mouth daily. Take 6 tabs by mouth daily  for 1 days, then 5 tabs for 1 days, then 4 tabs for 1 days, then 3 tabs for 1 days, 2 tabs for 1 days, then 1 tab by mouth daily for 1 days 21 tablet Darren Nodal R, NP   Baclofen 5 MG TABS Take 1 tablet (5 mg total) by mouth daily. 10 tablet Maurina Fawaz R, NP      PDMP not reviewed this encounter.   Teresa Shelba SAUNDERS, NP 01/09/24 1432

## 2024-01-09 NOTE — ED Triage Notes (Signed)
 Pt presents with left side back pain near her rib cage x 3 days. Pt states the pain started 4 weeks ago and went away. She has tried Tylenol  for the pain.

## 2024-01-25 ENCOUNTER — Telehealth: Payer: Self-pay

## 2024-01-25 DIAGNOSIS — R7303 Prediabetes: Secondary | ICD-10-CM

## 2024-01-25 DIAGNOSIS — I10 Essential (primary) hypertension: Secondary | ICD-10-CM

## 2024-01-25 DIAGNOSIS — D509 Iron deficiency anemia, unspecified: Secondary | ICD-10-CM

## 2024-01-25 NOTE — Telephone Encounter (Signed)
 Copied from CRM [id number]. Topic: General - Other >> Jan 25, 2024  8:18 AM Burnard DEL wrote: Reason for CRM: Patient called in stating that provider wanted her to come in around July to have a repeat labs. I do not see any lab orders placed,or any documentation of this? Please advise.

## 2024-01-25 NOTE — Addendum Note (Signed)
 Addended by: MARYLYNN VERNEITA CROME on: 01/25/2024 05:15 PM   Modules accepted: Orders

## 2024-01-25 NOTE — Telephone Encounter (Signed)
 I do not see in the chart where pt was advised to come back in July for blood work. If blood work is needed please let me know and I can get pt scheduled for a lab appt.

## 2024-01-26 ENCOUNTER — Encounter

## 2024-01-26 ENCOUNTER — Ambulatory Visit: Admitting: *Deleted

## 2024-01-26 VITALS — Ht 64.0 in | Wt 230.0 lb

## 2024-01-26 DIAGNOSIS — Z0000 Encounter for general adult medical examination without abnormal findings: Secondary | ICD-10-CM | POA: Diagnosis not present

## 2024-01-26 NOTE — Telephone Encounter (Signed)
 Please schedule pt for a fasting lab appt some time this month.

## 2024-01-26 NOTE — Telephone Encounter (Signed)
 noted

## 2024-01-26 NOTE — Progress Notes (Signed)
 Subjective:   Cheryl Coleman is a 68 y.o. who presents for a Medicare Wellness preventive visit.  As a reminder, Annual Wellness Visits don't include a physical exam, and some assessments may be limited, especially if this visit is performed virtually. We may recommend an in-person follow-up visit with your provider if needed.  Visit Complete: Virtual I connected with  Cheryl Coleman on 01/26/24 by a audio enabled telemedicine application and verified that I am speaking with the correct person using two identifiers.  Patient Location: Home  Provider Location: Home Office  I discussed the limitations of evaluation and management by telemedicine. The patient expressed understanding and agreed to proceed.  Vital Signs: Because this visit was a virtual/telehealth visit, some criteria may be missing or patient reported. Any vitals not documented were not able to be obtained and vitals that have been documented are patient reported.  VideoDeclined- This patient declined Librarian, academic. Therefore the visit was completed with audio only.  Persons Participating in Visit: Patient.  AWV Questionnaire: No: Patient Medicare AWV questionnaire was not completed prior to this visit.  Cardiac Risk Factors include: advanced age (>9men, >82 women);dyslipidemia;hypertension;obesity (BMI >30kg/m2)     Objective:    Today's Vitals   01/26/24 1546  Weight: 230 lb (104.3 kg)  Height: 5' 4 (1.626 m)   Body mass index is 39.48 kg/m.     01/26/2024    4:00 PM 07/03/2023   12:13 PM 05/13/2023   12:05 PM 04/20/2022    9:12 AM 04/06/2022    8:36 AM 04/05/2022    3:43 PM 04/03/2022    8:40 AM  Advanced Directives  Does Patient Have a Medical Advance Directive? No No No No No  No  Would patient like information on creating a medical advance directive? No - Patient declined    No - Patient declined No - Patient declined No - Patient declined    Current Medications  (verified) Outpatient Encounter Medications as of 01/26/2024  Medication Sig   albuterol  (VENTOLIN  HFA) 108 (90 Base) MCG/ACT inhaler INHALE 1-2 PUFFS BY MOUTH EVERY 6 (SIX) HOURS AS NEEDED   atorvastatin  (LIPITOR) 10 MG tablet Take 1 tablet (10 mg total) by mouth daily at 6 PM.   citalopram  (CELEXA ) 10 MG tablet Take 1 tablet (10 mg total) by mouth daily.   diltiazem  (CARDIZEM  CD) 120 MG 24 hr capsule Take 1 capsule (120 mg total) by mouth daily. TAKE IN THE MORNING   diltiazem  (CARDIZEM  CD) 240 MG 24 hr capsule Take 1 capsule (240 mg total) by mouth daily. TAKE IN THE EVENING (total 360 mg daily dose)   fluticasone  (FLONASE ) 50 MCG/ACT nasal spray SPRAY 2 SPRAYS INTO EACH NOSTRIL EVERY DAY   furosemide  (LASIX ) 20 MG tablet Take 0.5 tablets (10 mg total) by mouth daily as needed.   losartan  (COZAAR ) 50 MG tablet Take 1 tablet (50 mg total) by mouth daily. Needs appt with PCP for additional refills   montelukast  (SINGULAIR ) 10 MG tablet TAKE 1 TABLET BY MOUTH EVERYDAY AT BEDTIME   omeprazole  (PRILOSEC) 40 MG capsule Take 1 capsule (40 mg total) by mouth 2 (two) times daily.   Specialty Vitamins Products (WOMENS VITA PAK PO) Take by mouth daily.   Baclofen  5 MG TABS Take 1 tablet (5 mg total) by mouth daily. (Patient not taking: Reported on 01/26/2024)   nystatin  (MYCOSTATIN ) 100000 UNIT/ML suspension Take 5 mLs (500,000 Units total) by mouth 4 (four) times daily. Swish and spit four times  daily. (Patient not taking: Reported on 01/26/2024)   predniSONE  (STERAPRED UNI-PAK 21 TAB) 10 MG (21) TBPK tablet Take by mouth daily. Take 6 tabs by mouth daily  for 1 days, then 5 tabs for 1 days, then 4 tabs for 1 days, then 3 tabs for 1 days, 2 tabs for 1 days, then 1 tab by mouth daily for 1 days (Patient not taking: Reported on 01/26/2024)   No facility-administered encounter medications on file as of 01/26/2024.    Allergies (verified) Oxycodone   History: Past Medical History:  Diagnosis Date   Acute blood  loss anemia    Arthritis    Asthma    COVID    COVID-19    07/24/20, 05/19/21   Diverticulitis    Diverticulosis of colon with hemorrhage    GERD (gastroesophageal reflux disease)    GIB (gastrointestinal bleeding)    diverticular 01/23/22-01/24/22 [facility] s/p emobolization & 15 years ago in [place]   Hiatal hernia    History of blood transfusion    History of chicken pox    History of GI bleed    2013/14   Hyperlipidemia    Hypertension    Hypovolemic shock (HCC) 04/06/2022   Leukocytosis    Onychomycosis 11/03/2019   Otitis media, unspecified, right ear 01/25/2023   Type O blood, Rh positive    UTI due to Klebsiella species 01/20/2023   Past Surgical History:  Procedure Laterality Date   BREAST CYST ASPIRATION Right    CESAREAN SECTION     04/27/84   COLONOSCOPY     COLONOSCOPY WITH PROPOFOL  N/A 02/27/2022   Procedure: COLONOSCOPY WITH PROPOFOL ;  Surgeon: Unk Corinn Skiff, MD;  Location: [facility] ENDOSCOPY;  Service: Gastroenterology;  Laterality: N/A;   EMBOLIZATION (CATH LAB) N/A 01/23/2022   Procedure: EMBOLIZATION;  Surgeon: Jama Cordella MATSU, MD;  Location: [facility] INVASIVE CV LAB;  Service: Cardiovascular;  Laterality: N/A;   EMBOLIZATION (CATH LAB) N/A 04/06/2022   Procedure: EMBOLIZATION;  Surgeon: Marea Selinda RAMAN, MD;  Location: [facility] INVASIVE CV LAB;  Service: Cardiovascular;  Laterality: N/A;   ESOPHAGOGASTRODUODENOSCOPY (EGD) WITH PROPOFOL  N/A 08/29/2020   Procedure: ESOPHAGOGASTRODUODENOSCOPY (EGD) WITH PROPOFOL ;  Surgeon: Unk Corinn Skiff, MD;  Location: [facility] ENDOSCOPY;  Service: Gastroenterology;  Laterality: N/A;   ESOPHAGOGASTRODUODENOSCOPY (EGD) WITH PROPOFOL  N/A 05/13/2023   Procedure: ESOPHAGOGASTRODUODENOSCOPY (EGD) WITH PROPOFOL ;  Surgeon: Unk Corinn Skiff, MD;  Location: [facility] ENDOSCOPY;  Service: Endoscopy;  Laterality: N/A;   IR ANGIOGRAM VISCERAL SELECTIVE  04/05/2022   IR ANGIOGRAM VISCERAL SELECTIVE  04/05/2022   IR EMBO ART  VEN HEMORR LYMPH EXTRAV  INC GUIDE  ROADMAPPING  04/05/2022   IR US  GUIDE VASC ACCESS RIGHT  04/05/2022   ORIF ACETABULAR FRACTURE     Family History  Problem Relation Age of Onset   Hypertension Maternal Aunt    Diabetes Maternal Aunt    Breast cancer Neg Hx    Social History   Socioeconomic History   Marital status: Married    Spouse name: Not on file   Number of children: Not on file   Years of education: Not on file   Highest education level: Not on file  Occupational History   Not on file  Tobacco Use   Smoking status: Never   Smokeless tobacco: Never  Vaping Use   Vaping status: Never Used  Substance and Sexual Activity   Alcohol use: Not Currently   Drug use: Not Currently   Sexual activity: Yes    Comment: husband  Other Topics Concern   Not on file  Social History Narrative   Lived in [place] from [place]    Married 1st husband died in late 90s/2000s   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, wears seat belt, safe in relationship       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    Social Drivers of Corporate investment banker Strain: Low Risk  (01/26/2024)   Overall Financial Resource Strain (CARDIA)    Difficulty of Paying Living Expenses: Not hard at all  Food Insecurity: No Food Insecurity (01/26/2024)   Hunger Vital Sign    Worried About Running Out of Food in the Last Year: Never true    Ran Out of Food in the Last Year: Never true  Transportation Needs: No Transportation Needs (01/26/2024)   PRAPARE - Administrator, Civil Service (Medical): No    Lack of Transportation (Non-Medical): No  Physical Activity: Inactive (01/26/2024)   Exercise Vital Sign    Days of Exercise per Week: 0 days    Minutes of Exercise per Session: 0 min  Stress: No Stress Concern Present (01/26/2024)   [employer] of Occupational Health - Occupational Stress Questionnaire    Feeling of Stress: Only a little  Social Connections: Socially Integrated (01/26/2024)   Social Connection and  Isolation Panel    Frequency of Communication with Friends and Family: More than three times a week    Frequency of Social Gatherings with Friends and Family: More than three times a week    Attends Religious Services: More than 4 times per year    Active Member of [employer] or Organizations: Yes    Attends Engineer, structural: More than 4 times per year    Marital Status: Married    Tobacco Counseling Counseling given: Not Answered    Clinical Intake:  Pre-visit preparation completed: Yes  Pain : No/denies pain     BMI - recorded: 39.48 Nutritional Status: BMI > 30  Obese Nutritional Risks: None Diabetes: No  Lab Results  Component Value Date   HGBA1C 6.2 08/11/2023   HGBA1C 5.9 09/29/2022   HGBA1C 6.4 10/10/2021     How often do you need to have someone help you when you read instructions, pamphlets, or other written materials from your doctor or pharmacy?: 1 - Never  Interpreter Needed?: No  Information entered by :: R. Keiyon Plack LPN   Activities of Daily Living     01/26/2024    3:47 PM  In your present state of health, do you have any difficulty performing the following activities:  Hearing? 0  Vision? 0  Comment glasses  Difficulty concentrating or making decisions? 1  Comment at times  Walking or climbing stairs? 0  Dressing or bathing? 0  Doing errands, shopping? 0  Preparing Food and eating ? N  Using the Toilet? N  In the past six months, have you accidently leaked urine? Y  Do you have problems with loss of bowel control? N  Managing your Medications? N  Managing your Finances? N  Housekeeping or managing your Housekeeping? N    Patient Care Team: Marylynn Verneita CROME, MD as PCP - General (Internal Medicine) Darliss Rogue, MD as PCP - Cardiology (Cardiology) Melanee Annah BROCKS, MD as Consulting Physician (Hematology and Oncology) Unk Corinn Skiff, MD as Consulting Physician (Gastroenterology)  I have updated your Care Teams any recent  Medical  Services you may have received from other providers in the past year.     Assessment:   This is a routine wellness examination for [place].  Hearing/Vision screen Hearing Screening - Comments:: No issues Vision Screening - Comments:: glasses   Goals Addressed             This Visit's Progress    Patient Stated       Wants to some weight       Depression Screen     01/26/2024    3:56 PM 08/11/2023    9:32 AM 01/20/2023    1:44 PM 09/29/2022   10:11 AM 08/06/2022    8:34 AM 06/17/2022   11:36 AM 04/15/2022   10:10 AM  PHQ 2/9 Scores  PHQ - 2 Score 0 0 0 0 0 0 0  PHQ- 9 Score 0  0        Fall Risk     01/26/2024    3:51 PM 08/11/2023    9:32 AM 01/20/2023    1:44 PM 09/29/2022   10:11 AM 08/06/2022    8:34 AM  Fall Risk   Falls in the past year? 0 0 0 0 0  Number falls in past yr: 0 0 0 0 0  Injury with Fall? 0 0 0 0 0  Risk for fall due to : No Fall Risks No Fall Risks No Fall Risks No Fall Risks No Fall Risks  Follow up Falls evaluation completed;Falls prevention discussed Falls evaluation completed Falls evaluation completed Falls evaluation completed Falls evaluation completed      Data saved with a previous flowsheet row definition    MEDICARE RISK AT HOME:  Medicare Risk at Home Any stairs in or around the home?: No If so, are there any without handrails?: No Home free of loose throw rugs in walkways, pet beds, electrical cords, etc?: Yes Adequate lighting in your home to reduce risk of falls?: Yes Life alert?: No Use of a cane, walker or w/c?: No Grab bars in the bathroom?: No Shower chair or bench in shower?: No Elevated toilet seat or a handicapped toilet?: No  TIMED UP AND GO:  Was the test performed?  No  Cognitive Function: 6CIT completed        01/26/2024    4:00 PM  6CIT Screen  What Year? 0 points  What month? 0 points  What time? 0 points  Count back from 20 0 points  Months in reverse 0 points  Repeat phrase 0 points  Total Score  0 points    Immunizations Immunization History  Administered Date(s) Administered   Fluad Quad(high Dose 65+) 04/15/2022   Influenza,inj,Quad PF,6+ Mos 04/23/2018, 06/01/2019, 03/11/2021   PFIZER Comirnaty(Gray Top)Covid-19 Tri-Sucrose Vaccine 03/11/2021   PFIZER(Purple Top)SARS-COV-2 Vaccination 11/22/2019, 12/13/2019   Pneumococcal Polysaccharide-23 06/21/2018   Tdap 08/20/2021    Screening Tests Health Maintenance  Topic Date Due   Zoster Vaccines- Shingrix (1 of 2) Never done   Pneumococcal Vaccine: 50+ Years (2 of 2 - PCV) 06/22/2019   Medicare Annual Wellness (AWV)  02/13/2023   COVID-19 Vaccine (4 - 2024-25 season) 03/21/2023   INFLUENZA VACCINE  02/18/2024   MAMMOGRAM  11/24/2024   DTaP/Tdap/Td (2 - Td or Tdap) 08/21/2031   Colonoscopy  02/28/2032   DEXA SCAN  Completed   Hepatitis C Screening  Completed   Hepatitis B Vaccines  Aged Out   HPV VACCINES  Aged Out   Meningococcal B Vaccine  Aged Out  Health Maintenance  Health Maintenance Due  Topic Date Due   Zoster Vaccines- Shingrix (1 of 2) Never done   Pneumococcal Vaccine: 50+ Years (2 of 2 - PCV) 06/22/2019   Medicare Annual Wellness (AWV)  02/13/2023   COVID-19 Vaccine (4 - 2024-25 season) 03/21/2023   Health Maintenance Items Addressed: Discussed the need to update pneumonia, covid and shingles vaccines.   Additional Screening:  Vision Screening: Recommended annual ophthalmology exams for early detection of glaucoma and other disorders of the eye. Up to date [facility] Would you like a referral to an eye doctor? No    Dental Screening: Recommended annual dental exams for proper oral hygiene  Community Resource Referral / Chronic Care Management: CRR required this visit?  No   CCM required this visit?  No   Plan:    I have personally reviewed and noted the following in the patient's chart:   Medical and social history Use of alcohol, tobacco or illicit drugs  Current medications and  supplements including opioid prescriptions. Patient is not currently taking opioid prescriptions. Functional ability and status Nutritional status Physical activity Advanced directives List of other physicians Hospitalizations, surgeries, and ER visits in previous 12 months Vitals Screenings to include cognitive, depression, and falls Referrals and appointments  In addition, I have reviewed and discussed with patient certain preventive protocols, quality metrics, and best practice recommendations. A written personalized care plan for preventive services as well as general preventive health recommendations were provided to patient.   Angeline Fredericks, LPN   2/[id number]/7974   After Visit Summary: (MyChart) Due to this being a telephonic visit, the after visit summary with patients personalized plan was offered to patient via MyChart   Notes: Nothing significant to report at this time.

## 2024-01-26 NOTE — Patient Instructions (Signed)
 Ms. Cheryl Coleman , Thank you for taking time out of your busy schedule to complete your Annual Wellness Visit with me. I enjoyed our conversation and look forward to speaking with you again next year. I, as well as your care team,  appreciate your ongoing commitment to your health goals. Please review the following plan we discussed and let me know if I can assist you in the future. Your Game plan/ To Do List    Referrals: If you haven't heard from the office you've been referred to, please reach out to them at the phone provided.  Remember to update your shingles, covid and pneumonia vaccines.  Follow up Visits: Next Medicare AWV with our clinical staff: 01/30/25 @ 3:40   Have you seen your provider in the last 6 months (3 months if uncontrolled diabetes)? Yes Next Office Visit with your provider: lab appointment 02/03/24 then will schedule follow-up   Clinician Recommendations:  Aim for 30 minutes of exercise or brisk walking, 6-8 glasses of water, and 5 servings of fruits and vegetables each day.       This is a list of the screening recommended for you and due dates:  Health Maintenance  Topic Date Due   Zoster (Shingles) Vaccine (1 of 2) Never done   Pneumococcal Vaccine for age over 35 (2 of 2 - PCV) 06/22/2019   COVID-19 Vaccine (4 - 2024-25 season) 03/21/2023   Flu Shot  02/18/2024   Mammogram  11/24/2024   Medicare Annual Wellness Visit  01/25/2025   DTaP/Tdap/Td vaccine (2 - Td or Tdap) 08/21/2031   Colon Cancer Screening  02/28/2032   DEXA scan (bone density measurement)  Completed   Hepatitis C Screening  Completed   Hepatitis B Vaccine  Aged Out   HPV Vaccine  Aged Out   Meningitis B Vaccine  Aged Out    Advanced directives: (ACP Link)Information on Advanced Care Planning can be found at [employer]  Print production planner Health Care Directives Advance Health Care Directives. [email or web address]  Advance Care Planning is important because it:  [x]  Makes sure you receive the  medical care that is consistent with your values, goals, and preferences  [x]  It provides guidance to your family and loved ones and reduces their decisional burden about whether or not they are making the right decisions based on your wishes.  Follow the link provided in your after visit summary or read over the paperwork we have mailed to you to help you started getting your Advance Directives in place. If you need assistance in completing these, please reach out to us  so that we can help you!

## 2024-02-02 ENCOUNTER — Encounter: Payer: Self-pay | Admitting: Oncology

## 2024-02-03 ENCOUNTER — Other Ambulatory Visit (INDEPENDENT_AMBULATORY_CARE_PROVIDER_SITE_OTHER): Payer: PRIVATE HEALTH INSURANCE

## 2024-02-03 ENCOUNTER — Other Ambulatory Visit: Payer: Self-pay | Admitting: Internal Medicine

## 2024-02-03 DIAGNOSIS — R7303 Prediabetes: Secondary | ICD-10-CM

## 2024-02-03 DIAGNOSIS — R7401 Elevation of levels of liver transaminase levels: Secondary | ICD-10-CM | POA: Diagnosis not present

## 2024-02-03 DIAGNOSIS — I10 Essential (primary) hypertension: Secondary | ICD-10-CM | POA: Diagnosis not present

## 2024-02-03 DIAGNOSIS — N390 Urinary tract infection, site not specified: Secondary | ICD-10-CM | POA: Diagnosis not present

## 2024-02-03 DIAGNOSIS — D509 Iron deficiency anemia, unspecified: Secondary | ICD-10-CM

## 2024-02-03 LAB — LIPID PANEL W/REFLEX DIRECT LDL
Cholesterol: 165 mg/dL
HDL: 68 mg/dL
LDL Cholesterol (Calc): 82 mg/dL
Non-HDL Cholesterol (Calc): 97 mg/dL
Total CHOL/HDL Ratio: 2.4 (calc)
Triglycerides: 71 mg/dL

## 2024-02-03 LAB — CBC WITH DIFFERENTIAL/PLATELET
Basophils Absolute: 0 K/uL (ref 0.0–0.1)
Basophils Relative: 0.3 % (ref 0.0–3.0)
Eosinophils Absolute: 0.2 K/uL (ref 0.0–0.7)
Eosinophils Relative: 2.6 % (ref 0.0–5.0)
HCT: 37.9 % (ref 36.0–46.0)
Hemoglobin: 13.2 g/dL (ref 12.0–15.0)
Lymphocytes Relative: 37.7 % (ref 12.0–46.0)
Lymphs Abs: 3.2 K/uL (ref 0.7–4.0)
MCHC: 34.7 g/dL (ref 30.0–36.0)
MCV: 79.4 fl (ref 78.0–100.0)
Monocytes Absolute: 0.6 K/uL (ref 0.1–1.0)
Monocytes Relative: 7.3 % (ref 3.0–12.0)
Neutro Abs: 4.4 K/uL (ref 1.4–7.7)
Neutrophils Relative %: 52.1 % (ref 43.0–77.0)
Platelets: 247 K/uL (ref 150.0–400.0)
RBC: 4.78 Mil/uL (ref 3.87–5.11)
RDW: 15.7 % — ABNORMAL HIGH (ref 11.5–15.5)
WBC: 8.5 K/uL (ref 4.0–10.5)

## 2024-02-03 LAB — HEMOGLOBIN A1C: Hgb A1c MFr Bld: 6.1 % (ref 4.6–6.5)

## 2024-02-03 LAB — URINALYSIS, ROUTINE W REFLEX MICROSCOPIC
Bilirubin Urine: NEGATIVE
Hgb urine dipstick: NEGATIVE
Ketones, ur: NEGATIVE
Leukocytes,Ua: NEGATIVE
Nitrite: NEGATIVE
Specific Gravity, Urine: 1.01 (ref 1.000–1.030)
Total Protein, Urine: NEGATIVE
Urine Glucose: NEGATIVE
Urobilinogen, UA: 0.2 (ref 0.0–1.0)
WBC, UA: NONE SEEN
pH: 6.5 (ref 5.0–8.0)

## 2024-02-03 LAB — COMPREHENSIVE METABOLIC PANEL WITH GFR
ALT: 12 U/L (ref 0–35)
AST: 16 U/L (ref 0–37)
Albumin: 4.3 g/dL (ref 3.5–5.2)
Alkaline Phosphatase: 65 U/L (ref 39–117)
BUN: 11 mg/dL (ref 6–23)
CO2: 29 meq/L (ref 19–32)
Calcium: 9.7 mg/dL (ref 8.4–10.5)
Chloride: 103 meq/L (ref 96–112)
Creatinine, Ser: 0.85 mg/dL (ref 0.40–1.20)
GFR: 69.85 mL/min
Glucose, Bld: 98 mg/dL (ref 70–99)
Potassium: 3.8 meq/L (ref 3.5–5.1)
Sodium: 139 meq/L (ref 135–145)
Total Bilirubin: 0.4 mg/dL (ref 0.2–1.2)
Total Protein: 8.1 g/dL (ref 6.0–8.3)

## 2024-02-04 ENCOUNTER — Ambulatory Visit: Attending: Cardiology | Admitting: Cardiology

## 2024-02-04 ENCOUNTER — Encounter: Payer: Self-pay | Admitting: Cardiology

## 2024-02-04 VITALS — BP 118/56 | HR 79 | Ht 64.0 in | Wt 233.8 lb

## 2024-02-04 DIAGNOSIS — I10 Essential (primary) hypertension: Secondary | ICD-10-CM | POA: Diagnosis not present

## 2024-02-04 DIAGNOSIS — I471 Supraventricular tachycardia, unspecified: Secondary | ICD-10-CM | POA: Diagnosis not present

## 2024-02-04 DIAGNOSIS — E7800 Pure hypercholesterolemia, unspecified: Secondary | ICD-10-CM | POA: Diagnosis not present

## 2024-02-04 DIAGNOSIS — R0602 Shortness of breath: Secondary | ICD-10-CM

## 2024-02-04 DIAGNOSIS — R072 Precordial pain: Secondary | ICD-10-CM

## 2024-02-04 MED ORDER — METOPROLOL TARTRATE 100 MG PO TABS: ORAL_TABLET | ORAL | 0 refills | Status: AC

## 2024-02-04 NOTE — Progress Notes (Signed)
 Cardiology Office Note:    Date:  02/04/2024   ID:  Cheryl Coleman, DOB May 02, 1956, MRN [id number]  PCP:  Marylynn Verneita CROME, MD  [facility] Cardiologist:  Redell Cave, MD  [facility] Electrophysiologist:  None   Referring MD: Marylynn Verneita CROME, MD   Chief Complaint  Patient presents with   Follow-up    3 month follow up visit. Patient states that she is experiencing shortness of breath from walking from the parking lot to the clinic. The patient states that she is not feeling good this morning. Meds reviewed.     History of Present Illness:    Cheryl Coleman is a 68 y.o. female with a hx of hypertension, hyperlipidemia, paroxysmal SVT, anxiety who presents for follow-up.  Previously seen due to shortness of breath.  Echo was obtained to evaluate any significant structural abnormalities.  She still endorses shortness of breath with exertion.  States not being very active.  Denies chest pain.  BP adequately controlled on current medications.  Edema well-controlled on as needed Lasix .   Prior notes Echocardiogram 08/2020 EF 60 to 65%, diastolic function normal. Cardiac monitor on 09/02/2020 paroxysmal SVT,   Past Medical History:  Diagnosis Date   Acute blood loss anemia    Arthritis    Asthma    COVID    COVID-19    07/24/20, 05/19/21   Diverticulitis    Diverticulosis of colon with hemorrhage    GERD (gastroesophageal reflux disease)    GIB (gastrointestinal bleeding)    diverticular 01/23/22-01/24/22 [facility] s/p emobolization & 15 years ago in [place]   Hiatal hernia    History of blood transfusion    History of chicken pox    History of GI bleed    2013/14   Hyperlipidemia    Hypertension    Hypovolemic shock (HCC) 04/06/2022   Leukocytosis    Onychomycosis 11/03/2019   Otitis media, unspecified, right ear 01/25/2023   Type O blood, Rh positive    UTI due to Klebsiella species 01/20/2023    Past Surgical History:  Procedure Laterality Date   BREAST CYST ASPIRATION Right     CESAREAN SECTION     04/27/84   COLONOSCOPY     COLONOSCOPY WITH PROPOFOL  N/A 02/27/2022   Procedure: COLONOSCOPY WITH PROPOFOL ;  Surgeon: Unk Corinn Skiff, MD;  Location: [facility] ENDOSCOPY;  Service: Gastroenterology;  Laterality: N/A;   EMBOLIZATION (CATH LAB) N/A 01/23/2022   Procedure: EMBOLIZATION;  Surgeon: Jama Cordella MATSU, MD;  Location: [facility] INVASIVE CV LAB;  Service: Cardiovascular;  Laterality: N/A;   EMBOLIZATION (CATH LAB) N/A 04/06/2022   Procedure: EMBOLIZATION;  Surgeon: Marea Selinda RAMAN, MD;  Location: [facility] INVASIVE CV LAB;  Service: Cardiovascular;  Laterality: N/A;   ESOPHAGOGASTRODUODENOSCOPY (EGD) WITH PROPOFOL  N/A 08/29/2020   Procedure: ESOPHAGOGASTRODUODENOSCOPY (EGD) WITH PROPOFOL ;  Surgeon: Unk Corinn Skiff, MD;  Location: [facility] ENDOSCOPY;  Service: Gastroenterology;  Laterality: N/A;   ESOPHAGOGASTRODUODENOSCOPY (EGD) WITH PROPOFOL  N/A 05/13/2023   Procedure: ESOPHAGOGASTRODUODENOSCOPY (EGD) WITH PROPOFOL ;  Surgeon: Unk Corinn Skiff, MD;  Location: [facility] ENDOSCOPY;  Service: Endoscopy;  Laterality: N/A;   IR ANGIOGRAM VISCERAL SELECTIVE  04/05/2022   IR ANGIOGRAM VISCERAL SELECTIVE  04/05/2022   IR EMBO ART  VEN HEMORR LYMPH EXTRAV  INC GUIDE ROADMAPPING  04/05/2022   IR US  GUIDE VASC ACCESS RIGHT  04/05/2022   ORIF ACETABULAR FRACTURE      Current Medications: Current Meds  Medication Sig   metoprolol tartrate (LOPRESSOR) 100 MG tablet TAKE 1 TABLET 2 HR  PRIOR TO CARDIAC PROCEDURE     Allergies:   Oxycodone   Social History   Socioeconomic History   Marital status: Married    Spouse name: Not on file   Number of children: Not on file   Years of education: Not on file   Highest education level: Not on file  Occupational History   Not on file  Tobacco Use   Smoking status: Never   Smokeless tobacco: Never  Vaping Use   Vaping status: Never Used  Substance and Sexual Activity   Alcohol use: Not Currently   Drug use: Not Currently   Sexual activity:  Yes    Comment: husband   Other Topics Concern   Not on file  Social History Narrative   Lived in [place] from [place]    Married 1st husband died in late 90s/2000s   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, wears seat belt, safe in relationship       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    Social Drivers of Corporate investment banker Strain: Low Risk  (01/26/2024)   Overall Financial Resource Strain (CARDIA)    Difficulty of Paying Living Expenses: Not hard at all  Food Insecurity: No Food Insecurity (01/26/2024)   Hunger Vital Sign    Worried About Running Out of Food in the Last Year: Never true    Ran Out of Food in the Last Year: Never true  Transportation Needs: No Transportation Needs (01/26/2024)   PRAPARE - Administrator, Civil Service (Medical): No    Lack of Transportation (Non-Medical): No  Physical Activity: Inactive (01/26/2024)   Exercise Vital Sign    Days of Exercise per Week: 0 days    Minutes of Exercise per Session: 0 min  Stress: No Stress Concern Present (01/26/2024)   [employer] of Occupational Health - Occupational Stress Questionnaire    Feeling of Stress: Only a little  Social Connections: Socially Integrated (01/26/2024)   Social Connection and Isolation Panel    Frequency of Communication with Friends and Family: More than three times a week    Frequency of Social Gatherings with Friends and Family: More than three times a week    Attends Religious Services: More than 4 times per year    Active Member of [employer] or Organizations: Yes    Attends Engineer, structural: More than 4 times per year    Marital Status: Married     Family History: The patient's family history includes Diabetes in her maternal aunt; Hypertension in her maternal aunt. There is no history of Breast cancer.  ROS:   Please see the history of present illness.     All other systems reviewed and are negative.  EKGs/Labs/Other Studies  Reviewed:    The following studies were reviewed today:   EKG Interpretation Date/Time:  Friday February 04 2024 08:30:40 EDT Ventricular Rate:  79 PR Interval:  138 QRS Duration:  74 QT Interval:  388 QTC Calculation: 444 R Axis:   -3  Text Interpretation: Normal sinus rhythm Possible Left atrial enlargement Minimal voltage criteria for LVH, may be normal variant ( R in aVL ) Septal infarct old Confirmed by Darliss Rogue ([id number]) on 02/04/2024 8:40:41 AM    Recent Labs: 08/11/2023: TSH 2.12 02/03/2024: ALT 12; BUN 11; Creatinine, Ser 0.85; Hemoglobin 13.2; Platelets 247.0; Potassium 3.8; Sodium 139  Recent Lipid Panel  Component Value Date/Time   CHOL 165 02/03/2024 0903   TRIG 71 02/03/2024 0903   HDL 68 02/03/2024 0903   CHOLHDL 2.4 02/03/2024 0903   VLDL 14.6 08/11/2023 1022   LDLCALC 82 02/03/2024 0903   LDLDIRECT 97.0 08/11/2023 1022     Risk Assessment/Calculations:      Physical Exam:    VS:  BP (!) 118/56   Pulse 79   Ht 5' 4 (1.626 m)   Wt 233 lb 12.8 oz (106.1 kg)   SpO2 98%   BMI 40.13 kg/m     Wt Readings from Last 3 Encounters:  02/04/24 233 lb 12.8 oz (106.1 kg)  01/26/24 230 lb (104.3 kg)  12/14/23 233 lb (105.7 kg)     GEN:  Well nourished, well developed in no acute distress HEENT: Normal NECK: No JVD; No carotid bruits CARDIAC: RRR, no murmurs, rubs, gallops RESPIRATORY:  Clear to auscultation without rales, wheezing or rhonchi  ABDOMEN: Soft, non-tender, non-distended MUSCULOSKELETAL:  No edema; No deformity  SKIN: Warm and dry NEUROLOGIC:  Alert and oriented x 3 PSYCHIATRIC:  Normal affect   ASSESSMENT:    1. Shortness of breath   2. Paroxysmal SVT (supraventricular tachycardia) (HCC)   3. Pure hypercholesterolemia   4. Primary hypertension   5. Precordial pain    PLAN:    In order of problems listed above:  Shortness of breath, echo 09/2023 EF 60 to 65%, impaired relaxation.  No significant structural abnormalities noted  on echo.  Could be an anginal equivalent, although obesity, deconditioning may be contributing.  Obtain coronary CTA.  Increased activity, weight loss advised. Paroxysmal SVT, occasional palpitations in the p.m. Cardizem  CD 240 in a.m., 120 in PM. Hyperlipidemia, cholesterol controlled, continue Lipitor 10 mg daily. Hypertension, BP controlled.  Continue losartan  50, Cardizem  360 mg as above.  Follow-up in 6 months, or earlier if significant abnormalities noted on coronary CT.   Medication Adjustments/Labs and Tests Ordered: Current medicines are reviewed at length with the patient today.  Concerns regarding medicines are outlined above.  Orders Placed This Encounter  Procedures   CT CORONARY MORPH W/CTA COR W/SCORE W/CA W/CM &/OR WO/CM   EKG 12-Lead   Meds ordered this encounter  Medications   metoprolol tartrate (LOPRESSOR) 100 MG tablet    Sig: TAKE 1 TABLET 2 HR PRIOR TO CARDIAC PROCEDURE    Dispense:  1 tablet    Refill:  0    Patient Instructions  Medication Instructions:  - take one 100 mg metoprolol 2 hours prior to cardiac CT  *If you need a refill on your cardiac medications before your next appointment, please call your pharmacy*  Lab Work: No labs ordered today  If you have labs (blood work) drawn today and your tests are completely normal, you will receive your results only by: MyChart Message (if you have MyChart) OR A paper copy in the mail If you have any lab test that is abnormal or we need to change your treatment, we will call you to review the results.  Testing/Procedures:   Your cardiac CT will be scheduled at:  [facility] [address] [place], [place] [address] [phone]  Please arrive 15 mins early for check-in and test prep.  There is spacious parking and easy access to the radiology department from the [facility] entrance. Please enter here and check-in with the desk attendant.    Please follow  these instructions carefully (unless otherwise directed):  An  IV will be required for this test and Nitroglycerin will be given.  Hold all erectile dysfunction medications at least 3 days (72 hrs) prior to test. (Ie viagra, cialis, sildenafil, tadalafil, etc)     On the Night Before the Test: Be sure to Drink plenty of water. Do not consume any caffeinated/decaffeinated beverages or chocolate 12 hours prior to your test. Do not take any antihistamines 12 hours prior to your test.  On the Day of the Test: Drink plenty of water until 1 hour prior to the test. Do not eat any food 1 hour prior to test. You may take your regular medications prior to the test.  Take metoprolol (Lopressor) two hours prior to test. Heart Rate Medication Recommendations for Cardiac CT  Resting HR < 50 bpm  No medication  Resting HR 50-60 bpm and BP >110/50 mmHG   Consider Metoprolol tartrate 25 mg PO 90-120 min prior to scan  Resting HR 60-65 bpm and BP >110/50 mmHG  Metoprolol tartrate 50 mg PO 90-120 minutes prior to scan   Resting HR > 65 bpm and BP >110/50 mmHG  Metoprolol tartrate 100 mg PO 90-120 minutes prior to scan  Consider Ivabradine 10-15 mg PO or a calcium  channel blocker for resting HR >60 bpm and contraindication to metoprolol tartrate  Consider Ivabradine 10-15 mg PO in combination with metoprolol tartrate for HR >80 bpm   If you take Furosemide /Hydrochlorothiazide/Spironolactone/Chlorthalidone, please HOLD on the morning of the test. Patients who wear a continuous glucose monitor MUST remove the device prior to scanning. FEMALES- please wear underwire-free bra if available, avoid dresses & tight clothing       After the Test: Drink plenty of water. After receiving IV contrast, you may experience a mild flushed feeling. This is normal. On occasion, you may experience a mild rash up to 24 hours after the test. This is not dangerous. If this occurs, you can take Benadryl  25 mg, Zyrtec,  Claritin, or Allegra and increase your fluid intake. (Patients taking Tikosyn should avoid Benadryl , and may take Zyrtec, Claritin, or Allegra) If you experience trouble breathing, this can be serious. If it is severe call 911 IMMEDIATELY. If it is mild, please call our office.  We will call to schedule your test 2-4 weeks out understanding that some insurance companies will need an authorization prior to the service being performed.   For more information and frequently asked questions, please visit our website : [email or web address]  For non-scheduling related questions, please contact the cardiac imaging nurse navigator should you have any questions/concerns: Cardiac Imaging Nurse Navigators Direct Office Dial: [phone]   For scheduling needs, including cancellations and rescheduling, please call [place], [phone].    Follow-Up: At [facility], you and your health needs are our priority.  As part of our continuing mission to provide you with exceptional heart care, our providers are all part of one team.  This team includes your primary Cardiologist (physician) and Advanced Practice Providers or APPs (Physician Assistants and Nurse Practitioners) who all work together to provide you with the care you need, when you need it.  Your next appointment:   6 month(s)  Provider:   You may see Redell Cave, MD or one of the following Advanced Practice Providers on your designated Care Team:   Lonni Meager, NP Lesley Maffucci, PA-C Bernardino Bring, PA-C Cadence [place], PA-C Tylene Lunch, NP Barnie Hila, NP    We recommend signing up for the patient portal called MyChart.  Sign up information  is provided on this After Visit Summary.  MyChart is used to connect with patients for Virtual Visits (Telemedicine).  Patients are able to view lab/test results, encounter notes, upcoming appointments, etc.  Non-urgent messages can be sent to your provider as well.    To learn more about what you can do with MyChart, go to [email or web address].          Signed, Redell Cave, MD  02/04/2024 10:14 AM    [facility]

## 2024-02-04 NOTE — Patient Instructions (Signed)
 Medication Instructions:  - take one 100 mg metoprolol 2 hours prior to cardiac CT  *If you need a refill on your cardiac medications before your next appointment, please call your pharmacy*  Lab Work: No labs ordered today  If you have labs (blood work) drawn today and your tests are completely normal, you will receive your results only by: MyChart Message (if you have MyChart) OR A paper copy in the mail If you have any lab test that is abnormal or we need to change your treatment, we will call you to review the results.  Testing/Procedures:   Your cardiac CT will be scheduled at:  [facility] [address] [place], [place] [address] [phone]  Please arrive 15 mins early for check-in and test prep.  There is spacious parking and easy access to the radiology department from the [facility] V[facility] entrance. Please enter here and check-in with the desk attendant.    Please follow these instructions carefully (unless otherwise directed):  An IV will be required for this test and Nitroglycerin will be given.  Hold all erectile dysfunction medications at least 3 days (72 hrs) prior to test. (Ie viagra, cialis, sildenafil, tadalafil, etc)     On the Night Before the Test: Be sure to Drink plenty of water. Do not consume any caffeinated/decaffeinated beverages or chocolate 12 hours prior to your test. Do not take any antihistamines 12 hours prior to your test.  On the Day of the Test: Drink plenty of water until 1 hour prior to the test. Do not eat any food 1 hour prior to test. You may take your regular medications prior to the test.  Take metoprolol (Lopressor) two hours prior to test. Heart Rate Medication Recommendations for Cardiac CT  Resting HR < 50 bpm  No medication  Resting HR 50-60 bpm and BP >110/50 mmHG   Consider Metoprolol tartrate 25 mg PO 90-120 min prior to scan  Resting HR 60-65 bpm and BP >110/50 mmHG  Metoprolol  tartrate 50 mg PO 90-120 minutes prior to scan   Resting HR > 65 bpm and BP >110/50 mmHG  Metoprolol tartrate 100 mg PO 90-120 minutes prior to scan  Consider Ivabradine 10-15 mg PO or a calcium  channel blocker for resting HR >60 bpm and contraindication to metoprolol tartrate  Consider Ivabradine 10-15 mg PO in combination with metoprolol tartrate for HR >80 bpm   If you take Furosemide /Hydrochlorothiazide/Spironolactone/Chlorthalidone, please HOLD on the morning of the test. Patients who wear a continuous glucose monitor MUST remove the device prior to scanning. FEMALES- please wear underwire-free bra if available, avoid dresses & tight clothing       After the Test: Drink plenty of water. After receiving IV contrast, you may experience a mild flushed feeling. This is normal. On occasion, you may experience a mild rash up to 24 hours after the test. This is not dangerous. If this occurs, you can take Benadryl  25 mg, Zyrtec, Claritin, or Allegra and increase your fluid intake. (Patients taking Tikosyn should avoid Benadryl , and may take Zyrtec, Claritin, or Allegra) If you experience trouble breathing, this can be serious. If it is severe call 911 IMMEDIATELY. If it is mild, please call our office.  We will call to schedule your test 2-4 weeks out understanding that some insurance companies will need an authorization prior to the service being performed.   For more information and frequently asked questions, please visit our website : [email or web address]  For non-scheduling  related questions, please contact the cardiac imaging nurse navigator should you have any questions/concerns: Cardiac Imaging Nurse Navigators Direct Office Dial: [phone]   For scheduling needs, including cancellations and rescheduling, please call [place], [phone].    Follow-Up: At [facility], you and your health needs are our priority.  As part of our continuing mission to provide  you with exceptional heart care, our providers are all part of one team.  This team includes your primary Cardiologist (physician) and Advanced Practice Providers or APPs (Physician Assistants and Nurse Practitioners) who all work together to provide you with the care you need, when you need it.  Your next appointment:   6 month(s)  Provider:   You may see Redell Cave, MD or one of the following Advanced Practice Providers on your designated Care Team:   Lonni Meager, NP Lesley Maffucci, PA-C Bernardino Bring, PA-C Cadence [place], PA-C Tylene Lunch, NP Barnie Hila, NP    We recommend signing up for the patient portal called MyChart.  Sign up information is provided on this After Visit Summary.  MyChart is used to connect with patients for Virtual Visits (Telemedicine).  Patients are able to view lab/test results, encounter notes, upcoming appointments, etc.  Non-urgent messages can be sent to your provider as well.   To learn more about what you can do with MyChart, go to [email or web address].

## 2024-02-06 ENCOUNTER — Ambulatory Visit: Payer: Self-pay | Admitting: Internal Medicine

## 2024-02-15 ENCOUNTER — Other Ambulatory Visit: Payer: PRIVATE HEALTH INSURANCE

## 2024-02-28 ENCOUNTER — Encounter (HOSPITAL_COMMUNITY): Payer: Self-pay

## 2024-03-02 ENCOUNTER — Ambulatory Visit: Admission: RE | Admit: 2024-03-02 | Source: Ambulatory Visit

## 2024-03-07 ENCOUNTER — Ambulatory Visit
Admission: RE | Admit: 2024-03-07 | Discharge: 2024-03-07 | Disposition: A | Discharge status: Home / Self Care | Attending: Emergency Medicine | Admitting: Emergency Medicine

## 2024-03-07 VITALS — BP 101/66 | HR 68 | Temp 97.8°F | Resp 18

## 2024-03-07 DIAGNOSIS — M5442 Lumbago with sciatica, left side: Secondary | ICD-10-CM

## 2024-03-07 DIAGNOSIS — R1032 Left lower quadrant pain: Secondary | ICD-10-CM

## 2024-03-07 LAB — POCT URINE DIPSTICK
Bilirubin, UA: NEGATIVE
Blood, UA: NEGATIVE
Glucose, UA: NEGATIVE mg/dL
Ketones, POC UA: NEGATIVE mg/dL
Leukocytes, UA: NEGATIVE
Nitrite, UA: NEGATIVE
Spec Grav, UA: 1.02
Urobilinogen, UA: 0.2 U/dL
pH, UA: 6

## 2024-03-07 MED ORDER — PREDNISONE 10 MG (21) PO TBPK: ORAL_TABLET | Freq: Every day | ORAL | 0 refills | Status: DC

## 2024-03-07 NOTE — Discharge Instructions (Addendum)
 Follow up with your primary care provider tomorrow.  Go to the emergency department if you have worsening symptoms.    Take the prednisone  as directed.

## 2024-03-07 NOTE — ED Provider Notes (Addendum)
 [name]    CSN: [id number] Arrival date & time: 03/07/24  1304      History   Chief Complaint Chief Complaint  Patient presents with   Groin Pain    Entered by patient    HPI Cheryl Coleman is a 68 y.o. female.  Patient presents with 3 day history of left groin pain which radiates to her left lower back which then radiates to her left thigh.  The pain is constant and dull with occasional sharp pains.  It is worse with certain movements, especially if she is lying down and has to move her legs.  It improves with rest and Tylenol .  She reports she has been having to walk up an incline at work in the last few weeks.  She denies fever, chills, nausea, vomiting, diarrhea, constipation, blood in stool, dysuria, hematuria, numbness, weakness, saddle anesthesia, loss of bowel/bladder control, vaginal discharge, pelvic pain.  Last bowel movement this morning which was normal.  Her medical history includes diverticulosis, GI bleed, hiatal hernia, GERD, chronic left hip pain, morbid obesity.    The history is provided by the patient and medical records.    Past Medical History:  Diagnosis Date   Acute blood loss anemia    Arthritis    Asthma    COVID    COVID-19    07/24/20, 05/19/21   Diverticulitis    Diverticulosis of colon with hemorrhage    GERD (gastroesophageal reflux disease)    GIB (gastrointestinal bleeding)    diverticular 01/23/22-01/24/22 [facility] s/p emobolization & 15 years ago in [place]   Hiatal hernia    History of blood transfusion    History of chicken pox    History of GI bleed    2013/14   Hyperlipidemia    Hypertension    Hypovolemic shock (HCC) 04/06/2022   Leukocytosis    Onychomycosis 11/03/2019   Otitis media, unspecified, right ear 01/25/2023   Type O blood, Rh positive    UTI due to Klebsiella species 01/20/2023    Patient Active Problem List   Diagnosis Date Noted   Chronic left hip pain 12/14/2023   Morbid obesity (HCC) 12/14/2023   Chronic GERD  05/13/2023   Edema of right lower leg due to venous stasis 01/25/2023   Eczema of external ear, bilateral 01/25/2023   History of iron  deficiency anemia 09/29/2022   Iron  deficiency anemia 07/06/2022   Hypomagnesemia    Sigmoid diverticulosis    External hemorrhoids    History of GI diverticular bleed 01/23/2022   Gallstones 10/24/2020   Osteopenia 05/09/2020   Encounter for preventive health examination 11/03/2019   Asthma 08/29/2019   Hiatal hernia 06/06/2019   Anxiety 11/25/2018   Palpitations 10/19/2018   Vitamin D  deficiency 10/06/2018   Prediabetes 10/06/2018   Essential hypertension 08/18/2018   Gastroesophageal reflux disease 08/18/2018   Hyperlipidemia 08/18/2018   Mild intermittent asthma 08/18/2018   Allergic rhinitis due to pollen 05/11/2014    Past Surgical History:  Procedure Laterality Date   BREAST CYST ASPIRATION Right    CESAREAN SECTION     04/27/84   COLONOSCOPY     COLONOSCOPY WITH PROPOFOL  N/A 02/27/2022   Procedure: COLONOSCOPY WITH PROPOFOL ;  Surgeon: Unk Corinn Skiff, MD;  Location: [facility] ENDOSCOPY;  Service: Gastroenterology;  Laterality: N/A;   EMBOLIZATION (CATH LAB) N/A 01/23/2022   Procedure: EMBOLIZATION;  Surgeon: Jama Cordella MATSU, MD;  Location: [facility] INVASIVE CV LAB;  Service: Cardiovascular;  Laterality: N/A;   EMBOLIZATION (CATH LAB)  N/A 04/06/2022   Procedure: EMBOLIZATION;  Surgeon: Marea Selinda RAMAN, MD;  Location: [facility] INVASIVE CV LAB;  Service: Cardiovascular;  Laterality: N/A;   ESOPHAGOGASTRODUODENOSCOPY (EGD) WITH PROPOFOL  N/A 08/29/2020   Procedure: ESOPHAGOGASTRODUODENOSCOPY (EGD) WITH PROPOFOL ;  Surgeon: Unk Corinn Skiff, MD;  Location: [facility] ENDOSCOPY;  Service: Gastroenterology;  Laterality: N/A;   ESOPHAGOGASTRODUODENOSCOPY (EGD) WITH PROPOFOL  N/A 05/13/2023   Procedure: ESOPHAGOGASTRODUODENOSCOPY (EGD) WITH PROPOFOL ;  Surgeon: Unk Corinn Skiff, MD;  Location: [facility] ENDOSCOPY;  Service: Endoscopy;  Laterality: N/A;   IR ANGIOGRAM  VISCERAL SELECTIVE  04/05/2022   IR ANGIOGRAM VISCERAL SELECTIVE  04/05/2022   IR EMBO ART  VEN HEMORR LYMPH EXTRAV  INC GUIDE ROADMAPPING  04/05/2022   IR US  GUIDE VASC ACCESS RIGHT  04/05/2022   ORIF ACETABULAR FRACTURE      OB History   No obstetric history on file.      Home Medications    Prior to Admission medications   Medication Sig Start Date End Date Taking? Authorizing Provider  predniSONE  (STERAPRED UNI-PAK 21 TAB) 10 MG (21) TBPK tablet Take by mouth daily. As directed 03/07/24  Yes Corlis Burnard DEL, NP  albuterol  (VENTOLIN  HFA) 108 (90 Base) MCG/ACT inhaler INHALE 1-2 PUFFS BY MOUTH EVERY 6 (SIX) HOURS AS NEEDED 02/02/23   Marylynn Verneita CROME, MD  atorvastatin  (LIPITOR) 10 MG tablet Take 1 tablet (10 mg total) by mouth daily at 6 PM. 08/11/23   Marylynn Verneita CROME, MD  Baclofen  5 MG TABS Take 1 tablet (5 mg total) by mouth daily. Patient not taking: Reported on 02/04/2024 01/09/24   Teresa Shelba SAUNDERS, NP  citalopram  (CELEXA ) 10 MG tablet Take 1 tablet (10 mg total) by mouth daily. 08/11/23   Marylynn Verneita CROME, MD  diltiazem  (CARDIZEM  CD) 120 MG 24 hr capsule Take 1 capsule (120 mg total) by mouth daily. TAKE IN THE MORNING 10/05/23 01/26/24  Darliss Rogue, MD  diltiazem  (CARDIZEM  CD) 240 MG 24 hr capsule Take 1 capsule (240 mg total) by mouth daily. TAKE IN THE EVENING (total 360 mg daily dose) 10/05/23 01/26/24  Darliss Rogue, MD  fluticasone  (FLONASE ) 50 MCG/ACT nasal spray SPRAY 2 SPRAYS INTO EACH NOSTRIL EVERY DAY 09/09/21   [name], [name], MD  furosemide  (LASIX ) 20 MG tablet Take 0.5 tablets (10 mg total) by mouth daily as needed. 08/18/23 01/26/24  Darliss Rogue, MD  losartan  (COZAAR ) 50 MG tablet Take 1 tablet (50 mg total) by mouth daily. Needs appt with PCP for additional refills 12/14/23   Marylynn Verneita CROME, MD  metoprolol  tartrate (LOPRESSOR ) 100 MG tablet TAKE 1 TABLET 2 HR PRIOR TO CARDIAC PROCEDURE 02/04/24   Darliss Rogue, MD  montelukast  (SINGULAIR ) 10 MG tablet  TAKE 1 TABLET BY MOUTH EVERYDAY AT BEDTIME 10/22/23   Marylynn Verneita CROME, MD  nystatin  (MYCOSTATIN ) 100000 UNIT/ML suspension Take 5 mLs (500,000 Units total) by mouth 4 (four) times daily. Swish and spit four times daily. Patient not taking: Reported on 02/04/2024 11/15/23   Kennyth Domino, FNP  omeprazole  (PRILOSEC) 40 MG capsule Take 1 capsule (40 mg total) by mouth 2 (two) times daily. 08/11/23   Marylynn Verneita CROME, MD  Specialty Vitamins Products (WOMENS VITA PAK PO) Take by mouth daily.    [provider]    Family History Family History  Problem Relation Age of Onset   Hypertension Maternal Aunt    Diabetes Maternal Aunt    Breast cancer Neg Hx     Social History Social History   Tobacco Use  Smoking status: Never   Smokeless tobacco: Never  Vaping Use   Vaping status: Never Used  Substance Use Topics   Alcohol use: Not Currently   Drug use: Not Currently     Allergies   Oxycodone   Review of Systems Review of Systems  Constitutional:  Negative for chills and fever.  Gastrointestinal:  Negative for abdominal pain, blood in stool, constipation, diarrhea, nausea and vomiting.  Genitourinary:  Negative for dysuria, flank pain, hematuria, pelvic pain and vaginal discharge.       Left groin pain.  Musculoskeletal:  Positive for back pain. Negative for gait problem and joint swelling.  Skin:  Negative for color change and rash.  Neurological:  Negative for weakness and numbness.     Physical Exam Triage Vital Signs ED Triage Vitals  Encounter Vitals Group     BP      Girls Systolic BP Percentile      Girls Diastolic BP Percentile      Boys Systolic BP Percentile      Boys Diastolic BP Percentile      Pulse      Resp      Temp      Temp src      SpO2      Weight      Height      Head Circumference      Peak Flow      Pain Score      Pain Loc      Pain Education      Exclude from Growth Chart    No data found.  Updated Vital Signs BP 101/66   Pulse  68   Temp 97.8 F (36.6 C)   Resp 18   SpO2 98%   Visual Acuity Right Eye Distance:   Left Eye Distance:   Bilateral Distance:    Right Eye Near:   Left Eye Near:    Bilateral Near:     Physical Exam Constitutional:      General: She is not in acute distress.    Appearance: She is obese.  HENT:     Mouth/Throat:     Mouth: Mucous membranes are moist.  Cardiovascular:     Rate and Rhythm: Normal rate and regular rhythm.  Pulmonary:     Effort: Pulmonary effort is normal. No respiratory distress.  Abdominal:     General: Bowel sounds are normal.     Palpations: Abdomen is soft.     Tenderness: There is no abdominal tenderness. There is no right CVA tenderness, left CVA tenderness, guarding or rebound.  Musculoskeletal:        General: No deformity. Normal range of motion.  Skin:    General: Skin is warm and dry.     Findings: No bruising, erythema, lesion or rash.  Neurological:     General: No focal deficit present.     Mental Status: She is alert.     Sensory: No sensory deficit.     Motor: No weakness.     Gait: Gait normal.      UC Treatments / Results  Labs (all labs ordered are listed, but only abnormal results are displayed) Labs Reviewed  POCT URINE DIPSTICK - Abnormal; Notable for the following components:      Result Value   Protein Ur, POC trace (*)    All other components within normal limits    EKG   Radiology No results found.  Procedures Procedures (including critical care time)  Medications Ordered in UC Medications - No data to display  Initial Impression / Assessment and Plan / UC Course  I have reviewed the triage vital signs and the nursing notes.  Pertinent labs & imaging results that were available during my care of the patient were reviewed by me and considered in my medical decision making (see chart for details).    Left groin pain, left low back pain with left-sided sciatica.  Afebrile and vital signs are stable.   Patient's pain is reproducible on exam today as she moves from lying to sitting position and as she lifts her left leg while supine.  Her abdomen is soft and nontender with good bowel sounds.  Treating today with 6-day prednisone  taper.  Instructed her to follow-up with her PCP tomorrow.  ED precautions discussed.  Education provided on side pain and sciatica.  She agrees to plan of care.  Final Clinical Impressions(s) / UC Diagnoses   Final diagnoses:  Groin pain, left  Acute left-sided low back pain with left-sided sciatica     Discharge Instructions      Follow up with your primary care provider tomorrow.  Go to the emergency department if you have worsening symptoms.    Take the prednisone  as directed.       ED Prescriptions     Medication Sig Dispense Auth. Provider   predniSONE  (STERAPRED UNI-PAK 21 TAB) 10 MG (21) TBPK tablet Take by mouth daily. As directed 21 tablet Corlis Burnard DEL, NP      I have reviewed the PDMP during this encounter.   Corlis Burnard DEL, NP 03/07/24 1401    Corlis Burnard DEL, NP 03/07/24 1401

## 2024-03-07 NOTE — ED Triage Notes (Signed)
 Patient to Urgent Care with complaints of left sided groin pain, radiates into her back and left hip. Burning pain when she walks. States she she lies down flat she experiences sharp pains. Difficult to change position.   Hx of similar symptoms in June. Symptoms resolved after taking prednisone . Returned gradually over the last week. Has been walking up an incline at work more.   Taking tylenol  w/ some relief.

## 2024-03-10 ENCOUNTER — Telehealth (HOSPITAL_COMMUNITY): Payer: Self-pay | Admitting: *Deleted

## 2024-03-10 NOTE — Telephone Encounter (Signed)
 Attempted to call patient regarding upcoming cardiac CT appointment. Left message on voicemail with name and callback number Sid Seats RN Navigator Cardiac Imaging [facility] [phone] Office

## 2024-03-13 ENCOUNTER — Ambulatory Visit
Admission: RE | Admit: 2024-03-13 | Discharge: 2024-03-13 | Disposition: A | Discharge status: Home / Self Care | Source: Ambulatory Visit | Attending: Cardiology | Admitting: Cardiology

## 2024-03-13 ENCOUNTER — Ambulatory Visit (HOSPITAL_BASED_OUTPATIENT_CLINIC_OR_DEPARTMENT_OTHER)
Admission: RE | Admit: 2024-03-13 | Discharge: 2024-03-13 | Disposition: A | Discharge status: Home / Self Care | Source: Ambulatory Visit

## 2024-03-13 ENCOUNTER — Other Ambulatory Visit: Payer: Self-pay

## 2024-03-13 DIAGNOSIS — R931 Abnormal findings on diagnostic imaging of heart and coronary circulation: Secondary | ICD-10-CM | POA: Insufficient documentation

## 2024-03-13 DIAGNOSIS — I2510 Atherosclerotic heart disease of native coronary artery without angina pectoris: Secondary | ICD-10-CM

## 2024-03-13 DIAGNOSIS — R072 Precordial pain: Secondary | ICD-10-CM | POA: Diagnosis not present

## 2024-03-13 MED ORDER — METOPROLOL TARTRATE 5 MG/5ML IV SOLN
10.0000 mg | Freq: Once | INTRAVENOUS | Status: DC | PRN
  Filled 2024-03-13: qty 10

## 2024-03-13 MED ORDER — DILTIAZEM HCL 25 MG/5ML IV SOLN
10.0000 mg | INTRAVENOUS | Status: DC | PRN
  Filled 2024-03-13: qty 5

## 2024-03-13 MED ORDER — NITROGLYCERIN 0.4 MG SL SUBL
0.8000 mg | SUBLINGUAL_TABLET | Freq: Once | SUBLINGUAL | Status: AC
  Administered 2024-03-13: 0.8 mg via SUBLINGUAL
  Filled 2024-03-13: qty 25

## 2024-03-13 MED ORDER — IOHEXOL 350 MG/ML SOLN
100.0000 mL | Freq: Once | INTRAVENOUS | Status: AC | PRN
  Administered 2024-03-13: 100 mL via INTRAVENOUS

## 2024-03-14 ENCOUNTER — Ambulatory Visit: Payer: Self-pay | Admitting: Cardiology

## 2024-03-15 ENCOUNTER — Other Ambulatory Visit: Payer: Self-pay

## 2024-03-15 DIAGNOSIS — E785 Hyperlipidemia, unspecified: Secondary | ICD-10-CM

## 2024-03-15 MED ORDER — ATORVASTATIN CALCIUM 20 MG PO TABS: 20.0000 mg | ORAL_TABLET | Freq: Every day | ORAL | 3 refills | Status: DC

## 2024-03-15 MED ORDER — ASPIRIN 81 MG PO TBEC: 81.0000 mg | DELAYED_RELEASE_TABLET | Freq: Every day | ORAL | Status: DC

## 2024-03-24 ENCOUNTER — Other Ambulatory Visit: Payer: Self-pay | Admitting: Internal Medicine

## 2024-03-24 ENCOUNTER — Other Ambulatory Visit: Payer: Self-pay

## 2024-03-24 DIAGNOSIS — I10 Essential (primary) hypertension: Secondary | ICD-10-CM

## 2024-04-16 ENCOUNTER — Other Ambulatory Visit: Payer: Self-pay | Admitting: Internal Medicine

## 2024-04-16 DIAGNOSIS — F419 Anxiety disorder, unspecified: Secondary | ICD-10-CM

## 2024-04-18 ENCOUNTER — Other Ambulatory Visit: Payer: Self-pay | Admitting: Internal Medicine

## 2024-04-18 DIAGNOSIS — I10 Essential (primary) hypertension: Secondary | ICD-10-CM

## 2024-05-23 ENCOUNTER — Encounter: Payer: Self-pay | Admitting: Oncology

## 2024-05-30 ENCOUNTER — Encounter: Payer: Self-pay | Admitting: Internal Medicine

## 2024-05-30 ENCOUNTER — Ambulatory Visit: Payer: Self-pay

## 2024-05-30 ENCOUNTER — Ambulatory Visit (INDEPENDENT_AMBULATORY_CARE_PROVIDER_SITE_OTHER): Admitting: Internal Medicine

## 2024-05-30 VITALS — BP 124/72 | HR 93 | Ht 64.0 in | Wt 228.6 lb

## 2024-05-30 DIAGNOSIS — F419 Anxiety disorder, unspecified: Secondary | ICD-10-CM

## 2024-05-30 DIAGNOSIS — I10 Essential (primary) hypertension: Secondary | ICD-10-CM

## 2024-05-30 MED ORDER — ALPRAZOLAM 0.25 MG PO TABS: 0.2500 mg | ORAL_TABLET | Freq: Every evening | ORAL | 1 refills | Status: AC | PRN

## 2024-05-30 MED ORDER — BUSPIRONE HCL 10 MG PO TABS: 10.0000 mg | ORAL_TABLET | Freq: Three times a day (TID) | ORAL | 1 refills | Status: DC

## 2024-05-30 MED ORDER — LOSARTAN POTASSIUM 50 MG PO TABS: 50.0000 mg | ORAL_TABLET | Freq: Every day | ORAL | 1 refills | Status: AC

## 2024-05-30 NOTE — Assessment & Plan Note (Addendum)
 Uncontrolled due to son's psychiatrac illness and hospitalization.  Last use of medication was 20 yrs ago ago ,  prescribed xanax  after husband's death .  Use it only at night because the lowest dose made her sleepy .  Still taking citalopram 

## 2024-05-30 NOTE — Progress Notes (Signed)
 Subjective:  Patient ID: Cheryl Coleman, female    DOB: [birth year]  Age: 68 y.o. MRN: [id number]  CC: The primary encounter diagnosis was Anxiety. A diagnosis of Essential hypertension was also pertinent to this visit.   HPI Cheryl Coleman presents for  Chief Complaint  Patient presents with   Anxiety   Uncontrolled anxiety: patient is tearful today: son was IVC'd on Saturday,  after growing progressively paranoid for several weeks  he is currently in an ER awaiting transfer to inpatient hospitalizaition for paranoia becucase he has been deemed to be a danger to self .  She has been unable to concentrate on her work and sleep and is requesting short term therapy  without sedation    Outpatient Medications Prior to Visit  Medication Sig Dispense Refill   albuterol  (VENTOLIN  HFA) 108 (90 Base) MCG/ACT inhaler INHALE 1-2 PUFFS BY MOUTH EVERY 6 (SIX) HOURS AS NEEDED 18 each 11   atorvastatin  (LIPITOR) 20 MG tablet Take 1 tablet (20 mg total) by mouth daily at 6 PM. 30 tablet 3   Baclofen  5 MG TABS Take 1 tablet (5 mg total) by mouth daily. 10 tablet 0   citalopram  (CELEXA ) 10 MG tablet TAKE 1 TABLET BY MOUTH EVERY DAY 90 tablet 3   diltiazem  (CARDIZEM  CD) 120 MG 24 hr capsule Take 1 capsule (120 mg total) by mouth daily. TAKE IN THE MORNING 90 capsule 3   diltiazem  (CARDIZEM  CD) 240 MG 24 hr capsule Take 1 capsule (240 mg total) by mouth daily. TAKE IN THE EVENING (total 360 mg daily dose) 90 capsule 3   fluticasone  (FLONASE ) 50 MCG/ACT nasal spray SPRAY 2 SPRAYS INTO EACH NOSTRIL EVERY DAY 48 mL 3   furosemide  (LASIX ) 20 MG tablet Take 0.5 tablets (10 mg total) by mouth daily as needed. 45 tablet 3   metoprolol  tartrate (LOPRESSOR ) 100 MG tablet TAKE 1 TABLET 2 HR PRIOR TO CARDIAC PROCEDURE 1 tablet 0   montelukast  (SINGULAIR ) 10 MG tablet TAKE 1 TABLET BY MOUTH EVERYDAY AT BEDTIME 90 tablet 3   omeprazole  (PRILOSEC) 40 MG capsule Take 1 capsule (40 mg total) by mouth 2 (two) times daily. 180  capsule 3   Specialty Vitamins Products (WOMENS VITA PAK PO) Take by mouth daily.     losartan  (COZAAR ) 50 MG tablet Take 1 tablet (50 mg total) by mouth daily. Needs appt with PCP for additional refills 90 tablet 1   nystatin  (MYCOSTATIN ) 100000 UNIT/ML suspension Take 5 mLs (500,000 Units total) by mouth 4 (four) times daily. Swish and spit four times daily. (Patient not taking: Reported on 02/04/2024) 473 mL 1   predniSONE  (STERAPRED UNI-PAK 21 TAB) 10 MG (21) TBPK tablet Take by mouth daily. As directed 21 tablet 0   No facility-administered medications prior to visit.    Review of Systems;  Patient denies headache, fevers, malaise, unintentional weight loss, skin rash, eye pain, sinus congestion and sinus pain, sore throat, dysphagia,  hemoptysis , cough, dyspnea, wheezing, chest pain, palpitations, orthopnea, edema, abdominal pain, nausea, melena, diarrhea, constipation, flank pain, dysuria, hematuria, urinary  Frequency, nocturia, numbness, tingling, seizures,  Focal weakness, Loss of consciousness,  Tremor, insomnia, depression, and suicidal ideation.      Objective:  BP 124/72   Pulse 93   Ht 5' 4 (1.626 m)   Wt 228 lb 9.6 oz (103.7 kg)   SpO2 97%   BMI 39.24 kg/m   BP Readings from Last 3 Encounters:  05/30/24 124/72  03/13/24 136/72  03/07/24 101/66    Wt Readings from Last 3 Encounters:  05/30/24 228 lb 9.6 oz (103.7 kg)  02/04/24 233 lb 12.8 oz (106.1 kg)  01/26/24 230 lb (104.3 kg)    Physical Exam Vitals reviewed.  Constitutional:      General: She is not in acute distress.    Appearance: Normal appearance. She is normal weight. She is not ill-appearing, toxic-appearing or diaphoretic.  HENT:     Head: Normocephalic.  Eyes:     General: No scleral icterus.       Right eye: No discharge.        Left eye: No discharge.     Conjunctiva/sclera: Conjunctivae normal.  Cardiovascular:     Rate and Rhythm: Normal rate and regular rhythm.     Heart sounds:  Normal heart sounds.  Pulmonary:     Effort: Pulmonary effort is normal. No respiratory distress.     Breath sounds: Normal breath sounds.  Musculoskeletal:        General: Normal range of motion.  Skin:    General: Skin is warm and dry.  Neurological:     General: No focal deficit present.     Mental Status: She is alert and oriented to person, place, and time. Mental status is at baseline.  Psychiatric:        Mood and Affect: Mood is anxious. Affect is tearful.        Speech: Speech normal.        Behavior: Behavior normal.        Thought Content: Thought content normal.        Cognition and Memory: Cognition and memory normal.        Judgment: Judgment normal.     Lab Results  Component Value Date   HGBA1C 6.1 02/03/2024   HGBA1C 6.2 08/11/2023   HGBA1C 5.9 09/29/2022    Lab Results  Component Value Date   CREATININE 0.85 02/03/2024   CREATININE 0.84 08/11/2023   CREATININE 0.87 10/20/2022    Lab Results  Component Value Date   WBC 8.5 02/03/2024   HGB 13.2 02/03/2024   HCT 37.9 02/03/2024   PLT 247.0 02/03/2024   GLUCOSE 98 02/03/2024   CHOL 165 02/03/2024   TRIG 71 02/03/2024   HDL 68 02/03/2024   LDLDIRECT 97.0 08/11/2023   LDLCALC 82 02/03/2024   ALT 12 02/03/2024   AST 16 02/03/2024   NA 139 02/03/2024   K 3.8 02/03/2024   CL 103 02/03/2024   CREATININE 0.85 02/03/2024   BUN 11 02/03/2024   CO2 29 02/03/2024   TSH 2.12 08/11/2023   HGBA1C 6.1 02/03/2024    CT CORONARY MORPH W/CTA COR W/SCORE W/CA W/CM &/OR WO/CM Addendum Date: 03/16/2024 ADDENDUM REPORT: 03/16/2024 01:17 EXAM: OVER-READ INTERPRETATION  CT CHEST The following report is an over-read performed by radiologist Dr. Suzen Dials of [facility] on 03/16/2024. This over-read does not include interpretation of cardiac or coronary anatomy or pathology. The coronary calcium  score/coronary CTA interpretation by the cardiologist is attached. COMPARISON:  None. FINDINGS:  Cardiovascular: There are no significant extracardiac vascular findings. Mediastinum/Nodes: There are no enlarged lymph nodes within the visualized mediastinum. Lungs/Pleura: There is no pleural effusion. The visualized lungs appear clear. Upper abdomen: No significant findings in the visualized upper abdomen. Musculoskeletal/Chest wall: No chest wall mass or suspicious osseous findings within the visualized chest. IMPRESSION: No significant extracardiac findings within the visualized chest. Electronically Signed   By: Suzen Dials HERO.D.  On: 03/16/2024 01:17   Result Date: 03/16/2024 CLINICAL DATA:  DOE, risk factors EXAM: Cardiac/Coronary  CTA TECHNIQUE: The patient was scanned on a Siemens Somatom scanner. : A prospective scan was triggered in the ascending thoracic aorta. Axial non-contrast 3 mm slices were carried out through the heart. The data set was analyzed on a dedicated work station and scored using the Agatson method. Gantry rotation speed was 66 msecs and collimation was .6 mm. 100mg  of metoprolol  and 0.8 mg of sl NTG was given. The 3D data set was reconstructed in 5% intervals of the 35-75 % of the R-R cycle. Diastolic phases were analyzed on a dedicated work station using MPR, MIP, and VRT modes. The patient received 100 cc of contrast. FINDINGS: Image quality: Average. Noise artifact is: Significant. Other artifacts: Misregistration. Coronary Arteries:  Normal coronary origin.  Right dominance. Left main: The left main is a large caliber vessel with a normal take off from the left coronary cusp that bifurcates to form a left anterior descending artery and a left circumflex artery. There is no plaque or stenosis. Left anterior descending artery: There is minimal stenosis (<25%) of the proximal LAD due to calcified plaque. There is minimal stenosis (<25%) in the mid-LAD due to calcified plaque. There is mild stenosis (25-49%) in the distal LAD due to non-calcified plaque. There is a single D1  branch with moderate stenosis (50-69%) in the proximal portion due to predominantly non-calcified plaque. Left circumflex artery: The LCX is non-dominant with mild stenosis (25-49%) in the mid-segment due to calcified plaque. The LCX gives off 2 patent obtuse marginal branches. There is mild stenosis (25-49%) of OM1 due to calcified plaque. Right coronary artery: The RCA is dominant with normal take off from the right coronary cusp. There is diffuse disease due to non-calcified plaque causing no more than minimal stenosis (<25%). The RCA terminates as a PDA and right posterolateral branch. There is minimal stenosis in the PLB (<25%). The PDA is too small to accurately evaluate. Right Atrium: Right atrial size is within normal limits. Right Ventricle: The right ventricular cavity is within normal limits. Left Atrium: Left atrial size is normal in size with no left atrial appendage filling defect. Left Ventricle: The ventricular cavity size is within normal limits. Pulmonary arteries: Normal in size. Pulmonary veins: Normal pulmonary venous drainage. Pericardium: Normal thickness without significant effusion or calcium  present. Cardiac valves: The aortic valve is trileaflet without significant calcification. The mitral valve is normal without significant calcification. Aorta: Normal caliber without significant disease. Mild calcifications of the right coronary and non-coronary STJs. Extra-cardiac findings: See attached radiology report for non-cardiac structures. IMPRESSION: 1. Coronary calcium  score of 336. This was 91st percentile for age-, sex, and race-matched controls. 2. Normal coronary origin with right dominance. 3. Moderate stenosis of D1 branch. 4. Mild stenosis of the distal LAD, mid-LCx, and OM1. RECOMMENDATIONS: CAD-RADS 3: Moderate stenosis. Consider symptom-guided anti-ischemic pharmacotherapy as well as risk factor modification per guideline directed care. Additional analysis with CT FFR will be  submitted. Caron Poser, MD Electronically Signed: By: Caron Poser On: 03/13/2024 17:57   CT CORONARY FRACTIONAL FLOW RESERVE FLUID ANALYSIS Result Date: 03/13/2024 EXAM: CT FFR analysis was performed on the original cardiac CTA dataset. Diagrammatic representation of the CT FFR analysis is provided in a separate PDF document in PACS. This dictation was created using the PDF document and an interactive 3D model of the results. The 3D model is not available in the EMR/PACS. INTERPRETATION: CT FFR provides  simultaneous calculation of pressure and flow across the entire coronary tree. For clinical decision making, CT FFR values should be obtained 1-2 cm distal to the lower border of each stenosis measured. Coronary CTA-related artifacts may impair the diagnostic accuracy of the original cardiac CTA and FFR CT results. *Due to the fact that CT FFR represents a mathematically-derived analysis, it is recommended that the results be interpreted as follows: 1. CT FFR >0.80: Low likelihood of hemodynamic significance. 2. CT FFR 0.76-0.80: Borderline likelihood of hemodynamic significance. 3. CT FFR =< 0.75: High likelihood of hemodynamic significance. *Coronary CT Angiography-derived Fractional Flow Reserve Testing in Patients with Stable Coronary Artery Disease: Recommendations on Interpretation and Reporting. Radiology: Cardiothoracic Imaging. 2019;1(5):e190050 FINDINGS: 1. D1: CT-FFR of 0.74 2. OM1: CT-FFR of 0.88 IMPRESSION: 1. CT FFR analysis suggests high likelihood of hemodynamic significance of D1 lesion. Caron Poser, MD Electronically Signed   By: Caron Poser   On: 03/13/2024 18:00    Assessment & Plan:  .Anxiety Assessment & Plan: Uncontrolled due to son's psychiatrac illness and hospitalization.  Last use of medication was 20 yrs ago ago ,  prescribed xanax  after husband's death .  Use it only at night because the lowest dose made her sleepy .  Still taking citalopram     Essential  hypertension -     Losartan  Potassium; Take 1 tablet (50 mg total) by mouth daily. Needs appt with PCP for additional refills  Dispense: 90 tablet; Refill: 1  Other orders -     busPIRone HCl; Take 1 tablet (10 mg total) by mouth 3 (three) times daily.  Dispense: 90 tablet; Refill: 1 -     ALPRAZolam ; Take 1 tablet (0.25 mg total) by mouth at bedtime as needed for anxiety.  Dispense: 30 tablet; Refill: 1   Follow-up: Return in about 3 months (around 08/30/2024) for anxiety.   Verneita LITTIE Kettering, MD

## 2024-05-30 NOTE — Patient Instructions (Signed)
 I will pray for God to protect Cheryl Coleman  during his time of treatment   I am prescribing buspirone to use during the day to manage your anxiety,  and low dose alprazolam  to use in the evening if needed

## 2024-05-30 NOTE — Telephone Encounter (Signed)
 Called [facility] and spoke with Karen---patient disconnected prior to warm transfer but this RN explained the patient's symptoms to Darice and she advised she would call the patient to get her scheduled at this time.  FYI Only or Action Required?: Action required by provider: request for appointment, clinical question for provider, and update on patient condition.  Patient was last seen in primary care on 12/14/2023 by Marylynn Verneita CROME, MD.  Called Nurse Triage reporting Anxiety.  Symptoms began Friday 05/26/2024.  Interventions attempted: Rest, hydration, or home remedies.  Symptoms are: gradually worsening.  Triage Disposition: See Physician Within 24 Hours  Patient/caregiver understands and will follow disposition?: Yes               Copied from CRM #[id number]. Topic: Clinical - Red Word Triage >> May 30, 2024 12:33 PM Franky GRADE wrote: Red Word that prompted transfer to Nurse Triage: Patient is experiencing depression, anxiety due to a life changing family concern that occurred on Friday. Patient did not want to disclose further but states the feelings are overwhelming. Reason for Disposition  Patient sounds very upset or troubled to the triager  Answer Assessment - Initial Assessment Questions 1. CONCERN: What happened that made you call today?     Life changing situation---son was admitted to a psychiatric facility 2. DEPRESSION SYMPTOM SCREENING: How are you feeling overall? (e.g., decreased energy, increased sleeping or difficulty sleeping, difficulty concentrating, feelings of sadness, guilt, hopelessness, or worthlessness)     Life changing situation---son was admitted to a psychiatric facility 3. RISK OF HARM - SUICIDAL IDEATION:  Do you ever have thoughts of hurting or killing yourself?  (e.g., yes, no, no but preoccupation with thoughts about death)     Denies 4. RISK OF HARM - HOMICIDAL IDEATION:  Do you ever have thoughts of hurting or killing someone else?   (e.g., yes, no, no but preoccupation with thoughts about death)     Denies 5. FUNCTIONAL IMPAIRMENT: How have things been going for you overall? Have you had more difficulty than usual doing your normal daily activities?  (e.g., better, same, worse; self-care, school, work, interactions)     Waking in middle of the night 6. SUPPORT: Who is with you now? Who do you live with? Do you have family or friends who you can talk to?      family  Answer Assessment - Initial Assessment Questions Patient states her son was admitted to a psychiatric facility Patient denies any thoughts about harming herself or others and just wants something to help her through this life changing event Patient is advised to call us  back if anything changes or with any further questions/concerns. Patient is advised that if anything worsens to go to the Emergency Room. Patient verbalized understanding.    1. CONCERN: What happened that made you call today?     Life changing situation---son was admitted to a psychiatric facility 2. DEPRESSION SYMPTOM SCREENING: How are you feeling overall? (e.g., decreased energy, increased sleeping or difficulty sleeping, difficulty concentrating, feelings of sadness, guilt, hopelessness, or worthlessness)     Life changing situation---son was admitted to a psychiatric facility 3. RISK OF HARM - SUICIDAL IDEATION:  Do you ever have thoughts of hurting or killing yourself?  (e.g., yes, no, no but preoccupation with thoughts about death)     Denies 4. RISK OF HARM - HOMICIDAL IDEATION:  Do you ever have thoughts of hurting or killing someone else?  (e.g., yes, no, no but preoccupation with thoughts about  death)     Denies 5. FUNCTIONAL IMPAIRMENT: How have things been going for you overall? Have you had more difficulty than usual doing your normal daily activities?  (e.g., better, same, worse; self-care, school, work, interactions)     Waking in middle of the night 6.  SUPPORT: Who is with you now? Who do you live with? Do you have family or friends who you can talk to?      family  Protocols used: Depression-A-AH, Anxiety and Panic Attack-A-AH

## 2024-06-02 ENCOUNTER — Encounter: Payer: Self-pay | Admitting: Internal Medicine

## 2024-06-21 ENCOUNTER — Other Ambulatory Visit: Payer: Self-pay | Admitting: Internal Medicine

## 2024-06-23 ENCOUNTER — Telehealth: Payer: Self-pay

## 2024-06-23 ENCOUNTER — Encounter: Payer: Self-pay | Admitting: Oncology

## 2024-06-23 ENCOUNTER — Other Ambulatory Visit (HOSPITAL_COMMUNITY): Payer: Self-pay

## 2024-06-23 ENCOUNTER — Other Ambulatory Visit: Payer: Self-pay | Admitting: Internal Medicine

## 2024-06-23 DIAGNOSIS — K219 Gastro-esophageal reflux disease without esophagitis: Secondary | ICD-10-CM

## 2024-06-23 NOTE — Telephone Encounter (Signed)
 PA needed for omeprazole  (PRILOSEC) 40 MG capsule   Please route responses to TULLO  clinical not to sender

## 2024-06-23 NOTE — Telephone Encounter (Signed)
 noted

## 2024-06-23 NOTE — Telephone Encounter (Signed)
 Pharmacy Patient Advocate Encounter   Received notification from Physician's Office that prior authorization for Omeprazole  40MG  dr capsules is required/requested.   Insurance verification completed.   The patient is insured through [facility].   Per test claim: PA required; PA submitted to above mentioned insurance via Latent Key/confirmation #/EOC [id number] Status is pending

## 2024-06-23 NOTE — Telephone Encounter (Signed)
 Message sent to PA team to start a PA

## 2024-06-23 NOTE — Telephone Encounter (Signed)
 PA request has been Started. New Encounter has been or will be created for follow up. For additional info see Pharmacy Prior Auth telephone encounter from 06/23/2024.

## 2024-06-25 ENCOUNTER — Other Ambulatory Visit: Payer: Self-pay | Admitting: Internal Medicine

## 2024-06-25 DIAGNOSIS — J4520 Mild intermittent asthma, uncomplicated: Secondary | ICD-10-CM

## 2024-06-28 ENCOUNTER — Other Ambulatory Visit (HOSPITAL_COMMUNITY): Payer: Self-pay

## 2024-06-29 ENCOUNTER — Other Ambulatory Visit (HOSPITAL_COMMUNITY): Payer: Self-pay

## 2024-07-03 ENCOUNTER — Other Ambulatory Visit (HOSPITAL_COMMUNITY): Payer: Self-pay

## 2024-07-03 NOTE — Telephone Encounter (Signed)
Attempted to call.  No answer.  Mailbox is full.

## 2024-07-03 NOTE — Telephone Encounter (Signed)
 Pharmacy Patient Advocate Encounter  Received notification from [employer] that Prior Authorization for Omeprazole  40MG  dr capsules  has been APPROVED from 06/28/2024 to 06/28/2025. Unable to obtain price due to refill too soon rejection, last fill date 06/28/2024 next available fill date 07/09/2024   PA #/Case ID/Reference #: [id number]

## 2024-07-04 NOTE — Telephone Encounter (Signed)
 Pt is aware and gave a verbal understanding.

## 2024-07-08 ENCOUNTER — Ambulatory Visit
Admission: EM | Admit: 2024-07-08 | Discharge: 2024-07-08 | Disposition: A | Discharge status: Home / Self Care | Attending: Emergency Medicine | Admitting: Emergency Medicine

## 2024-07-08 ENCOUNTER — Encounter: Payer: Self-pay | Admitting: Emergency Medicine

## 2024-07-08 DIAGNOSIS — R300 Dysuria: Secondary | ICD-10-CM | POA: Diagnosis not present

## 2024-07-08 LAB — POCT URINE DIPSTICK
Bilirubin, UA: NEGATIVE
Glucose, UA: NEGATIVE mg/dL
Ketones, POC UA: NEGATIVE mg/dL
Nitrite, UA: NEGATIVE
POC PROTEIN,UA: NEGATIVE
Spec Grav, UA: 1.01
Urobilinogen, UA: 0.2 U/dL
pH, UA: 6.5

## 2024-07-08 MED ORDER — SULFAMETHOXAZOLE-TRIMETHOPRIM 800-160 MG PO TABS: 1.0000 | ORAL_TABLET | Freq: Two times a day (BID) | ORAL | 0 refills | Status: AC

## 2024-07-08 NOTE — Discharge Instructions (Signed)
 Your urinalysis shows Cheryl Coleman blood cells but n o bacteria at this time , your urine will be sent to the lab to determine exactly which bacteria is present, if any changes need to be made to your medications you will be notified  Begin use of Bactrim  twice a day for 7 days   You may use over-the-counter Azo to help minimize your symptoms until antibiotic removes bacteria, this medication will turn your urine orange  Increase your fluid intake through use of water  As always practice good hygiene, wiping front to back and avoidance of scented vaginal products to prevent further irritation  If symptoms continue to persist after use of medication or recur please follow-up with urgent care or your primary doctor as needed

## 2024-07-08 NOTE — ED Triage Notes (Signed)
 Patient reports painful urination and urinary frequency x 1 week. Patient has been cranberry capsule and AZO with mild relief.

## 2024-07-08 NOTE — ED Provider Notes (Signed)
 " [name]    CSN: [id number] Arrival date & time: 07/08/24  0849      History   Chief Complaint Chief Complaint  Patient presents with   Urinary Frequency   Dysuria    HPI Cheryl Coleman is a 68 y.o. female.   Patient presents for evaluation of dysuria and urinary frequency present for 7 days.  Has had lower abdominal pressure and intermittent left lower side pain.  Endorses believes related to holding urine while at work due to limited bathroom breaks.  Denies presence of hematuria, fever or vaginal symptoms.  Has attempted use of cranberry juice and Azo.     Past Medical History:  Diagnosis Date   Acute blood loss anemia    Allergy oxycodone   Anxiety    Arthritis    Asthma    Clotting disorder    COVID    COVID-19    07/24/20, 05/19/21   Diverticulitis    Diverticulosis of colon with hemorrhage    GERD (gastroesophageal reflux disease)    GIB (gastrointestinal bleeding)    diverticular 01/23/22-01/24/22 [facility] s/p emobolization & 15 years ago in [place]   Hiatal hernia    History of blood transfusion    History of chicken pox    History of GI bleed    2013/14   Hyperlipidemia    Hypertension    Hypovolemic shock (HCC) 04/06/2022   Leukocytosis    Onychomycosis 11/03/2019   Otitis media, unspecified, right ear 01/25/2023   Type O blood, Rh positive    UTI due to Klebsiella species 01/20/2023    Patient Active Problem List   Diagnosis Date Noted   Chronic left hip pain 12/14/2023   Morbid obesity (HCC) 12/14/2023   Chronic GERD 05/13/2023   Edema of right lower leg due to venous stasis 01/25/2023   Eczema of external ear, bilateral 01/25/2023   History of iron  deficiency anemia 09/29/2022   Iron  deficiency anemia 07/06/2022   Hypomagnesemia    Sigmoid diverticulosis    External hemorrhoids    History of GI diverticular bleed 01/23/2022   Gallstones 10/24/2020   Osteopenia 05/09/2020   Encounter for preventive health examination 11/03/2019   Asthma  08/29/2019   Hiatal hernia 06/06/2019   Anxiety 11/25/2018   Palpitations 10/19/2018   Vitamin D  deficiency 10/06/2018   Prediabetes 10/06/2018   Essential hypertension 08/18/2018   Gastroesophageal reflux disease 08/18/2018   Hyperlipidemia 08/18/2018   Mild intermittent asthma 08/18/2018   Allergic rhinitis due to pollen 05/11/2014    Past Surgical History:  Procedure Laterality Date   BREAST CYST ASPIRATION Right    CESAREAN SECTION     04/27/84   COLONOSCOPY     COLONOSCOPY WITH PROPOFOL  N/A 02/27/2022   Procedure: COLONOSCOPY WITH PROPOFOL ;  Surgeon: Unk Corinn Skiff, MD;  Location: [facility] ENDOSCOPY;  Service: Gastroenterology;  Laterality: N/A;   EMBOLIZATION (CATH LAB) N/A 01/23/2022   Procedure: EMBOLIZATION;  Surgeon: Jama Cordella MATSU, MD;  Location: [facility] INVASIVE CV LAB;  Service: Cardiovascular;  Laterality: N/A;   EMBOLIZATION (CATH LAB) N/A 04/06/2022   Procedure: EMBOLIZATION;  Surgeon: Marea Selinda RAMAN, MD;  Location: [facility] INVAS[facility] CV LAB;  Service: Cardiovascular;  Laterality: N/A;   ESOPHAGOGASTRODUODENOSCOPY (EGD) WITH PROPOFOL  N/A 08/29/2020   Procedure: ESOPHAGOGASTRODUODENOSCOPY (EGD) WITH PROPOFOL ;  Surgeon: Unk Corinn Skiff, MD;  Location: [facility] ENDOSCOPY;  Service: Gastroenterology;  Laterality: N/A;   ESOPHAGOGASTRODUODENOSCOPY (EGD) WITH PROPOFOL  N/A 05/13/2023   Procedure: ESOPHAGOGASTRODUODENOSCOPY (EGD) WITH PROPOFOL ;  Surgeon: Unk Corinn  Jess, MD;  Location: [facility] ENDOSCOPY;  Service: Endoscopy;  Laterality: N/A;   IR ANGIOGRAM VISCERAL SELECTIVE  04/05/2022   IR ANGIOGRAM VISCERAL SELECTIVE  04/05/2022   IR EMBO ART  VEN HEMORR LYMPH EXTRAV  INC GUIDE ROADMAPPING  04/05/2022   IR US  GUIDE VASC ACCESS RIGHT  04/05/2022   ORIF ACETABULAR FRACTURE      OB History   No obstetric history on file.      Home Medications    Prior to Admission medications  Medication Sig Start Date End Date Taking? Authorizing Provider  albuterol  (VENTOLIN  HFA) 108 (90  Base) MCG/ACT inhaler INHALE 1-2 PUFFS BY MOUTH EVERY 6 (SIX) HOURS AS NEEDED 06/27/24   Marylynn Verneita CROME, MD  ALPRAZolam  (XANAX ) 0.25 MG tablet Take 1 tablet (0.25 mg total) by mouth at bedtime as needed for anxiety. 05/30/24   Marylynn Verneita CROME, MD  atorvastatin  (LIPITOR) 20 MG tablet Take 1 tablet (20 mg total) by mouth daily at 6 PM. 03/15/24   Darliss Rogue, MD  Baclofen  5 MG TABS Take 1 tablet (5 mg total) by mouth daily. 01/09/24   Teresa Shelba SAUNDERS, NP  busPIRone  (BUSPAR ) 10 MG tablet TAKE 1 TABLET BY MOUTH THREE TIMES A DAY 06/21/24   Marylynn Verneita CROME, MD  citalopram  (CELEXA ) 10 MG tablet TAKE 1 TABLET BY MOUTH EVERY DAY 04/18/24   Marylynn Verneita CROME, MD  diltiazem  (CARDIZEM  CD) 120 MG 24 hr capsule Take 1 capsule (120 mg total) by mouth daily. TAKE IN THE MORNING 10/05/23 05/30/24  Darliss Rogue, MD  diltiazem  (CARDIZEM  CD) 240 MG 24 hr capsule Take 1 capsule (240 mg total) by mouth daily. TAKE IN THE EVENING (total 360 mg daily dose) 10/05/23 05/30/24  Darliss Rogue, MD  fluticasone  (FLONASE ) 50 MCG/ACT nasal spray SPRAY 2 SPRAYS INTO EACH NOSTRIL EVERY DAY 09/09/21   [name], [name], MD  furosemide  (LASIX ) 20 MG tablet Take 0.5 tablets (10 mg total) by mouth daily as needed. 08/18/23 05/30/24  Darliss Rogue, MD  losartan  (COZAAR ) 50 MG tablet Take 1 tablet (50 mg total) by mouth daily. Needs appt with PCP for additional refills 05/30/24   Marylynn Verneita CROME, MD  metoprolol  tartrate (LOPRESSOR ) 100 MG tablet TAKE 1 TABLET 2 HR PRIOR TO CARDIAC PROCEDURE 02/04/24   Darliss Rogue, MD  montelukast  (SINGULAIR ) 10 MG tablet TAKE 1 TABLET BY MOUTH EVERYDAY AT BEDTIME 10/22/23   Marylynn Verneita CROME, MD  omeprazole  (PRILOSEC) 40 MG capsule Take 1 capsule (40 mg total) by mouth 2 (two) times daily. 08/11/23   Marylynn Verneita CROME, MD  Specialty Vitamins Products (WOMENS VITA PAK PO) Take by mouth daily.    [provider]    Family History Family History  Problem Relation Age of Onset    Hypertension Maternal Aunt    Diabetes Maternal Aunt    Breast cancer Neg Hx     Social History Social History[1]   Allergies   Oxycodone   Review of Systems Review of Systems  Genitourinary:  Positive for dysuria and frequency. Negative for decreased urine volume, difficulty urinating, dyspareunia, enuresis, flank pain, genital sores, hematuria, menstrual problem, pelvic pain, urgency, vaginal bleeding, vaginal discharge and vaginal pain.     Physical Exam Triage Vital Signs ED Triage Vitals  Encounter Vitals Group     BP 07/08/24 0930 139/78     Girls Systolic BP Percentile --      Girls Diastolic BP Percentile --      Boys Systolic BP Percentile --  Boys Diastolic BP Percentile --      Pulse Rate 07/08/24 0930 81     Resp 07/08/24 0930 20     Temp 07/08/24 0930 98.1 F (36.7 C)     Temp Source 07/08/24 0930 Oral     SpO2 07/08/24 0930 98 %     Weight --      Height --      Head Circumference --      Peak Flow --      Pain Score 07/08/24 0928 0     Pain Loc --      Pain Education --      Exclude from Growth Chart --    No data found.  Updated Vital Signs BP 139/78 (BP Location: Left Arm)   Pulse 81   Temp 98.1 F (36.7 C) (Oral)   Resp 20   SpO2 98%   Visual Acuity Right Eye Distance:   Left Eye Distance:   Bilateral Distance:    Right Eye Near:   Left Eye Near:    Bilateral Near:     Physical Exam Constitutional:      Appearance: Normal appearance.  Eyes:     Extraocular Movements: Extraocular movements intact.  Pulmonary:     Effort: Pulmonary effort is normal.  Abdominal:     Tenderness: There is abdominal tenderness in the suprapubic area. There is no right CVA tenderness or left CVA tenderness.  Neurological:     Mental Status: She is alert and oriented to person, place, and time.      UC Treatments / Results  Labs (all labs ordered are listed, but only abnormal results are displayed) Labs Reviewed  POCT URINE DIPSTICK -  Abnormal; Notable for the following components:      Result Value   Clarity, UA cloudy (*)    Blood, UA trace-intact (*)    Leukocytes, UA Small (1+) (*)    All other components within normal limits  URINE CULTURE    EKG   Radiology No results found.  Procedures Procedures (including critical care time)  Medications Ordered in UC Medications - No data to display  Initial Impression / Assessment and Plan / UC Course  I have reviewed the triage vital signs and the nursing notes.  Pertinent labs & imaging results that were available during my care of the patient were reviewed by me and considered in my medical decision making (see chart for details).  Dysuria  Urinalysis showing leukocytes, negative for nitrates, sent for culture, empirically placing on Bactrim  as she is symptomatic, compatible with E. coli and Klebsiella which were on prior urine cultures, recommend over-the-counter medications and nonpharmacological supportive care will follow-up with urgent care as Final Clinical Impressions(s) / UC Diagnoses   Final diagnoses:  Dysuria   Discharge Instructions   None    ED Prescriptions   None    PDMP not reviewed this encounter.     [1]  Social History Tobacco Use   Smoking status: Never   Smokeless tobacco: Never  Vaping Use   Vaping status: Never Used  Substance Use Topics   Alcohol use: Not Currently   Drug use: Not Currently     Teresa Shelba SAUNDERS, NP 07/08/24 9040  "

## 2024-07-10 ENCOUNTER — Ambulatory Visit (HOSPITAL_COMMUNITY): Payer: Self-pay

## 2024-07-10 ENCOUNTER — Encounter: Payer: Self-pay | Admitting: Emergency Medicine

## 2024-07-10 LAB — URINE CULTURE: Culture: >=100000 — AB

## 2024-07-26 ENCOUNTER — Ambulatory Visit: Payer: Medicare HMO | Admitting: Physician Assistant

## 2024-08-01 ENCOUNTER — Encounter: Payer: Self-pay | Admitting: Physician Assistant

## 2024-08-01 ENCOUNTER — Ambulatory Visit: Payer: Self-pay | Admitting: Physician Assistant

## 2024-08-01 NOTE — Progress Notes (Unsigned)
 "  Cardiology Office Note    Date:  08/01/2024   ID:  Cheryl Coleman, DOB 1955-12-27, MRN [id number]  PCP:  Marylynn Verneita CROME, MD  Cardiologist:  Redell Cave, MD  Electrophysiologist:  None   Chief Complaint: ***  History of Present Illness:   Cheryl Coleman is a 69 y.o. female with history of hypertension, hyperlipidemia, paroxysmal SVT, and anxiety who presents for***.    Patient was initially evaluated by Dr. Cave 07/2020 in the setting of palpitations and syncope.  ZIO monitor revealed predominantly sinus rhythm with paroxysmal SVT, associated with patient triggered events.  Echo showed EF 60 to 65% with no significant valvular abnormalities.  Syncope was felt to be likely vasovagal in etiology.  She was seen in follow-up 07/2023 reporting shortness of breath with recent chest x-ray showing pulmonary vascular redistribution.  She was started on Lasix  10 mg daily as needed.  She continued to experience palpitations and Cardizem  was increased to 360 mg daily.  Echo 09/2023 revealed EF 60 to 65% with G1 DD and no significant valvular abnormalities.   Patient was most recently seen in the cardiology office 02/04/2024.  She continued to endorse shortness of breath with exertion.  Edema was well-controlled with as needed Lasix .  Coronary CTA revealed calcium  score 336 which was 91st percentile for age and sex matched control with moderate stenosis of the D1 branch and mild stenosis of the distal LAD, mid LCx, and OM1.  CT FFR suggested high likelihood of hemodynamic significance of D1 lesion.  ***  Labs independently reviewed: 01/2024-Hgb 13.2, HCT 37.9, platelets 247, sodium 139, potassium 3.8, BUN 11, creatinine 0.85, normal LFTs, TC 165, HDL 68, TG 71, LDL 82  Objective   Past Medical History:  Diagnosis Date   Acute blood loss anemia    Allergy oxycodone   Anxiety    Arthritis    Asthma    Clotting disorder    COVID    COVID-19    07/24/20, 05/19/21   Diverticulitis     Diverticulosis of colon with hemorrhage    GERD (gastroesophageal reflux disease)    GIB (gastrointestinal bleeding)    diverticular 01/23/22-01/24/22 [facility] s/p emobolization & 15 years ago in [place]   Hiatal hernia    History of blood transfusion    History of chicken pox    History of GI bleed    2013/14   Hyperlipidemia    Hypertension    Hypovolemic shock (HCC) 04/06/2022   Leukocytosis    Onychomycosis 11/03/2019   Otitis media, unspecified, right ear 01/25/2023   Type O blood, Rh positive    UTI due to Klebsiella species 01/20/2023    Current Medications: Active Medications[1]  Allergies:   Oxycodone   Social History   Socioeconomic History   Marital status: Married    Spouse name: Not on file   Number of children: Not on file   Years of education: Not on file   Highest education level: Not on file  Occupational History   Not on file  Tobacco Use   Smoking status: Never   Smokeless tobacco: Never  Vaping Use   Vaping status: Never Used  Substance and Sexual Activity   Alcohol use: Not Currently   Drug use: Not Currently   Sexual activity: Yes    Comment: husband   Other Topics Concern   Not on file  Social History Narrative   ** Merged History Encounter **       Lived in [place]  from [place]  Married 1st husband died in late 93s/2000s 2 sons  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     Social Drivers of Health   Tobacco Use: Low Risk (07/08/2024)   Patient History    Smoking Tobacco Use: Never    Smokeless Tobacco Use: Never    Passive Exposure: Not on file  Financial Resource Strain: Low Risk (01/26/2024)   Overall Financial Resource Strain (CARDIA)    Difficulty of Paying Living Expenses: Not hard at all  Food Insecurity: No Food Insecurity (01/26/2024)   Epic    Worried About Programme Researcher, Broadcasting/film/video in the Last Year: Never true    Ran Out of Food in the Last Year: Never  true  Transportation Needs: No Transportation Needs (01/26/2024)   Epic    Lack of Transportation (Medical): No    Lack of Transportation (Non-Medical): No  Physical Activity: Inactive (01/26/2024)   Exercise Vital Sign    Days of Exercise per Week: 0 days    Minutes of Exercise per Session: 0 min  Stress: No Stress Concern Present (01/26/2024)   [employer] of Occupational Health - Occupational Stress Questionnaire    Feeling of Stress: Only a little  Social Connections: Socially Integrated (01/26/2024)   Social Connection and Isolation Panel    Frequency of Communication with Friends and Family: More than three times a week    Frequency of Social Gatherings with Friends and Family: More than three times a week    Attends Religious Services: More than 4 times per year    Active Member of Clubs or Organizations: Yes    Attends Banker Meetings: More than 4 times per year    Marital Status: Married  Depression (PHQ2-9): Low Risk (05/30/2024)   Depression (PHQ2-9)    PHQ-2 Score: 4  Alcohol Screen: Low Risk (01/26/2024)   Alcohol Screen    Last Alcohol Screening Score (AUDIT): 0  Housing: Unknown (01/26/2024)   Epic    Unable to Pay for Housing in the Last Year: No    Number of Times Moved in the Last Year: Not on file    Homeless in the Last Year: No  Utilities: Not At Risk (01/26/2024)   Epic    Threatened with loss of utilities: No  Health Literacy: Adequate Health Literacy (01/26/2024)   B1300 Health Literacy    Frequency of need for help with medical instructions: Never     Family History:  The patient's family history includes Diabetes in her maternal aunt; Hypertension in her maternal aunt. There is no history of Breast cancer.  ROS:   12-point review of systems is negative unless otherwise noted in the HPI.  EKGs/Other Studies Reviewed:    Studies reviewed were summarized above. The additional studies were reviewed today:  02/2024 Coronary CTA 1. Coronary  calcium  score of 336. This was 91st percentile for age-, sex, and race-matched controls. 2. Normal coronary origin with right dominance. 3. Moderate stenosis of D1 branch. 4. Mild stenosis of the distal LAD, mid-LCx, and OM1.  1. CT FFR analysis suggests high likelihood of hemodynamic significance of D1 lesion.  09/2023 2D echo 1. Left ventricular ejection fraction, by estimation, is 60 to 65%. The  left ventricle has normal function. The left ventricle has no regional  wall motion abnormalities. Left ventricular diastolic parameters are  consistent with Grade I diastolic  dysfunction (  impaired relaxation). The average left ventricular global  longitudinal strain is -18.0 %. The global longitudinal strain is normal.   2. Right ventricular systolic function is normal. The right ventricular  size is normal. Tricuspid regurgitation signal is inadequate for assessing  PA pressure.   3. The mitral valve is normal in structure. No evidence of mitral valve  regurgitation. No evidence of mitral stenosis.   4. The aortic valve is normal in structure. Aortic valve regurgitation is  mild. No aortic stenosis is present.   5. The inferior vena cava is normal in size with greater than 50%  respiratory variability, suggesting right atrial pressure of 3 mmHg.   EKG:  EKG personally reviewed by me today    PHYSICAL EXAM:    VS:  There were no vitals taken for this visit.  BMI: There is no height or weight on file to calculate BMI.  GEN: Well nourished, well developed in no acute distress NECK: No JVD; No carotid bruits CARDIAC: ***RRR, no murmurs, rubs, gallops RESPIRATORY:  Clear to auscultation without rales, wheezing or rhonchi  ABDOMEN: Soft, non-tender, non-distended EXTREMITIES:  *** No edema; No deformity  Wt Readings from Last 3 Encounters:  05/30/24 228 lb 9.6 oz (103.7 kg)  02/04/24 233 lb 12.8 oz (106.1 kg)  01/26/24 230 lb (104.3 kg)                  ASSESSMENT & PLAN:    Shortness of breath   Paroxysmal SVT   Hyperlipidemia   Hypertension    {Are you ordering a CV Procedure (e.g. stress test, cath, DCCV, TEE, etc)?   Press F2        :[id number]}   Disposition: F/u with Dr. Darliss or an APP in ***.   Medication Adjustments/Labs and Tests Ordered: Current medicines are reviewed at length with the patient today.  Concerns regarding medicines are outlined above. Medication changes, Labs and Tests ordered today are summarized above and listed in the Patient Instructions accessible in Encounters.   Bonney Lesley Maffucci, PA-C 08/01/2024 1:30 PM     [facility] [place] [address] [place], [place] [address] [phone]      [1]  No outpatient medications have been marked as taking for the 08/03/24 encounter (Appointment) with Maffucci Lesley CROME, PA-C.   "

## 2024-08-03 ENCOUNTER — Ambulatory Visit: Admitting: Physician Assistant

## 2024-08-13 ENCOUNTER — Other Ambulatory Visit: Payer: Self-pay | Admitting: Internal Medicine

## 2024-08-17 ENCOUNTER — Other Ambulatory Visit: Payer: Self-pay | Admitting: Cardiology

## 2024-08-17 DIAGNOSIS — E785 Hyperlipidemia, unspecified: Secondary | ICD-10-CM

## 2024-09-12 ENCOUNTER — Ambulatory Visit: Admitting: Cardiology

## 2025-01-30 ENCOUNTER — Ambulatory Visit: Payer: PRIVATE HEALTH INSURANCE
